# Patient Record
Sex: Female | Born: 1940 | Race: White | Hispanic: No | Smoking: Current some day smoker
Health system: Southern US, Community
[De-identification: ages and names within clinical notes are randomized; demographics above are authoritative.]

## PROBLEM LIST (undated history)

## (undated) DIAGNOSIS — I10 Essential (primary) hypertension: Secondary | ICD-10-CM

## (undated) DIAGNOSIS — G8929 Other chronic pain: Secondary | ICD-10-CM

## (undated) DIAGNOSIS — A0472 Enterocolitis due to Clostridium difficile, not specified as recurrent: Secondary | ICD-10-CM

## (undated) DIAGNOSIS — Z87891 Personal history of nicotine dependence: Secondary | ICD-10-CM

## (undated) DIAGNOSIS — R4182 Altered mental status, unspecified: Secondary | ICD-10-CM

## (undated) DIAGNOSIS — D649 Anemia, unspecified: Secondary | ICD-10-CM

## (undated) DIAGNOSIS — E785 Hyperlipidemia, unspecified: Secondary | ICD-10-CM

## (undated) DIAGNOSIS — K5792 Diverticulitis of intestine, part unspecified, without perforation or abscess without bleeding: Secondary | ICD-10-CM

## (undated) DIAGNOSIS — Z9889 Other specified postprocedural states: Secondary | ICD-10-CM

## (undated) DIAGNOSIS — Z8659 Personal history of other mental and behavioral disorders: Secondary | ICD-10-CM

## (undated) DIAGNOSIS — K219 Gastro-esophageal reflux disease without esophagitis: Secondary | ICD-10-CM

## (undated) DIAGNOSIS — Z8669 Personal history of other diseases of the nervous system and sense organs: Secondary | ICD-10-CM

## (undated) DIAGNOSIS — I2510 Atherosclerotic heart disease of native coronary artery without angina pectoris: Secondary | ICD-10-CM

## (undated) DIAGNOSIS — M545 Low back pain, unspecified: Secondary | ICD-10-CM

## (undated) DIAGNOSIS — M549 Dorsalgia, unspecified: Secondary | ICD-10-CM

## (undated) DIAGNOSIS — Z8781 Personal history of (healed) traumatic fracture: Secondary | ICD-10-CM

## (undated) DIAGNOSIS — M76899 Other specified enthesopathies of unspecified lower limb, excluding foot: Secondary | ICD-10-CM

## (undated) DIAGNOSIS — G629 Polyneuropathy, unspecified: Secondary | ICD-10-CM

## (undated) DIAGNOSIS — N182 Chronic kidney disease, stage 2 (mild): Secondary | ICD-10-CM

## (undated) DIAGNOSIS — F419 Anxiety disorder, unspecified: Secondary | ICD-10-CM

## (undated) DIAGNOSIS — E559 Vitamin D deficiency, unspecified: Secondary | ICD-10-CM

## (undated) DIAGNOSIS — Z9289 Personal history of other medical treatment: Secondary | ICD-10-CM

## (undated) DIAGNOSIS — Q782 Osteopetrosis: Secondary | ICD-10-CM

## (undated) HISTORY — PX: CHOLECYSTECTOMY: SHX55

## (undated) HISTORY — DX: Personal history of other medical treatment: Z92.89

## (undated) HISTORY — DX: Personal history of nicotine dependence: Z87.891

---

## 2004-07-19 ENCOUNTER — Inpatient Hospital Stay: Payer: Self-pay | Admitting: Anesthesiology

## 2004-07-19 ENCOUNTER — Other Ambulatory Visit: Payer: Self-pay

## 2004-08-20 ENCOUNTER — Emergency Department: Payer: Self-pay | Admitting: General Practice

## 2005-12-04 ENCOUNTER — Emergency Department: Payer: Self-pay | Admitting: Emergency Medicine

## 2007-03-04 ENCOUNTER — Emergency Department: Payer: Self-pay | Admitting: Emergency Medicine

## 2007-09-04 ENCOUNTER — Emergency Department: Payer: Self-pay | Admitting: Emergency Medicine

## 2008-01-28 ENCOUNTER — Ambulatory Visit: Payer: Self-pay | Admitting: Family Medicine

## 2008-02-24 ENCOUNTER — Ambulatory Visit: Payer: Self-pay | Admitting: Family Medicine

## 2008-02-25 ENCOUNTER — Ambulatory Visit: Payer: Self-pay | Admitting: Family Medicine

## 2008-05-04 ENCOUNTER — Ambulatory Visit: Payer: Self-pay | Admitting: Gastroenterology

## 2008-08-22 ENCOUNTER — Emergency Department: Payer: Self-pay | Admitting: Emergency Medicine

## 2008-11-01 ENCOUNTER — Emergency Department: Payer: Self-pay | Admitting: Emergency Medicine

## 2008-12-05 ENCOUNTER — Emergency Department: Payer: Self-pay | Admitting: Internal Medicine

## 2009-11-15 ENCOUNTER — Ambulatory Visit: Payer: Self-pay | Admitting: Unknown Physician Specialty

## 2010-08-01 ENCOUNTER — Ambulatory Visit: Payer: Self-pay | Admitting: Unknown Physician Specialty

## 2010-08-04 ENCOUNTER — Ambulatory Visit: Payer: Self-pay | Admitting: Physical Medicine and Rehabilitation

## 2010-11-07 ENCOUNTER — Other Ambulatory Visit: Payer: Self-pay | Admitting: Unknown Physician Specialty

## 2010-12-04 ENCOUNTER — Ambulatory Visit: Payer: Self-pay | Admitting: Gastroenterology

## 2010-12-18 ENCOUNTER — Ambulatory Visit: Payer: Self-pay | Admitting: Gastroenterology

## 2011-03-31 ENCOUNTER — Emergency Department: Payer: Self-pay | Admitting: *Deleted

## 2012-01-30 ENCOUNTER — Observation Stay: Payer: Self-pay | Admitting: Internal Medicine

## 2012-01-30 LAB — URINALYSIS, COMPLETE
Bilirubin,UR: NEGATIVE
Glucose,UR: NEGATIVE mg/dL (ref 0–75)
Hyaline Cast: 8
Ketone: NEGATIVE
Ph: 5 (ref 4.5–8.0)
Protein: NEGATIVE
Specific Gravity: 1.005 (ref 1.003–1.030)
Squamous Epithelial: 4

## 2012-01-30 LAB — COMPREHENSIVE METABOLIC PANEL
Albumin: 3.5 g/dL (ref 3.4–5.0)
Alkaline Phosphatase: 199 U/L — ABNORMAL HIGH (ref 50–136)
Anion Gap: 6 — ABNORMAL LOW (ref 7–16)
BUN: 16 mg/dL (ref 7–18)
Bilirubin,Total: 0.2 mg/dL (ref 0.2–1.0)
Creatinine: 1.38 mg/dL — ABNORMAL HIGH (ref 0.60–1.30)
Glucose: 77 mg/dL (ref 65–99)
Osmolality: 279 (ref 275–301)
SGPT (ALT): 8 U/L — ABNORMAL LOW (ref 12–78)
Sodium: 140 mmol/L (ref 136–145)
Total Protein: 6.7 g/dL (ref 6.4–8.2)

## 2012-01-30 LAB — CBC WITH DIFFERENTIAL/PLATELET
Basophil #: 0 10*3/uL (ref 0.0–0.1)
Eosinophil #: 0.1 10*3/uL (ref 0.0–0.7)
HCT: 31.2 % — ABNORMAL LOW (ref 35.0–47.0)
HGB: 10.3 g/dL — ABNORMAL LOW (ref 12.0–16.0)
Lymphocyte #: 1 10*3/uL (ref 1.0–3.6)
Lymphocyte %: 20.4 %
MCV: 93 fL (ref 80–100)
Monocyte #: 0.4 x10 3/mm (ref 0.2–0.9)
Neutrophil #: 3.2 10*3/uL (ref 1.4–6.5)
Neutrophil %: 68.5 %
RBC: 3.36 10*6/uL — ABNORMAL LOW (ref 3.80–5.20)
RDW: 14.8 % — ABNORMAL HIGH (ref 11.5–14.5)
WBC: 4.7 10*3/uL (ref 3.6–11.0)

## 2012-01-30 LAB — TROPONIN I: Troponin-I: <0.02 ng/mL

## 2012-01-30 LAB — CK TOTAL AND CKMB (NOT AT ARMC): CK-MB: <0.5 ng/mL — ABNORMAL LOW (ref 0.5–3.6)

## 2012-01-31 LAB — CBC WITH DIFFERENTIAL/PLATELET
Eosinophil #: 0.1 10*3/uL (ref 0.0–0.7)
Eosinophil %: 1.7 %
Lymphocyte %: 26.2 %
MCH: 31.7 pg (ref 26.0–34.0)
Monocyte #: 0.4 x10 3/mm (ref 0.2–0.9)
Neutrophil %: 62.2 %
Platelet: 136 10*3/uL — ABNORMAL LOW (ref 150–440)
RBC: 3.14 10*6/uL — ABNORMAL LOW (ref 3.80–5.20)
WBC: 4.5 10*3/uL (ref 3.6–11.0)

## 2012-01-31 LAB — BASIC METABOLIC PANEL
Anion Gap: 5 — ABNORMAL LOW (ref 7–16)
BUN: 13 mg/dL (ref 7–18)
Calcium, Total: 8.7 mg/dL (ref 8.5–10.1)
Chloride: 110 mmol/L — ABNORMAL HIGH (ref 98–107)
Co2: 27 mmol/L (ref 21–32)
Glucose: 72 mg/dL (ref 65–99)
Osmolality: 282 (ref 275–301)
Potassium: 4.7 mmol/L (ref 3.5–5.1)
Sodium: 142 mmol/L (ref 136–145)

## 2012-01-31 LAB — LIPID PANEL
Cholesterol: 149 mg/dL (ref 0–200)
HDL Cholesterol: 58 mg/dL (ref 40–60)

## 2012-02-05 ENCOUNTER — Inpatient Hospital Stay: Payer: Self-pay | Admitting: Internal Medicine

## 2012-02-05 LAB — DIFFERENTIAL
Basophil #: 0.1 10*3/uL (ref 0.0–0.1)
Basophil %: 1.4 %
Eosinophil #: 0 10*3/uL (ref 0.0–0.7)
Lymphocyte #: 1.1 10*3/uL (ref 1.0–3.6)
Lymphocyte %: 19.2 %
Monocyte #: 0.4 x10 3/mm (ref 0.2–0.9)
Monocyte %: 6.5 %
Neutrophil #: 4.3 10*3/uL (ref 1.4–6.5)

## 2012-02-05 LAB — GLUCOSE, CSF: Glucose, CSF: 49 mg/dL (ref 40–75)

## 2012-02-05 LAB — CSF CELL COUNT WITH DIFFERENTIAL
CSF Tube #: 1
CSF Tube #: 3
Eosinophil: 0 %
Lymphocytes: 10 %
Lymphocytes: 19 %
Monocytes/Macrophages: 4 %
Monocytes/Macrophages: 6 %
Other Cells: 0 %
RBC (CSF): 10643 /mm3
WBC (CSF): 141 /mm3

## 2012-02-05 LAB — URINALYSIS, COMPLETE
Bilirubin,UR: NEGATIVE
Glucose,UR: NEGATIVE mg/dL (ref 0–75)
Hyaline Cast: 4
Nitrite: NEGATIVE
Ph: 6 (ref 4.5–8.0)
Protein: NEGATIVE
RBC,UR: 6 /HPF (ref 0–5)
Squamous Epithelial: NONE SEEN
WBC UR: 1 /HPF (ref 0–5)

## 2012-02-05 LAB — COMPREHENSIVE METABOLIC PANEL
Albumin: 3.6 g/dL (ref 3.4–5.0)
Alkaline Phosphatase: 198 U/L — ABNORMAL HIGH (ref 50–136)
Bilirubin,Total: 0.3 mg/dL (ref 0.2–1.0)
Calcium, Total: 9.5 mg/dL (ref 8.5–10.1)
Chloride: 108 mmol/L — ABNORMAL HIGH (ref 98–107)
Co2: 23 mmol/L (ref 21–32)
Creatinine: 1.24 mg/dL (ref 0.60–1.30)
EGFR (Non-African Amer.): 44 — ABNORMAL LOW
Osmolality: 287 (ref 275–301)
Potassium: 3.4 mmol/L — ABNORMAL LOW (ref 3.5–5.1)
SGPT (ALT): 8 U/L — ABNORMAL LOW (ref 12–78)
Sodium: 143 mmol/L (ref 136–145)

## 2012-02-05 LAB — CBC
HGB: 11.8 g/dL — ABNORMAL LOW (ref 12.0–16.0)
MCH: 30.5 pg (ref 26.0–34.0)
MCHC: 33.2 g/dL (ref 32.0–36.0)
Platelet: 207 10*3/uL (ref 150–440)
RBC: 3.87 10*6/uL (ref 3.80–5.20)
RDW: 14.7 % — ABNORMAL HIGH (ref 11.5–14.5)
WBC: 5.9 10*3/uL (ref 3.6–11.0)

## 2012-02-05 LAB — TROPONIN I: Troponin-I: <0.02 ng/mL

## 2012-02-05 LAB — PROTEIN, CSF: Protein, CSF: 103 mg/dL — ABNORMAL HIGH (ref 15–45)

## 2012-02-05 LAB — MAGNESIUM: Magnesium: 2.5 mg/dL — ABNORMAL HIGH

## 2012-02-06 LAB — CBC WITH DIFFERENTIAL/PLATELET
Basophil #: 0 10*3/uL (ref 0.0–0.1)
Eosinophil #: 0 10*3/uL (ref 0.0–0.7)
Lymphocyte %: 8.1 %
MCHC: 33 g/dL (ref 32.0–36.0)
Monocyte %: 2.4 %
Neutrophil %: 88.9 %
Platelet: 172 10*3/uL (ref 150–440)
RDW: 14.6 % — ABNORMAL HIGH (ref 11.5–14.5)
WBC: 3.4 10*3/uL — ABNORMAL LOW (ref 3.6–11.0)

## 2012-02-06 LAB — COMPREHENSIVE METABOLIC PANEL
Alkaline Phosphatase: 150 U/L — ABNORMAL HIGH (ref 50–136)
Anion Gap: 9 (ref 7–16)
Calcium, Total: 8.2 mg/dL — ABNORMAL LOW (ref 8.5–10.1)
Co2: 23 mmol/L (ref 21–32)
EGFR (Non-African Amer.): >60
Osmolality: 291 (ref 275–301)
Potassium: 4.2 mmol/L (ref 3.5–5.1)
Sodium: 144 mmol/L (ref 136–145)

## 2012-02-06 LAB — DRUG SCREEN, URINE
Barbiturates, Ur Screen: NEGATIVE (ref ?–200)
Benzodiazepine, Ur Scrn: NEGATIVE (ref ?–200)
Cannabinoid 50 Ng, Ur ~~LOC~~: NEGATIVE (ref ?–50)
Cocaine Metabolite,Ur ~~LOC~~: NEGATIVE (ref ?–300)
MDMA (Ecstasy)Ur Screen: NEGATIVE (ref ?–500)
Opiate, Ur Screen: NEGATIVE (ref ?–300)
Phencyclidine (PCP) Ur S: NEGATIVE (ref ?–25)

## 2012-02-08 ENCOUNTER — Inpatient Hospital Stay: Payer: Self-pay | Admitting: Student

## 2012-02-08 LAB — URINALYSIS, COMPLETE
Bacteria: NONE SEEN
Bilirubin,UR: NEGATIVE
Ph: 5 (ref 4.5–8.0)
Protein: NEGATIVE
RBC,UR: 2 /HPF (ref 0–5)
Specific Gravity: 1.01 (ref 1.003–1.030)
Squamous Epithelial: NONE SEEN

## 2012-02-08 LAB — COMPREHENSIVE METABOLIC PANEL
BUN: 11 mg/dL (ref 7–18)
Bilirubin,Total: 0.3 mg/dL (ref 0.2–1.0)
Chloride: 111 mmol/L — ABNORMAL HIGH (ref 98–107)
Co2: 24 mmol/L (ref 21–32)
EGFR (African American): >60
EGFR (Non-African Amer.): >60
Glucose: 106 mg/dL — ABNORMAL HIGH (ref 65–99)
SGOT(AST): 15 U/L (ref 15–37)
SGPT (ALT): 8 U/L — ABNORMAL LOW (ref 12–78)
Total Protein: 5.6 g/dL — ABNORMAL LOW (ref 6.4–8.2)

## 2012-02-08 LAB — CBC
HCT: 30.1 % — ABNORMAL LOW (ref 35.0–47.0)
MCH: 30.6 pg (ref 26.0–34.0)
MCHC: 33.5 g/dL (ref 32.0–36.0)
MCV: 92 fL (ref 80–100)
Platelet: 173 10*3/uL (ref 150–440)
RDW: 14.4 % (ref 11.5–14.5)
WBC: 6.7 10*3/uL (ref 3.6–11.0)

## 2012-02-08 LAB — TROPONIN I: Troponin-I: 0.04 ng/mL

## 2012-02-08 LAB — CSF CULTURE

## 2012-02-08 LAB — FOLATE: Folic Acid: 16.8 ng/mL (ref 3.1–100.0)

## 2012-02-09 ENCOUNTER — Ambulatory Visit: Payer: Self-pay | Admitting: Orthopaedic Surgery

## 2012-02-09 LAB — CBC WITH DIFFERENTIAL/PLATELET
Basophil %: 0.8 %
Eosinophil #: 0.1 10*3/uL (ref 0.0–0.7)
Eosinophil %: 1.3 %
HCT: 29.4 % — ABNORMAL LOW (ref 35.0–47.0)
HGB: 9.5 g/dL — ABNORMAL LOW (ref 12.0–16.0)
Lymphocyte %: 20 %
MCHC: 32.3 g/dL (ref 32.0–36.0)
Monocyte %: 9.7 %
Neutrophil #: 3.1 10*3/uL (ref 1.4–6.5)
RBC: 3.19 10*6/uL — ABNORMAL LOW (ref 3.80–5.20)
WBC: 4.5 10*3/uL (ref 3.6–11.0)

## 2012-02-09 LAB — COMPREHENSIVE METABOLIC PANEL
Albumin: 2.5 g/dL — ABNORMAL LOW (ref 3.4–5.0)
Alkaline Phosphatase: 135 U/L (ref 50–136)
Anion Gap: 7 (ref 7–16)
BUN: 8 mg/dL (ref 7–18)
Calcium, Total: 8.2 mg/dL — ABNORMAL LOW (ref 8.5–10.1)
Chloride: 109 mmol/L — ABNORMAL HIGH (ref 98–107)
Creatinine: 0.81 mg/dL (ref 0.60–1.30)
EGFR (African American): >60
Glucose: 104 mg/dL — ABNORMAL HIGH (ref 65–99)
Osmolality: 285 (ref 275–301)
Potassium: 3.5 mmol/L (ref 3.5–5.1)
SGOT(AST): 14 U/L — ABNORMAL LOW (ref 15–37)
SGPT (ALT): 7 U/L — ABNORMAL LOW (ref 12–78)
Sodium: 144 mmol/L (ref 136–145)
Total Protein: 5 g/dL — ABNORMAL LOW (ref 6.4–8.2)

## 2012-02-09 LAB — AMMONIA: Ammonia, Plasma: <25 mcmol/L (ref 11–32)

## 2012-02-10 LAB — RAPID HIV-1/2 QL/CONFIRM: HIV-1/2,Rapid Ql: NEGATIVE

## 2012-02-10 LAB — BASIC METABOLIC PANEL WITH GFR
Anion Gap: 7
BUN: 6 mg/dL — ABNORMAL LOW
Calcium, Total: 8.6 mg/dL
Chloride: 108 mmol/L — ABNORMAL HIGH
Co2: 28 mmol/L
Creatinine: 0.73 mg/dL
EGFR (African American): >60
EGFR (Non-African Amer.): >60
Glucose: 112 mg/dL — ABNORMAL HIGH
Osmolality: 283
Potassium: 3.5 mmol/L
Sodium: 143 mmol/L

## 2012-02-11 LAB — CBC WITH DIFFERENTIAL/PLATELET
Basophil #: 0.1 10*3/uL (ref 0.0–0.1)
Basophil %: 1.1 %
Eosinophil #: 0.1 10*3/uL (ref 0.0–0.7)
Eosinophil %: 0.9 %
HCT: 32.2 % — ABNORMAL LOW (ref 35.0–47.0)
HGB: 10.8 g/dL — ABNORMAL LOW (ref 12.0–16.0)
Lymphocyte #: 0.7 10*3/uL — ABNORMAL LOW (ref 1.0–3.6)
Lymphocyte %: 10.6 %
MCH: 31.2 pg (ref 26.0–34.0)
MCHC: 33.7 g/dL (ref 32.0–36.0)
MCV: 93 fL (ref 80–100)
Monocyte #: 0.6 x10 3/mm (ref 0.2–0.9)
Monocyte %: 10.2 %
Neutrophil #: 4.9 10*3/uL (ref 1.4–6.5)
Neutrophil %: 77.2 %
Platelet: 177 10*3/uL (ref 150–440)
RBC: 3.47 10*6/uL — ABNORMAL LOW (ref 3.80–5.20)
RDW: 14.2 % (ref 11.5–14.5)
WBC: 6.3 10*3/uL (ref 3.6–11.0)

## 2012-02-11 LAB — SEDIMENTATION RATE: Erythrocyte Sed Rate: 12 mm/hr (ref 0–30)

## 2012-02-12 LAB — CULTURE, BLOOD (SINGLE)

## 2012-02-14 LAB — CULTURE, BLOOD (SINGLE)

## 2012-07-20 ENCOUNTER — Emergency Department: Payer: Self-pay | Admitting: Emergency Medicine

## 2012-07-20 LAB — COMPREHENSIVE METABOLIC PANEL
Albumin: 3.7 g/dL (ref 3.4–5.0)
Anion Gap: 6 — ABNORMAL LOW (ref 7–16)
BUN: 21 mg/dL — ABNORMAL HIGH (ref 7–18)
Bilirubin,Total: 0.2 mg/dL (ref 0.2–1.0)
Co2: 21 mmol/L (ref 21–32)
Creatinine: 1.4 mg/dL — ABNORMAL HIGH (ref 0.60–1.30)
EGFR (African American): 44 — ABNORMAL LOW
Potassium: 3.8 mmol/L (ref 3.5–5.1)
SGPT (ALT): 14 U/L (ref 12–78)
Sodium: 144 mmol/L (ref 136–145)

## 2012-07-20 LAB — URINALYSIS, COMPLETE
Glucose,UR: NEGATIVE mg/dL (ref 0–75)
Leukocyte Esterase: NEGATIVE
Nitrite: NEGATIVE
RBC,UR: 2 /HPF (ref 0–5)
Specific Gravity: 1.021 (ref 1.003–1.030)
Squamous Epithelial: 1

## 2012-07-20 LAB — CBC
HCT: 32.4 % — ABNORMAL LOW (ref 35.0–47.0)
HGB: 10.5 g/dL — ABNORMAL LOW (ref 12.0–16.0)
MCH: 30.7 pg (ref 26.0–34.0)
Platelet: 162 10*3/uL (ref 150–440)
RBC: 3.43 10*6/uL — ABNORMAL LOW (ref 3.80–5.20)
RDW: 15.7 % — ABNORMAL HIGH (ref 11.5–14.5)
WBC: 7.9 10*3/uL (ref 3.6–11.0)

## 2012-07-20 LAB — TROPONIN I: Troponin-I: <0.02 ng/mL

## 2012-07-24 ENCOUNTER — Ambulatory Visit: Payer: Self-pay | Admitting: Gastroenterology

## 2012-08-12 ENCOUNTER — Inpatient Hospital Stay: Payer: Self-pay | Admitting: Internal Medicine

## 2012-08-12 LAB — COMPREHENSIVE METABOLIC PANEL
Albumin: 3.9 g/dL (ref 3.4–5.0)
Alkaline Phosphatase: 93 U/L (ref 50–136)
Anion Gap: 11 (ref 7–16)
Calcium, Total: 8.9 mg/dL (ref 8.5–10.1)
Chloride: 112 mmol/L — ABNORMAL HIGH (ref 98–107)
Creatinine: 1.86 mg/dL — ABNORMAL HIGH (ref 0.60–1.30)

## 2012-08-12 LAB — URINALYSIS, COMPLETE
Bacteria: NONE SEEN
Bilirubin,UR: NEGATIVE
Glucose,UR: NEGATIVE mg/dL (ref 0–75)
Hyaline Cast: 8
Nitrite: NEGATIVE
Protein: NEGATIVE
Specific Gravity: 1.02 (ref 1.003–1.030)

## 2012-08-12 LAB — DRUG SCREEN, URINE

## 2012-08-12 LAB — CBC
MCH: 28.3 pg (ref 26.0–34.0)
MCHC: 30.6 g/dL — ABNORMAL LOW (ref 32.0–36.0)
Platelet: 193 10*3/uL (ref 150–440)

## 2012-08-12 LAB — PROTIME-INR
INR: 1.1
Prothrombin Time: 14.1 secs (ref 11.5–14.7)

## 2012-08-12 LAB — ETHANOL: Ethanol %: <0.003 % (ref 0.000–0.080)

## 2012-08-12 LAB — APTT: Activated PTT: 26.9 secs (ref 23.6–35.9)

## 2012-08-13 LAB — CBC WITH DIFFERENTIAL/PLATELET
Eosinophil #: 0 10*3/uL (ref 0.0–0.7)
HCT: 29 % — ABNORMAL LOW (ref 35.0–47.0)
HGB: 9.4 g/dL — ABNORMAL LOW (ref 12.0–16.0)
Lymphocyte %: 21 %
MCH: 29.9 pg (ref 26.0–34.0)
MCHC: 32.2 g/dL (ref 32.0–36.0)
Monocyte #: 0.5 x10 3/mm (ref 0.2–0.9)
Monocyte %: 10.7 %
Neutrophil #: 3 10*3/uL (ref 1.4–6.5)
Platelet: 150 10*3/uL (ref 150–440)
RBC: 3.13 10*6/uL — ABNORMAL LOW (ref 3.80–5.20)
RDW: 15.5 % — ABNORMAL HIGH (ref 11.5–14.5)
WBC: 4.5 10*3/uL (ref 3.6–11.0)

## 2012-08-13 LAB — BASIC METABOLIC PANEL
Anion Gap: 11 (ref 7–16)
BUN: 44 mg/dL — ABNORMAL HIGH (ref 7–18)
Co2: 18 mmol/L — ABNORMAL LOW (ref 21–32)
Creatinine: 1.01 mg/dL (ref 0.60–1.30)
EGFR (Non-African Amer.): 57 — ABNORMAL LOW
Sodium: 146 mmol/L — ABNORMAL HIGH (ref 136–145)

## 2012-08-14 LAB — CBC WITH DIFFERENTIAL/PLATELET
Eosinophil #: 0.1 10*3/uL (ref 0.0–0.7)
Eosinophil %: 1.1 %
HCT: 28.4 % — ABNORMAL LOW (ref 35.0–47.0)
HGB: 9.3 g/dL — ABNORMAL LOW (ref 12.0–16.0)
Lymphocyte %: 22.5 %
MCH: 30.2 pg (ref 26.0–34.0)
MCHC: 32.8 g/dL (ref 32.0–36.0)
Monocyte #: 0.5 x10 3/mm (ref 0.2–0.9)
Neutrophil #: 3.3 10*3/uL (ref 1.4–6.5)
Platelet: 142 10*3/uL — ABNORMAL LOW (ref 150–440)
RBC: 3.08 10*6/uL — ABNORMAL LOW (ref 3.80–5.20)
RDW: 15.2 % — ABNORMAL HIGH (ref 11.5–14.5)
WBC: 5.1 10*3/uL (ref 3.6–11.0)

## 2012-08-14 LAB — BASIC METABOLIC PANEL
Anion Gap: 7 (ref 7–16)
Calcium, Total: 8 mg/dL — ABNORMAL LOW (ref 8.5–10.1)
EGFR (African American): >60
Osmolality: 282 (ref 275–301)
Sodium: 140 mmol/L (ref 136–145)

## 2012-08-15 LAB — FOLATE: Folic Acid: 17.3 ng/mL (ref 3.1–100.0)

## 2012-08-17 LAB — FERRITIN: Ferritin (ARMC): 3 ng/mL — ABNORMAL LOW (ref 8–388)

## 2012-08-17 LAB — IRON AND TIBC
Iron Bind.Cap.(Total): 263 ug/dL (ref 250–450)
Iron Saturation: 10 %

## 2012-08-17 LAB — URINE CULTURE

## 2012-08-18 LAB — CULTURE, BLOOD (SINGLE)

## 2012-09-30 ENCOUNTER — Ambulatory Visit: Payer: Self-pay | Admitting: Gastroenterology

## 2012-10-01 LAB — PATHOLOGY REPORT

## 2013-04-14 ENCOUNTER — Ambulatory Visit: Payer: Self-pay | Admitting: Physician Assistant

## 2013-10-06 ENCOUNTER — Emergency Department: Payer: Self-pay | Admitting: Emergency Medicine

## 2013-10-06 LAB — URINALYSIS, COMPLETE
BACTERIA: NONE SEEN
Bilirubin,UR: NEGATIVE
Glucose,UR: NEGATIVE mg/dL (ref 0–75)
Nitrite: NEGATIVE
PROTEIN: NEGATIVE
Ph: 5 (ref 4.5–8.0)
RBC,UR: 9 /HPF (ref 0–5)
Specific Gravity: 1.027 (ref 1.003–1.030)
Squamous Epithelial: 1
WBC UR: 2 /HPF (ref 0–5)

## 2013-10-06 LAB — CBC
HCT: 38.4 % (ref 35.0–47.0)
HGB: 12.5 g/dL (ref 12.0–16.0)
MCH: 31.1 pg (ref 26.0–34.0)
MCHC: 32.6 g/dL (ref 32.0–36.0)
MCV: 95 fL (ref 80–100)
Platelet: 202 10*3/uL (ref 150–440)
RBC: 4.02 10*6/uL (ref 3.80–5.20)
RDW: 14.9 % — AB (ref 11.5–14.5)
WBC: 6.4 10*3/uL (ref 3.6–11.0)

## 2013-10-06 LAB — COMPREHENSIVE METABOLIC PANEL
ALK PHOS: 122 U/L — AB
ANION GAP: 8 (ref 7–16)
Albumin: 3.9 g/dL (ref 3.4–5.0)
BUN: 27 mg/dL — ABNORMAL HIGH (ref 7–18)
Bilirubin,Total: 0.4 mg/dL (ref 0.2–1.0)
Calcium, Total: 8.9 mg/dL (ref 8.5–10.1)
Chloride: 117 mmol/L — ABNORMAL HIGH (ref 98–107)
Co2: 20 mmol/L — ABNORMAL LOW (ref 21–32)
Creatinine: 1.11 mg/dL (ref 0.60–1.30)
EGFR (African American): 58 — ABNORMAL LOW
EGFR (Non-African Amer.): 50 — ABNORMAL LOW
Glucose: 86 mg/dL (ref 65–99)
Osmolality: 293 (ref 275–301)
Potassium: 3.6 mmol/L (ref 3.5–5.1)
SGOT(AST): 17 U/L (ref 15–37)
SGPT (ALT): 8 U/L — ABNORMAL LOW (ref 12–78)
Sodium: 145 mmol/L (ref 136–145)
TOTAL PROTEIN: 7.3 g/dL (ref 6.4–8.2)

## 2013-10-06 LAB — TROPONIN I

## 2013-10-16 ENCOUNTER — Inpatient Hospital Stay: Payer: Self-pay | Admitting: Internal Medicine

## 2013-10-16 DIAGNOSIS — R4182 Altered mental status, unspecified: Secondary | ICD-10-CM | POA: Insufficient documentation

## 2013-10-16 LAB — COMPREHENSIVE METABOLIC PANEL
ALBUMIN: 3.7 g/dL (ref 3.4–5.0)
ANION GAP: 3 — AB (ref 7–16)
AST: 19 U/L (ref 15–37)
Alkaline Phosphatase: 96 U/L
BILIRUBIN TOTAL: 0.3 mg/dL (ref 0.2–1.0)
BUN: 24 mg/dL — ABNORMAL HIGH (ref 7–18)
CALCIUM: 8.6 mg/dL (ref 8.5–10.1)
Chloride: 125 mmol/L — ABNORMAL HIGH (ref 98–107)
Co2: 20 mmol/L — ABNORMAL LOW (ref 21–32)
Creatinine: 1.41 mg/dL — ABNORMAL HIGH (ref 0.60–1.30)
EGFR (African American): 44 — ABNORMAL LOW
EGFR (Non-African Amer.): 38 — ABNORMAL LOW
Glucose: 90 mg/dL (ref 65–99)
Osmolality: 298 (ref 275–301)
POTASSIUM: 4.2 mmol/L (ref 3.5–5.1)
SGPT (ALT): 7 U/L — ABNORMAL LOW (ref 12–78)
Sodium: 148 mmol/L — ABNORMAL HIGH (ref 136–145)
TOTAL PROTEIN: 6.6 g/dL (ref 6.4–8.2)

## 2013-10-16 LAB — URINALYSIS, COMPLETE
BILIRUBIN, UR: NEGATIVE
Bacteria: NONE SEEN
Glucose,UR: NEGATIVE mg/dL (ref 0–75)
NITRITE: NEGATIVE
PROTEIN: NEGATIVE
Ph: 5 (ref 4.5–8.0)
RBC,UR: 3 /HPF (ref 0–5)
SPECIFIC GRAVITY: 1.021 (ref 1.003–1.030)
Squamous Epithelial: <1
WBC UR: 1 /HPF (ref 0–5)

## 2013-10-16 LAB — DRUG SCREEN, URINE

## 2013-10-16 LAB — TSH: THYROID STIMULATING HORM: 0.21 u[IU]/mL — AB

## 2013-10-16 LAB — SALICYLATE LEVEL
Salicylates, Serum: 44 mg/dL
Salicylates, Serum: 33 mg/dL

## 2013-10-16 LAB — CBC
HCT: 37 % (ref 35.0–47.0)
HGB: 11.9 g/dL — AB (ref 12.0–16.0)
MCH: 31.2 pg (ref 26.0–34.0)
MCHC: 32.2 g/dL (ref 32.0–36.0)
MCV: 97 fL (ref 80–100)
PLATELETS: 138 10*3/uL — AB (ref 150–440)
RBC: 3.81 10*6/uL (ref 3.80–5.20)
RDW: 15.1 % — ABNORMAL HIGH (ref 11.5–14.5)
WBC: 4.9 10*3/uL (ref 3.6–11.0)

## 2013-10-16 LAB — ETHANOL
Ethanol %: <0.003 % (ref 0.000–0.080)
Ethanol: <3 mg/dL

## 2013-10-16 LAB — ACETAMINOPHEN LEVEL: Acetaminophen: <2 ug/mL

## 2013-10-17 LAB — BASIC METABOLIC PANEL
Anion Gap: 2 — ABNORMAL LOW (ref 7–16)
BUN: 19 mg/dL — AB (ref 7–18)
CO2: 28 mmol/L (ref 21–32)
Calcium, Total: 8.1 mg/dL — ABNORMAL LOW (ref 8.5–10.1)
Chloride: 115 mmol/L — ABNORMAL HIGH (ref 98–107)
Creatinine: 1.07 mg/dL (ref 0.60–1.30)
EGFR (African American): >60
GFR CALC NON AF AMER: 53 — AB
GLUCOSE: 65 mg/dL (ref 65–99)
OSMOLALITY: 289 (ref 275–301)
POTASSIUM: 3.3 mmol/L — AB (ref 3.5–5.1)
Sodium: 145 mmol/L (ref 136–145)

## 2013-10-17 LAB — CBC WITH DIFFERENTIAL/PLATELET
BASOS ABS: 0 10*3/uL (ref 0.0–0.1)
Basophil %: 0.5 %
EOS ABS: 0 10*3/uL (ref 0.0–0.7)
Eosinophil %: 1 %
HCT: 33 % — ABNORMAL LOW (ref 35.0–47.0)
HGB: 10.7 g/dL — ABNORMAL LOW (ref 12.0–16.0)
Lymphocyte #: 1.8 10*3/uL (ref 1.0–3.6)
Lymphocyte %: 41.1 %
MCH: 31.2 pg (ref 26.0–34.0)
MCHC: 32.4 g/dL (ref 32.0–36.0)
MCV: 96 fL (ref 80–100)
MONO ABS: 0.4 x10 3/mm (ref 0.2–0.9)
Monocyte %: 9.9 %
Neutrophil #: 2 10*3/uL (ref 1.4–6.5)
Neutrophil %: 47.5 %
PLATELETS: 117 10*3/uL — AB (ref 150–440)
RBC: 3.43 10*6/uL — ABNORMAL LOW (ref 3.80–5.20)
RDW: 14.9 % — AB (ref 11.5–14.5)
WBC: 4.3 10*3/uL (ref 3.6–11.0)

## 2013-10-17 LAB — SALICYLATE LEVEL
Salicylates, Serum: 23.5 mg/dL — ABNORMAL HIGH
Salicylates, Serum: 26.7 mg/dL — ABNORMAL HIGH

## 2013-10-18 LAB — CBC WITH DIFFERENTIAL/PLATELET
BASOS PCT: 0.6 %
Basophil #: 0 10*3/uL (ref 0.0–0.1)
Eosinophil #: 0 10*3/uL (ref 0.0–0.7)
Eosinophil %: 0.9 %
HCT: 32.5 % — ABNORMAL LOW (ref 35.0–47.0)
HGB: 10.6 g/dL — ABNORMAL LOW (ref 12.0–16.0)
LYMPHS ABS: 1.3 10*3/uL (ref 1.0–3.6)
Lymphocyte %: 27.4 %
MCH: 31.5 pg (ref 26.0–34.0)
MCHC: 32.6 g/dL (ref 32.0–36.0)
MCV: 97 fL (ref 80–100)
MONO ABS: 0.5 x10 3/mm (ref 0.2–0.9)
MONOS PCT: 10.2 %
NEUTROS ABS: 2.8 10*3/uL (ref 1.4–6.5)
NEUTROS PCT: 60.9 %
PLATELETS: 102 10*3/uL — AB (ref 150–440)
RBC: 3.36 10*6/uL — ABNORMAL LOW (ref 3.80–5.20)
RDW: 14.7 % — AB (ref 11.5–14.5)
WBC: 4.6 10*3/uL (ref 3.6–11.0)

## 2013-10-18 LAB — BASIC METABOLIC PANEL
ANION GAP: 4 — AB (ref 7–16)
BUN: 14 mg/dL (ref 7–18)
CALCIUM: 8.2 mg/dL — AB (ref 8.5–10.1)
CHLORIDE: 113 mmol/L — AB (ref 98–107)
Co2: 28 mmol/L (ref 21–32)
Creatinine: 1.02 mg/dL (ref 0.60–1.30)
EGFR (African American): >60
EGFR (Non-African Amer.): 56 — ABNORMAL LOW
GLUCOSE: 70 mg/dL (ref 65–99)
OSMOLALITY: 288 (ref 275–301)
POTASSIUM: 3.8 mmol/L (ref 3.5–5.1)
Sodium: 145 mmol/L (ref 136–145)

## 2013-10-18 LAB — SALICYLATE LEVEL: SALICYLATES, SERUM: 5.2 mg/dL — AB

## 2013-10-23 DIAGNOSIS — T39091A Poisoning by salicylates, accidental (unintentional), initial encounter: Secondary | ICD-10-CM | POA: Insufficient documentation

## 2014-08-06 ENCOUNTER — Emergency Department: Payer: Self-pay | Admitting: Student

## 2014-08-06 DIAGNOSIS — R072 Precordial pain: Secondary | ICD-10-CM | POA: Diagnosis not present

## 2014-08-18 DIAGNOSIS — E782 Mixed hyperlipidemia: Secondary | ICD-10-CM | POA: Diagnosis not present

## 2014-08-18 DIAGNOSIS — I10 Essential (primary) hypertension: Secondary | ICD-10-CM | POA: Diagnosis not present

## 2014-08-18 DIAGNOSIS — K219 Gastro-esophageal reflux disease without esophagitis: Secondary | ICD-10-CM | POA: Diagnosis not present

## 2014-08-18 DIAGNOSIS — M810 Age-related osteoporosis without current pathological fracture: Secondary | ICD-10-CM | POA: Diagnosis not present

## 2014-09-14 NOTE — Discharge Summary (Signed)
PATIENT NAME:  Cheryl Hall, Cheryl Hall MR#:  [id number] DATE OF BIRTH:  1940-09-24  DATE OF ADMISSION:  01/30/2012 DATE OF DISCHARGE:  01/31/2012  Please note this is a discharge [place].    PRIMARY COMPLAINT: Chest pain.     DISCHARGE DIAGNOSIS: Chest pain, etiology unknown.   Please refer to dictation by Dr. Vivien Presto on 01/30/2012.   HOSPITAL COURSE: Patient presented with nonradiating chest pain that improved with nitroglycerin. She was admitted for chest pain, rule out. Cardiac enzymes were less than 0.5 x2, however, patient was pending a stress test to rule out acute coronary syndrome or ongoing underlying ischemia but this morning she stated that she was to stressed and did not feel like she would be able to tolerate the stress test today, that she has had them in the past and did not feel like she would be able to walk due to low back pain. When explained that it not to be an exercise stress test she still refused stating that she was hungry and felt nauseated and wanted to go home because she felt very stressed in the hospital environment. When queried about stressing factors or things that could alleviate her stress while she was here she refused to engage in conversation stating "I do not want to argue. I just want to leave. Please take out this IV and let me go home because if you do not take it out I will walk out of here with it." Attempted to engage in conversation and reconcile issues but patient was not amenable. Her nurse, Lenna Sciara, was present during the encounter. Patient signed form AGAINST MEDICAL ADVICE. She was counseled about the risk of possible myocardial infarction as these symptoms could be signs of at least myocardial ischemia in females and she was able to verbalize understanding as well as teach back information provided "I understand what you are telling me. You say I could go out and have a heart attack."  Patient stated that she did not need any medication  refills.   She was advised to call her primary care provider today and schedule follow up for maybe possible outpatient stress test and she verbalized understanding and agreement to do so.   PHYSICAL EXAMINATION: Patient was sitting up in no apparent distress. She was awake, alert, and oriented x3. Also oriented to the situation. Judgment and insight were intact. She did not have any apparent chest pain at this time, just complained of nausea. Abdominal exam was benign. Cardiac exam was within normal limits with normal heart rate. Abdomen, again, was without tenderness. Extremities had no edema. Skin was warm and dry without any areas of edema or erythema.   TIME SPENT: Time spent in counseling 30 minutes.   ____________________________ Samson Frederic, DO aeo:cms D: 01/31/2012 09:01:19 ET T: 02/01/2012 11:39:48 ET JOB#: [id number]  cc: Samson Frederic, DO, <Dictator>    Dreyson Mishkin E Kaileigh Viswanathan DO ELECTRONICALLY SIGNED 02/13/2012 0:14

## 2014-09-14 NOTE — Consult Note (Signed)
Impression: [phone] WF w/ h/o CRI admitted with confusion and recent LP showing increased WBC in CSF.  Her MS is much worse than at discharge last week.  Her confusion appears to be waxing and waning.  She is not lethargic, but certainly is delusional.  Her LP during her last hospitalization showed a bloody tap, but even accounting for that she had increased WBCs.  She did not have meningitis symptoms last hosptalization and does not appear to have them now.  The relatively rapid improvement in her MS last hospitalization made encephalitis unlikely.  I still believe that to be the case. Her CSF cultures were negative.  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  This is very unlikely.  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  Without early initiation of treatment, there is unlikely to be any benefit from treatment. Would look for noninfectious etiologies of her confusion/delerium. MRI showed compression fracture, but no evidence for parameningeal focus of infection.   Electronic Signatures: Tylin Stradley, Heinz Knuckles (MD)  (Signed on 16-Sep-13 15:00)  Authored  Last Updated: 16-Sep-13 15:00 by Tiyonna Sardinha, Heinz Knuckles (MD)

## 2014-09-14 NOTE — Consult Note (Signed)
Brief Consult Note: Diagnosis: L5 Compression fx; bilateral sacral insufficiency fxs.   Patient was seen by consultant.   Consult note dictated.   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 also with chronic LBP; MRI done during this visit indicates 25% compression fx of L5 vertebral body and bilateral sacral insufficiency fxs; pt not complaining of pain; no responding to my questions; follows only some commands;  PE  BUE 4-5/5 strengthen unknown senstation RLE 4-5/5 strength in PF/DF  LLE Pos PF no DF seen; cap refill < 2 secs bilateral unknown sensation BLE reflexes BLE 2+/4 x4 neg clonus bil ankles  A/P 74 yo female with L5 compression fx; bilateral sacral insufficiency fxs Fx appear stable suggest soft corset brace suggest mobilization with PT when able to participate unknown amount of pain so not sure if she is a canidate for kyphoplasty/vertebroplasty.  Electronic Signatures: Laurey Arrow (MD)  (Signed 14-Sep-13 17:07)  Authored: Brief Consult Note   Last Updated: 14-Sep-13 17:07 by Laurey Arrow (MD)

## 2014-09-14 NOTE — Consult Note (Signed)
           Electronic Signatures: Laurey Arrow (MD)  (Signed 15-Sep-13 21:08)  Authored: Brief Consult Note   Last Updated: 15-Sep-13 21:08 by Laurey Arrow (MD)

## 2014-09-14 NOTE — Consult Note (Signed)
PATIENT NAME:  Cheryl Hall, Cheryl Hall MR#:  [id number] DATE OF BIRTH:  [birth year]  DATE OF CONSULTATION:  02/09/2012  REFERRING PHYSICIAN:   CONSULTING PHYSICIAN:  Maryan Char. Kennedy Bucker, MD  CHIEF COMPLAINT: Chronic low back pain and evidence of compression fracture at L5.   HISTORY OF PRESENT ILLNESS: Cheryl Hall is a 74 year old female with a history of chronic back pain, chronic kidney disease, hypertension, hyperlipidemia, sciatica who has been admitted for altered mental status. She was initially admitted several days prior to this admission for confusion and chest pain, however, she left AGAINST MEDICAL ADVICE. She was again admitted several days after leaving [facility] and was evaluated for possible meningitis. She was treated with acyclovir and antibiotics and then was discharged. Again EMS was called. This time on the 13th, yesterday, for being found nonverbal and she was brought in confused. During this admission they have done an MRI scan of her lumbar spine that demonstrated compression fracture at L5 with 25% loss of height as well as sacral insufficiency fractures bilaterally and I was consulted.   PAST MEDICAL HISTORY:  1. Hypertension. 2. Chronic kidney disease. 3. Anemia. 4. Chronic back pain and sciatica. 5. Hyperlipidemia. 6. Multiple vertebral fractures in the past.  7. 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   REVIEW OF SYSTEMS: Complete review of systems was not able to be completed due to the patient's altered mental status.   HOME MEDICATIONS:  1. Valium.  2. Zofran. 3. Nexium. 4. Hydrochlorothiazide/lisinopril.   ALLERGIES: No known drug allergies.   FAMILY HISTORY: Noncontributory.   SOCIAL HISTORY: She lives alone. She is independent with her daily activities prior to this illness. She quit smoking two years ago.   PAST SURGICAL HISTORY: Gallbladder surgery.   PHYSICAL EXAMINATION:  VITAL SIGNS: Temperature 98.2 Fahrenheit, pulse 76,  respirations 18, blood pressure 155/78, pulse oximetry 95% on room air.   GENERAL: She is awake and alert, but does not answer questions. She is sitting up in bed.   CARDIOVASCULAR: Regular rate and rhythm.   RESPIRATORY: Clear anteriorly.  ABDOMEN: Soft.  SKIN: No rash or lesions.  MOTOR: Examination bilateral upper extremities demonstrates 4 to 5/5 strength bilaterally with full range of motion. No evidence of pain. Examination of bilateral lower extremities shows 4 to 5/5 muscle strength on the right with plantar flexion and dorsiflexion. She is a unable to follow my commands in regards to hip and knee evaluation. She is unable to tell me her sensation either bilateral lower extremities. Examination on the left lower extremity shows plantar flexion but no active dorsiflexion. Again, unable to assess sensation in bilateral lower extremities. Capillary refill is less than 2 seconds. She has deep tendon reflexes of 2+/4 bilateral knees and ankles. She has negative clonus bilaterally. Gait not assessed.   LABORATORY, DIAGNOSTIC AND RADIOLOGICAL DATA: She had an MRI scan of her lumbar spine done today, on 09/14, that demonstrates moderate anterior wedge compression of L1 and T10 vertebral bodies. There is also evidence of acute injury to the L5 vertebral body with approximately 25% loss of height. There is also evidence of bilateral sacral insufficiency fractures and right posterior iliac wing fracture likely insufficiency in nature. There is also evidence of multiple degenerative disk disease and facet disease with moderate canal stenosis at L3-L4 and mild canal stenosis at L4-L5.   No x-rays have been done.   ASSESSMENT AND RECOMMENDATIONS: Cheryl Hall is a 74 year old female who has a 25% compression fracture of L5, as well  as bilateral sacral insufficiency fractures and possible right posterior iliac insufficiency fracture. I am unable to assess pain secondary to her altered mental status so I am  not sure if she would be a candidate for kyphoplasty/vertebroplasty at this point. Her fracture appears to be stable in nature. My recommendation would be for physical therapy and a soft corset brace when she is able to follow commands with physical therapy.   These findings in this consultation have been discussed with Dr. Tamala Julian and he agrees with the above noted plan. Thank you for the consultation and please let us know if we can be of any more assistance.   ____________________________ Maryan Char. Kennedy Bucker, MD rdc:cms D: 02/09/2012 17:18:37 ET T: 02/10/2012 08:03:23 ET JOB#: [id number]  cc: Maryan Char. Kennedy Bucker, MD, <Dictator> Laurey Arrow MD ELECTRONICALLY SIGNED 02/19/2012 6:17

## 2014-09-14 NOTE — H&P (Signed)
PATIENT NAME:  Cheryl Hall, Cheryl Hall MR#:  [id number] DATE OF BIRTH:  24-Dec-1940  DATE OF ADMISSION:  02/08/2012  PRIMARY CARE PHYSICIAN: Kem Kays, M.D.   NOTE: History was obtained from patient and her son at bedside. Old records have been reviewed. Her imaging studies and EKG have been reviewed personally. The case was discussed with the ER physician.   CHIEF COMPLAINT: Altered mental status.   HISTORY OF PRESENT ILLNESS: The patient is a 74 year old Caucasian female patient with past medical history of chronic low back pain, chronic kidney disease, hypertension, hyperlipidemia, sciatica, recently discharged yesterday after a work-up for altered mental status and low back pain, presents to the Emergency Room again for altered mental status. The patient on initial contact by EMS was nonverbal, who were called by her son who initially found her unresponsive. Her blood sugar was 56. She got an amp of D50, after which the patient slowly woke up. The patient mentioned that she has not eaten anything, presently is still confused but is more verbal. The son mentions that she looks significantly better than at the time he found her and called EMS.   The patient was recently in the hospital for low back pain, multiple neurological complaints, had an LP done which showed elevated protein of 106, increased WBC with neutrophils, cultures being negative, and was seen by Dr. Clayborn Bigness. Because of low back pain, an MRI of the lumbar spine was recommended, but the patient refused it and was discharged home afebrile, doing significantly better, with normal white count in a stable condition. Presently the patient mentions low back pain but no other concerns.   The patient was discharged home on Valium 5 mg q.i.d. as needed, but the medications the son has brought in from her home does not have the Valium bottle. The patient mentions that she is not aware of Valium but has been using tramadol and used 2 pills in the last 24  hours for her pain.   PAST MEDICAL HISTORY:  1. Hypertension.  2. Chronic kidney disease.   3. Anemia.  4. Chronic back pain.  5. Sciatica.  6. Hyperlipidemia.  7. Multiple vertebral fractures in the past.  8. 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   HOME MEDICATIONS:  1. Valium 5 mg oral times 5 times a day as needed.  2. Zofran 4 mg as needed every 6 hours. 3. Nexium 40 mg oral daily.  4. Hydrochlorothiazide/lisinopril 12.5/10 once daily.   ALLERGIES: No known drug allergies.   FAMILY HISTORY: The patient's sister has hypertension. Mother had some heart disease and a pacemaker placed.   SOCIAL HISTORY: The patient lives alone. Prior to her admission, the patient was independent with her activities of daily living and presently is homebound. She has smoked in the past but quit two years back. No alcohol. No illicit drugs.   CODE STATUS:  FULL CODE.     PAST SURGICAL HISTORY: Gallbladder surgery.   REVIEW OF SYSTEMS: Review of systems is unobtainable as the patient is confused.   PHYSICAL EXAMINATION:  VITAL SIGNS: Temperature 98.3, pulse 66, blood pressure 162/79, saturating 98% on room air.   GENERAL: Thin frail Caucasian female patient lying in bed, comfortable, confused, mild dysarthria.   PSYCHIATRIC: Drowsy and a flat affect, poor judgment.   HEENT: Atraumatic, normocephalic. Oral mucosa dry and pink. No oral ulcers or thrush. External ears and nose normal. Pallor positive. No icterus. Pupils bilaterally equal and reactive to light.  NECK: Supple. No thyromegaly.  No palpable lymph nodes. No neck rigidity. Trachea midline. No carotid bruit or JVD. No lymphadenopathy.   CARDIOVASCULAR: S1, S2, regular rate and rhythm without any murmurs.   RESPIRATORY: Normal work of breathing. Clear to auscultation on both sides.   GASTROINTESTINAL: Soft abdomen, tenderness on deep palpation. Bowel sounds present. No hepatosplenomegaly palpable. No scars.    GENITOURINARY: No CVA tenderness or bladder distention.   SKIN: Warm and extremely dry. No petechiae, rash, ulcers.   MUSCULOSKELETAL: Has tenderness all along the spine. A dressing over the LP site. No erythema, tenderness or swelling noticed.   NEUROLOGICAL: Generalized 4/5 motor strength all over. Cranial nerves II through XII intact. Sensation to fine touch intact all over.   LABORATORY, DIAGNOSTIC AND RADIOLOGIC DATA: Sodium 146, chloride 111, potassium 3.6, glucose 106, BUN 11, creatinine 0.87, albumin 2.9. AST, alkaline phosphatase normal. WBC 6.7, hemoglobin 10.1, platelets 173. Urinalysis shows no WBC or bacteria. EKG shows normal sinus rhythm. No acute ST-T wave changes.   ASSESSMENT AND PLAN:  1. Acute encephalopathy: Etiology is unclear at this time. The patient's glucose was only 56, not extremely hypoglycemic. We will put her on Accu-Cheks frequently to see if she has any baseline hypoglycemic episodes. We will also check ammonia, B12 and folate. We will also get blood cultures and urine cultures to rule out any infection. We will get MRI of the lumbar spine which was recommended previously by Dr. Clayborn Bigness for abdominal pain and chronic back pain to look for any infection or lesions. The patient will be on fall precautions, presently is feeling significantly better. She will be monitored. A major component could also be using excess medications as the patient's Valium is missing at this time. Although the patient denies using any Valium, we will hold off on tramadol and Valium. The patient has a few pills of Neurontin which were not there on previous H and P which will also be held.  2. Hypertension, uncontrolled: The patient has missed her medications at home and she was acute encephalopathic. We will restart medications.  3. Dehydration secondary to decreased intake: Start her on IV fluids and closely monitor ins and outs.  4. Chronic kidney disease 3, stable.  5. Anemia, stable.   6. Chronic pain in the low back, await MRI results.  7. Deep vein thrombosis prophylaxis with Lovenox.   CODE STATUS:  FULL CODE.     TIME SPENT: Time spent today on this case was more than 75 minutes with more than 50% time spent in coordination of care.  ____________________________ Leia Alf. Marsh Heckler, MD srs:cbb D: 02/08/2012 17:33:43 ET T: 02/08/2012 18:32:01 ET JOB#: [id number]  cc: Alveta Heimlich R. Darvin Neighbours, MD, <Dictator> Lorenza Evangelist, MD Neita Carp MD ELECTRONICALLY SIGNED 02/08/2012 23:02

## 2014-09-14 NOTE — Discharge Summary (Signed)
PATIENT NAME:  LONETTA, BLASSINGAME MR#:  [id number] DATE OF BIRTH:  February 23, 1941  DATE OF ADMISSION:  02/08/2012 DATE OF DISCHARGE:  02/13/2012  CHIEF COMPLAINT: Altered mental status.  PRIMARY CARE PHYSICIAN: Kem Kays, MD   CONSULTANTS:  1. Eulogio Ditch, MD - Neurology. 2. Lanae Boast, MD - Infectious Disease.  3. Eino Farber, MD - Orthopedics.   CHIEF COMPLAINT: Altered mental status.   DISCHARGE DIAGNOSES:  1. Delirium from medications versus less likely viral encephalitis.  2. Hypoglycemia.  3. Dehydration.  4. L5 vertebral fractures.  5. Hypertension.  6. Chronic kidney disease. 7. Anemia.  8. History of sciatica.  9. Chronic back pain.  10. Multiple vertebral fractures in the past.  11. History of right lower extremity weakness secondary to neuropathy.   DISCHARGE MEDICATIONS:  1. Hydrochlorothiazide/lisinopril 12.5/10 mg one tab daily.  2. Nexium 40 mg daily.  3. BC 845 mg/65 mg oral powder for reconstitution one packet 3 to 4 times daily.  4. Tylenol 500 mg 1 tab once a day as needed for pain or headache.  5. Vitamin B12 1 tab daily.  6. Vitamin D3 1 tab daily.  7. Simvastatin 40 mg daily.   NOTE: Please stop taking Valium, Tramadol, and Zofran.   DIET: Low sodium.   ACTIVITY: As tolerated.   DISCHARGE FOLLOWUP: Please followup with Dr. Sabra Heck your  primary care physician in 1 to 2 weeks. Please follow with the orthopedic doctor within 1 to 2 weeks.   DISPOSITION: Home with home health.   CODE STATUS: FULL CODE.   HISTORY OF PRESENT ILLNESS: For full details of the history and physical, please see the dictation on 02/08/2012 by Dr. Hillary Bow, but briefly this is a 74 year old female with several hospitalizations recently who was discharged from the hospital several days before admission and came back with altered mental status and was found to have hypoglycemia with blood sugars of 56 and was given an amp of D50. The patient had confusion and  therefore was admitted to the hospital. During the last hospitalization, the patient underwent studies including a LP for multiple neurological complaints and noted to have elevated protein of 106 and increased WBC with neutrophils and the cultures have been negative. The patient had normal WBC, was stable, and was discharged last admission. She was of note discharged on Valium and tramadol.   SIGNIFICANT LABS AND IMAGING: Initial sodium 146, initial glucose 134, and creatinine was 0.87. LFTs: Total bilirubin 0.4, alkaline phosphatase 144, ALT 8, AST 18, and albumin 2.9. Troponin was negative. Urine toxicology was negative on 02/06/2012, of note.  WBC on arrival was 6.7 and hemoglobin on arrival was 10.1. ESR 12.   Blood cultures on arrival: No growth to date.   Blood cultures from 02/06/2012, last hospitalization: No growth to date x2.   Urinalysis not suggestive of infection.   Vitamin B12 elevated at [place].  During last hospitalization [facility] Spotted Fever was negative. Ehrlichia antibody panel was negative. ANA comprehensive panel was within normal limits. RPR, HIV, HSV, and varicella-zoster were negative. Lyme CSF is pending.   CT of the head without contrast on 02/08/2012: No acute intracranial process, As well as CT of the head without contrast on 02/09/2012.   MRI of the brain without contrast on 02/09/2012 is showing no acute intracranial findings, mild chronic microangiopathy.   MRI of the lumbar spine on 02/09/2012 is showing mild acute compression fracture of L5, bilateral sacral insufficiency fractures as well as fracture of the  right posterior iliac wing which also is likely an insufficiency fracture and multilevel degenerative disk and facet disease with moderate canal stenosis at L3-L4 and mild canal stenosis at L4-L5.   HOSPITAL COURSE: The patient was admitted to the hospitalist service and neurology was consulted. The patient underwent EEG which besides generalized slowing  was essentially negative. She was seen by Dr. Steele Berg from neurology and there was a concern for possible viral encephalitis given the fact that the patient had elevated WBC even if you account for the elevated RBC in the lumbar tap during the last admission and therefore acyclovir was recommended to be started to cover this possible viral etiology which could cause encephalitis and is potentially treatable and therefore she was started on acyclovir. Furthermore, Dr. Clayborn Bigness was consulted. The patient did have waxing and waning of mentation, which was more consistent with delirium. The patient also had been hypoglycemic on arrival and furthermore had been on Valium and tramadol. They could also cause delirium as well as possible pain. Nonetheless, ECV came back negative and acyclovir was stopped. Thus far most labs have come back negative and the Lyme CSF is still pending, but the patient is back to her baseline mentally. The patient did need to be on twice a day and p.r.n. Risperdal for her delirium and was also on fluids at that time and now she is back to normal and her Risperdal has been discontinued. The patient did undergo MRI of the brain and lumbar spine which was not done the last hospitalization. MRI of the brain was negative for acute events, but spine MRI to evaluate for paraspinal abscess or other processes showed fractures and the patient was seen by orthopedics, but at that time was delirious and could not quantify pain. However, now as she is back to her baseline state pain is well controlled and can follow up as an outpatient by her orthopedics. The patient was seen by physical therapy and the recommendation was to go home with home health and at this point that would be started for her. She should refrain from taking further Valium and should follow with her primary care physician as an outpatient for follow-up. She has not been significantly hypoglycemic any longer and at this point we still have  the Lyme CSF pending for which she should follow up with her primary care physician.      CODE STATUS: FULL CODE.   TOTAL TIME SPENT: 35 minutes. ____________________________ Vivien Presto, MD sa:slb D: 02/13/2012 14:33:28 ET T: 02/13/2012 17:24:27 ET JOB#: [id number]  cc: Vivien Presto, MD, <Dictator> Lorenza Evangelist, MD Vivien Presto MD ELECTRONICALLY SIGNED 02/15/2012 14:22

## 2014-09-14 NOTE — Consult Note (Signed)
PATIENT NAME:  Cheryl Hall, Cheryl Hall MR#:  [id number] DATE OF BIRTH:  22-Apr-1941  DATE OF CONSULTATION:  02/06/2012  REFERRING PHYSICIAN:  Dr. Posey Pronto  CONSULTING PHYSICIAN:  Heinz Knuckles. Levie Owensby, MD  REASON FOR CONSULTATION: Mental status changes.   HISTORY OF PRESENT ILLNESS: Patient is a 74 year old white female with a past history significant for chronic renal insufficiency who was admitted on 09/10 with acute onset of confusion and balance issues. Patient was recently admitted to the hospital on 09/05 with chest pain. She left AGAINST MEDICAL ADVICE prior to cardiac workup. She states that she had had some pain in her chest that was intermittent. She was given medicine in the hospital and it resolved and she has not had any further chest pain. She apparently is feeling well the night prior to admission other than her chronic back pain. She woke up in the morning and was dizzy and had nausea and a burning feeling on the face. She also describes having chills. She does not recall most of the events leading up to her hospitalization other than having the chills for the last day or so. She did not have any syncopal event but had been confused on the day of admission. Her son took her to the Emergency Room after she was found to be confused. She was having some headache at the time but denies any headache or neck stiffness now. She does not recall any cough or shortness of breath. She does have some chronic allergies which bother her seasonally. Her main complaint currently is her back pain. She was admitted to the hospital. A lumbar puncture was obtained which had 40,000 red cells on the first tube and 10,000 white cells in the second tube. She had elevated white cells as well, however, most of which were neutrophils. She was started on ceftriaxone and vancomycin. Acyclovir was also added. CSF cultures are negative to date. It does not appear that blood cultures were obtained.   ALLERGIES: None.   PAST MEDICAL  HISTORY:  1. Chronic renal insufficiency.  2. Recent admission for chest pains that has not been worked up.  3. Hypertension.  4. Chronic back pain.  5. Hypercholesterolemia.  6. Peripheral neuropathy.  7. Status post vertebral fractures.   FAMILY HISTORY: Positive for hypertension and coronary artery disease.   SOCIAL HISTORY: Patient lives by herself. She is a prior smoker but quit about a year ago. No alcohol or injecting drug use.   REVIEW OF SYSTEMS: GENERAL: Positive chills and sweats. No fevers. Positive malaise and generalized weakness. HEENT: Some headaches. Some sinus congestion that she attributes to allergies. No sore throat. NECK: No stiffness. No swollen glands. RESPIRATORY: Some cough and sputum production that she relates to her allergies. No shortness of breath. CARDIAC: No chest pains recently, although she was recently admitted for chest pain. No peripheral edema. GASTROINTESTINAL: Positive nausea. No vomiting, no abdominal pain, no change in her bowels. GENITOURINARY: No change in her urine. MUSCULOSKELETAL: She has no swollen joints. She has chronic back pain. NEUROLOGIC: She was having dizziness, weakness and near syncope at home as well as confusion. These symptoms have improved. SKIN: No rashes. PSYCHIATRIC: No complaints. All other systems are negative.   PHYSICAL EXAMINATION:  VITAL SIGNS: T-max 99.6, pulse 84, blood pressure 134/76, 96% on room air.   GENERAL: 74 year old white female in no acute distress.   HEENT: Normocephalic, atraumatic. Pupils equal, reactive to light. Extraocular motion intact. Sclerae, conjunctiva and lids are without evidence for emboli or  petechiae. Oropharynx shows no erythema or exudate. Teeth and gums are in fair condition.   NECK: Supple. Full range of motion. Midline trachea. No lymphadenopathy. No thyromegaly.   CHEST: Clear to auscultation bilaterally with good air movement. No focal consolidation.   CARDIAC: Regular rate and  rhythm without murmur, rub, or gallop.   ABDOMEN: Soft, nontender, nondistended. No hepatosplenomegaly. No hernia is noted.   EXTREMITIES: No evidence for tenosynovitis.   SKIN: No rashes. No stigmata of endocarditis, specifically no Janeway lesions nor Osler nodes.   NEUROLOGIC: Patient is awake and interactive. She is somewhat a poor historian and does not recall a lot of the events of yesterday. She was stating that she wanted to go home because of her back pain lying in bed. She did not appear to have insight into the possible seriousness of her condition. She had no neck stiffness and no other meningeal symptoms.   PSYCHIATRIC: Mood and affect appeared pleasant.   LABORATORY, DIAGNOSTIC, AND RADIOLOGICAL DATA: BUN 13, creatinine 0.92, bicarbonate 23, anion gap 9, AST 16, ALT less than 6, alkaline phosphatase 150, total bilirubin 0.2. Troponins were negative x3. Tox screen was negative. White count 3.4 with hemoglobin 9.6, platelet count 172, ANC 3.0. White count on admission was 5.9 with an ANC of 4.3. CSF studies showed in tube one 40,634 red cells, 234 white cells with 86% neutrophils. Glucose was 49 and protein was 103. Tube three had 10,643 red cells, 141 white cells, 75% of which were neutrophils. CSF culture shows no growth with a Gram stain showing a few red cells and rare white cells. A urinalysis was unremarkable. No blood cultures were obtained. CSF PCR is pending. A chest x-ray showed no focal pneumonia or congestive heart failure. CT scan of the head without contrast showed no acute intracranial process. The lumbar puncture which was done under fluoroscopy noted blood-tinged CSF.   IMPRESSION: 74 year old white female with a history of chronic renal insufficiency admitted with chills, confusion and increased white count in the CSF.   RECOMMENDATIONS:  1. She apparently had some confusion on admission but appears to be mentating well today. She wants to go home due to discomfort in  her back with lying in bed. She is a poor historian and does not recall the immediate events which led up to her recent hospitalization. Her lumbar puncture was a bloody tap but despite this, there are increased numbers of white blood cells present, mostly neutrophils. Clinically she does not have meningitis, however.  2. Given the increased neutrophils in the CSF I agree with empiric antibiotic coverage pending cultures. As she is older than 55 she should receive vancomycin, ceftriaxone and ampicillin. Standard treatment for meningitis includes steroids prior to antibiotics but as she has already been on antibiotics and does not clinically have meningitis I would not add steroids at this time.  3. She does not clinically have encephalitis. While HSV can cause meningitis there is no need to treat it as it resolved spontaneously as do other viral etiologies of meningitis. Will discontinue the acyclovir.   4. Will continue droplet isolation until she has been on therapy for 24 hours.  5. Will get blood cultures, although they are less likely to be helpful as she has been on antibiotics.  6. With her back pain we may need to consider MRI to look for diskitis or an epidural abscess which would explain her back pain and her CSF findings.   This is a moderately complex infectious disease  case. Thank you very much for involving me in Cheryl Hall' care.   ____________________________ Heinz Knuckles. Jovany Disano, MD meb:cms D: 02/06/2012 14:17:41 ET T: 02/06/2012 14:37:47 ET JOB#: [id number]  cc: Heinz Knuckles. Quinnlyn Hearns, MD, <Dictator>  Reily Ilic E Kinberly Perris MD ELECTRONICALLY SIGNED 02/07/2012 8:34

## 2014-09-14 NOTE — Consult Note (Signed)
Impression: [phone] WF w/ h/o CRI admitted with chills, confusion and increased WBC in CSF.  She apparently had some confusion on admission, but appears to be mentating well today. She wants to go home due to discomfort in her back with lying in bed.  She is a poor historian and does not recall the immediate events which led up to her recent hospitalization.  Her lumbar puncture was a bloody tap, but despite this, there are an increased number of WBCs present, mostly neutrophils.  Clinically, she does not have meningitis. Given the increased neutrophils in the CSF, I agree with empiric antibiotic coverage.  As she is older than 2, she should receive vanco, ceftriaxone and ampicillin.  Standard treatment for meningitis includes steroids, but as she has already been on antibiotics and does not clinically have meningitis, I would not add steroid at this time. She does not clinically have encephalitis.  While HSV can cause meningitis, there is no need to treat it as it resolves spontaneously as do other viral etiologies of meningitis.  Will d/c the acyclovir. Will continue droplet isolation until she has been on therapy for 24 hours. Will get BCx although they are less likely to be helpful as she has been on antibiotics. 6) With her back pain, we may need to consider MRI to look for discitis or a epidural abscess which would explain her back pain and her CSF findings.    Electronic Signatures: Arnisha Laffoon, Heinz Knuckles (MD) (Signed on 11-Sep-13 14:06)  Authored   Last Updated: 11-Sep-13 14:17 by Charon Smedberg, Heinz Knuckles (MD)

## 2014-09-14 NOTE — Consult Note (Signed)
PATIENT NAME:  Cheryl Hall, Cheryl Hall MR#:  [id number] DATE OF BIRTH:  January 20, 1941  DATE OF CONSULTATION:  02/09/2012  REFERRING PHYSICIAN:  Dr. Hillary Bow  CONSULTING PHYSICIAN:  Drayk Humbarger B. Liisa Picone, MD  REASON FOR CONSULTATION: Altered mental status.  HISTORY OF PRESENT ILLNESS: Cheryl Hall is a 74 year old Caucasian female with past medical history of chronic back pain, chronic kidney disease, hypertension, hyperlipidemia, sciatica who was admitted for altered mental status. She was initially admitted on 09/10 for confusion and chest pain and left AGAINST MEDICAL ADVICE. She then represented to the Emergency Department on 09/11 and was admitted from 09/11 to 09/12 for headache, fever, altered mental status, dizziness and nausea. She underwent a lumbar puncture which showed 141 white blood cells and 10,000 red blood cells in tube #3 and 234 white blood cells and 40,000 red blood cells in tube #1. CSF glucose was 49 and protein was 103. She was initially treated with antibiotics and acyclovir for meningitis and these were discontinued when the HSV PCR was negative. [facility] spotted fever, Lyme and Ehrlichia tests are pending. Urine tox screen was negative. Then after discharge on 09/13 EMS was called when she was found to be nonverbal. Her blood sugar was 56 and she was given an amp of D50. The patient then began talking but she remained confused. It does not appear that she has returned back to her baseline mental status. She is not able to provide any additional history right now due to her altered mental status.   PAST MEDICAL HISTORY:  1. Hypertension. 2. Chronic kidney disease. 3. Anemia. 4. Chronic back pain and sciatica. 5. Hyperlipidemia. 6. Multiple vertebral fractures in the past.  7. 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   REVIEW OF SYSTEMS: A complete fourteen-point review of systems was not able to be completed due to patient's altered mental status.   HOME  MEDICATIONS:  1. Valium p.r.n.  2. Zofran p.r.n.  3. Nexium. 4. Hydrochlorothiazide/lisinopril.   ALLERGIES: No known drug allergies.   FAMILY HISTORY: Noncontributory.   SOCIAL HISTORY: She lives alone. She was independent with her activities of daily living prior to this illness. She quit smoking two years ago. There is no history of alcohol or illicits.   PAST SURGICAL HISTORY: Gallbladder surgery.   PHYSICAL EXAMINATION:  VITAL SIGNS: Temperature 98.8, pulse 64, respirations 12, blood pressure 155/75, pulse oximetry 97% on room air.   GENERAL: Supine, no apparent distress, awake.   CARDIOVASCULAR: Regular rate and rhythm.   RESPIRATORY: Clear anteriorly.   ABDOMEN: Soft.   SKIN: No rashes or lesions.   MENTAL STATUS: She is alert and oriented to person and to hospital but it takes her quite some time to respond to these questions. She is not able to tell me the month, the year or her age. She has significant difficulty following commands. She is not able to name any objects or repeat.   CRANIAL NERVES: Pupils equal, round, and reactive to light. Extraocular movements intact. She blinks to threat bilaterally. Her facial expression is symmetric. Tongue is midline.   MOTOR: She is not able to participate in strength testing. She elevates both arms against gravity without drift. She is able to elevate her legs against gravity.   DEEP TENDON REFLEXES: 2+/4 throughout. Toes are equivocal.   SENSATION: Unable to assess, but she withdraws to pinch x4.   COORDINATION: She is unable to perform finger-to-nose or heel-to-shin testing.   GAIT: Unable to assess.   LABORATORY, DIAGNOSTIC  AND RADIOLOGICAL DATA:  Ammonia level is within normal limits.   LFTs are within normal limits.   BMP is unremarkable.   CBC is significant for mild anemia of 29.4.   Urinalysis is negative.   Folate 16.8.  B12 is 1921.   Noncontrast head CT shows no acute intracranial process.    ASSESSMENT: Cheryl Hall is a 74 year old female who has had symptoms over the past several days of headache, fever, altered mental status, dizziness and nausea. CSF profile from 09/11 reveals elevated white blood cell count despite the fact that it was a bloody tap, elevated protein and relatively normal glucose. Her CSF profile and clinical symptoms are consistent with a viral encephalitis. The CSF profile shows a neutrophilic predominance which can be seen in the early stages of a viral encephalitis or meningitis. It does not appear to be a bacterial meningitis given that her CSF bacterial cultures are negative. HSV encephalitis appears unlikely with a PCR being negative. [facility] spotted fever, Lyme and Ehrlichia testing is still pending.   RECOMMENDATIONS: 1. MRI of the brain with and without contrast will be performed today.  2. MRI of her lumbar spine has also been ordered given that she was having significant back pain.  3. Recommend infectious disease consult.  4. Patient may need repeat lumbar puncture to send for VZV testing but will defer to ID on this.  5. Obtain EEG to evaluate for nonconvulsive seizures.  6. Check RPR.  7. Check HIV.   Thank you for this consultation. We will continue to follow.  ____________________________ Micheline Maze Gennie Dib, MD cbs:cms D: 02/09/2012 12:32:23 ET T: 02/09/2012 13:01:52 ET JOB#: [id number]  cc: Nyemah Watton B. Steele Berg, MD, <Dictator> Arelia Sneddon MD ELECTRONICALLY SIGNED 02/10/2012 13:31

## 2014-09-14 NOTE — H&P (Signed)
PATIENT NAME:  Cheryl Hall, Cheryl Hall MR#:  [id number] DATE OF BIRTH:  [birth year]  DATE OF ADMISSION:  01/30/2012  REFERRING PHYSICIAN: Dr. Mariea Clonts   PRIMARY CARE PHYSICIAN: Used to see Dr. Sherald Barge but now probably assigned to Dr. Kem Kays, [place]: Chest pain.   HISTORY OF PRESENT ILLNESS: The patient is a pleasant 74 year old Caucasian female with history of hypertension, hyperlipidemia, and sciatica who comes in for chest pain. The patient stated yesterday she developed chest pain in the morning which was off and on. The pain was mostly nonradiating but at times also felt it in her left scapula. The pain went away about 9:30. The patient went to sleep and woke up about 3-1/2 hours later with recurrent chest pain. The pain persisted. Here she was given some nitroglycerin and the patient's pain went away. There was some dizziness but no shortness of breath, nausea, vomiting, or radiation of this pain. Hospitalist service was contacted for further evaluation and management.   PAST MEDICAL HISTORY:  1. Hypertension. 2. Hyperlipidemia. 3. Chronic back pain. 4. History of vertebral fractures in the past. 5. Sciatica with right lower extremity weakness and neuropathy.   ALLERGIES: None.   FAMILY HISTORY: Sister with hypertension. Mom has some heart disease and had a pacemaker placed.   PAST SURGICAL HISTORY: Gallbladder surgery.   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 No alcohol or drug use.    MEDICATIONS:  1. Hydrochlorothiazide/lisinopril 12.5/10 mg 1 tab daily.  2. Nexium 40 mg daily.  3. Zofran 4 mg every eight hours as needed.  4. Simvastatin 40 mg daily.  5. Tramadol 50 mg every six hours as needed for pain.   REVIEW OF SYSTEMS: CONSTITUTIONAL: No fever or fatigue. Stays cold. No global weakness. No weight changes. EYES: No blurry vision or double vision. ENT: No tinnitus or hearing loss. No snoring. RESPIRATORY: No cough, shortness of  breath, or wheezing. CARDIOVASCULAR: Chest pain as above. No orthopnea. No edema. No arrhythmia. History of high blood pressure. No history of heart attacks in the past. GI: Occasional nausea. No vomiting or diarrhea. No melena or bloody stools. GU: Denies dysuria. Occasional incontinence. ENDOCRINE: No polyuria or nocturia. HEME/LYMPH: Denied history of anemia or easy bruising. SKIN: No new rashes. MUSCULOSKELETAL: Chronic back pain. In general has sciatica and weakness and numbness in the right lower extremity. PSYCH: No anxiety or insomnia.   PHYSICAL EXAMINATION:   VITAL SIGNS: Temperature on arrival 97.5, pulse rate 68, respiratory rate 18, blood pressure 141/68, oxygen sat 98% on room air.   GENERAL: The patient is a thin built Caucasian female laying in bed in no obvious distress.   HEENT: Normocephalic, atraumatic. Pupils are equal and reactive. Anicteric sclerae. Moist membranes. Extraocular muscles intact.   NECK: Supple. No thyroid tenderness.   CARDIOVASCULAR: S1, S2. No murmurs, rubs, or gallops.   LUNGS: Clear to auscultation. Good effort.   ABDOMEN: Soft, nontender, nondistended. Positive bowel sounds in all quadrants. No organomegaly could be appreciated.   EXTREMITIES: No significant lower extremity edema.   SKIN: No obvious rashes.   NEUROLOGIC: Cranial nerves II through XII grossly intact. Strength is 5/5 in all extremities except right lower extremity secondary to pain at 4/5.   PSYCH: Awake, alert, oriented x3. Pleasant, conversant.   LABORATORY, DIAGNOSTIC, AND RADIOLOGICAL DATA: Creatinine 1.38, was 1.68 in November of 2012, sodium 140, potassium 4.2, GFR 39. LFTs alkaline phosphatase 199, ALT 8, otherwise within normal limits. Troponin negative.  CK-MB negative x1. Hemoglobin 10.3, was 10 in November 2012, hematocrit 31.2, platelets 163, WBC 4.7. Urinalysis not suggestive of infection.   EKG showing sinus bradycardia, rate 58 with sinus arrhythmia, possible left  atrial enlargement, Q waves in V1, V2. No acute ST elevations or depressions.   ASSESSMENT AND PLAN: We have a 74 year old Caucasian female with hypertension, hyperlipidemia, ex-smoker, sciatica, and chronic back pain who presents with chest pain. The patient has negative troponin. Chest pain improved with nitroglycerin. The patient has multiple risk factors and, therefore, would admit the patient for observation and rule out MI. Would start the patient on her statin. Aspirin has already been given. Would start the patient on nitroglycerin. The patient is on lisinopril and HCTZ as an outpatient; however, has a mildly decreased GFR. I think she has likely chronic CKD and, therefore, would hold them for now and I was contemplating starting a beta-blocker, however, the patient has some heart rate in the 50's and 60's and, therefore, I would hold on that for now. I would start her on amlodipine instead for blood pressure. I will cycle the troponins and order an echocardiogram as well as a Myoview in the morning if the patient rules out for acute coronary syndrome. The patient appears to have chronic CKD, likely stage III as the creatinine was also elevated late last year. The patient also has anemia which appears to be chronic which is close to the hemoglobin from late last year. These could be further evaluated as an outpatient. I will start the patient on heparin for DVT prophylaxis.   CODE STATUS: FULL CODE.        TOTAL TIME SPENT: 50 minutes.   ____________________________ Vivien Presto, MD sa:drc D: 01/30/2012 09:40:47 ET T: 01/30/2012 10:38:24 ET JOB#: [id number]  cc: Vivien Presto, MD, <Dictator> Lorenza Evangelist, MD Vivien Presto MD ELECTRONICALLY SIGNED 02/06/2012 0:24

## 2014-09-14 NOTE — H&P (Signed)
PATIENT NAME:  Cheryl Hall, Cheryl Hall MR#:  [id number] DATE OF BIRTH:  07-04-1940  DATE OF ADMISSION:  02/05/2012  PRIMARY CARE PHYSICIAN: Kem Kays, MD   HISTORY OF PRESENT ILLNESS: The patient is a 74 year old Caucasian female with past medical history significant for history of chest pains and admission for chest pain issues just recently on 01/31/2012 who presented back to the hospital with complaints of not feeling well. According to the patient, she was doing well up until the morning on the day of admission. She went to bed last night. She was having some problems with pains in the lower back as well as right hip area kind of pains which seem to be chronic. However, today in the morning when she woke up and tried to get out from the bed she became somewhat staggering, she started to the kitchen, she became nauseous, and felt multiple neurologic troubles such as face feeling on fire and also having some sensation of freezing as well as burning sensation in the arms. She felt forgetful and very confused. She was about to fall and pass out, however, did not fall down or pass out. She felt feverish as well as very weak. On her son's arrival, she was groggy and somewhat confused. She was complaining of some headaches and was brought to the Emergency Room for further evaluation. In the Emergency Room, she had LP done which was bloody, however, showed white blood cell count elevation with 75% of them neutrophils and hospitalist services were contacted for admission for meningitis.   PAST MEDICAL HISTORY:  1. History of admission for chest pains earlier in September 2013.  2. History of hypertension. 3. CKD, stage III. 4. Anemia. 5. Chronic back pain. 6. Sciatica. 7. Hyperlipidemia.  8. History of vertebral fractures in the past.  9. Right lower extremity weakness as well as neuropathy.   MEDICATIONS: The patient was discharged on: 1. HCTZ/lisinopril 12.5/10 once daily. 2. Nexium 40 mg p.o. daily.   3. Zofran 4 mg as needed. 4. Simvastatin 40 mg p.o. daily.  It is unclear if she is still using those medications.  ALLERGIES: None.   FAMILY HISTORY: The patient's sister has hypertension. The patient's mother had some heart disease and had a pacemaker placed.   PAST SURGICAL HISTORY: Gallbladder surgery.   SOCIAL HISTORY: Has smoked in the past, however, quit approximately a year ago, however, smoked earlier today. No alcohol or drug abuse. Lives alone.   REVIEW OF SYSTEMS: Multiple medical issues such as fatigue and weakness for a while, also intermittent weight gain and loss, some blurring of vision, seasonal allergies, feeling arrhythmia as well as feeling presyncopal, having some nausea and constipation, intermittent increasing sweating, restlessness especially whenever she stands up or walks around or sits down, however, admits of having significant dizziness as well as weakness and poor balance. She has been having those symptoms intermittently on and off for a while for years, however, according to the patient's son she was very weak, groggy, was not able to open her eyes and drowsy, could not even talk earlier today, has lower extremity swelling intermittently in her ankles, admits of having lower back pains as well as weakness in the left upper extremity as well as right lower extremity.   PHYSICAL EXAMINATION:  VITAL SIGNS: On arrival to the hospital, temperature 98.1, pulse 108, respiration rate 24, blood pressure 136/81, saturation 98% on room air.   GENERAL: Well developed, well nourished thin Caucasian female in no significant distress, comfortable on  the stretcher.   HEENT: Her pupils are equal and reactive to light. Extraocular movements intact. No icterus or conjunctivitis. Has normal hearing. No pharyngeal erythema. Mucosa is moist.   NECK: No masses. Supple, nontender. Thyroid is not enlarged. No adenopathy. No JVD or carotid bruits bilaterally. Full range of motion.    LUNGS: Clear to auscultation in all fields. Mildly diminished breath sounds but otherwise no significant wheezing, rhonchi, or rales. No labored inspirations, increased effort, dullness to percussion. Not in overt respiratory distress.   CARDIOVASCULAR: S1, S2 appreciated. No murmurs, gallops, or rubs noted. PMI not lateralized. Chest is nontender to palpation. 1+ pedal pulses. 2+ lower extremity edema. No calf tenderness or cyanosis was noted.   ABDOMEN: Soft, nontender. Bowel sounds present. No hepatosplenomegaly or masses were noted.   RECTAL: Deferred.   MUSCULOSKELETAL: Muscle strength able to move all extremities, somewhat weaker in bilateral upper extremities. She also has right lower extremity weakness which seems to be chronic and pain related. No cyanosis, degenerative joint disease, or kyphosis. Gait is not tested.   SKIN: Skin did not reveal any rashes, lesions, erythema, nodularity, or induration. It was warm and dry to palpation. She has scaling and very dry skin.   LYMPH: No adenopathy in the cervical region.   NEUROLOGICAL: Cranial nerves grossly intact. Sensory difficult to obtain as the patient is very poorly cooperative. The patient does not have any dysarthria or aphasia. The patient is alert, oriented to person and place, cooperative, poor judgment. Memory is somewhat impaired. She is intermittently confused. She does not understand or does not hear what she is asked to say.   LABORATORY, DIAGNOSTIC, AND RADIOLOGICAL DATA: Her EKG showed sinus rhythm with frequent PACs, Q waves in V2, nonspecific ST-T changes were noted. Comparatively to prior EKG done on 01/30/2012, no significant change was found.  BMP showed potassium 3.0, otherwise unremarkable. The patient's magnesium level is elevated at 2.5, alkaline phosphatase 198, otherwise unremarkable liver enzymes. Troponin normal at less than 0.02. White blood cell count is normal at 5.9, hemoglobin 11.8, platelet count 207,  absolute neutrophil count is normal at 4.3.   The patient did have CSF checked. First tube showed red color, cloudy, centrifuged was colorless, 40,634 red blood cells, 234 white blood cells, and 86% of them neutrophils were noted in the first tube. Third tube was pink and hazy, 10,643 red blood cells, 141 white blood cells, 75% of them were neutrophils noted. Glucose level was normal at 49. Protein level was elevated at 103. CSF smear showed no organisms, rare white blood cells as well as few red blood cells were seen.   Urinalysis was unremarkable except for 6 red blood cells but otherwise unremarkable.   Chest, portable, single view, 02/05/2012, revealed no evidence of focal pneumonia. No evidence of CHF. Hyperinflation likely reflecting COPD was noted.   CT of head without contrast, 02/05/2012, revealed no acute intracranial process. CT can underestimate first 24 hours after the event according to the radiologist.   Fluoroscopy for lumbar puncture 02/05/2012 successful lumbar puncture under fluoroscopic guidance according to the radiologist.   ASSESSMENT AND PLAN:  1. Serous meningitis. Admit patient to the medical floor. Blood cultures already taken. We will start her on Rocephin, vancomycin, ampicillin, as well as acyclovir. Will continue isolation. Will get ID to see patient for recommendation.  2. Altered mental status questionably related to meningitis. Will get neuro checks as well as ABGs done for this patient who is really complicated. The patient does  report longstanding similar problems. 3. Orthostatic hypotension. Will continue IV fluids. Will hold HCTZ. 4. Hypokalemia. Supplement IV.   TIME SPENT: 1 hour and 15 minutes.   ____________________________ Theodoro Grist, MD rv:drc D: 02/05/2012 18:06:34 ET T: 02/06/2012 06:19:50 ET JOB#: [id number]  cc: Theodoro Grist, MD, <Dictator> Lorenza Evangelist, MD [place] MD ELECTRONICALLY SIGNED 02/06/2012 22:12

## 2014-09-14 NOTE — Consult Note (Signed)
PATIENT NAME:  Cheryl Hall, Cheryl Hall MR#:  [id number] DATE OF BIRTH:  1941-02-09  DATE OF CONSULTATION:  02/11/2012  REFERRING PHYSICIAN:  Vivien Presto, MD   CONSULTING PHYSICIAN:  Heinz Knuckles. Jaise Moser, MD  REASON FOR CONSULTATION: Mental status changes.   HISTORY OF PRESENT ILLNESS: The patient is a 74 year old white female with a past history significant for chronic renal insufficiency who was admitted last week with mental status changes and increased leukocytes in the CSF, who was admitted again one day after discharge on 02/08/2012 with confusion. The patient's mental status had improved fairly dramatically during her few days hospitalization last week. She was initially confused, however, was subsequently within a day able to recognize where she was and speak in full sentences. She was readmitted and has been markedly worse with obvious delirium. She was initially found unresponsive by her son. Her blood sugar was 56, and she was given D50 by EMS and woke up. During her last hospitalization, she had a lumbar puncture which demonstrated a bloody tap but despite the extra blood had extra lymphocytes, mostly neutrophils present. Her CSF cultures were negative. HSV PCR which was negative as well. She did not clinically have encephalitis during her last hospitalization when I saw her. She has a normal white count. Her HIV test was negative as well. She had been complaining of back pain but refused an MRI during her last hospitalization. She was discharged off all antibiotics. An MRI was obtained during this hospitalization which demonstrated compression fracture but no evidence for parameningeal focus of infection. She is currently confused and unable to provide any further history that led up to her more recent hospitalization.   ALLERGIES: None.   PAST MEDICAL HISTORY:  1. Chronic renal insufficiency.  2. Recent admission for chest pains that has not been worked up.  3. Hypertension.  4. Chronic back  pain.  5. Hypercholesterolemia.  6. Peripheral neuropathy.  7. Status post vertebral fractures.  8. Recent hospitalization with leukocytes in the CSF but no obvious infection.   FAMILY HISTORY: Positive for hypertension and coronary artery disease.   SOCIAL HISTORY: The patient lives by herself. She is a prior smoker but quit about a year ago. No alcohol or injecting drug use history.   REVIEW OF SYSTEMS: Review of systems is unable to be obtained from the patient due to her current confusion.   PHYSICAL EXAMINATION:  VITAL SIGNS: T-max of 98.8, T-current of 98.4, pulse 80, blood pressure 147/75, 99% on room air.   GENERAL: A 74 year old white female confused but in no acute distress.   HEENT: Normocephalic, atraumatic. Pupils are equal, reactive to light. Extraocular motion intact. Sclerae, conjunctivae, and lids without evidence for emboli or petechiae. Oropharynx: The patient is not compliant with opening her mouth for any significant amount of time to evaluate her oropharynx.   NECK: Midline trachea. No lymphadenopathy. No thyromegaly. She appears to have fairly good movement of her neck but would not follow commands to bring her chin all the way to her chest.   CHEST: Clear to auscultation bilaterally with good air movement. No focal consolidation.   HEART: Regular rate and rhythm without murmur, rub, or gallop.   ABDOMEN: Soft, nontender, and nondistended. No hepatosplenomegaly. No hernia is noted.   EXTREMITIES: No evidence for tenosynovitis.   SKIN: No rashes. No stigmata of endocarditis, specifically no Janeway lesions nor Osler nodes.   NEUROLOGIC: The patient is awake but markedly confused. She appears to be delusional, and it is difficult  to determine if she is actually having visual hallucinations, but she is talking about people who are not there, and her content of her speech is nonsensical. She would follow simple commands such as opening her eyes but refused to follow  other commands such as opening her mouth. She was moving all four extremities.   PSYCHIATRIC: Difficult to assess due to her confusion.  LABORATORY, DIAGNOSTIC AND RADIOLOGICAL DATA: BUN of 6, creatinine 0.73, bicarbonate 28, anion gap of 7.0. LFTs were unremarkable. White count was 6.3 with a hemoglobin 10.8, platelet count of 177, ANC of 4.9. Her white count was normal flow during her last hospitalization. Her CSF studies showed in tube #3 on September 10th 10,643 red cells with 141 white cells, 75% of which were neutrophils. CSF culture had no growth. Urinalysis on admission this time was unremarkable. An RPR on admission was nonreactive. Rapid HIV testing is negative. B12 was high at over 1900. Last hospitalization, she had RMSF serology of the CSF which were negative, Ehrlichia serologies of blood which were negative, HSV PCR of the CSF which was negative A CT of the head without contrast showed no acute intracranial process. A CT scan of the head without contrast on the next day showed some chronic small vessel ischemic diseases but no acute intracranial process. An MRI of the brain with and without contrast showed no acute intracranial findings, mild chronic microangiopathy. An MRI of the lumbar spine with and without contrast demonstrated mild acute compression fracture of L5 vertebral body. There were bilateral sacral insufficiency fractures as well as fracture of the right posterior iliac wing which is also likely an insufficiency fracture. There are  multiple degenerative disks and facet disease with moderate canal stenosis at L3-L4 and mild canal stenosis at L4-L5. There was no note of any parameningeal focus of infection.   IMPRESSION: A 74 year old white female with a history of chronic renal insufficiency admitted with confusion and recent LP showing increased white cells in the CSF.   RECOMMENDATIONS:  1. Her mental status is much worse than at discharge last week. Her confusion appears to be  waxing and waning. She is not lethargic but is certainly delusional. Her LP during her last hospitalization showed a bloody tap, but even accounting for that she had increased white cells. She did not have meningitis symptoms last hospitalization and does not appear to have them now. The relatively rapid improvement in her mental status last hospitalization made encephalitis unlikely. I still believe that to be the case.  2. Her CSF cultures were negative. 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 This is very unlikely. 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 Without early initiation of treatment, there is unlikely to be any benefit from treatment.  3. I would look for noninfectious etiologies of her confusion/delirium.  4. MRI showed a compression fracture but no evidence for parameningeal focus of infection.   This is a moderately complex Infectious Disease consult.     Thank you very much for involving me in Ms. Rolena Infante' care.   ____________________________ Heinz Knuckles. Zelta Enfield, MD meb:cbb D: 02/11/2012 15:11:23 ET T: 02/11/2012 16:50:14 ET JOB#: [id number]  cc: Heinz Knuckles. Laquanna Veazey, MD, <Dictator> Liliyana Thobe E Davida Falconi MD ELECTRONICALLY SIGNED 02/12/2012 8:35

## 2014-09-14 NOTE — Discharge Summary (Signed)
PATIENT NAME:  Cheryl Hall, Cheryl Hall MR#:  [id number] DATE OF BIRTH:  01/22/1941  DATE OF ADMISSION:  01/30/2012 DATE OF DISCHARGE:  01/31/2012  The patient walked out [place] on the morning of 01/31/2012 before work-up could be completed.  PRIMARY CARE PHYSICIAN: Used to see Sherald Barge at [facility] but now possibly assigned to Dr. Kem Kays.    CHIEF COMPLAINT: Chest pain.   DIAGNOSES AT THE TIME OF WALKING OUT AGAINST MEDICAL ADVICE:  1. Chest pain, possibly cardiac, however, unsure as the patient walked out [place].  2. Hypertension.  3. Renal failure, likely chronic stage III.  4. History of anemia.  5. History of chronic back pain.  6. History of sciatica.   HISTORY OF PRESENT ILLNESS: For full details of the history and physical, see dictation on 01/30/2012 by Dr. Bridgette Habermann. Briefly, this is a 74 year old female with history of hypertension, hyperlipidemia, and sciatica who came in for chest pain which she initially developed the day prior to admission off and on with some improvement, however, it came back the night prior to admission waking her up from sleep. She was admitted to the hospitalist service and was given some nitroglycerin and the pain went away. She was given aspirin, started on morphine and nitroglycerin p.r.n. and nitro patch was applied. The patient had a negative troponin. The next morning prior to completion of her work-up including a stress test which was ordered, the patient walked out [place].  CODE STATUS: The patient is FULL CODE.     TIME SPENT: Total time spent on dictation and following up medical records and labs was 20 minutes.   ____________________________ Vivien Presto, MD sa:drc D: 01/31/2012 16:03:19 ET T: 02/01/2012 13:38:12 ET JOB#: [id number]  cc: Vivien Presto, MD, <Dictator> Lorenza Evangelist, MD Vivien Presto MD ELECTRONICALLY SIGNED 02/06/2012 0:24

## 2014-09-14 NOTE — Discharge Summary (Signed)
PATIENT NAME:  Cheryl Hall, Cheryl Hall MR#:  [id number] DATE OF BIRTH:  03-14-1941  DATE OF ADMISSION:  02/06/2012 DATE OF DISCHARGE:  02/07/2012  PRESENTING COMPLAINT: Headache and feeling groggy.   DISCHARGE DIAGNOSES:  1. Headache and fever, resolved. No signs of meningitis clinically.  2. Elevated total WBC count on CSF. The patient refused MRI of the lumbar spine to rule out parameningeal focus. 3. Chronic low back pain.  4. Tobacco abuse.  5. Hypertension.   MEDICATIONS: 1. Valium 5 mg 1 tablet 4 times a day as needed.  2. Hydrochlorothiazide/lisinopril 12.5/10 one tablet daily.  3. Nexium 40 mg daily.  4. Zofran 4 mg every eight hours as needed.   RECOMMENDATIONS: The patient was recommended outpatient open MRI of the lumbar spine.   FOLLOW-UP: Follow-up with Dr. Kem Kays in 1 to 2 weeks.   LABORATORY, DIAGNOSTIC, AND RADIOLOGICAL DATA: Blood culture negative in 8 to 12 hours. Cardiac enzymes negative. Urine drug screen negative. White count 3.4, hemoglobin and hematocrit 9.6 and 29.2, platelet count 172. Comprehensive metabolic panel within normal limits except glucose of 171, chloride 112, calcium 8.2, alkaline phosphatase 150. Ehrlichia antibody panel is negative. CSF traumatic tap white count 234, neutrophils 86%. Protein CSF 103. glucose CSF 49. CSF culture with Gram stain negative in 48 hours. HSV I and II DNA negative on the CSF.   Lyme by [facility] Blot pending. [place] Spotted Fever in the CSF results pending.   CT of the head without contrast shows no acute intracranial process.   White count on admission was normal.   CONSULTATION: ID consultation with Dr. Clayborn Bigness    BRIEF SUMMARY OF HOSPITAL COURSE: [name] is a 74 year old Caucasian female with history of chronic low back pain and hypertension who came in with:   1. Low-grade fever and headache with feeling groggy. She was admitted with suspected meningitis. Clinically the patient did not exhibit any signs or  symptoms of meningitis or encephalitis. CSF showed traumatic with elevated neutrophils. The patient was empirically started on Rocephin, vancomycin, and ampicillin. CSF cultures with Gram stain remained negative. She was seen by Dr. Clayborn Bigness who recommended MRI of the lumbar spine to look for any parameningeal focus given elevated CSF. The patient refused to get MRI done despite being off her p.o. anxiety medicine. The patient is recommended to get open MRI as outpatient. She remained afebrile. White count was stable. Antibiotics were discontinued at discharge.  2. 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 P.r.n. Ultram.  3. Relative low blood pressure at admission. Received IV fluids. Blood pressure meds were resumed at discharge.  4. Gastroesophageal reflux disease. Remained on PPI.   Hospital stay otherwise remained stable.   CODE STATUS: The patient remained a FULL CODE.   TIME SPENT: 40 minutes.   ____________________________ Hart [place] Posey Pronto, MD sap:drc D: 02/08/2012 07:15:58 ET T: 02/08/2012 14:44:26 ET JOB#: [id number]  cc: Jaliza Seifried A. Posey Pronto, MD, <Dictator> Lorenza Evangelist, MD Ilda Basset MD ELECTRONICALLY SIGNED 02/11/2012 22:16

## 2014-09-17 NOTE — Consult Note (Signed)
Psychiatry: Consultation for this 74 year old woman who presented to the hospital with altered mental status. Information obtained from the patient, the chart and from the patient's son. presented to the hospital on March 18th in a confused mental state with paranoia,delusions and confused thinking. According to the patient's son she had been in her normal mental state until several days previously. He says that over a few days stay saw her decline with increasing confusion. He tells me that the things she was talking about including believing that people were in her house were all delusional. The only recent change in medicine he is aware of his a recent treatment with antibiotics. Otherwise he does not know of any new recent stressor. The patient is able to tell me that she is in the hospital a cause of diverticulitis. She is vague about exactly how she came to be here although she does remember that it involved being at a gas station. Patient denies any symptoms of depression. She does describe what sounds like paranoid and odd thoughts that are still ongoing. She tells me that since being in the hospital she has not had any hallucinations and does not feel that anyone has been acting strange were treating her badly in the hospital. She tends to ramble in her conversation and tell stories about believing that her sister or sister-in-law were breaking into her house for some kind of evil purpose. None of it makes much sense or holds together. This is my first interaction with the patient but it appears to me that she is already significantly better than she was 2 days ago. psychiatric history: Patient had had a hospitalization here recently for a similar episode of delirium which cleared up. The son admits that that episode had cleared up completely and that she was back to normal for about 2 months. Prior to these last couple of episodes the patient has no prior psychiatric history whatsoever. No history of  psychiatric hospitalization, no history of psychiatric diagnoses no history of psychosis or suicidality. history: Patient lives by herself. She has one adult son who checks in on her regularly. The patient and son agreed that at her baseline she is able to take care of herself at home, get herself to the store, shop, handle money and generally seems to function well. 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 history: History of hypertension, dyslipidemia, chronic back pain. abuse history: No history of alcohol or drug abuse history. status exam: Pleasant and cooperative woman who looks her stated age or older. She was awake and alert and stayed awake throughout the interview. She was cooperative with the entire interview. Eye contact was good. Psychomotor activity was normal. Speech was normal in rate and tone. Affect was generally euthymic and appropriate. Mood was stated as being okay. Thoughts tended to ramble. She had a difficult time answering even simple questions without going off on a tangent. Some of her tangents developed into what sound like extended paranoid delusions but at no time was she hostile or threatening. She denied that she was having any auditory hallucinations in the hospital. She does report what sound like probably some hallucinations at home. She totally denies any suicidal or homicidal ideation. On Mini-Mental status exam she scores a 23/30 which is only borderline for dementia possibly not demented at all. 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 No acute lesions. On admission to the hospital she had an  elevated creatinine. Labs have largely improved. This is a 74 year old woman who has had 2 episodes of fairly rapid onset psychotic symptoms which have also cleared up fairly quickly. In a person with this history the diagnosis is almost certainly delirium related to some kind  of medical issue. People do not develop schizophrenia at this age and the history is not consistent with schizophrenia. It is unlikely that this is any other kind of primary "psychiatric" illness. At this point she is still showing some signs of confusion and paranoia. The son has not spoken with her today so he can't tell me how much she has really cleared up yet. At baseline apparently she was functioning well at home. I note that Risperdal has been prescribed but has not yet been administered. plan: I agree with prescribing low doses of Risperdal but I have changed the dose to 0.5 mg twice a day. Continue with checking labs in one of her appropriate workup is needed. I would be interested in hearing the son's opinion of how she is doing over the next day or so. Hopefully she will continue to clear up and returned to her baseline. If she tolerates the Risperdal I would recommend leaving her on a low dose of it after discharge. 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 spent on consult 60 minutes. Majority of time in direct patient care and care coordination. apologize for this consult being done late. I was not informed of the consult until late this afternoon. I don't know where in the usual procedure things were dropped but I was not informed of the consult yesterday.  Electronic Signatures: Gonzella Lex (MD)  (Signed on 20-Mar-14 18:56)  Authored  Last Updated: 20-Mar-14 18:56 by Gonzella Lex (MD)

## 2014-09-17 NOTE — H&P (Signed)
PATIENT NAME:  Cheryl Hall MR#:  [id number] DATE OF BIRTH:  11/06/1943  DATE OF ADMISSION:  08/12/2012  ADMITTING PHYSICIAN: Gladstone Lighter, MD  PRIMARY CARE PHYSICIAN: Probably McLaughlin  CHIEF COMPLAINT: Confusion.   HISTORY OF PRESENT ILLNESS: The patient is a 74 year old Caucasian female, very disheveled-appearing, brought in as she was found wandering at a gas station. The patient is unable to state her name or date of birth.  At the present time, she said her name is Cheryl Hall, but initially she told it was Cheryl Hall, so not sure what the patients right name is or if this is her correct date of birth.  Son was contacted, and he is not sure of the patient's date of birth either, so we are awaiting for sons arrival and no phone number available at this time to talk to son. On interviewing the patient, she is not a great historian.  She has inconsistent history.  During the conversation, she did Hall about bolting herself in the home, not able to trust her neighbors, and she is hearing things behind the doors, and the son does not believe it.  She says she does not drink water and only drinks sweet tea in the home. She also mentioned that she has seen her 2 sisters coming to her house from a locked door, and when requesting coffee they put some blood in a cup.  So, she has been having some visual and auditory hallucinations.  According to the ER physician notes, when the son called him he mentioned mother with confused today. I do not have any details if she has any psych history, if she has any baseline dementia, or if this is all acute.   PAST MEDICAL HISTORY: Unable to get any history.   PAST SURGICAL HISTORY: She does have a cholecystectomy scar in place.   ALLERGIES: Not known.   HOME MEDICATIONS: Not known at this time.   SOCIAL HISTORY: She apparently lives at home by herself. No smoking or alcohol use.   FAMILY HISTORY: Not known.   REVIEW OF SYSTEMS:   CONSTITUTIONAL: She denies any fever, fatigue or weakness.  EYES: She complains of blurred vision in spite of her reading glasses. No glaucoma or cataracts.  EARS, NOSE AND THROAT: Denies ear pain, tinnitus, hearing loss, epistaxis or discharge.  RESPIRATORY: No cough, wheeze, hemoptysis or COPD.  CARDIOVASCULAR: Denies chest pain, orthopnea, edema, arrhythmia, palpitations.  GASTROINTESTINAL: Positive for nausea. No vomiting or diarrhea. No abdominal pain, hematemesis or melena.  GENITOURINARY: Trouble with incontinence at times, denies dysuria or frequency.  ENDOCRINE: No polyuria, nocturia, thyroid problems, heat or cold intolerance.  HEMATOLOGY: No anemia, easy bruising or bleeding.  SKIN: Acne, rash or lesions.  MUSCULOSKELETAL: No neck, back, shoulder pain or arthritis,  NEUROLOGICAL: No CVA, TIA or seizures.  PSYCHOLOGICAL: No anxiety, positive for hallucinations.   PHYSICAL EXAMINATION:  VITAL SIGNS: Temperature 97.6 degrees Fahrenheit,  pulse 77, respirations 18, blood pressure 120/59, pulse ox 97% on room air.  GENERAL: Well-built, thin, ill-nourished female lying in bed, not in any acute distress.  HEENT: Normocephalic, atraumatic. Pupils are equal, round, reacting to light. Anicteric sclerae. Extraocular movements intact. Oropharynx clear without erythema, mass or exudates.  NECK: Supple. No thyromegaly, JVD, or carotid bruits.  No lymphadenopathy.  LUNGS: Moving air bilaterally. No wheeze or crackles. No use of accessory muscles for breathing. Decreased bibasilar breath sounds.  CARDIOVASCULAR: S1, S2 regular rate and rhythm, 3/6 systolic murmur. No rubs or gallops.  ABDOMEN: Mild discomfort in the periumbilical region. No guarding or rigidity. Normal bowel sounds.  EXTREMITIES: No pedal edema. Dorsalis pedis pulses 2+ palpable bilaterally.  SKIN: Extremely dry skin with flaking off.  No acne, rash or lesions.  LYMPHATICS: No cervical lymphadenopathy.  NEUROLOGIC: Cranial  nerves seem intact. Able to move all 4 extremities.  Not able to cooperate for a complete sensory exam.  PSYCHOLOGIC: Seems to be alert but not oriented.   LABORATORY AND RADIOLOGICAL DATA:  WBC 7.3, hemoglobin 10.1, hematocrit 32.9, platelet count 193. Sodium 141, potassium 4.6, chloride 112, bicarbonate 18, BUN 58, creatinine 1.86, glucose 76, calcium of 8.9.  ALT 9, AST 30, alk phos 93, total bili 0.3, albumin of 3.9. Troponin 0.02, INR 1.1, PTT 26.9. Urinalysis negative for any infection. Urine tox screen was negative.   Chest x-ray is showing no acute cardiopulmonary disease, mild right apical pleural parenchymal thickening compared to prior radiographs. CT of the head showing chronic microangiopathy changes. No acute intracranial process.  EKG revealing normal sinus rhythm, heart rate of 77 and inverted T waves in V1 and flattened T in V2.   ASSESSMENT AND PLAN: A 74 year old female with unknown past medical history, was found wandering at a gas station, extremely confused, was brought in.  No family available and no history available, and is being admitted, not even sure if the name and date of birth are right.   1. Altered mental status, either delirium versus dementia versus metabolic encephalopathy versus schizophrenia with psychosis:  No source of infection.  Acute renal failure on labs.  She appears dehydrated, so we will admit to Medicine, IV fluids. CT of the head is negative for acute changes. Cultures have been sent. No source of infection identified yet. Once the son comes here or we get contact information of the son, we can get more history. We will also need to rule out any psych disorder, especially with her paranoid hallucinations.  We will get psych consult.  2. MRI of the brain: Neuro checks and continue to monitor.  3. Acute renal failure, dehydration, probably prerenal:  IV fluids and recheck in a.m. 4. Metabolic acidosis secondary to renal failure: Recheck.   5. Gastrointestinal  and deep vein thrombosis prophylaxis: On Protonix and TEDs and SCDs.  6. She will need a physical therapy care and care management consult.   CODE STATUS: FULL CODE at this time.   TIME SPENT ON ADMISSION: 50 minutes.   ____________________________ Gladstone Lighter, MD rk:cb D: 08/12/2012 20:46:20 ET T: 08/12/2012 21:12:04 ET JOB#: [id number]  cc: Gladstone Lighter, MD, <Dictator>

## 2014-09-17 NOTE — Consult Note (Signed)
PATIENT NAME:  Cheryl Hall, TIMPONE MR#:  [id number] DATE OF BIRTH:  05/26/1941  DATE OF CONSULTATION:  08/14/2012  CONSULTING PHYSICIAN:  Boykin Baetz S. Gretel Acre, MD  REASON FOR CONSULTATION: Depression.   HISTORY OF PRESENT ILLNESS: The patient is a 74 year old female who was admitted on March 18 as she was found wandering at the gas station. At that time, the patient was unable to state her name or date of birth. She apparently lives by herself in her own apartment.   Psychiatric consult was placed, as the physicians wanted to find out if the patient does have the capacity to make decisions or not. During my interview, the patient mentioned that she is currently at the [facility] for the past 3 days. She reported that she came to the hospital as she was hurting in the chest due to her history of diverticulitis. She stated that she lives alone and she loves it. She has been living alone for years and years.  The patient reported that she is cared for by her son and her neighbor. Stated that her neighbor usually comes and checks on her. She mentioned that she flicks on a light on the back door and then the neighbor will come and help her out. She spends time watching TV. The patient also mentions that she has quit smoking 2 years ago. The patient reported that she drives for herself. The patient is currently supported by her Social Security disability of 136 dollars per month. The patient reported that her son gave her 200 dollars for Christmas and she is still deciding what to buy for herself. Collateral information was obtained by her son, [name], by calling [phone]. He reported that the patient is usually doing well and she lives by herself. However, he noticed that she has started declining for the past 1 week. She was not making any sense and was becoming worse and paranoid. He stated that for the past couple of days, she started becoming "looney." He went back to check on her yesterday morning and she was  worse. As he was deciding what to do about her, that one of the neighbors asked her to take her to the primary care physician. He was thinking about the same, that somebody told her that she had started wandering around in the neighbor road and then the patient ended up in the hospital. He stated that this morning, the patient is still the same in the hospital and he thinks that she probably might need some medications. The patient's son is comfortable that the patient can stay by herself in her house if she will take her medication which will help clear her mind. He stated that she can drive by herself and she can take care of her own business. He does not appear to have any acute problems with the patient living on her own, as he stated that he usually checks on her on a regular basis and has been very helpful and the neighbors also do the same.   PAST PSYCHIATRIC HISTORY: He reported that the patient has never taken any medications for depression, dementia or anxiety. She has never been diagnosed with dementia in the past.   PAST SURGICAL HISTORY: The patient has a history of cholecystectomy in the past.   ALLERGIES: No known drug allergies.   CURRENT HOME MEDICATIONS: Not significant.   SOCIAL HISTORY: The patient currently lives by herself. She drives car and she does not take any illicit drugs.   FAMILY HISTORY:  The patient has only 1 son, who is very supportive.   MENTAL STATUS EXAMINATION: The patient is a thinly built female who was sitting in the bed. She was calm and cooperative. Her mood was fine. Affect was congruent. Thought process was logical, goal-directed. She denied having any auditory or visual hallucinations at this time. She appears to have some age-related decline in the memory. She denied having any paranoia.   DIAGNOSTIC IMPRESSION:  AXIS I: Dementia, not otherwise specified.  AXIS II: None.  AXIS III: History of diverticulitis.   TREATMENT PLAN: Discussed with the  patient at length about the medications, treatment, risks, benefits and alternatives. She is in agreement with the plan for the patient be started on some medication to help her with some delusions. I will start her on Risperdal 12.5 mg in the morning and 25 at bedtime. If the patient becomes too sedated, we will decrease the dose for her. The patient can be discharged back once she becomes clinically stable.   Thank you for allowing me to participate in the care of this patient.    ____________________________ Cordelia Pen. Gretel Acre, MD [id number] D: 08/14/2012 17:48:12 ET T: 08/14/2012 18:39:31 ET JOB#: [id number]  cc: Cordelia Pen. Gretel Acre, MD, <Dictator> Jeronimo Norma MD ELECTRONICALLY SIGNED 08/18/2012 15:31

## 2014-09-17 NOTE — Consult Note (Signed)
Details:   - Psychiatry: Followup note on this patient seen for new onset psychosis. I interviewed her around lunchtime today. The patient at that time was awake and alert and friendly and interactive. She appeared to be even more coherent today than she was yesterday. She was not talking about anything paranoid that I could discover. She told me she was looking forward to getting back home and felt no fear or concern about going back home. She seemed to have improved memory of the events leading to her hospitalization and better insight. Her affect was calm and pleasant. Based on my interview with her today I don't have any change in recommendations. I understand that her mental state might wax and wane during the day. I've still recommend having home health or in a check up on her at least temporarily when she is ready for discharge. Pleas call for psychiatry consult over the weekend if necessary.   Electronic Signatures: Gonzella Lex (MD)  (Signed 22-Mar-14 00:26)  Authored: Details   Last Updated: 22-Mar-14 00:26 by Gonzella Lex (MD)

## 2014-09-17 NOTE — Discharge Summary (Signed)
PATIENT NAME:  Cheryl Hall, Cheryl Hall MR#:  [id number] DATE OF BIRTH:  09-23-1940  DATE OF ADMISSION:  08/12/2012 DATE OF DISCHARGE: 08/17/2012   PRIMARY CARE PHYSICIAN: Cheryl Kays, MD.   FINAL DIAGNOSES:  1.  Encephalopathy and acute delirium on underlying dementia.  2.  Acute renal failure and acidosis, which resolved.  3.  Iron deficiency anemia.  4.  Gastroesophageal reflux disease.  5.  Hyperlipidemia.   MEDICATIONS ON DISCHARGE: Vitamin B12 1 tablet daily, simvastatin 40 mg daily, Risperdal 0.5 mg 1 tablet twice a day, ferrous sulfate 325 mg twice a day, omeprazole 20 mg daily, calcium and vitamin D 1 tablet twice a day.   REFERRAL: Physical therapy, nurse and nurse aide to help out with meds and strength.   DIET: Regular, regular consistency.   ACTIVITY: As tolerated.   REFERRAL: Would like the patient to see Cheryl Hall, gastroenterology in 3 weeks. Follow up in 1 to 2 weeks with Dr. Kem Hall or Cheryl Hall.   REASON FOR ADMISSION: The patient was admitted 08/12/2012 and discharged 08/17/2012. She was admitted with confusion.   HISTORY OF PRESENT ILLNESS: This is a 74 year old female, disheveled appearing was found wandering at a gas station. She was unable to give her name at that time. She was admitted with altered mental status, encephalopathy with psychosis features and delirium. For the acute renal failure, dehydration and acidosis, the patient was given IV fluid hydration.   LABORATORY AND RADIOLOGICAL DATA DURING THE HOSPITAL COURSE: Ethanol level less than 3. PT/INR and PTT normal range. Troponin negative. Glucose 76, BUN 58, creatinine 1.86, sodium 141, potassium 4.6, chloride 112, CO2 18, calcium 8.9. Liver function tests normal range. White blood cell count 7.2, hemoglobin and hematocrit 10.1 and 32.9, platelet count 193. CT scan of the head showed no acute intracranial process. Chest x-ray showed no acute cardiopulmonary disease. Urine toxicology is negative.  Urinalysis 1+ leukocyte esterase. Blood cultures were negative. EKG showed normal sinus rhythm and septal Q waves. Hemoglobin A1c 4.7. Abdominal x-ray negative. Creatinine on 03/19 improved to 1.01. CT scan of the abdomen and pelvis showed that the previously demonstrated findings with acute diverticulitis are not evident. Folic acid 97.0. B12 1451. RPR titer nonreactive. MRI of the brain showed no acute intracranial abnormality. Hemoglobin upon discharge 10. Ferritin 3, TIBC 263, iron serum 26.   HOSPITAL COURSE PER PROBLEM LIST:  1.  For the patient's encephalopathy, initially thought to be in acute delirium, underlying dementia and did have psychotic features. The patient was started on low-dose Risperdal. The patient was seen in consultation by Dr. Weber Hall of psychiatry. At the time of discharge, the patient was talking coherently answering questions appropriately. The disposition is an issue here because she does live alone. I did speak with the son at length on the 03/22 and 03/23. The patient is stable for discharge home. I did speak with him about aides, which would be a self pay that may be needed. I will set up home health for the patient's PT R.N. and aide, but that is not 24/7 coverage. The patient is able to ambulate with physical therapy and also with the nursing staff and did well. Since she answering questions appropriately and probably back to baseline mental status, there was no reason to keep her in the hospital further. I  will continue Risperdal as outpatient.  2.  For acute renal failure and acidosis, this resolved with IV fluid hydration.  3.  For iron deficiency anemia,  I did  start ferrous sulfate. I do recommend following up with Cheryl Hall as outpatient. Of note, she did have a colonoscopy back in 2000 that showed a polyp and did have an endoscopy a couple of years ago that showed gastritis. Would recommend GI followup as outpatient.  4.  Gastroesophageal reflux disease. Continue  omeprazole.  5.  Hyperlipidemia, on simvastatin. I advised the patient not to take any aspirin products or BC powder at this time.   TIME SPENT ON DISCHARGE: 35 minutes.    ____________________________ Cheryl Conch. Leslye Peer, MD rjw:aw D: 08/17/2012 13:58:38 ET T: 08/17/2012 14:50:45 ET JOB#: [id number]  cc: Cheryl Conch. Leslye Peer, MD, <Dictator> Cheryl Evangelist, MD Cheryl Cradle, PA-C Marisue Brooklyn MD ELECTRONICALLY SIGNED 08/27/2012 10:30

## 2014-09-17 NOTE — H&P (Signed)
PATIENT NAME:  Cheryl Hall, Cheryl Hall MR#:  [id number] DATE OF BIRTH:  [birth year]  DATE OF ADMISSION:  08/12/2012  ADMITTING PHYSICIAN: Gladstone Lighter, MD  PRIMARY CARE PHYSICIAN: Probably McLaughlin  CHIEF COMPLAINT: Confusion.   HISTORY OF PRESENT ILLNESS: The patient is a 74 year old Caucasian female, very disheveled-appearing, brought in as she was found wandering at a gas station. The patient is unable to state her name or date of birth.  At the present time, she said her name is Cheryl Hall, but initially she told it was Cheryl Hall, so not sure what the patient's right name is or if this is her correct date of birth.  Son was contacted, and he is not sure of the patient's date of birth either, so we are awaiting for son's arrival and no phone number available at this time to talk to son. On interviewing the patient, she is not a great historian.  She has inconsistent history.  During the conversation, she did Hall about bolting herself in the home, not able to trust her neighbors, and she is hearing things behind the doors, and the son does not believe it.  She says she does not drink water and only drinks sweet tea in the home. She also mentioned that she has seen her 2 sisters coming to her house from a locked door, and when requesting coffee they put some blood in a cup.  So, she has been having some visual and auditory hallucinations.  According to the ER physician notes, when the son called him he mentioned mother with confused today. I do not have any details if she has any psych history, if she has any baseline dementia, or if this is all acute.   PAST MEDICAL HISTORY: Unable to get any history.   PAST SURGICAL HISTORY: She does have a cholecystectomy scar in place.   ALLERGIES: Not known.   HOME MEDICATIONS: Not known at this time.   SOCIAL HISTORY: She apparently lives at home by herself. No smoking or alcohol use.   FAMILY HISTORY: Not known.   REVIEW OF SYSTEMS:   CONSTITUTIONAL: She denies any fever, fatigue or weakness.  EYES: She complains of blurred vision in spite of her reading glasses. No glaucoma or cataracts.  EARS, NOSE AND THROAT: Denies ear pain, tinnitus, hearing loss, epistaxis or discharge.  RESPIRATORY: No cough, wheeze, hemoptysis or COPD.  CARDIOVASCULAR: Denies chest pain, orthopnea, edema, arrhythmia, palpitations.  GASTROINTESTINAL: Positive for nausea. No vomiting or diarrhea. No abdominal pain, hematemesis or melena.  GENITOURINARY: Trouble with incontinence at times, denies dysuria or frequency.  ENDOCRINE: No polyuria, nocturia, thyroid problems, heat or cold intolerance.  HEMATOLOGY: No anemia, easy bruising or bleeding.  SKIN: Acne, rash or lesions.  MUSCULOSKELETAL: No neck, back, shoulder pain or arthritis,  NEUROLOGICAL: No CVA, TIA or seizures.  PSYCHOLOGICAL: No anxiety, positive for hallucinations.   PHYSICAL EXAMINATION:  VITAL SIGNS: Temperature 97.6 degrees Fahrenheit,  pulse 77, respirations 18, blood pressure 120/59, pulse ox 97% on room air.  GENERAL: Well-built, thin, ill-nourished female lying in bed, not in any acute distress.  HEENT: Normocephalic, atraumatic. Pupils are equal, round, reacting to light. Anicteric sclerae. Extraocular movements intact. Oropharynx clear without erythema, mass or exudates.  NECK: Supple. No thyromegaly, JVD, or carotid bruits.  No lymphadenopathy.  LUNGS: Moving air bilaterally. No wheeze or crackles. No use of accessory muscles for breathing. Decreased bibasilar breath sounds.  CARDIOVASCULAR: S1, S2 regular rate and rhythm, 3/6 systolic murmur. No rubs or gallops.  ABDOMEN: Mild discomfort in the periumbilical region. No guarding or rigidity. Normal bowel sounds.  EXTREMITIES: No pedal edema. Dorsalis pedis pulses 2+ palpable bilaterally.  SKIN: Extremely dry skin with flaking off.  No acne, rash or lesions.  LYMPHATICS: No cervical lymphadenopathy.  NEUROLOGIC: Cranial  nerves seem intact. Able to move all 4 extremities.  Not able to cooperate for a complete sensory exam.  PSYCHOLOGIC: Seems to be alert but not oriented.   LABORATORY AND RADIOLOGICAL DATA:  WBC 7.3, hemoglobin 10.1, hematocrit 32.9, platelet count 193. Sodium 141, potassium 4.6, chloride 112, bicarbonate 18, BUN 58, creatinine 1.86, glucose 76, calcium of 8.9.  ALT 9, AST 30, alk phos 93, total bili 0.3, albumin of 3.9. Troponin 0.02, INR 1.1, PTT 26.9. Urinalysis negative for any infection. Urine tox screen was negative.   Chest x-ray is showing no acute cardiopulmonary disease, mild right apical pleural parenchymal thickening compared to prior radiographs. CT of the head showing chronic microangiopathy changes. No acute intracranial process.  EKG revealing normal sinus rhythm, heart rate of 77 and inverted T waves in V1 and flattened T in V2.   ASSESSMENT AND PLAN: A 74 year old female with unknown past medical history, was found wandering at a gas station, extremely confused, was brought in.  No family available and no history available, and is being admitted, not even sure if the name and date of birth are right.   1. Altered mental status, either delirium versus dementia versus metabolic encephalopathy versus schizophrenia with psychosis:  No source of infection.  Acute renal failure on labs.  She appears dehydrated, so we will admit to Medicine, IV fluids. CT of the head is negative for acute changes. Cultures have been sent. No source of infection identified yet. Once the son comes here or we get contact information of the son, we can get more history. We will also need to rule out any psych disorder, especially with her paranoid hallucinations.  We will get psych consult.  2. MRI of the brain: Neuro checks and continue to monitor.  3. Acute renal failure, dehydration, probably prerenal:  IV fluids and recheck in a.m. 4. Metabolic acidosis secondary to renal failure: Recheck.   5. Gastrointestinal  and deep vein thrombosis prophylaxis: On Protonix and TEDs and SCDs.  6. She will need a physical therapy care and care management consult.   CODE STATUS: FULL CODE at this time.   TIME SPENT ON ADMISSION: 50 minutes.   ____________________________ Gladstone Lighter, MD rk:cb D: 08/12/2012 20:46:00 ET T: 08/12/2012 21:12:04 ET JOB#: [id number]  cc: Gladstone Lighter, MD, <Dictator>  Gladstone Lighter MD ELECTRONICALLY SIGNED 09/12/2012 15:32

## 2014-09-18 NOTE — H&P (Signed)
PATIENT NAME:  Cheryl Hall, MYRICK MR#:  [id number] DATE OF BIRTH:  15-Apr-1941  DATE OF ADMISSION:  10/16/2013  PRIMARY CARE PHYSICIAN: Ramonita Lab, MD  CHIEF COMPLAINT: Sent in from primary care office for manic symptoms.   HISTORY OF PRESENT ILLNESS: This is a 74 year old female who at this point is unable to give me much history secondary to sedation. She was given 5 mg of Haldol and 1 mg of Ativan secondary to agitation. As per the ER physician, the patient has been manic, not sleeping, found to have an elevated salicylate level. Hospitalist services were contacted for further admission for elevated salicylate level. The patient states that she does take aspirin, but not listed on the medication prescription Probation officer. History is difficult to obtain secondary to sedation. History obtained from old chart and records sent over from Dr. Olin Pia office.   PAST MEDICAL HISTORY: Vitamin D deficiency, hyperlipidemia, hypertension, gastroesophageal reflux disease, osteoporosis, chronic kidney disease.   PAST SURGICAL HISTORY: Cholecystectomy.   ALLERGIES: No known drug allergies.   MEDICATIONS: As per prescription writer include: Vitamin B12 1 tablet daily, simvastatin 40 mg daily, Risperdal 0.5 mg twice a day, omeprazole 20 mg daily, lisinopril 30 mg daily, ferrous sulfate 325 mg twice a day, calcium and vitamin D 600/400 international units 1 tablet twice a day.   SOCIAL HISTORY: She is a smoker. No alcohol. No drug use. She states that she lives alone.   FAMILY HISTORY: Unable to obtain secondary to altered mental status.  REVIEW OF SYSTEMS: Unable to obtain secondary to altered mental status.   PHYSICAL EXAMINATION: VITAL SIGNS: Temperature 98.4, pulse 78, respirations 18, blood pressure 117/75, pulse ox 98% on room air.  GENERAL: No respiratory distress.  EYES: Eyes: Conjunctivae and lids normal. Pupils equal, round, and reactive to light. Unable to test extraocular muscles.  ENT: Throat: No  erythema. Nasal mucosa: No erythema.  NECK: No JVD. No bruits. No lymphadenopathy. No thyromegaly. No thyroid nodules palpated.  LUNGS: Clear to auscultation. No use of accessory muscles to breathe. No rhonchi, rales, or wheeze heard.  CARDIOVASCULAR: S1, S2 normal. No gallops, rubs, or murmurs heard. Carotid upstroke 2+ bilaterally. No bruits.  EXTREMITIES: Dorsalis pedis pulses 2+ bilaterally. No edema of the lower extremity.  ABDOMEN: Soft, nontender. No organosplenomegaly. Normoactive bowel sounds. No masses felt.  LYMPHATIC: No lymph nodes in the neck.  MUSCULOSKELETAL: No clubbing, edema or cyanosis.  SKIN: No ulcers or lesions seen.  NEUROLOGIC: Difficult to test secondary to altered mental status and sedation, but she is moving all extremities.  PSYCHIATRIC: Difficult to test secondary to altered mental status, but she is moving all extremities.   DIAGNOSTIC DATA: TSH 0.21. Salicylate 44. White blood cell count 4.9, hemoglobin and hematocrit 11.9 and 37, platelet count 138,000.  Ethanol level negative. Glucose 90, BUN 24, creatinine 1.41, sodium 148, potassium 4.2, chloride 125, CO2 20, calcium 8.6. Liver function tests normal range. Acetaminophen level less than 2.   ASSESSMENT AND PLAN: 1.  Acute encephalopathy. Not sure if this is secondary to the medications given in the ER or salicylate poisoning. We will watch closely in the ICU overnight and watch mental status closely.  2.  Salicylate poisoning with an elevated level of 44. Toxic is above 30. Unknown time of ingestion. We will alkalinize the urine with sodium bicarb drip with dextrose in it. Continue to monitor ABCs closely. The patient unable to give me much other history at this point.  3.  Hypertension. I will hold  lisinopril at this point.  4.  Gastroesophageal reflux disease. Omeprazole when able.  5.  Manic episode. Will need psychiatric consultation.  6.  Chronic kidney disease. We will give IV fluids at this point.  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  7.  Hypernatremia. This should come down with IV fluid hydration with the bicarb.  8.  Thrombocytopenia. This can happen with salicylate poisoning so will watch closely. Looking back at old records, she has had some low platelet counts and some normal platelet counts. Will continue to monitor.  The patient will be admitted to the CCU.  I did try to call family. Son's phone number was busy at this point. Unable to determine code status at this point.  TIME SPENT ON ADMISSION: 50 minutes.   ____________________________ Tana Conch. Leslye Peer, MD rjw:sb D: 10/16/2013 16:01:07 ET T: 10/16/2013 16:50:36 ET JOB#: [id number]  cc: Tana Conch. Leslye Peer, MD, <Dictator> Tama High III, MD Marisue Brooklyn MD ELECTRONICALLY SIGNED 10/17/2013 10:50

## 2014-09-18 NOTE — Discharge Summary (Signed)
PATIENT NAME:  Cheryl Hall, Cheryl Hall MR#:  [id number] DATE OF BIRTH:  [birth year]  DATE OF ADMISSION:  10/16/2013 DATE OF DISCHARGE:  10/19/2013  DISCHARGE DIAGNOSES:  1. Altered mental status encephalopathy secondary to salicylate intoxication.  2. Hypertension.  3. Gastroesophageal reflux disease.  4. History of mania.  5. Chronic kidney disease.  6. Hyponatremia.  7. Thrombocytopenia.   CHIEF COMPLAINT: Increased restlessness with agitation, inability to sleep and the patient noted to have an elevated salicylate level.  [name] is a 74 year old female with history of vitamin D deficiency, hyperlipidemia, hypertension, GERD,  osteoporosis, chronic kidney disease and anxiety and agitation who was sent from the [facility] to the Emergency Room after she was noted to be manic and was also increasingly agitated with difficulty sleeping. The patient had received Ativan and Haldol in the ED and was subsequently admitted to the Critical Care Unit because of elevated salicylate level of 44.   PAST MEDICAL HISTORY: Significant for vitamin D deficiency, hyperlipidemia, hypertension, GERD, osteoporosis, chronic kidney disease. The patient was placed in Critical Care Unit and received bicarbonate drip. Her lisinopril was initially held. The patient's mental status gradually improved and she was subsequently transferred to the floor. A psych consult was also done. She was involuntary committed and subsequently noted to be stable. Mental status is also stable. Discussions were held with her son and the patient was discharged in stable condition on the following medications.   DISCHARGE MEDICATIONS: Lisinopril 20 mg once a day, omeprazole 20 mg once a day, Risperdal 0.5 mg twice a day. Calcium plus vitamin D 1 tablet b.i.d., ferrous sulfate 325 mg once a day, vitamin B12. The patient's salicylate level gradually improved from 44 down to 5.2 and she was ambulated and mental status significantly improved and she  was stable at the time of discharge. Her kidney function also improved to normal levels. She has been advised to follow up with PA Boykin Reaper in 1 to 2 weeks' time and call back with any questions or concerns.   Total time spent in discharge of this patient 30 minutes  ____________________________ Tracie Harrier, MD vh:sg D: 10/19/2013 12:03:16 ET T: 10/19/2013 12:11:19 ET JOB#: [id number]  cc: Tracie Harrier, MD, <Dictator> Tracie Harrier MD ELECTRONICALLY SIGNED 11/03/2013 13:33

## 2014-10-20 ENCOUNTER — Encounter: Payer: Self-pay | Admitting: General Practice

## 2014-10-20 ENCOUNTER — Inpatient Hospital Stay
Admission: EM | Admit: 2014-10-20 | Discharge: 2014-10-21 | DRG: 282 | Disposition: A | Discharge status: Home / Self Care | Payer: Commercial Managed Care - HMO | Attending: Specialist | Admitting: Specialist

## 2014-10-20 ENCOUNTER — Emergency Department: Payer: Commercial Managed Care - HMO

## 2014-10-20 DIAGNOSIS — I2510 Atherosclerotic heart disease of native coronary artery without angina pectoris: Secondary | ICD-10-CM | POA: Diagnosis present

## 2014-10-20 DIAGNOSIS — N182 Chronic kidney disease, stage 2 (mild): Secondary | ICD-10-CM | POA: Diagnosis present

## 2014-10-20 DIAGNOSIS — R0789 Other chest pain: Secondary | ICD-10-CM | POA: Diagnosis not present

## 2014-10-20 DIAGNOSIS — I10 Essential (primary) hypertension: Secondary | ICD-10-CM | POA: Diagnosis present

## 2014-10-20 DIAGNOSIS — Z720 Tobacco use: Secondary | ICD-10-CM | POA: Diagnosis present

## 2014-10-20 DIAGNOSIS — K219 Gastro-esophageal reflux disease without esophagitis: Secondary | ICD-10-CM | POA: Diagnosis not present

## 2014-10-20 DIAGNOSIS — R0602 Shortness of breath: Secondary | ICD-10-CM | POA: Diagnosis not present

## 2014-10-20 DIAGNOSIS — I129 Hypertensive chronic kidney disease with stage 1 through stage 4 chronic kidney disease, or unspecified chronic kidney disease: Secondary | ICD-10-CM | POA: Diagnosis not present

## 2014-10-20 DIAGNOSIS — E785 Hyperlipidemia, unspecified: Secondary | ICD-10-CM | POA: Diagnosis present

## 2014-10-20 DIAGNOSIS — R079 Chest pain, unspecified: Secondary | ICD-10-CM | POA: Diagnosis not present

## 2014-10-20 DIAGNOSIS — I214 Non-ST elevation (NSTEMI) myocardial infarction: Principal | ICD-10-CM | POA: Diagnosis present

## 2014-10-20 HISTORY — DX: Gastro-esophageal reflux disease without esophagitis: K21.9

## 2014-10-20 HISTORY — DX: Personal history of other mental and behavioral disorders: Z86.59

## 2014-10-20 HISTORY — DX: Anemia, unspecified: D64.9

## 2014-10-20 HISTORY — DX: Personal history of other diseases of the nervous system and sense organs: Z86.69

## 2014-10-20 HISTORY — DX: Dorsalgia, unspecified: M54.9

## 2014-10-20 HISTORY — DX: Polyneuropathy, unspecified: G62.9

## 2014-10-20 HISTORY — DX: Hyperlipidemia, unspecified: E78.5

## 2014-10-20 HISTORY — DX: Chronic kidney disease, stage 2 (mild): N18.2

## 2014-10-20 HISTORY — DX: Other chronic pain: G89.29

## 2014-10-20 HISTORY — DX: Essential (primary) hypertension: I10

## 2014-10-20 HISTORY — DX: Personal history of (healed) traumatic fracture: Z87.81

## 2014-10-20 LAB — BASIC METABOLIC PANEL
Anion gap: 6 (ref 5–15)
BUN: 14 mg/dL (ref 6–20)
CO2: 26 mmol/L (ref 22–32)
Calcium: 9.1 mg/dL (ref 8.9–10.3)
Chloride: 108 mmol/L (ref 101–111)
Creatinine, Ser: 0.91 mg/dL (ref 0.44–1.00)
GFR calc non Af Amer: >60 mL/min (ref >60)
Glucose, Bld: 154 mg/dL — ABNORMAL HIGH (ref 65–99)
POTASSIUM: 3.8 mmol/L (ref 3.5–5.1)
SODIUM: 140 mmol/L (ref 135–145)

## 2014-10-20 LAB — CBC
HEMATOCRIT: 43.6 % (ref 35.0–47.0)
HEMOGLOBIN: 14.5 g/dL (ref 12.0–16.0)
MCH: 32.2 pg (ref 26.0–34.0)
MCHC: 33.2 g/dL (ref 32.0–36.0)
MCV: 97.1 fL (ref 80.0–100.0)
Platelets: 151 10*3/uL (ref 150–440)
RBC: 4.5 MIL/uL (ref 3.80–5.20)
RDW: 13.7 % (ref 11.5–14.5)
WBC: 8.5 10*3/uL (ref 3.6–11.0)

## 2014-10-20 LAB — TROPONIN I
TROPONIN I: 0.06 ng/mL — AB (ref <0.031)
TROPONIN I: 0.26 ng/mL — AB (ref <0.031)

## 2014-10-20 LAB — BRAIN NATRIURETIC PEPTIDE: B Natriuretic Peptide: 72 pg/mL (ref 0.0–100.0)

## 2014-10-20 MED ORDER — SODIUM CHLORIDE 0.9 % IJ SOLN
3.0000 mL | Freq: Two times a day (BID) | INTRAMUSCULAR | Status: DC
  Administered 2014-10-20: 3 mL via INTRAVENOUS

## 2014-10-20 MED ORDER — SODIUM CHLORIDE 0.9 % IV SOLN: 250.0000 mL | INTRAVENOUS | Status: DC | PRN

## 2014-10-20 MED ORDER — ATORVASTATIN CALCIUM 20 MG PO TABS: 40.0000 mg | ORAL_TABLET | Freq: Every day | ORAL | Status: DC

## 2014-10-20 MED ORDER — ASPIRIN 81 MG PO CHEW
CHEWABLE_TABLET | ORAL | Status: AC
  Administered 2014-10-20: 324 mg via ORAL
  Filled 2014-10-20: qty 4

## 2014-10-20 MED ORDER — HYDRALAZINE HCL 20 MG/ML IJ SOLN: 10.0000 mg | Freq: Four times a day (QID) | INTRAMUSCULAR | Status: DC | PRN

## 2014-10-20 MED ORDER — LISINOPRIL 20 MG PO TABS
20.0000 mg | ORAL_TABLET | Freq: Every day | ORAL | Status: DC
  Administered 2014-10-21: 20 mg via ORAL
  Filled 2014-10-20: qty 1

## 2014-10-20 MED ORDER — SODIUM CHLORIDE 0.9 % IJ SOLN: 3.0000 mL | INTRAMUSCULAR | Status: DC | PRN

## 2014-10-20 MED ORDER — NITROGLYCERIN 0.4 MG SL SUBL: 0.4000 mg | SUBLINGUAL_TABLET | SUBLINGUAL | Status: DC | PRN

## 2014-10-20 MED ORDER — ONDANSETRON HCL 4 MG/2ML IJ SOLN: 4.0000 mg | Freq: Four times a day (QID) | INTRAMUSCULAR | Status: DC | PRN

## 2014-10-20 MED ORDER — ACETAMINOPHEN 325 MG PO TABS: 650.0000 mg | ORAL_TABLET | ORAL | Status: DC | PRN

## 2014-10-20 MED ORDER — HEPARIN (PORCINE) IN NACL 100-0.45 UNIT/ML-% IJ SOLN
12.0000 [IU]/kg/h | INTRAMUSCULAR | Status: DC
  Administered 2014-10-20: 12 [IU]/kg/h via INTRAVENOUS
  Filled 2014-10-20 (×2): qty 250

## 2014-10-20 MED ORDER — ASPIRIN EC 81 MG PO TBEC
81.0000 mg | DELAYED_RELEASE_TABLET | Freq: Every day | ORAL | Status: DC
  Administered 2014-10-21: 81 mg via ORAL
  Filled 2014-10-20: qty 1

## 2014-10-20 MED ORDER — RISPERIDONE 0.5 MG PO TABS
0.5000 mg | ORAL_TABLET | Freq: Two times a day (BID) | ORAL | Status: DC
  Filled 2014-10-20: qty 1

## 2014-10-20 MED ORDER — HEPARIN BOLUS VIA INFUSION
3400.0000 [IU] | Freq: Once | INTRAVENOUS | Status: AC
  Administered 2014-10-20: 3400 [IU] via INTRAVENOUS
  Filled 2014-10-20: qty 3400

## 2014-10-20 MED ORDER — ASPIRIN 81 MG PO CHEW
324.0000 mg | CHEWABLE_TABLET | Freq: Once | ORAL | Status: AC
  Administered 2014-10-20: 324 mg via ORAL

## 2014-10-20 MED ORDER — NICOTINE 14 MG/24HR TD PT24
14.0000 mg | MEDICATED_PATCH | Freq: Every day | TRANSDERMAL | Status: DC
  Administered 2014-10-20 – 2014-10-21 (×2): 14 mg via TRANSDERMAL
  Filled 2014-10-20 (×2): qty 1

## 2014-10-20 MED ORDER — ALPRAZOLAM 0.25 MG PO TABS: 0.2500 mg | ORAL_TABLET | Freq: Two times a day (BID) | ORAL | Status: DC | PRN

## 2014-10-20 MED ORDER — METOPROLOL TARTRATE 25 MG PO TABS
12.5000 mg | ORAL_TABLET | Freq: Two times a day (BID) | ORAL | Status: DC
  Administered 2014-10-21: 12.5 mg via ORAL
  Filled 2014-10-20 (×2): qty 1

## 2014-10-20 MED ORDER — PANTOPRAZOLE SODIUM 40 MG PO TBEC
40.0000 mg | DELAYED_RELEASE_TABLET | Freq: Every day | ORAL | Status: DC
  Administered 2014-10-21: 40 mg via ORAL
  Filled 2014-10-20: qty 1

## 2014-10-20 NOTE — Progress Notes (Signed)
ANTICOAGULATION CONSULT NOTE - Initial Consult  Pharmacy Consult for Heparin Indication: chest pain/ACS  No Known Allergies  Patient Measurements: Height: 5\' 3"  (160 cm) Weight: 114 lb 9.6 oz (51.982 kg) IBW/kg (Calculated) : 52.4   Vital Signs: Temp: 97.9 F (36.6 C) (05/25 2040) Temp Source: Oral (05/25 2040) BP: 188/87 mmHg (05/25 2040) Pulse Rate: 66 (05/25 2040)  Labs:  Recent Labs  10/20/14 1349 10/20/14 1746  HGB 14.5  --   HCT 43.6  --   PLT 151  --   CREATININE 0.91  --   TROPONINI 0.06* 0.26*    Estimated Creatinine Clearance: 46.5 mL/min (by C-G formula based on Cr of 0.91).   Medical History: Past Medical History  Diagnosis Date  . Hypertension   . High cholesterol     Medications:  Prescriptions prior to admission  Medication Sig Dispense Refill Last Dose  . acetaminophen (TYLENOL) 500 MG tablet Take 500 mg by mouth every 6 (six) hours as needed for mild pain or headache.    10/20/2014 at 0700  . Cholecalciferol (VITAMIN D-3 PO) Take 1 tablet by mouth daily.   10/20/2014 at Unknown time  . lisinopril (PRINIVIL,ZESTRIL) 20 MG tablet Take 20 mg by mouth daily.   10/20/2014 at 0700  . omeprazole (PRILOSEC) 20 MG capsule Take 20 mg by mouth daily.   10/20/2014 at Unknown time  . simvastatin (ZOCOR) 40 MG tablet Take 40 mg by mouth daily.   10/19/2014 at Unknown time  . vitamin B-12 (CYANOCOBALAMIN) 1000 MCG tablet Take 1,000 mcg by mouth daily.   10/20/2014 at Unknown time  . risperiDONE (RISPERDAL) 0.5 MG tablet Take 0.5 mg by mouth 2 (two) times daily.   Not Taking at Unknown time   Scheduled:  . [START ON 10/21/2014] aspirin EC  81 mg Oral Daily  . [START ON 10/21/2014] atorvastatin  40 mg Oral q1800  . heparin  3,400 Units Intravenous Once  . [START ON 10/21/2014] lisinopril  20 mg Oral Daily  . metoprolol tartrate  12.5 mg Oral BID  . nicotine  14 mg Transdermal Daily  . pantoprazole  40 mg Oral Daily  . risperiDONE  0.5 mg Oral BID  . sodium  chloride  3 mL Intravenous Q12H   Infusions:  . heparin      Assessment: Heparin for NSTEMI  Goal of Therapy:  Heparin level 0.3-0.7 units/ml Monitor platelets by anticoagulation protocol: Yes   Plan:  Give 3400 units bolus x 1 Start heparin infusion at 700 units/hr Check anti-Xa level in 8 hours and daily while on heparin Continue to monitor H&H and platelets  Ulice Dash D 10/20/2014,8:47 PM

## 2014-10-20 NOTE — ED Provider Notes (Signed)
[facility] Emergency Department Provider Note  ____________________________________________  Time seen: 3:40 PM  I have reviewed the triage vital signs and the nursing notes.   HISTORY  Chief Complaint Chest Pain      HPI Cheryl Hall is a 74 y.o. female who presents with chest pain which began this morning approximately 9 AM with pressure like heaviness across the center of her chest which was moderate to severe. She felt short of breath at the time and nauseous. She does note that the pain has improved somewhat but still feels the pain in the center of her chest and left shoulder. She does admit to smoking. She does have a brother who has coronary artery disease. She also has a history of high blood pressure.     Past Medical History  Diagnosis Date  . Hypertension   . High cholesterol     There are no active problems to display for this patient.   Past Surgical History  Procedure Laterality Date  . Cholecystectomy      No current outpatient prescriptions on file.  Allergies Review of patient's allergies indicates no known allergies.  No family history on file.  Social History History  Substance Use Topics  . Smoking status: Current Every Day Smoker -- 1.00 packs/day    Types: Cigarettes  . Smokeless tobacco: Not on file  . Alcohol Use: No    Review of Systems  Constitutional: Negative for fever. Eyes: Negative for visual changes. ENT: Negative for sore throat Cardiovascular: Positive for chest pain Respiratory: Positive for shortness of breath Gastrointestinal: Negative for abdominal pain, vomiting and diarrhea. Genitourinary: Negative for dysuria. Musculoskeletal: Negative for back pain. Skin: Negative for rash. Neurological: Negative for headaches or focal weakness   10-point ROS otherwise negative.  ____________________________________________   PHYSICAL EXAM:  VITAL SIGNS: ED Triage Vitals  Enc Vitals Group    BP 10/20/14 1341 170/84 mmHg     Pulse Rate 10/20/14 1341 80     Resp 10/20/14 1341 17     Temp 10/20/14 1341 98.3 F (36.8 C)     Temp src --      SpO2 10/20/14 1341 95 %     Weight 10/20/14 1341 125 lb (56.7 kg)     Height 10/20/14 1341 5\' 3"  (1.6 m)     Head Cir --      Peak Flow --      Pain Score 10/20/14 1342 0     Pain Loc --      Pain Edu? --      Excl. in [place]? --      Constitutional: Alert and oriented. Well appearing and in no distress. Eyes: Conjunctivae are normal. PERRL. ENT   Head: Normocephalic and atraumatic.   Nose: No rhinnorhea.   Mouth/Throat: Mucous membranes are moist. Cardiovascular: Normal rate, regular rhythm. Normal and symmetric distal pulses are present in all extremities. No murmurs, rubs, or gallops. Respiratory: Normal respiratory effort without tachypnea nor retractions. Breath sounds are clear and equal bilaterally.  Gastrointestinal: Soft and non-tender in all quadrants. No distention. There is no CVA tenderness. Genitourinary: deferred Musculoskeletal: Nontender with normal range of motion in all extremities. No lower extremity tenderness nor edema. Neurologic:  Normal speech and language. No gross focal neurologic deficits are appreciated. Skin:  Skin is warm, dry and intact. No rash noted. Psychiatric: Mood and affect are normal. Patient exhibits appropriate insight and judgment.  ____________________________________________    LABS (pertinent positives/negatives)  Labs Reviewed  BASIC METABOLIC PANEL - Abnormal; Notable for the following:    Glucose, Bld 154 (*)    All other components within normal limits  TROPONIN I - Abnormal; Notable for the following:    Troponin I 0.06 (*)    All other components within normal limits  CBC  BRAIN NATRIURETIC PEPTIDE    ____________________________________________   EKG  ED ECG REPORT I, Lavonia Drafts, the attending physician, personally viewed and interpreted this ECG.    Date: 10/20/2014  EKG Time: 1:45 PM  Rate: 97  Rhythm: normal sinus rhythm  Axis: Normal axis  Intervals:none  ST&T Change: T wave inversion in lead 3   ____________________________________________    RADIOLOGY  Chest x-ray no acute distress  ____________________________________________   PROCEDURES  Procedure(s) performed: none  Critical Care performed:  CRITICAL CARE Performed by: Lavonia Drafts   Total critical care time: 30 min Critical care time was exclusive of separately billable procedures and treating other patients.  Critical care was necessary to treat or prevent imminent or life-threatening deterioration.  Critical care was time spent personally by me on the following activities: development of treatment plan with patient and/or surrogate as well as nursing, discussions with consultants, evaluation of patient's response to treatment, examination of patient, obtaining history from patient or surrogate, ordering and performing treatments and interventions, ordering and review of laboratory studies, ordering and review of radiographic studies, pulse oximetry and re-evaluation of patient's condition.   ____________________________________________   INITIAL IMPRESSION / ASSESSMENT AND PLAN / ED COURSE  Pertinent labs & imaging results that were available during my care of the patient were reviewed by me and considered in my medical decision making (see chart for details).  Patient is adamant about not wanting admission but she does agree to stay for a second troponin given her risk factors.   ----------------------------------------- 7:11 PM on 10/20/2014 -----------------------------------------  Discussed with patient 2nd elevated troponin in my concern. She is willing to stay. Aspirin given in the ED. She has no chest pain this time so will not start heparin but will discuss with hospitalist ____________________________________________   FINAL CLINICAL  IMPRESSION(S) / ED DIAGNOSES  Final diagnoses:  NSTEMI (non-ST elevated myocardial infarction)     Lavonia Drafts, MD 10/20/14 2143

## 2014-10-20 NOTE — ED Notes (Signed)
Pt. Arrived to ed from home with reports of " not feeling right". Pt reports that she woke up this morning, around 9am with centralized chest pain. Pt reports slight SOB and nausea. Pt alert and oriented.

## 2014-10-20 NOTE — H&P (Signed)
[place] [facility] [place] NAME: Cheryl Hall    MR#:  [id number]  DATE OF BIRTH:  1941-01-22  DATE OF ADMISSION:  10/20/2014  PRIMARY CARE PHYSICIAN: No primary care provider on file.   REQUESTING/REFERRING PHYSICIAN: Dr. Corky Downs  CHIEF COMPLAINT:   Chief Complaint  Patient presents with  . Chest Pain    HISTORY OF PRESENT ILLNESS:  Cheryl Hall  is a 74 y.o. female with a known history of hypertension and hyperlipidemia. The patient is alert awake and oriented. The patient had the chest pain this morning, which is aching and across the chest with radiation to the left shoulder.the patient also has a nausea but no vomiting or diaphoresis. The patient denies any other symptoms. Her troponin level was 0.06 and then increased to 0.24. She was treated with aspirin in ED.  PAST MEDICAL HISTORY:   Past Medical History  Diagnosis Date  . Hypertension   . High cholesterol     PAST SURGICAL HISTORY:   Past Surgical History  Procedure Laterality Date  . Cholecystectomy      SOCIAL HISTORY:   History  Substance Use Topics  . Smoking status: Current Every Day Smoker -- 1.00 packs/day    Types: Cigarettes  . Smokeless tobacco: Not on file  . Alcohol Use: No    FAMILY HISTORY:  No family history on file.The patient's brother had a heart disease, but she doesn't know what kind of heart disease.  DRUG ALLERGIES:  No Known Allergies  REVIEW OF SYSTEMS:  CONSTITUTIONAL: No fever, fatigue or weakness.  EYES: No blurred or double vision.  EARS, NOSE, AND THROAT: No tinnitus or ear pain.  RESPIRATORY: No cough, shortness of breath, wheezing or hemoptysis.  CARDIOVASCULAR:Positive for  chest pain, orthopnea, edema.  GASTROINTESTINAL:Positive for nausea, but no  vomiting, diarrhea or abdominal pain.  GENITOURINARY: No dysuria, hematuria.  ENDOCRINE: No polyuria, nocturia,  HEMATOLOGY: No anemia, easy bruising or bleeding SKIN: No rash or  lesion. MUSCULOSKELETAL: No joint pain or arthritis.   NEUROLOGIC: No tingling, numbness, weakness.  PSYCHIATRY: No anxiety or depression.   MEDICATIONS AT HOME:   Prior to Admission medications   Medication Sig Start Date End Date Taking? Authorizing Provider  acetaminophen (TYLENOL) 500 MG tablet Take 500 mg by mouth every 6 (six) hours as needed for mild pain or headache.    Yes Historical Provider, MD  Cholecalciferol (VITAMIN D-3 PO) Take 1 tablet by mouth daily.   Yes Historical Provider, MD  lisinopril (PRINIVIL,ZESTRIL) 20 MG tablet Take 20 mg by mouth daily.   Yes Historical Provider, MD  omeprazole (PRILOSEC) 20 MG capsule Take 20 mg by mouth daily.   Yes Historical Provider, MD  risperiDONE (RISPERDAL) 0.5 MG tablet Take 0.5 mg by mouth 2 (two) times daily.   Yes Historical Provider, MD  simvastatin (ZOCOR) 40 MG tablet Take 40 mg by mouth daily.   Yes Historical Provider, MD  vitamin B-12 (CYANOCOBALAMIN) 1000 MCG tablet Take 1,000 mcg by mouth daily.   Yes Historical Provider, MD      VITAL SIGNS:  Blood pressure 168/79, pulse 62, temperature 98.3 F (36.8 C), resp. rate 14, height 5\' 3"  (1.6 m), weight 56.7 kg (125 lb), SpO2 94 %.  PHYSICAL EXAMINATION:  GENERAL:  74 y.o.-year-old patient lying in the bed with no acute distress.  EYES: Pupils equal, round, reactive to light and accommodation. No scleral icterus. Extraocular muscles intact.  HEENT: Head atraumatic, normocephalic. Oropharynx and  nasopharynx clear. Moist oral mucosa. NECK:  Supple, no jugular venous distention. No thyroid enlargement, no tenderness.  LUNGS: Normal breath sounds bilaterally, no wheezing, rales,rhonchi or crepitation. No use of accessory muscles of respiration.  CARDIOVASCULAR: S1, S2 normal. No murmurs, rubs, or gallops.  ABDOMEN: Soft, nontender, nondistended. Bowel sounds present. No organomegaly or mass.  EXTREMITIES: No pedal edema, cyanosis, or clubbing.  NEUROLOGIC: Cranial nerves II  through XII are intact. Muscle strength 5/5 in all extremities. Sensation intact. Gait not checked.  PSYCHIATRIC: The patient is alert and oriented x 3.  SKIN: No obvious rash, lesion, or ulcer.   LABORATORY PANEL:   CBC  Recent Labs Lab 10/20/14 1349  WBC 8.5  HGB 14.5  HCT 43.6  PLT 151   ------------------------------------------------------------------------------------------------------------------  Chemistries   Recent Labs Lab 10/20/14 1349  NA 140  K 3.8  CL 108  CO2 26  GLUCOSE 154*  BUN 14  CREATININE 0.91  CALCIUM 9.1   ------------------------------------------------------------------------------------------------------------------  Cardiac Enzymes  Recent Labs Lab 10/20/14 1746  TROPONINI 0.26*   ------------------------------------------------------------------------------------------------------------------  RADIOLOGY:  Dg Chest 1 View  10/20/2014   CLINICAL DATA:  Chest pain and shortness of breath beginning today.  EXAM: CHEST  1 VIEW  COMPARISON:  Single view of the chest 08/12/2012.  FINDINGS: Heart size is upper normal. Minimal subsegmental atelectasis is seen in the left lung base. The lungs are otherwise clear. No pneumothorax or pleural effusion. Thoracolumbar scoliosis noted.  IMPRESSION: No acute disease.   Electronically Signed   By: Inge Rise M.D.   On: 10/20/2014 16:04    EKG:   Orders placed or performed during the hospital encounter of 10/20/14  . ED EKG (<40mins upon arrival to the ED)  . ED EKG (<78mins upon arrival to the ED)    IMPRESSION AND PLAN:   NSTEMI Hypertension Hyperlipidemia Tobacco abuse  Plan:  The patient will be admitted to telemetry floor. I will start heparin drip dose per pharmacy, follow-up troponin level, continue aspirin, statin and nitroglycerin when necessary. I will ask the cardiology consult. In addition, I will continue patient home medication lisinopril and a Lopressor. Smoking  cessation was counseled for 4 minutes, we have given nicotine patch.   All the records are reviewed and case discussed with ED provider. Management plans discussed with the patient, family [name] is  in agreement and wants full code.  CODE STATUS: Full code   TOTAL TIME TAKING CARE OF THIS PATIENT: 57 minutes.    Demetrios Loll M.D on 10/20/2014 at 7:40 PM  Between 7am to 6pm - Pager - [phone]  After 6pm go to [email or web address] - password [email or web address] [facility]  [facility]  Office  [phone]  CC: Primary care physician; No primary care provider on file.

## 2014-10-20 NOTE — ED Notes (Signed)
Pt given meal & coffee; didn't eat meal - stated she doesn't like hospital food but thanked me for trying.

## 2014-10-20 NOTE — ED Notes (Signed)
First Nurse, Claiborne Billings RN notified about pt's troponin

## 2014-10-20 NOTE — ED Notes (Signed)
CRITICAL VALUE ALERT  Critical value received:  Troponin 0.06  Date of notification:  10/20/14  Time of notification:  6681  Critical value read back:Yes.    Nurse who received alert:  Romie Minus   MD notified (1st page):  Dr. Jimmye Norman   Time of first page: 1421  MD notified (2nd page):  Time of second page:  Responding MD:  Dr. Jimmye Norman   Time MD responded:  [id number]

## 2014-10-21 ENCOUNTER — Encounter: Payer: Self-pay | Admitting: Nurse Practitioner

## 2014-10-21 ENCOUNTER — Encounter
Admission: EM | Disposition: A | Discharge status: Home / Self Care | Payer: Self-pay | Source: Home / Self Care | Attending: Internal Medicine

## 2014-10-21 DIAGNOSIS — I214 Non-ST elevation (NSTEMI) myocardial infarction: Principal | ICD-10-CM

## 2014-10-21 DIAGNOSIS — N182 Chronic kidney disease, stage 2 (mild): Secondary | ICD-10-CM | POA: Diagnosis present

## 2014-10-21 DIAGNOSIS — Z720 Tobacco use: Secondary | ICD-10-CM | POA: Diagnosis present

## 2014-10-21 DIAGNOSIS — E785 Hyperlipidemia, unspecified: Secondary | ICD-10-CM | POA: Diagnosis present

## 2014-10-21 DIAGNOSIS — K219 Gastro-esophageal reflux disease without esophagitis: Secondary | ICD-10-CM | POA: Diagnosis present

## 2014-10-21 DIAGNOSIS — I10 Essential (primary) hypertension: Secondary | ICD-10-CM | POA: Diagnosis present

## 2014-10-21 DIAGNOSIS — I2510 Atherosclerotic heart disease of native coronary artery without angina pectoris: Secondary | ICD-10-CM

## 2014-10-21 HISTORY — PX: CARDIAC CATHETERIZATION: SHX172

## 2014-10-21 LAB — BASIC METABOLIC PANEL
ANION GAP: 8 (ref 5–15)
BUN: 16 mg/dL (ref 6–20)
CO2: 26 mmol/L (ref 22–32)
CREATININE: 0.87 mg/dL (ref 0.44–1.00)
Calcium: 8.7 mg/dL — ABNORMAL LOW (ref 8.9–10.3)
Chloride: 108 mmol/L (ref 101–111)
GFR calc non Af Amer: >60 mL/min (ref >60)
Glucose, Bld: 89 mg/dL (ref 65–99)
Potassium: 3.9 mmol/L (ref 3.5–5.1)
Sodium: 142 mmol/L (ref 135–145)

## 2014-10-21 LAB — LIPID PANEL
CHOL/HDL RATIO: 2.6 ratio
Cholesterol: 143 mg/dL (ref 0–200)
HDL: 56 mg/dL (ref >40)
LDL Cholesterol: 69 mg/dL (ref 0–99)
Triglycerides: 88 mg/dL (ref <150)
VLDL: 18 mg/dL (ref 0–40)

## 2014-10-21 LAB — CBC
HEMATOCRIT: 42.3 % (ref 35.0–47.0)
Hemoglobin: 14.2 g/dL (ref 12.0–16.0)
MCH: 33 pg (ref 26.0–34.0)
MCHC: 33.6 g/dL (ref 32.0–36.0)
MCV: 98.3 fL (ref 80.0–100.0)
Platelets: 128 10*3/uL — ABNORMAL LOW (ref 150–440)
RBC: 4.3 MIL/uL (ref 3.80–5.20)
RDW: 13.9 % (ref 11.5–14.5)
WBC: 7.3 10*3/uL (ref 3.6–11.0)

## 2014-10-21 LAB — PROTIME-INR
INR: 1.08
INR: 1.05
PROTHROMBIN TIME: 13.9 s (ref 11.4–15.0)
PROTHROMBIN TIME: 14.2 s (ref 11.4–15.0)

## 2014-10-21 LAB — TROPONIN I: Troponin I: 0.49 ng/mL — ABNORMAL HIGH (ref <0.031)

## 2014-10-21 LAB — HEPARIN LEVEL (UNFRACTIONATED): Heparin Unfractionated: 0.24 IU/mL — ABNORMAL LOW (ref 0.30–0.70)

## 2014-10-21 LAB — APTT: aPTT: 102 seconds — ABNORMAL HIGH (ref 24–36)

## 2014-10-21 SURGERY — LEFT HEART CATH AND CORONARY ANGIOGRAPHY
Anesthesia: Moderate Sedation

## 2014-10-21 MED ORDER — SODIUM CHLORIDE 0.9 % IV SOLN: 250.0000 mL | INTRAVENOUS | Status: DC | PRN

## 2014-10-21 MED ORDER — ATORVASTATIN CALCIUM 20 MG PO TABS: 80.0000 mg | ORAL_TABLET | Freq: Every day | ORAL | Status: DC

## 2014-10-21 MED ORDER — SODIUM CHLORIDE 0.9 % IJ SOLN: 3.0000 mL | Freq: Two times a day (BID) | INTRAMUSCULAR | Status: DC

## 2014-10-21 MED ORDER — MIDAZOLAM HCL 2 MG/2ML IJ SOLN
INTRAMUSCULAR | Status: DC | PRN
  Administered 2014-10-21 (×2): 0.5 mg via INTRAVENOUS

## 2014-10-21 MED ORDER — SODIUM CHLORIDE 0.9 % IV SOLN
INTRAVENOUS | Status: DC
  Administered 2014-10-21: 11:00:00 via INTRAVENOUS

## 2014-10-21 MED ORDER — HEPARIN SODIUM (PORCINE) 1000 UNIT/ML IJ SOLN
INTRAMUSCULAR | Status: AC
  Filled 2014-10-21: qty 1

## 2014-10-21 MED ORDER — VERAPAMIL HCL 2.5 MG/ML IV SOLN
INTRAVENOUS | Status: DC | PRN
  Administered 2014-10-21: 13:00:00 via INTRA_ARTERIAL

## 2014-10-21 MED ORDER — HEPARIN (PORCINE) IN NACL 100-0.45 UNIT/ML-% IJ SOLN
700.0000 [IU]/h | INTRAMUSCULAR | Status: DC
  Administered 2014-10-21: 700 [IU]/h via INTRAVENOUS
  Filled 2014-10-21: qty 250

## 2014-10-21 MED ORDER — CARVEDILOL 6.25 MG PO TABS: 6.2500 mg | ORAL_TABLET | Freq: Two times a day (BID) | ORAL | Status: DC

## 2014-10-21 MED ORDER — ATORVASTATIN CALCIUM 40 MG PO TABS
40.0000 mg | ORAL_TABLET | Freq: Every day | ORAL | Status: DC
  Filled 2014-10-21: qty 1

## 2014-10-21 MED ORDER — FENTANYL CITRATE (PF) 100 MCG/2ML IJ SOLN
INTRAMUSCULAR | Status: DC | PRN
  Administered 2014-10-21: 25 ug via INTRAVENOUS

## 2014-10-21 MED ORDER — ASPIRIN 81 MG PO TBEC: 81.0000 mg | DELAYED_RELEASE_TABLET | Freq: Every day | ORAL | Status: DC

## 2014-10-21 MED ORDER — SODIUM CHLORIDE 0.9 % WEIGHT BASED INFUSION: 1.0000 mL/kg/h | INTRAVENOUS | Status: AC

## 2014-10-21 MED ORDER — IOHEXOL 300 MG/ML  SOLN
INTRAMUSCULAR | Status: DC | PRN
  Administered 2014-10-21: 65 mL via INTRAVENOUS

## 2014-10-21 MED ORDER — VERAPAMIL HCL 2.5 MG/ML IV SOLN
INTRAVENOUS | Status: AC
  Filled 2014-10-21: qty 2

## 2014-10-21 MED ORDER — HEPARIN SODIUM (PORCINE) 1000 UNIT/ML IJ SOLN
INTRAMUSCULAR | Status: DC | PRN
  Administered 2014-10-21: 3000 [IU] via INTRAVENOUS

## 2014-10-21 MED ORDER — SODIUM CHLORIDE 0.9 % IJ SOLN: 3.0000 mL | INTRAMUSCULAR | Status: DC | PRN

## 2014-10-21 MED ORDER — MIDAZOLAM HCL 2 MG/2ML IJ SOLN
INTRAMUSCULAR | Status: AC
  Filled 2014-10-21: qty 2

## 2014-10-21 MED ORDER — CARVEDILOL 3.125 MG PO TABS: 3.1250 mg | ORAL_TABLET | Freq: Two times a day (BID) | ORAL | Status: DC

## 2014-10-21 MED ORDER — HEPARIN BOLUS VIA INFUSION
800.0000 [IU] | Freq: Once | INTRAVENOUS | Status: AC
Start: 2014-10-21 — End: 2014-10-21
  Administered 2014-10-21: 800 [IU] via INTRAVENOUS
  Filled 2014-10-21: qty 800

## 2014-10-21 MED ORDER — FENTANYL CITRATE (PF) 100 MCG/2ML IJ SOLN
INTRAMUSCULAR | Status: AC
  Filled 2014-10-21: qty 2

## 2014-10-21 SURGICAL SUPPLY — 8 items
CATH INFINITI 5FR ANG PIGTAIL (CATHETERS) ×3 IMPLANT
CATH OPTITORQUE JACKY 4.0 5F (CATHETERS) ×3 IMPLANT
DEVICE RAD TR BAND REGULAR (VASCULAR PRODUCTS) ×3 IMPLANT
GLIDESHEATH SLEND SS 6F .021 (SHEATH) ×3 IMPLANT
GUIDEWIRE VERSACORE 175CM (WIRE) ×3 IMPLANT
KIT MANI 3VAL PERCEP (MISCELLANEOUS) ×3 IMPLANT
PACK CARDIAC CATH (CUSTOM PROCEDURE TRAY) ×3 IMPLANT
WIRE SAFE-T 1.5MM-J .035X260CM (WIRE) ×3 IMPLANT

## 2014-10-21 NOTE — Discharge Instructions (Signed)

## 2014-10-21 NOTE — Progress Notes (Signed)
ANTICOAGULATION CONSULT NOTE - Follow Up Consult  Pharmacy Consult for Heparin Indication: chest pain/ACS  No Known Allergies  Patient Measurements: Height: 5\' 3"  (160 cm) Weight: 114 lb 9.6 oz (51.982 kg) IBW/kg (Calculated) : 52.4  Vital Signs: Temp: 98.4 F (36.9 C) (05/26 0503) Temp Source: Oral (05/26 0503) BP: 168/85 mmHg (05/26 0503) Pulse Rate: 65 (05/26 0503)  Labs:  Recent Labs  10/20/14 1349 10/20/14 1746 10/20/14 2330 10/21/14 0433  HGB 14.5  --   --  14.2  HCT 43.6  --   --  42.3  PLT 151  --   --  128*  APTT  --   --  102*  --   LABPROT  --   --  14.2 13.9  INR  --   --  1.08 1.05  HEPARINUNFRC  --   --   --  0.24*  CREATININE 0.91  --   --  0.87  TROPONINI 0.06* 0.26* 0.49*  --     Estimated Creatinine Clearance: 48.7 mL/min (by C-G formula based on Cr of 0.87).   Medications:  Scheduled:  . aspirin EC  81 mg Oral Daily  . atorvastatin  40 mg Oral q1800  . lisinopril  20 mg Oral Daily  . metoprolol tartrate  12.5 mg Oral BID  . nicotine  14 mg Transdermal Daily  . pantoprazole  40 mg Oral Daily  . risperiDONE  0.5 mg Oral BID  . sodium chloride  3 mL Intravenous Q12H    Assessment: Heparin for NSTEMI Goal of Therapy:  Heparin level 0.3-0.7 units/ml Monitor platelets by anticoagulation protocol: Yes   Plan:  Heparin level resulted @ 0.24, which is subtherapeutic. Will increase heparin gtt to 700 units/hr and will give bolus of 800 units x 1 dose. Will order HL to be drawn @ 16:30.   Larene Beach, PharmD  10/21/2014,8:37 AM

## 2014-10-21 NOTE — Care Management Note (Signed)
Case Management Note  Patient Details  Name: Cheryl Hall MRN: [id number] Date of Birth: 04/10/1941  Subjective/Objective:                   Patient was resting comfortably in bed. She stated that she lives alone but has a son named Cheryl Hall that lives in [place] and will provide assistance if needed. She states she has been independent with daily needs. She goes to [facility] and see Carrie Mew NP at [facility]. She states she has cane and crutches at home but has not needed them. She states she can afford her mediactions.  Action/Plan: RNCM will continue to follow for discharge needs.   Expected Discharge Date:                  Expected Discharge Plan:     In-House Referral:     Discharge planning Services  CM Consult  Post Acute Care Choice:    Choice offered to:  Patient  DME Arranged:    DME Agency:     HH Arranged:    [place]:     Status of Service:     Medicare Important Message Given:  Yes Date Medicare IM Given:  10/21/14 Medicare IM give by:  Marshell Garfinkel Date Additional Medicare IM Given:    Additional Medicare Important Message give by:     If discussed at [place] of Stay Meetings, dates discussed:    Additional Comments:  Marshell Garfinkel, RN 10/21/2014, 12:49 PM

## 2014-10-21 NOTE — Progress Notes (Signed)
Pt discharged home after clean cath, instructed RE site care, site clean and dry, min swelling, IV sites DC, scrips given to pt, understanding verbalized. Pt leaving hospital in car with her son

## 2014-10-21 NOTE — Progress Notes (Signed)
[place] at [place] NAME: Cheryl Hall    MR#:  [id number]  DATE OF BIRTH:  1940/12/03  SUBJECTIVE:  CHIEF COMPLAINT:   Chief Complaint  Patient presents with  . Chest Pain   Pt. Here w/ chest pain which has resolved now.  Troponin's mildy elevated and seen by cards and going for cardiac cath later today.   REVIEW OF SYSTEMS:    Review of Systems  Constitutional: Negative for fever and chills.  HENT: Negative for congestion and tinnitus.   Eyes: Negative for blurred vision and double vision.  Respiratory: Negative for cough, shortness of breath and wheezing.   Cardiovascular: Negative for chest pain, orthopnea and PND.  Gastrointestinal: Negative for nausea, vomiting, abdominal pain and diarrhea.  Genitourinary: Negative for dysuria and hematuria.  Neurological: Negative for dizziness, sensory change and focal weakness.  All other systems reviewed and are negative.  Nutrition: Heart Healthy Tolerating Diet: Yes  DRUG ALLERGIES:  No Known Allergies  VITALS:  Blood pressure 168/85, pulse 67, temperature 98.4 F (36.9 C), temperature source Oral, resp. rate 18, height 5\' 3"  (1.6 m), weight 51.982 kg (114 lb 9.6 oz), SpO2 100 %.  PHYSICAL EXAMINATION:   Physical Exam  GENERAL:  74 y.o.-year-old patient lying in the bed with no acute distress.  EYES: Pupils equal, round, reactive to light and accommodation. No scleral icterus. Extraocular muscles intact.  HEENT: Head atraumatic, normocephalic. Oropharynx and nasopharynx clear.  NECK:  Supple, no jugular venous distention. No thyroid enlargement, no tenderness.  LUNGS: Normal breath sounds bilaterally, no wheezing, rales, rhonchi. No use of accessory muscles of respiration.  CARDIOVASCULAR: S1, S2 normal. No murmurs, rubs, or gallops.  ABDOMEN: Soft, nontender, nondistended. Bowel sounds present. No organomegaly or mass.  EXTREMITIES: No cyanosis, clubbing or edema b/l.     NEUROLOGIC: Cranial nerves II through XII are intact. No focal Motor or sensory deficits b/l.   PSYCHIATRIC: The patient is alert and oriented x 3.  SKIN: No obvious rash, lesion, or ulcer.    LABORATORY PANEL:   CBC  Recent Labs Lab 10/21/14 0433  WBC 7.3  HGB 14.2  HCT 42.3  PLT 128*   ------------------------------------------------------------------------------------------------------------------  Chemistries   Recent Labs Lab 10/21/14 0433  NA 142  K 3.9  CL 108  CO2 26  GLUCOSE 89  BUN 16  CREATININE 0.87  CALCIUM 8.7*   ------------------------------------------------------------------------------------------------------------------  Cardiac Enzymes  Recent Labs Lab 10/20/14 2330  TROPONINI 0.49*   ------------------------------------------------------------------------------------------------------------------  RADIOLOGY:  Dg Chest 1 View  10/20/2014   CLINICAL DATA:  Chest pain and shortness of breath beginning today.  EXAM: CHEST  1 VIEW  COMPARISON:  Single view of the chest 08/12/2012.  FINDINGS: Heart size is upper normal. Minimal subsegmental atelectasis is seen in the left lung base. The lungs are otherwise clear. No pneumothorax or pleural effusion. Thoracolumbar scoliosis noted.  IMPRESSION: No acute disease.   Electronically Signed   By: Inge Rise M.D.   On: 10/20/2014 16:04     ASSESSMENT AND PLAN:   74 year old female with past medical history of hypertension, GERD hyperlipidemia, ongoing tobacco abuse, chronic kidney disease stage II with presented to the hospital with chest pain.  1. NSTEMI - presented with chest pain and has ruled in with cardiac markers trending upwards.  - Patient is currently chest pain-free and hemodynamically stable, although her EKG did show T-wave inversions in the lateral leads. Patient has been seen by cardiology and  the plan to do a cardiac catheter later this afternoon. - Continue aspirin, heparin,  statin, beta blocker, and ACE inhibitor.  2. Hypertension - continue Coreg, lisinopril, and when necessary hydralazine  3. Hyperlipidemia - continue atorvastatin  4.  GERD - Protonix  5. Tobacco abuse - continue nicotine patch.     All the records are reviewed and case discussed with Care Management/Social Workerr. Management plans discussed with the patient, family and they are in agreement.  CODE STATUS: Full  DVT Prophylaxis: Heparin drip  TOTAL TIME TAKING CARE OF THIS PATIENT: 30 minutes.   POSSIBLE D/C IN 1-2 days DAYS, pending cardiac cath.     Cheryl Hall M.D on 10/21/2014 at 11:17 AM  Between 7am to 6pm - Pager - [phone]  After 6pm go to [email or web address] - password [email or web address] [facility]  [place] [facility]  Office  [phone]  CC: Primary care physician; No primary care provider on file.

## 2014-10-21 NOTE — Consult Note (Signed)
CARDIOLOGY CONSULT NOTE   Patient ID: Cheryl Hall MRN: [id number], DOB/AGE: 72-Feb-1942   Admit date: 10/20/2014 Date of Consult: 10/21/2014  Primary Physician: No primary care provider on file. Primary Cardiologist: new - M. Fletcher Anon, MD   Pt. Profile  74 year old female without prior cardiac history who was admitted yesterday with substernal chest pressure and has ruled in for non-ST elevation MI.  Problem List  Past Medical History  Diagnosis Date  . Essential hypertension   . Hyperlipidemia   . GERD (gastroesophageal reflux disease)   . History of mania   . Anemia   . CKD (chronic kidney disease), stage II   . Chronic back pain   . History of sciatica   . H/O Vertebral Fractures   . Peripheral neuropathy   . Tobacco abuse     Past Surgical History  Procedure Laterality Date  . Cholecystectomy       Allergies  No Known Allergies  HPI   74 year old female with the above complex problem list. She does not have any prior history of coronary artery disease but does have risk factors including hypertension, hyperlipidemia, and tobacco abuse. She lives locally by herself and is generally active. She reports that over the past several years, she has been experiencing intermittent left-sided chest pressure that radiates to her left shoulder. This typically occurs with activity and resolves with rest. Yesterday morning, she developed left-sided substernal chest pressure and tightness associated mild dyspnea and radiation to the left shoulder while waiting for the bank to open. This persisted over a period of approximately 4 hours up to her to present to the [facility]. Here, ECG showed T-wave inversion in leads 3 and aVF with delayed R-wave progression. Troponin was elevated at 0.06. She was placed on heparin and admitted for further evaluation. She's had no further chest pain. She has subsequently bumped her troponin to 0.49.  Inpatient Medications  . aspirin EC  81 mg Oral Daily   . atorvastatin  40 mg Oral q1800  . lisinopril  20 mg Oral Daily  . metoprolol tartrate  12.5 mg Oral BID  . nicotine  14 mg Transdermal Daily  . pantoprazole  40 mg Oral Daily  . risperiDONE  0.5 mg Oral BID  . sodium chloride  3 mL Intravenous Q12H    Family History Family History  Problem Relation Age of Onset  . Other Mother     died @ 53.  . Cancer Father     died of pancreatic cancer  . CAD Brother     alive in late 95's w/ h/o stenting.     Social History History   Social History  . Marital Status: Divorced    Spouse Name: N/A  . Number of Children: N/A  . Years of Education: N/A   Occupational History  . Not on file.   Social History Main Topics  . Smoking status: Current Every Day Smoker -- 1.00 packs/day for 50 years    Types: Cigarettes  . Smokeless tobacco: Not on file  . Alcohol Use: No  . Drug Use: Not on file  . Sexual Activity: Not on file   Other Topics Concern  . Not on file   Social History Narrative   Lives in [place] by herself.  Does not routinely exercise.  Son nearby.     Review of Systems  General:  No chills, fever, night sweats or weight changes.  Cardiovascular:  +++ chest pain, no dyspnea on exertion, edema,  orthopnea, palpitations, paroxysmal nocturnal dyspnea. Dermatological: No rash, lesions/masses Respiratory: No cough, dyspnea Urologic: No hematuria, dysuria Abdominal:   No nausea, vomiting, diarrhea, bright red blood per rectum, melena, or hematemesis Neurologic:  No visual changes, wkns, changes in mental status. All other systems reviewed and are otherwise negative except as noted above.  Physical Exam  Blood pressure 168/85, pulse 67, temperature 98.4 F (36.9 C), temperature source Oral, resp. rate 18, height 5\' 3"  (1.6 m), weight 114 lb 9.6 oz (51.982 kg), SpO2 100 %.  General: Pleasant, NAD Psych: Normal affect. Neuro: Alert and oriented X 3. Moves all extremities spontaneously. HEENT: Normal  Neck: Supple  without bruits or JVD. Lungs:  Resp regular and unlabored, diminished breath sounds bilaterally, otherwise clear to auscultation. Heart: RRR no s3, s4, or murmurs. Abdomen: Soft, non-tender, non-distended, BS + x 4.  Extremities: No clubbing, cyanosis or edema. DP/PT/Radials 2+ and equal bilaterally.  Labs   Recent Labs  10/20/14 1349 10/20/14 1746 10/20/14 2330  TROPONINI 0.06* 0.26* 0.49*   Lab Results  Component Value Date   WBC 7.3 10/21/2014   HGB 14.2 10/21/2014   HCT 42.3 10/21/2014   MCV 98.3 10/21/2014   PLT 128* 10/21/2014    Recent Labs Lab 10/21/14 0433  NA 142  K 3.9  CL 108  CO2 26  BUN 16  CREATININE 0.87  CALCIUM 8.7*  GLUCOSE 89   Lab Results  Component Value Date   CHOL 143 10/21/2014   HDL 56 10/21/2014   LDLCALC 69 10/21/2014   TRIG 88 10/21/2014    Radiology/Studies  Dg Chest 1 View  10/20/2014   CLINICAL DATA:  Chest pain and shortness of breath beginning today.  EXAM: CHEST  1 VIEW  COMPARISON:  Single view of the chest 08/12/2012.  FINDINGS: Heart size is upper normal. Minimal subsegmental atelectasis is seen in the left lung base. The lungs are otherwise clear. No pneumothorax or pleural effusion. Thoracolumbar scoliosis noted.  IMPRESSION: No acute disease.   Electronically Signed   By: Inge Rise M.D.   On: 10/20/2014 16:04   ECG  Regular sinus rhythm, 97, T-wave inversion in leads 3 and aVF with delayed R-wave progression.  ASSESSMENT AND PLAN  1. Non-ST segment elevation myocardial infarction: 74 year old female without prior history of CAD who presented to the emergency department on May 25 with a 4 hour history of substernal chest pressure that resolved spontaneously. She has been noted to have inferior T-wave inversion and troponin elevations with a rise to 0.49 late last night. She has been pain-free since admission and remains on heparin therapy along with aspirin, statin, beta blocker, and ACE inhibitor. We have held her  breakfast this morning and will plan for diagnostic cardiac catheterization this afternoon. The patient understands that risks include but are not limited to stroke (1 in 1000), death (1 in 14), kidney failure [usually temporary] (1 in 500), bleeding (1 in 200), allergic reaction [possibly serious] (1 in 200), and agrees to proceed.  We have also communicated this with her son.  2. Essential hypertension: Blood pressure has been elevated since admission. I will change her metoprolol to carvedilol for better blood pressure management. Continue ACE inhibitor at current dose.  3. Hyperlipidemia: She had previously been on some statin 49 g at home and this has been switched to atorvastatin. In the setting of acute coronary syndrome, I will increase this to 80 mg daily. LDL is currently 69. Follow-up liver function tests in the morning.  4. Stage II chronic kidney disease: Hydrate precath and follow-up creatinine in the morning. She is chronically treated with ACE inhibitor therapy.  5. Tobacco abuse: Complete cessation advised.  Signed, Murray Hodgkins, NP 10/21/2014, 9:20 AM

## 2014-10-21 NOTE — Discharge Summary (Signed)
[facility] NAME: Sacoya Mcgourty    MR#:  [id number]  DATE OF BIRTH:  [birth year]  DATE OF ADMISSION:  10/20/2014 ADMITTING PHYSICIAN: Demetrios Loll, MD  DATE OF DISCHARGE: 10/21/2014  PRIMARY CARE PHYSICIAN: pt. Needs to get herself a PCP. Dr. Fletcher Anon.     ADMISSION DIAGNOSIS:  NSTEMI (non-ST elevated myocardial infarction) [I21.4]  DISCHARGE DIAGNOSIS:  Active Problems:   NSTEMI (non-ST elevated myocardial infarction)   Hyperlipidemia   Essential hypertension   Tobacco abuse   CKD (chronic kidney disease), stage II   GERD (gastroesophageal reflux disease)   SECONDARY DIAGNOSIS:   Past Medical History  Diagnosis Date  . Essential hypertension   . Hyperlipidemia   . GERD (gastroesophageal reflux disease)   . History of mania   . Anemia   . CKD (chronic kidney disease), stage II   . Chronic back pain   . History of sciatica   . H/O Vertebral Fractures   . Peripheral neuropathy   . Tobacco abuse     HOSPITAL COURSE:  74 year old female with past medical history of hypertension, GERD hyperlipidemia, ongoing tobacco abuse, chronic kidney disease stage II with presented to the hospital with chest pain.  1. NSTEMI - presented with chest pain and had ruled in with cardiac markers trending upwards.  - Patient did have some T-wave inversions in lateral leads and was seen by cardiology and underwent a cardiac catheterization which showed minimal coronary disease and the patient did not require any acute intervention. Patient is clinically chest pain free and hemodynamically stable and therefore being discharged home. The likely cause of patient's elevated troponin was probably accelerated hypertension - Patient will continue aspirin and beta blocker ACE inhibitor and statin  2. Hypertension - patient's blood pressure was a bit accelerated - She is being discharged on oral beta blockers with Coreg and continuation of her  lisinopril.  3. Hyperlipidemia - continue atorvastatin  4. GERD - Protonix  5. Tobacco abuse -  While in Hospital patient was maintained on nicotine patch and is strongly being advised to quit smoking.    Cardiac cath results   Mid RCA lesion, 20% stenosed.  LM lesion, 20% stenosed.  Mid LAD lesion, 40% stenosed.  Dist LAD lesion, 30% stenosed.  1. Mild to moderate calcified nonobstructive coronary artery disease. 2. Normal LV systolic function. Mildly elevated left ventricular end-diastolic pressure.  Recommendations: Recommend medical therapy. The patient can be discharged home from a cardiac standpoint.    DISCHARGE CONDITIONS:   Stable  CONSULTS OBTAINED:  Treatment Team:  Wellington Hampshire, MD  DRUG ALLERGIES:  No Known Allergies  DISCHARGE MEDICATIONS:   Current Discharge Medication List    START taking these medications   Details  aspirin 81 MG EC tablet Take 1 tablet (81 mg total) by mouth daily. Qty: 30 tablet, Refills: 1    carvedilol (COREG) 6.25 MG tablet Take 1 tablet (6.25 mg total) by mouth 2 (two) times daily with a meal. Qty: 60 tablet, Refills: 1      CONTINUE these medications which have NOT CHANGED   Details  acetaminophen (TYLENOL) 500 MG tablet Take 500 mg by mouth every 6 (six) hours as needed for mild pain or headache.     Cholecalciferol (VITAMIN D-3 PO) Take 1 tablet by mouth daily.    lisinopril (PRINIVIL,ZESTRIL) 20 MG tablet Take 20 mg by mouth daily.    omeprazole (PRILOSEC) 20 MG capsule Take 20 mg  by mouth daily.    simvastatin (ZOCOR) 40 MG tablet Take 40 mg by mouth daily.    vitamin B-12 (CYANOCOBALAMIN) 1000 MCG tablet Take 1,000 mcg by mouth daily.    risperiDONE (RISPERDAL) 0.5 MG tablet Take 0.5 mg by mouth 2 (two) times daily.         DISCHARGE INSTRUCTIONS:   DIET:  Cardiac diet  DISCHARGE CONDITION:  Stable  ACTIVITY:  Activity as tolerated  OXYGEN:  Home Oxygen: No.   Oxygen Delivery: room  air  DISCHARGE LOCATION:  home   If you experience worsening of your admission symptoms, develop shortness of breath, life threatening emergency, suicidal or homicidal thoughts you must seek medical attention immediately by calling 911 or calling your MD immediately  if symptoms less severe.  You Must read complete instructions/literature along with all the possible adverse reactions/side effects for all the Medicines you take and that have been prescribed to you. Take any new Medicines after you have completely understood and accpet all the possible adverse reactions/side effects.   Please note  You were cared for by a hospitalist during your hospital stay. If you have any questions about your discharge medications or the care you received while you were in the hospital after you are discharged, you can call the unit and asked to speak with the hospitalist on call if the hospitalist that took care of you is not available. Once you are discharged, your primary care physician will handle any further medical issues. Please note that NO REFILLS for any discharge medications will be authorized once you are discharged, as it is imperative that you return to your primary care physician (or establish a relationship with a primary care physician if you do not have one) for your aftercare needs so that they can reassess your need for medications and monitor your lab values.     Today    VITAL SIGNS:  Blood pressure 176/89, pulse 58, temperature 99 F (37.2 C), temperature source Oral, resp. rate 14, height 5\' 3"  (1.6 m), weight 51.982 kg (114 lb 9.6 oz), SpO2 94 %.  I/O:   Intake/Output Summary (Last 24 hours) at 10/21/14 1503 Last data filed at 10/21/14 1100  Gross per 24 hour  Intake    243 ml  Output    200 ml  Net     43 ml    PHYSICAL EXAMINATION:  GENERAL:  74 y.o.-year-old patient lying in the bed with no acute distress.  EYES: Pupils equal, round, reactive to light and  accommodation. No scleral icterus. Extraocular muscles intact.  HEENT: Head atraumatic, normocephalic. Oropharynx and nasopharynx clear.  NECK:  Supple, no jugular venous distention. No thyroid enlargement, no tenderness.  LUNGS: Normal breath sounds bilaterally, no wheezing, rales,rhonchi or crepitation. No use of accessory muscles of respiration.  CARDIOVASCULAR: S1, S2 normal. No murmurs, rubs, or gallops.  ABDOMEN: Soft, non-tender, non-distended. Bowel sounds present. No organomegaly or mass.  EXTREMITIES: No pedal edema, cyanosis, or clubbing.  NEUROLOGIC: Cranial nerves II through XII are intact. No focal motor or sensory defecits b/l.  PSYCHIATRIC: The patient is alert and oriented x 3.  SKIN: No obvious rash, lesion, or ulcer.   DATA REVIEW:   CBC  Recent Labs Lab 10/21/14 0433  WBC 7.3  HGB 14.2  HCT 42.3  PLT 128*    Chemistries   Recent Labs Lab 10/21/14 0433  NA 142  K 3.9  CL 108  CO2 26  GLUCOSE 89  BUN 16  CREATININE 0.87  CALCIUM 8.7*    Cardiac Enzymes  Recent Labs Lab 10/20/14 2330  TROPONINI 0.49*    Microbiology Results  Results for orders placed or performed in visit on 08/12/12  Culture, blood (single)     Status: None   Collection Time: 08/12/12  7:42 PM  Result Value Ref Range Status   Micro Text Report   Final       COMMENT                   NO GROWTH AEROBICALLY/ANAEROBICALLY IN 5 DAYS   ANTIBIOTIC                                                      Culture, blood (single)     Status: None   Collection Time: 08/12/12  7:55 PM  Result Value Ref Range Status   Micro Text Report   Final       COMMENT                   NO GROWTH AEROBICALLY/ANAEROBICALLY IN 5 DAYS   ANTIBIOTIC                                                      Urine culture     Status: None   Collection Time: 08/16/12  8:17 AM  Result Value Ref Range Status   Micro Text Report   Final       SOURCE: CC    COMMENT                   NO GROWTH IN 18-24  HOURS   ANTIBIOTIC                                                        RADIOLOGY:  Dg Chest 1 View  10/20/2014   CLINICAL DATA:  Chest pain and shortness of breath beginning today.  EXAM: CHEST  1 VIEW  COMPARISON:  Single view of the chest 08/12/2012.  FINDINGS: Heart size is upper normal. Minimal subsegmental atelectasis is seen in the left lung base. The lungs are otherwise clear. No pneumothorax or pleural effusion. Thoracolumbar scoliosis noted.  IMPRESSION: No acute disease.   Electronically Signed   By: Inge Rise M.D.   On: 10/20/2014 16:04      Management plans discussed with the patient, family and they are in agreement.  CODE STATUS:     Code Status Orders        Start     Ordered   10/21/14 1320  Full code   Continuous     10/21/14 1320      TOTAL TIME TAKING CARE OF THIS PATIENT: 35 minutes.    Henreitta Leber M.D on 10/21/2014 at 3:03 PM  Between 7am to 6pm - Pager - [phone]  After 6pm go to [email or web address] - password [email or web address] [facility]  [facility]  Office  [phone]  CC: Primary care physician; No primary care provider  on file.

## 2014-10-22 ENCOUNTER — Encounter: Payer: Self-pay | Admitting: Cardiovascular Disease

## 2014-10-22 ENCOUNTER — Telehealth: Payer: Self-pay

## 2014-10-22 NOTE — Telephone Encounter (Signed)
Patient contacted regarding discharge from [facility] on 10/21/14.  Patient understands to follow up with Dr. Fletcher Anon on 11/15/14 at 2:45 at [facility]. Patient understands discharge instructions? yes Patient understands medications and regiment? yes Patient understands to bring all medications to this visit? yes

## 2014-10-22 NOTE — Telephone Encounter (Signed)
-----   Message from Arie Sabina sent at 10/21/2014  4:26 PM EDT ----- Regarding: tcm/ph Dr. Fletcher Anon 11/15/14 @ 2:45

## 2014-11-04 DIAGNOSIS — I214 Non-ST elevation (NSTEMI) myocardial infarction: Secondary | ICD-10-CM | POA: Diagnosis not present

## 2014-11-04 DIAGNOSIS — E782 Mixed hyperlipidemia: Secondary | ICD-10-CM | POA: Diagnosis not present

## 2014-11-04 DIAGNOSIS — I10 Essential (primary) hypertension: Secondary | ICD-10-CM | POA: Diagnosis not present

## 2014-11-04 DIAGNOSIS — I2510 Atherosclerotic heart disease of native coronary artery without angina pectoris: Secondary | ICD-10-CM | POA: Diagnosis not present

## 2014-11-09 ENCOUNTER — Telehealth: Payer: Self-pay | Admitting: Family Medicine

## 2014-11-09 NOTE — Telephone Encounter (Signed)
Spoke to Cheryl Hall who states the pt needs a [employer] Referral. Pt will be seeing Dr.Arida on Monday June 20 at 2:44pm

## 2014-11-09 NOTE — Telephone Encounter (Signed)
Patient was not sure what I was talking about when I attempted to explain that she is not our patient and I can not enter [place]. She said she does not know anyone who would have called me and has a PCP already. I will reach out to Dr. Fletcher Anon and let them know.[facility]

## 2014-11-15 ENCOUNTER — Encounter: Payer: Self-pay | Admitting: Cardiovascular Disease

## 2014-11-15 ENCOUNTER — Ambulatory Visit (INDEPENDENT_AMBULATORY_CARE_PROVIDER_SITE_OTHER): Payer: Commercial Managed Care - HMO | Admitting: Cardiovascular Disease

## 2014-11-15 VITALS — BP 140/88 | HR 58 | Ht 63.0 in | Wt 113.8 lb

## 2014-11-15 DIAGNOSIS — I10 Essential (primary) hypertension: Secondary | ICD-10-CM

## 2014-11-15 DIAGNOSIS — E785 Hyperlipidemia, unspecified: Secondary | ICD-10-CM | POA: Diagnosis not present

## 2014-11-15 DIAGNOSIS — Z720 Tobacco use: Secondary | ICD-10-CM

## 2014-11-15 DIAGNOSIS — I2510 Atherosclerotic heart disease of native coronary artery without angina pectoris: Secondary | ICD-10-CM | POA: Diagnosis not present

## 2014-11-15 DIAGNOSIS — I214 Non-ST elevation (NSTEMI) myocardial infarction: Secondary | ICD-10-CM | POA: Diagnosis not present

## 2014-11-15 MED ORDER — CARVEDILOL 6.25 MG PO TABS: 6.2500 mg | ORAL_TABLET | Freq: Two times a day (BID) | ORAL | Status: DC

## 2014-11-15 MED ORDER — LISINOPRIL 40 MG PO TABS: 40.0000 mg | ORAL_TABLET | Freq: Every day | ORAL | Status: DC

## 2014-11-15 NOTE — Assessment & Plan Note (Signed)
Recent cardiac catheterization showed mild to moderate nonobstructive two-vessel coronary artery disease for which I recommend continuing medical therapy. She has no convincing symptoms of angina since hospital discharge. Continue with blood pressure control.

## 2014-11-15 NOTE — Progress Notes (Signed)
HPI  74 year old female who is here today for a follow-up visit regarding coronary artery disease. She has known history of hypertension, GERD hyperlipidemia, ongoing tobacco use and chronic kidney disease stage II .  She presented last month to [facility] with chest pain and  elevated blood pressure.  She was found to have mildly elevated troponin with lateral T wave changes on her EKG. I proceeded with cardiac catheterization which showed mild to moderate two-vessel coronary artery disease involving the LAD and RCA. Ejection fraction was normal. Elevated troponin was felt to be due to high blood pressure. Carvedilol was added.  She has been doing reasonably well since hospital discharge with no recurrent episodes of chest pain. She saw her primary care provider recently and was noted to be hypertensive with a systolic blood pressure of 170 and a heart rate of 77 bpm. She was instructed to follow a low-sodium diet and she has been doing so since that visit. She was instructed to increase the dose of carvedilol to 12.5 mg twice daily but she has not started on the dose yet. She is noted to be mildly bradycardic today. She is trying to quit smoking with a nicotine patch.  No Known Allergies   Current Outpatient Prescriptions on File Prior to Visit  Medication Sig Dispense Refill  . acetaminophen (TYLENOL) 500 MG tablet Take 500 mg by mouth every 6 (six) hours as needed for mild pain or headache.     Marland Kitchen aspirin 81 MG EC tablet Take 1 tablet (81 mg total) by mouth daily. 30 tablet 1  . carvedilol (COREG) 6.25 MG tablet Take 1 tablet (6.25 mg total) by mouth 2 (two) times daily with a meal. 60 tablet 1  . Cholecalciferol (VITAMIN D-3 PO) Take 1 tablet by mouth daily.    Marland Kitchen lisinopril (PRINIVIL,ZESTRIL) 20 MG tablet Take 20 mg by mouth daily.    Marland Kitchen omeprazole (PRILOSEC) 20 MG capsule Take 20 mg by mouth daily.    . risperiDONE (RISPERDAL) 0.5 MG tablet Take 0.5 mg by mouth 2 (two) times daily.    .  simvastatin (ZOCOR) 40 MG tablet Take 40 mg by mouth daily.    . vitamin B-12 (CYANOCOBALAMIN) 1000 MCG tablet Take 1,000 mcg by mouth daily.     No current facility-administered medications on file prior to visit.     Past Medical History  Diagnosis Date  . Essential hypertension   . Hyperlipidemia   . GERD (gastroesophageal reflux disease)   . History of mania   . Anemia   . CKD (chronic kidney disease), stage II   . Chronic back pain   . History of sciatica   . H/O Vertebral Fractures   . Peripheral neuropathy   . Tobacco abuse      Past Surgical History  Procedure Laterality Date  . Cholecystectomy    . Cardiac catheterization N/A 10/21/2014    Procedure: Left Heart Cath and Coronary Angiography;  Surgeon: Wellington Hampshire, MD;  Location: [facility];  Service: Cardiovascular;  Laterality: N/A;     Family History  Problem Relation Age of Onset  . Other Mother     died @ 25.  . Cancer Father     died of pancreatic cancer  . CAD Brother     alive in late 71's w/ h/o stenting.     History   Social History  . Marital Status: Divorced    Spouse Name: N/A  . Number of Children: N/A  .  Years of Education: N/A   Occupational History  . Not on file.   Social History Main Topics  . Smoking status: Current Every Day Smoker -- 1.00 packs/day for 50 years    Types: Cigarettes  . Smokeless tobacco: Not on file  . Alcohol Use: No  . Drug Use: Not on file  . Sexual Activity: Not on file   Other Topics Concern  . Not on file   Social History Narrative   Lives in [place] by herself.  Does not routinely exercise.  Son nearby.      PHYSICAL EXAM   BP 140/88 mmHg  Ht 5\' 3"  (1.6 m)  Wt 113 lb 12 oz (51.597 kg)  BMI 20.16 kg/m2 Constitutional: She is oriented to person, place, and time. She appears well-developed and well-nourished. No distress.  HENT: No nasal discharge.  Head: Normocephalic and atraumatic.  Eyes: Pupils are equal and round. No  discharge.  Neck: Normal range of motion. Neck supple. No JVD present. No thyromegaly present.  Cardiovascular: Normal rate, regular rhythm, normal heart sounds. Exam reveals no gallop and no friction rub. No murmur heard.  Pulmonary/Chest: Effort normal and breath sounds normal. No stridor. No respiratory distress. She has no wheezes. She has no rales. She exhibits no tenderness.  Abdominal: Soft. Bowel sounds are normal. She exhibits no distension. There is no tenderness. There is no rebound and no guarding.  Musculoskeletal: Normal range of motion. She exhibits no edema and no tenderness.  Neurological: She is alert and oriented to person, place, and time. Coordination normal.  Skin: Skin is warm and dry. No rash noted. She is not diaphoretic. No erythema. No pallor.  Psychiatric: She has a normal mood and affect. Her behavior is normal. Judgment and thought content normal.  Right radial pulse is normal with no hematoma.   GSU:PJSRP  Bradycardia  WITHIN NORMAL LIMITS   ASSESSMENT AND PLAN

## 2014-11-15 NOTE — Patient Instructions (Signed)
Medication Instructions:  Your physician has recommended you make the following change in your medication:  CONTINUE coreg 6.25mg  twice per day STOP taking lisinopril 20mg  once per day START taking lisinopril 40mg  once per day   Labwork: none  Testing/Procedures: none  Follow-Up: Your physician recommends that you schedule a follow-up appointment in: three months with Dr. Fletcher Anon.    Any Other Special Instructions Will Be Listed Below (If Applicable).

## 2014-11-15 NOTE — Assessment & Plan Note (Signed)
Continue treatment with simvastatin with a target LDL of less than 70. 

## 2014-11-15 NOTE — Assessment & Plan Note (Signed)
Blood pressure is still elevated but seems to be improving with low-sodium diet and taking her medications. Given that she is noted to be mildly bradycardic today, I elected not to increase the dose of carvedilol and instead increase lisinopril to 40 mg once daily.

## 2014-11-15 NOTE — Assessment & Plan Note (Signed)
She is determined to quit smoking and currently is using a nicotine patch.

## 2014-11-25 ENCOUNTER — Encounter: Payer: Self-pay | Admitting: Emergency Medicine

## 2014-11-25 ENCOUNTER — Emergency Department
Admission: EM | Admit: 2014-11-25 | Discharge: 2014-11-25 | Disposition: A | Discharge status: Home / Self Care | Payer: Commercial Managed Care - HMO | Attending: Emergency Medicine | Admitting: Emergency Medicine

## 2014-11-25 ENCOUNTER — Other Ambulatory Visit: Payer: Self-pay

## 2014-11-25 ENCOUNTER — Emergency Department: Payer: Commercial Managed Care - HMO

## 2014-11-25 DIAGNOSIS — Z720 Tobacco use: Secondary | ICD-10-CM | POA: Insufficient documentation

## 2014-11-25 DIAGNOSIS — Z7982 Long term (current) use of aspirin: Secondary | ICD-10-CM | POA: Insufficient documentation

## 2014-11-25 DIAGNOSIS — R079 Chest pain, unspecified: Secondary | ICD-10-CM | POA: Insufficient documentation

## 2014-11-25 DIAGNOSIS — R7309 Other abnormal glucose: Secondary | ICD-10-CM | POA: Diagnosis not present

## 2014-11-25 DIAGNOSIS — I129 Hypertensive chronic kidney disease with stage 1 through stage 4 chronic kidney disease, or unspecified chronic kidney disease: Secondary | ICD-10-CM | POA: Diagnosis not present

## 2014-11-25 DIAGNOSIS — I10 Essential (primary) hypertension: Secondary | ICD-10-CM | POA: Diagnosis not present

## 2014-11-25 DIAGNOSIS — E782 Mixed hyperlipidemia: Secondary | ICD-10-CM | POA: Diagnosis not present

## 2014-11-25 DIAGNOSIS — Z79899 Other long term (current) drug therapy: Secondary | ICD-10-CM | POA: Diagnosis not present

## 2014-11-25 DIAGNOSIS — I2510 Atherosclerotic heart disease of native coronary artery without angina pectoris: Secondary | ICD-10-CM | POA: Diagnosis not present

## 2014-11-25 DIAGNOSIS — N182 Chronic kidney disease, stage 2 (mild): Secondary | ICD-10-CM | POA: Insufficient documentation

## 2014-11-25 DIAGNOSIS — Z0000 Encounter for general adult medical examination without abnormal findings: Secondary | ICD-10-CM | POA: Diagnosis not present

## 2014-11-25 DIAGNOSIS — N183 Chronic kidney disease, stage 3 (moderate): Secondary | ICD-10-CM | POA: Diagnosis not present

## 2014-11-25 HISTORY — DX: Atherosclerotic heart disease of native coronary artery without angina pectoris: I25.10

## 2014-11-25 LAB — CBC
HCT: 44.7 % (ref 35.0–47.0)
HEMOGLOBIN: 14.8 g/dL (ref 12.0–16.0)
MCH: 32.3 pg (ref 26.0–34.0)
MCHC: 33.1 g/dL (ref 32.0–36.0)
MCV: 97.6 fL (ref 80.0–100.0)
Platelets: 148 10*3/uL — ABNORMAL LOW (ref 150–440)
RBC: 4.58 MIL/uL (ref 3.80–5.20)
RDW: 13.5 % (ref 11.5–14.5)
WBC: 8 10*3/uL (ref 3.6–11.0)

## 2014-11-25 LAB — BASIC METABOLIC PANEL
Anion gap: 9 (ref 5–15)
BUN: 17 mg/dL (ref 6–20)
CALCIUM: 9.4 mg/dL (ref 8.9–10.3)
CO2: 26 mmol/L (ref 22–32)
CREATININE: 1.03 mg/dL — AB (ref 0.44–1.00)
Chloride: 105 mmol/L (ref 101–111)
GFR calc non Af Amer: 53 mL/min — ABNORMAL LOW (ref >60)
GLUCOSE: 133 mg/dL — AB (ref 65–99)
POTASSIUM: 4.5 mmol/L (ref 3.5–5.1)
SODIUM: 140 mmol/L (ref 135–145)

## 2014-11-25 LAB — TROPONIN I
Troponin I: <0.03 ng/mL (ref <0.031)
Troponin I: <0.03 ng/mL (ref <0.031)

## 2014-11-25 MED ORDER — ASPIRIN 81 MG PO CHEW
243.0000 mg | CHEWABLE_TABLET | Freq: Once | ORAL | Status: AC
  Administered 2014-11-25: 243 mg via ORAL

## 2014-11-25 MED ORDER — ASPIRIN 81 MG PO CHEW
CHEWABLE_TABLET | ORAL | Status: AC
  Filled 2014-11-25: qty 3

## 2014-11-25 NOTE — ED Provider Notes (Signed)
[facility] Emergency Department Provider Note  ____________________________________________  Time seen: Approximately 5:17 PM  I have reviewed the triage vital signs and the nursing notes.   HISTORY  Chief Complaint Chest Pain    HPI Cheryl Hall is a 74 y.o. female with a history of moderate two-vessel coronary artery disease diagnosed on catheterization about one month ago and a history of hypertension who presents with an episode of left-sided, moderate in severity, aching chest pain that started acutely.  It lasted a couple of hours, improved with one nitroglycerin, and has completely resolved since coming to the emergency department.  She had no shortness of breath, nausea, vomiting, abdominal pain, or back pain.  The pain did not radiate to her arm or her neck.  She complains of some left shoulder pain but states that this is chronic from arthritis.  She has not had any lightheadedness or dizziness.  Dr. Fletcher Anon is her cardiologist.   Past Medical History  Diagnosis Date  . Essential hypertension   . Hyperlipidemia   . GERD (gastroesophageal reflux disease)   . History of mania   . Anemia   . CKD (chronic kidney disease), stage II   . Chronic back pain   . History of sciatica   . H/O Vertebral Fractures   . Peripheral neuropathy   . Tobacco abuse   . Coronary artery disease     moderate 2-vessel disease    Patient Active Problem List   Diagnosis Date Noted  . Coronary artery disease involving native coronary artery without angina pectoris 11/15/2014  . Hyperlipidemia   . Essential hypertension   . Tobacco abuse   . CKD (chronic kidney disease), stage II   . GERD (gastroesophageal reflux disease)   . NSTEMI (non-ST elevated myocardial infarction) 10/20/2014    Past Surgical History  Procedure Laterality Date  . Cholecystectomy    . Cardiac catheterization N/A 10/21/2014    Procedure: Left Heart Cath and Coronary Angiography;  Surgeon:  Wellington Hampshire, MD;  Location: [facility];  Service: Cardiovascular;  Laterality: N/A;  . Cardiac catheterization  10/21/2014    Dr. Fletcher Anon    Current Outpatient Rx  Name  Route  Sig  Dispense  Refill  . acetaminophen (TYLENOL) 500 MG tablet   Oral   Take 500 mg by mouth every 6 (six) hours as needed for mild pain or headache.          Marland Kitchen aspirin 81 MG EC tablet   Oral   Take 1 tablet (81 mg total) by mouth daily.   30 tablet   1   . carvedilol (COREG) 6.25 MG tablet   Oral   Take 1 tablet (6.25 mg total) by mouth 2 (two) times daily with a meal.   60 tablet   6   . Cholecalciferol (VITAMIN D-3 PO)   Oral   Take 1 tablet by mouth daily.         Marland Kitchen lisinopril (PRINIVIL,ZESTRIL) 40 MG tablet   Oral   Take 1 tablet (40 mg total) by mouth daily.   30 tablet   6   . nicotine (NICODERM CQ - DOSED IN MG/24 HOURS) 14 mg/24hr patch   Transdermal   Place 14 mg onto the skin daily.         . nitroGLYCERIN (NITROSTAT) 0.4 MG SL tablet   Sublingual   Place 0.4 mg under the tongue every 5 (five) minutes as needed for chest pain.         Marland Kitchen  omeprazole (PRILOSEC) 20 MG capsule   Oral   Take 20 mg by mouth daily.         . risperiDONE (RISPERDAL) 0.5 MG tablet   Oral   Take 0.5 mg by mouth 2 (two) times daily.         . simvastatin (ZOCOR) 40 MG tablet   Oral   Take 40 mg by mouth daily.         . vitamin B-12 (CYANOCOBALAMIN) 1000 MCG tablet   Oral   Take 1,000 mcg by mouth daily.           Allergies Review of patient's allergies indicates no known allergies.  Family History  Problem Relation Age of Onset  . Other Mother     died @ 42.  . Cancer Father     died of pancreatic cancer  . CAD Brother     alive in late 35's w/ h/o stenting.    Social History History  Substance Use Topics  . Smoking status: Current Every Day Smoker -- 1.00 packs/day for 50 years    Types: Cigarettes  . Smokeless tobacco: Not on file     Comment: Smoking 2  cigarettes every other day.   . Alcohol Use: No    Review of Systems Constitutional: No fever/chills Eyes: No visual changes. ENT: No sore throat. Cardiovascular: Chest pain as described above. Respiratory: Denies shortness of breath. Gastrointestinal: No abdominal pain.  No nausea, no vomiting.  No diarrhea.  No constipation. Genitourinary: Negative for dysuria. Musculoskeletal: Negative for back pain. Skin: Negative for rash. Neurological: Negative for headaches, focal weakness or numbness.  10-point ROS otherwise negative.  ____________________________________________   PHYSICAL EXAM:  VITAL SIGNS: ED Triage Vitals  Enc Vitals Group     BP 11/25/14 1451 117/91 mmHg     Pulse Rate 11/25/14 1451 72     Resp 11/25/14 1451 17     Temp 11/25/14 1451 98.4 F (36.9 C)     Temp Source 11/25/14 1451 Oral     SpO2 11/25/14 1451 95 %     Weight 11/25/14 1451 113 lb (51.256 kg)     Height 11/25/14 1451 5\' 2"  (1.575 m)     Head Cir --      Peak Flow --      Pain Score 11/25/14 1449 7     Pain Loc --      Pain Edu? --      Excl. in [place]? --     Constitutional: Alert and oriented. Well appearing and in no acute distress. Eyes: Conjunctivae are normal. PERRL. EOMI. Head: Atraumatic. Nose: No congestion/rhinnorhea. Mouth/Throat: Mucous membranes are moist.  Oropharynx non-erythematous. Neck: No stridor.   Cardiovascular: Normal rate, regular rhythm. Grossly normal heart sounds.  Good peripheral circulation.  Minimally reproducible tenderness to palpation of the left anterior chest Respiratory: Normal respiratory effort.  No retractions. Lungs CTAB. Gastrointestinal: Soft and nontender. No distention. No abdominal bruits. No CVA tenderness. Musculoskeletal: No lower extremity tenderness nor edema.  No joint effusions. Neurologic:  Normal speech and language. No gross focal neurologic deficits are appreciated. Speech is normal. Skin:  Skin is warm, dry and intact. No rash  noted. Psychiatric: Mood and affect are normal. Speech and behavior are normal.  ____________________________________________   LABS (all labs ordered are listed, but only abnormal results are displayed)  Labs Reviewed  BASIC METABOLIC PANEL - Abnormal; Notable for the following:    Glucose, Bld 133 (*)    Creatinine,  Ser 1.03 (*)    GFR calc non Af Amer 53 (*)    All other components within normal limits  CBC - Abnormal; Notable for the following:    Platelets 148 (*)    All other components within normal limits  TROPONIN I  TROPONIN I   ____________________________________________  EKG  ED ECG REPORT I, Leland Staszewski, the attending physician, personally viewed and interpreted this ECG.  Date: 11/25/2014 EKG Time: 14:45 Rate: 65 Rhythm: normal sinus rhythm QRS Axis: normal Intervals: normal ST/T Wave abnormalities: normal Conduction Disutrbances: none Narrative Interpretation: unremarkable  ____________________________________________  RADIOLOGY  I, Jisella Ashenfelter, personally viewed and evaluated these images as part of my medical decision making.   Dg Chest 2 View  11/25/2014   CLINICAL DATA:  74 year old female with a history of chest pain for 3 hr.  EXAM: CHEST - 2 VIEW  COMPARISON:  10/20/2014, 08/12/2012, CT chest 08/13/2012  FINDINGS: Cardiomediastinal silhouette unchanged, within normal limits. Calcifications of the aortic arch. Tortuosity of the descending thoracic aorta.  No pulmonary vascular congestion.  Stigmata of emphysema, with increased retrosternal airspace, flattened hemidiaphragms, increased AP diameter, and hyperinflation on the AP view.  No confluent airspace disease, pneumothorax, or pleural effusion. Similar appearance of chronic interstitial opacities.  Nodule of the right upper lobe measures approximately 10 mm, located in a region of a previous EKG lead.  Atelectasis at the bases.  Surgical changes in the upper abdomen.  No displaced fracture.  Compression fracture of mid thoracic level with approximately 50% anterior height loss. This is age indeterminate.  IMPRESSION: No radiographic evidence of acute cardiopulmonary disease.  Changes of emphysema.  Nodule of the right upper lobe measures 10 mm, and was located in a region of a prior EKG lead. Further evaluation with chest CT is recommended.  Age indeterminate compression fracture of the thoracic spine, which was not fully characterized on prior plain film.  Signed,  Dulcy Fanny. Earleen Newport, DO  [facility]  [facility]   Electronically Signed   By: Corrie Mckusick D.O.   On: 11/25/2014 15:22    ____________________________________________   PROCEDURES  Procedure(s) performed: None  Critical Care performed: No ____________________________________________   INITIAL IMPRESSION / ASSESSMENT AND PLAN / ED COURSE  Pertinent labs & imaging results that were available during my care of the patient were reviewed by me and considered in my medical decision making (see chart for details).  The patient had one persistent episode of left-sided chest pain without any accompanying symptoms.  She has known coronary artery disease and had a catheterization last month.  She is currently completely asymptomatic and wants to go home.  I advised her that she needs to stay for a second troponin and if she is still asymptomatic at that time that we will discharge her for close follow-up with Dr. Fletcher Anon.  I doubt that this chest pain is a result of pulmonary embolism or other emergent medical condition.  Stable angina is the most likely cause as she has had similar pain in the past.  She received 3 baby aspirin in the emergency department since she takes 1 baby aspirin at home.  I also informed her of the incidental pulmonary nodule found on the chest x-ray and advised that she should follow-up as an outpatient possibly for additional  imaging.  ----------------------------------------- 6:57 PM on 11/25/2014 -----------------------------------------  The patient's second troponin is negative and she has remained pain-free in the emergency department.  I discussed with her  and her son my recommendations and she wants to go home and will follow-up with Dr. Fletcher Anon.  ____________________________________________  FINAL CLINICAL IMPRESSION(S) / ED DIAGNOSES  Final diagnoses:  Chest pain, unspecified chest pain type      NEW MEDICATIONS STARTED DURING THIS VISIT:  New Prescriptions   No medications on file     Hinda Kehr, MD 11/25/14 1858

## 2014-11-25 NOTE — Discharge Instructions (Signed)
You have been seen in the Emergency Department (ED) today for chest pain.  As we have discussed todays test results are normal, but you may require further testing.  Please follow up with Dr. Fletcher Anon as instructed above in these documents regarding todays emergent visit and your recent symptoms to discuss further management.  Continue to take your regular medications. If you are not doing so already, please also take a daily baby aspirin (81 mg), at least until you follow up with your doctor.  As we discussed, your chest x-ray showed a pulmonary nodule that has not been seen in the past.  Although this is not the cause of your pain today, we recommend that you discuss it with your regular doctor or with Dr. Fletcher Anon to possibly schedule additional outpatient imaging such as a chest CT.  Return to the Emergency Department (ED) if you experience any further chest pain/pressure/tightness, difficulty breathing, or sudden sweating, or other symptoms that concern you.   Chest Pain (Nonspecific) It is often hard to give a specific diagnosis for the cause of chest pain. There is always a chance that your pain could be related to something serious, such as a heart attack or a blood clot in the lungs. You need to follow up with your health care provider for further evaluation. CAUSES   Heartburn.  Pneumonia or bronchitis.  Anxiety or stress.  Inflammation around your heart (pericarditis) or lung (pleuritis or pleurisy).  A blood clot in the lung.  A collapsed lung (pneumothorax). It can develop suddenly on its own (spontaneous pneumothorax) or from trauma to the chest.  Shingles infection (herpes zoster virus). The chest wall is composed of bones, muscles, and cartilage. Any of these can be the source of the pain.  The bones can be bruised by injury.  The muscles or cartilage can be strained by coughing or overwork.  The cartilage can be affected by inflammation and become sore  (costochondritis). DIAGNOSIS  Lab tests or other studies may be needed to find the cause of your pain. Your health care provider may have you take a test called an ambulatory electrocardiogram (ECG). An ECG records your heartbeat patterns over a 24-hour period. You may also have other tests, such as:  Transthoracic echocardiogram (TTE). During echocardiography, sound waves are used to evaluate how blood flows through your heart.  Transesophageal echocardiogram (TEE).  Cardiac monitoring. This allows your health care provider to monitor your heart rate and rhythm in real time.  Holter monitor. This is a portable device that records your heartbeat and can help diagnose heart arrhythmias. It allows your health care provider to track your heart activity for several days, if needed.  Stress tests by exercise or by giving medicine that makes the heart beat faster. TREATMENT   Treatment depends on what may be causing your chest pain. Treatment may include:  Acid blockers for heartburn.  Anti-inflammatory medicine.  Pain medicine for inflammatory conditions.  Antibiotics if an infection is present.  You may be advised to change lifestyle habits. This includes stopping smoking and avoiding alcohol, caffeine, and chocolate.  You may be advised to keep your head raised (elevated) when sleeping. This reduces the chance of acid going backward from your stomach into your esophagus. Most of the time, nonspecific chest pain will improve within 2-3 days with rest and mild pain medicine.  HOME CARE INSTRUCTIONS   If antibiotics were prescribed, take them as directed. Finish them even if you start to feel better.  For  the next few days, avoid physical activities that bring on chest pain. Continue physical activities as directed.  Do not use any tobacco products, including cigarettes, chewing tobacco, or electronic cigarettes.  Avoid drinking alcohol.  Only take medicine as directed by your health  care provider.  Follow your health care provider's suggestions for further testing if your chest pain does not go away.  Keep any follow-up appointments you made. If you do not go to an appointment, you could develop lasting (chronic) problems with pain. If there is any problem keeping an appointment, call to reschedule. SEEK MEDICAL CARE IF:   Your chest pain does not go away, even after treatment.  You have a rash with blisters on your chest.  You have a fever. SEEK IMMEDIATE MEDICAL CARE IF:   You have increased chest pain or pain that spreads to your arm, neck, jaw, back, or abdomen.  You have shortness of breath.  You have an increasing cough, or you cough up blood.  You have severe back or abdominal pain.  You feel nauseous or vomit.  You have severe weakness.  You faint.  You have chills. This is an emergency. Do not wait to see if the pain will go away. Get medical help at once. Call your local emergency services (911 in [place] Do not drive yourself to the hospital. MAKE SURE YOU:   Understand these instructions.  Will watch your condition.  Will get help right away if you are not doing well or get worse. Document Released: 02/21/2005 Document Revised: 05/19/2013 Document Reviewed: 12/18/2007 [facility] Patient Information 2015 [place], [place]. This information is not intended to replace advice given to you by your health care provider. Make sure you discuss any questions you have with your health care provider.

## 2014-11-25 NOTE — ED Notes (Signed)
Patient with no complaints at this time. Respirations even and unlabored. Skin warm/dry. Discharge instructions reviewed with patient at this time. Patient given opportunity to voice concerns/ask questions. Patient discharged at this time and left Emergency Department with steady gait.   

## 2014-11-25 NOTE — ED Notes (Addendum)
Patient c/o left sided chest aching that started today. Also experienced left shoulder pain last night. States that she was diagnosed with a heart attack 5 weeks ago and this feels similar. Patient took a nitro approx an hour ago; reports the pain eased off for a few minutes and then returned.

## 2014-12-02 ENCOUNTER — Ambulatory Visit (INDEPENDENT_AMBULATORY_CARE_PROVIDER_SITE_OTHER): Payer: Commercial Managed Care - HMO | Admitting: Cardiovascular Disease

## 2014-12-02 ENCOUNTER — Encounter: Payer: Self-pay | Admitting: Cardiovascular Disease

## 2014-12-02 VITALS — BP 122/86 | HR 71 | Ht 62.5 in | Wt 112.0 lb

## 2014-12-02 DIAGNOSIS — E785 Hyperlipidemia, unspecified: Secondary | ICD-10-CM

## 2014-12-02 DIAGNOSIS — I2510 Atherosclerotic heart disease of native coronary artery without angina pectoris: Secondary | ICD-10-CM

## 2014-12-02 DIAGNOSIS — R0789 Other chest pain: Secondary | ICD-10-CM

## 2014-12-02 DIAGNOSIS — I10 Essential (primary) hypertension: Secondary | ICD-10-CM | POA: Diagnosis not present

## 2014-12-02 NOTE — Patient Instructions (Signed)
Medication Instructions: Continue same medications.   Labwork: None.   Procedures/Testing: none  Follow-Up: Follow up in September as planned.   Any Additional Special Instructions Will Be Listed Below (If Applicable).

## 2014-12-02 NOTE — Assessment & Plan Note (Signed)
Recent back and chest pain is overall atypical and likely nonanginal. ECG does not show any acute changes. Recent cardiac catheterization showed no evidence of obstructive coronary artery. There seems to be an element of anxiety as well. Continue medical therapy for now.

## 2014-12-02 NOTE — Assessment & Plan Note (Signed)
Recent lipid profile showed a total cholesterol of 147, HDL of 47 and an LDL of 76. Continue treatment with simvastatin.

## 2014-12-02 NOTE — Assessment & Plan Note (Signed)
Blood pressure is now well controlled on current medications. 

## 2014-12-02 NOTE — Progress Notes (Signed)
HPI  74 year old female who is here today for a follow-up visit regarding coronary artery disease. She has known history of hypertension, GERD hyperlipidemia, ongoing tobacco use and chronic kidney disease stage II .  She presented to [facility] in May with chest pain and  elevated blood pressure.  She was found to have mildly elevated troponin with lateral T wave changes on her EKG. I proceeded with cardiac catheterization which showed mild to moderate two-vessel coronary artery disease involving the LAD and RCA. Ejection fraction was normal. Elevated troponin was felt to be due to high blood pressure. Carvedilol was added.  During last visit, I increased the dose of lisinopril to 40 mg once daily for elevated blood pressure. She has been trying to quit smoking with a nicotine patch. She suffers from scoliosis. She recently had an episode of back pain which radiated to the chest area. This lasted for about 15 minutes and was partially relieved with sublingual nitroglycerin. She went to the emergency room at [facility] where basic workup was negative. She reports no recurrent episodes since then.    No Known Allergies   Current Outpatient Prescriptions on File Prior to Visit  Medication Sig Dispense Refill  . acetaminophen (TYLENOL) 500 MG tablet Take 500 mg by mouth every 6 (six) hours as needed for mild pain or headache.     Marland Kitchen aspirin 81 MG EC tablet Take 1 tablet (81 mg total) by mouth daily. 30 tablet 1  . carvedilol (COREG) 6.25 MG tablet Take 1 tablet (6.25 mg total) by mouth 2 (two) times daily with a meal. 60 tablet 6  . Cholecalciferol (VITAMIN D-3 PO) Take 1 tablet by mouth daily.    . ferrous sulfate 325 (65 FE) MG tablet Take 325 mg by mouth daily.    Marland Kitchen lisinopril (PRINIVIL,ZESTRIL) 40 MG tablet Take 1 tablet (40 mg total) by mouth daily. 30 tablet 6  . nicotine (NICODERM CQ - DOSED IN MG/24 HOURS) 14 mg/24hr patch Place 14 mg onto the skin daily.    . nitroGLYCERIN (NITROSTAT) 0.4 MG SL  tablet Place 0.4 mg under the tongue every 5 (five) minutes as needed for chest pain.    Marland Kitchen omeprazole (PRILOSEC) 20 MG capsule Take 20 mg by mouth daily.    . risperiDONE (RISPERDAL) 0.5 MG tablet Take 0.5 mg by mouth 2 (two) times daily.    . simvastatin (ZOCOR) 40 MG tablet Take 40 mg by mouth daily.    . vitamin B-12 (CYANOCOBALAMIN) 1000 MCG tablet Take 1,000 mcg by mouth daily.     No current facility-administered medications on file prior to visit.     Past Medical History  Diagnosis Date  . Essential hypertension   . Hyperlipidemia   . GERD (gastroesophageal reflux disease)   . History of mania   . Anemia   . CKD (chronic kidney disease), stage II   . Chronic back pain   . History of sciatica   . H/O Vertebral Fractures   . Peripheral neuropathy   . Tobacco abuse   . Coronary artery disease     moderate 2-vessel disease     Past Surgical History  Procedure Laterality Date  . Cholecystectomy    . Cardiac catheterization N/A 10/21/2014    Procedure: Left Heart Cath and Coronary Angiography;  Surgeon: Wellington Hampshire, MD;  Location: [facility];  Service: Cardiovascular;  Laterality: N/A;  . Cardiac catheterization  10/21/2014    Dr. Fletcher Anon     Family  History  Problem Relation Age of Onset  . Other Mother     died @ 48.  . Cancer Father     died of pancreatic cancer  . CAD Brother     alive in late 7's w/ h/o stenting.     History   Social History  . Marital Status: Divorced    Spouse Name: N/A  . Number of Children: N/A  . Years of Education: N/A   Occupational History  . Not on file.   Social History Main Topics  . Smoking status: Current Some Day Smoker -- 0.25 packs/day for 50 years    Types: Cigarettes  . Smokeless tobacco: Not on file     Comment: Smoking 2 cigarettes every other day.   . Alcohol Use: No  . Drug Use: Not on file  . Sexual Activity: Not on file   Other Topics Concern  . Not on file   Social History Narrative    Lives in [place] by herself.  Does not routinely exercise.  Son nearby.      PHYSICAL EXAM   BP 122/86 mmHg  Pulse 71  Ht 5' 2.5" (1.588 m)  Wt 112 lb (50.803 kg)  BMI 20.15 kg/m2 Constitutional: She is oriented to person, place, and time. She appears well-developed and well-nourished. No distress.  HENT: No nasal discharge.  Head: Normocephalic and atraumatic.  Eyes: Pupils are equal and round. No discharge.  Neck: Normal range of motion. Neck supple. No JVD present. No thyromegaly present.  Cardiovascular: Normal rate, regular rhythm, normal heart sounds. Exam reveals no gallop and no friction rub. No murmur heard.  Pulmonary/Chest: Effort normal and breath sounds normal. No stridor. No respiratory distress. She has no wheezes. She has no rales. She exhibits no tenderness.  Abdominal: Soft. Bowel sounds are normal. She exhibits no distension. There is no tenderness. There is no rebound and no guarding.  Musculoskeletal: Normal range of motion. She exhibits no edema and no tenderness.  Neurological: She is alert and oriented to person, place, and time. Coordination normal.  Skin: Skin is warm and dry. No rash noted. She is not diaphoretic. No erythema. No pallor.  Psychiatric: She has a normal mood and affect. Her behavior is normal. Judgment and thought content normal.     [id number]  Rhythm  -With sinus arrhythmia WITHIN NORMAL LIMITS    ASSESSMENT AND PLAN

## 2015-01-17 DIAGNOSIS — R911 Solitary pulmonary nodule: Secondary | ICD-10-CM | POA: Diagnosis not present

## 2015-01-17 DIAGNOSIS — K5792 Diverticulitis of intestine, part unspecified, without perforation or abscess without bleeding: Secondary | ICD-10-CM | POA: Diagnosis not present

## 2015-01-17 DIAGNOSIS — R1032 Left lower quadrant pain: Secondary | ICD-10-CM | POA: Diagnosis not present

## 2015-01-21 ENCOUNTER — Other Ambulatory Visit: Payer: Self-pay | Admitting: Physician Assistant

## 2015-01-21 DIAGNOSIS — W57XXXA Bitten or stung by nonvenomous insect and other nonvenomous arthropods, initial encounter: Secondary | ICD-10-CM | POA: Diagnosis not present

## 2015-01-21 DIAGNOSIS — R911 Solitary pulmonary nodule: Secondary | ICD-10-CM

## 2015-01-21 DIAGNOSIS — R300 Dysuria: Secondary | ICD-10-CM | POA: Diagnosis not present

## 2015-01-27 ENCOUNTER — Ambulatory Visit
Admission: RE | Admit: 2015-01-27 | Discharge: 2015-01-27 | Disposition: A | Discharge status: Home / Self Care | Payer: Commercial Managed Care - HMO | Source: Ambulatory Visit | Attending: Physician Assistant | Admitting: Physician Assistant

## 2015-01-27 DIAGNOSIS — J984 Other disorders of lung: Secondary | ICD-10-CM | POA: Diagnosis not present

## 2015-01-27 DIAGNOSIS — R911 Solitary pulmonary nodule: Secondary | ICD-10-CM | POA: Diagnosis not present

## 2015-01-27 DIAGNOSIS — I2510 Atherosclerotic heart disease of native coronary artery without angina pectoris: Secondary | ICD-10-CM | POA: Insufficient documentation

## 2015-01-27 DIAGNOSIS — K760 Fatty (change of) liver, not elsewhere classified: Secondary | ICD-10-CM | POA: Insufficient documentation

## 2015-01-27 DIAGNOSIS — R079 Chest pain, unspecified: Secondary | ICD-10-CM | POA: Diagnosis not present

## 2015-01-27 MED ORDER — IOHEXOL 300 MG/ML  SOLN
75.0000 mL | Freq: Once | INTRAMUSCULAR | Status: AC | PRN
  Administered 2015-01-27: 75 mL via INTRAVENOUS

## 2015-02-18 ENCOUNTER — Ambulatory Visit: Payer: Commercial Managed Care - HMO | Admitting: Cardiovascular Disease

## 2015-02-22 DIAGNOSIS — R300 Dysuria: Secondary | ICD-10-CM | POA: Diagnosis not present

## 2015-02-24 ENCOUNTER — Ambulatory Visit (INDEPENDENT_AMBULATORY_CARE_PROVIDER_SITE_OTHER): Payer: Commercial Managed Care - HMO | Admitting: Cardiovascular Disease

## 2015-02-24 ENCOUNTER — Encounter: Payer: Self-pay | Admitting: Cardiovascular Disease

## 2015-02-24 VITALS — BP 160/90 | HR 61 | Ht 62.5 in | Wt 113.2 lb

## 2015-02-24 DIAGNOSIS — R0602 Shortness of breath: Secondary | ICD-10-CM

## 2015-02-24 DIAGNOSIS — E785 Hyperlipidemia, unspecified: Secondary | ICD-10-CM

## 2015-02-24 DIAGNOSIS — I10 Essential (primary) hypertension: Secondary | ICD-10-CM

## 2015-02-24 DIAGNOSIS — R079 Chest pain, unspecified: Secondary | ICD-10-CM | POA: Diagnosis not present

## 2015-02-24 DIAGNOSIS — I2510 Atherosclerotic heart disease of native coronary artery without angina pectoris: Secondary | ICD-10-CM

## 2015-02-24 DIAGNOSIS — Z720 Tobacco use: Secondary | ICD-10-CM

## 2015-02-24 NOTE — Assessment & Plan Note (Signed)
Lab Results  Component Value Date   CHOL 143 10/21/2014   HDL 56 10/21/2014   LDLCALC 69 10/21/2014   TRIG 88 10/21/2014   CHOLHDL 2.6 10/21/2014   Continue treatment with simvastatin. LDL was at target.

## 2015-02-24 NOTE — Assessment & Plan Note (Signed)
She is doing well overall with no symptoms suggestive of angina. I recommend continuing medical therapy.

## 2015-02-24 NOTE — Patient Instructions (Signed)
Medication Instructions: Continue same medications.   Labwork: None.   Procedures/Testing: None.   Follow-Up: 6 months with Dr. Barnard Sharps.   Any Additional Special Instructions Will Be Listed Below (If Applicable).   

## 2015-02-24 NOTE — Progress Notes (Signed)
HPI  74 year old female who is here today for a follow-up visit regarding nonobstructive coronary artery disease. She has known history of hypertension, GERD hyperlipidemia, ongoing tobacco use and chronic kidney disease stage II .  She presented to [facility] in May of this year with chest pain and  elevated blood pressure.  She was found to have mildly elevated troponin with lateral T wave changes on her EKG. I proceeded with cardiac catheterization which showed mild to moderate two-vessel coronary artery disease involving the LAD and RCA. Ejection fraction was normal. Elevated troponin was felt to be due to high blood pressure. Carvedilol was added.  Her blood pressure was controlled with carvedilol and lisinopril. She almost quit smoking completely with the help of a nicotine patch. She has been taking her medications regularly. Although blood pressure is elevated today, her recent blood pressure actually has been well-controlled. She was rushing to get to the appointment which might have elevated her blood pressure.  She suffers from scoliosis.   She reports rare episodes of sharp brief chest pain. Otherwise she has been doing very well.  No Known Allergies   Current Outpatient Prescriptions on File Prior to Visit  Medication Sig Dispense Refill  . acetaminophen (TYLENOL) 500 MG tablet Take 500 mg by mouth every 6 (six) hours as needed for mild pain or headache.     Marland Kitchen aspirin 81 MG EC tablet Take 1 tablet (81 mg total) by mouth daily. 30 tablet 1  . carvedilol (COREG) 6.25 MG tablet Take 1 tablet (6.25 mg total) by mouth 2 (two) times daily with a meal. 60 tablet 6  . Cholecalciferol (VITAMIN D-3 PO) Take 1 tablet by mouth daily.    . ferrous sulfate 325 (65 FE) MG tablet Take 325 mg by mouth daily.    Marland Kitchen lisinopril (PRINIVIL,ZESTRIL) 40 MG tablet Take 1 tablet (40 mg total) by mouth daily. 30 tablet 6  . nicotine (NICODERM CQ - DOSED IN MG/24 HOURS) 14 mg/24hr patch Place 14 mg onto the skin  daily.    . nitroGLYCERIN (NITROSTAT) 0.4 MG SL tablet Place 0.4 mg under the tongue every 5 (five) minutes as needed for chest pain.    Marland Kitchen omeprazole (PRILOSEC) 20 MG capsule Take 20 mg by mouth daily.    . risperiDONE (RISPERDAL) 0.5 MG tablet Take 0.5 mg by mouth 2 (two) times daily.    . simvastatin (ZOCOR) 40 MG tablet Take 40 mg by mouth daily.    . vitamin B-12 (CYANOCOBALAMIN) 1000 MCG tablet Take 1,000 mcg by mouth daily.     No current facility-administered medications on file prior to visit.     Past Medical History  Diagnosis Date  . Essential hypertension   . Hyperlipidemia   . GERD (gastroesophageal reflux disease)   . History of mania   . Anemia   . CKD (chronic kidney disease), stage II   . Chronic back pain   . History of sciatica   . H/O Vertebral Fractures   . Peripheral neuropathy   . Tobacco abuse   . Coronary artery disease     moderate 2-vessel disease     Past Surgical History  Procedure Laterality Date  . Cholecystectomy    . Cardiac catheterization N/A 10/21/2014    Procedure: Left Heart Cath and Coronary Angiography;  Surgeon: Wellington Hampshire, MD;  Location: [place] [facility];  Service: Cardiovascular;  Laterality: N/A;  . Cardiac catheterization  10/21/2014    Dr. Fletcher Anon  Family History  Problem Relation Age of Onset  . Other Mother     died @ 89.  . Cancer Father     died of pancreatic cancer  . CAD Brother     alive in late 77's w/ h/o stenting.     Social History   Social History  . Marital Status: Divorced    Spouse Name: N/A  . Number of Children: N/A  . Years of Education: N/A   Occupational History  . Not on file.   Social History Main Topics  . Smoking status: Current Some Day Smoker -- 0.25 packs/day for 50 years    Types: Cigarettes  . Smokeless tobacco: Not on file     Comment: Smoking 2 cigarettes every other day.   . Alcohol Use: No  . Drug Use: Not on file  . Sexual Activity: Not on file   Other Topics  Concern  . Not on file   Social History Narrative   Lives in [place] by herself.  Does not routinely exercise.  Son nearby.      PHYSICAL EXAM   BP 160/90 mmHg  Pulse 61  Ht 5' 2.5" (1.588 m)  Wt 113 lb 4 oz (51.37 kg)  BMI 20.37 kg/m2 Constitutional: She is oriented to person, place, and time. She appears well-developed and well-nourished. No distress.  HENT: No nasal discharge.  Head: Normocephalic and atraumatic.  Eyes: Pupils are equal and round. No discharge.  Neck: Normal range of motion. Neck supple. No JVD present. No thyromegaly present.  Cardiovascular: Normal rate, regular rhythm, normal heart sounds. Exam reveals no gallop and no friction rub. No murmur heard.  Pulmonary/Chest: Effort normal and breath sounds normal. No stridor. No respiratory distress. She has no wheezes. She has no rales. She exhibits no tenderness.  Abdominal: Soft. Bowel sounds are normal. She exhibits no distension. There is no tenderness. There is no rebound and no guarding.  Musculoskeletal: Normal range of motion. She exhibits no edema and no tenderness.  Neurological: She is alert and oriented to person, place, and time. Coordination normal.  Skin: Skin is warm and dry. No rash noted. She is not diaphoretic. No erythema. No pallor.  Psychiatric: She has a normal mood and affect. Her behavior is normal. Judgment and thought content normal.     CXK:GYJEHU sinus rhythm   ASSESSMENT AND PLAN

## 2015-02-24 NOTE — Assessment & Plan Note (Signed)
Blood pressure has been controlled lately but today's elevated. Continue to monitor. I made no changes.

## 2015-02-24 NOTE — Assessment & Plan Note (Signed)
I congratulated her on smoking cessation. 

## 2015-03-28 DIAGNOSIS — N183 Chronic kidney disease, stage 3 (moderate): Secondary | ICD-10-CM | POA: Diagnosis not present

## 2015-03-28 DIAGNOSIS — N390 Urinary tract infection, site not specified: Secondary | ICD-10-CM | POA: Diagnosis not present

## 2015-06-12 ENCOUNTER — Other Ambulatory Visit: Payer: Self-pay | Admitting: Cardiovascular Disease

## 2015-06-29 DIAGNOSIS — R5383 Other fatigue: Secondary | ICD-10-CM | POA: Diagnosis not present

## 2015-06-29 DIAGNOSIS — K219 Gastro-esophageal reflux disease without esophagitis: Secondary | ICD-10-CM | POA: Diagnosis not present

## 2015-06-29 DIAGNOSIS — E782 Mixed hyperlipidemia: Secondary | ICD-10-CM | POA: Diagnosis not present

## 2015-06-29 DIAGNOSIS — R1012 Left upper quadrant pain: Secondary | ICD-10-CM | POA: Diagnosis not present

## 2015-06-29 DIAGNOSIS — I10 Essential (primary) hypertension: Secondary | ICD-10-CM | POA: Diagnosis not present

## 2015-06-29 DIAGNOSIS — K5730 Diverticulosis of large intestine without perforation or abscess without bleeding: Secondary | ICD-10-CM | POA: Diagnosis not present

## 2015-07-03 ENCOUNTER — Emergency Department
Admission: EM | Admit: 2015-07-03 | Discharge: 2015-07-03 | Disposition: A | Discharge status: Home / Self Care | Payer: Commercial Managed Care - HMO | Attending: Emergency Medicine | Admitting: Emergency Medicine

## 2015-07-03 ENCOUNTER — Encounter: Payer: Self-pay | Admitting: Emergency Medicine

## 2015-07-03 ENCOUNTER — Emergency Department: Payer: Commercial Managed Care - HMO

## 2015-07-03 DIAGNOSIS — N182 Chronic kidney disease, stage 2 (mild): Secondary | ICD-10-CM | POA: Diagnosis not present

## 2015-07-03 DIAGNOSIS — Y9389 Activity, other specified: Secondary | ICD-10-CM | POA: Insufficient documentation

## 2015-07-03 DIAGNOSIS — R079 Chest pain, unspecified: Secondary | ICD-10-CM | POA: Diagnosis not present

## 2015-07-03 DIAGNOSIS — R0789 Other chest pain: Secondary | ICD-10-CM | POA: Insufficient documentation

## 2015-07-03 DIAGNOSIS — Y998 Other external cause status: Secondary | ICD-10-CM | POA: Diagnosis not present

## 2015-07-03 DIAGNOSIS — X58XXXA Exposure to other specified factors, initial encounter: Secondary | ICD-10-CM | POA: Diagnosis not present

## 2015-07-03 DIAGNOSIS — Z87891 Personal history of nicotine dependence: Secondary | ICD-10-CM | POA: Insufficient documentation

## 2015-07-03 DIAGNOSIS — Y9289 Other specified places as the place of occurrence of the external cause: Secondary | ICD-10-CM | POA: Insufficient documentation

## 2015-07-03 DIAGNOSIS — I129 Hypertensive chronic kidney disease with stage 1 through stage 4 chronic kidney disease, or unspecified chronic kidney disease: Secondary | ICD-10-CM | POA: Insufficient documentation

## 2015-07-03 DIAGNOSIS — Z7982 Long term (current) use of aspirin: Secondary | ICD-10-CM | POA: Diagnosis not present

## 2015-07-03 DIAGNOSIS — S299XXA Unspecified injury of thorax, initial encounter: Secondary | ICD-10-CM | POA: Diagnosis present

## 2015-07-03 DIAGNOSIS — Z79899 Other long term (current) drug therapy: Secondary | ICD-10-CM | POA: Insufficient documentation

## 2015-07-03 DIAGNOSIS — S29012A Strain of muscle and tendon of back wall of thorax, initial encounter: Secondary | ICD-10-CM | POA: Diagnosis not present

## 2015-07-03 LAB — BASIC METABOLIC PANEL
Anion gap: 6 (ref 5–15)
BUN: 20 mg/dL (ref 6–20)
CALCIUM: 8.7 mg/dL — AB (ref 8.9–10.3)
CO2: 23 mmol/L (ref 22–32)
Chloride: 110 mmol/L (ref 101–111)
Creatinine, Ser: 0.89 mg/dL (ref 0.44–1.00)
Glucose, Bld: 125 mg/dL — ABNORMAL HIGH (ref 65–99)
POTASSIUM: 4.5 mmol/L (ref 3.5–5.1)
Sodium: 139 mmol/L (ref 135–145)

## 2015-07-03 LAB — TROPONIN I

## 2015-07-03 LAB — CBC
HEMATOCRIT: 38.6 % (ref 35.0–47.0)
HEMOGLOBIN: 13 g/dL (ref 12.0–16.0)
MCH: 31.9 pg (ref 26.0–34.0)
MCHC: 33.7 g/dL (ref 32.0–36.0)
MCV: 94.6 fL (ref 80.0–100.0)
Platelets: 157 10*3/uL (ref 150–440)
RBC: 4.09 MIL/uL (ref 3.80–5.20)
RDW: 14 % (ref 11.5–14.5)
WBC: 6.7 10*3/uL (ref 3.6–11.0)

## 2015-07-03 MED ORDER — METHOCARBAMOL 500 MG PO TABS: 500.0000 mg | ORAL_TABLET | Freq: Four times a day (QID) | ORAL | Status: DC | PRN

## 2015-07-03 MED ORDER — CYCLOBENZAPRINE HCL 10 MG PO TABS: 5.0000 mg | ORAL_TABLET | Freq: Once | ORAL | Status: DC

## 2015-07-03 MED ORDER — METHOCARBAMOL 500 MG PO TABS
500.0000 mg | ORAL_TABLET | Freq: Once | ORAL | Status: AC
  Administered 2015-07-03: 500 mg via ORAL
  Filled 2015-07-03: qty 1

## 2015-07-03 NOTE — ED Notes (Signed)
Pt states that she has intermittent back pain that started mid back and goes down. Pt also states that when she has chest pain, she also has "an aching in her chest". Pt states cardiac hx. Pt also states that she has diverticulosis and scoliosis. Pt presents alert and oriented at this time. NAD noted. Pt able to speak in complete sentences at this time.

## 2015-07-03 NOTE — ED Provider Notes (Signed)
CSN: [id number]     Arrival date & time 07/03/15  1349 History   First MD Initiated Contact with Patient 07/03/15 1503     Chief Complaint  Patient presents with  . Back Pain  . Chest Pain     (Consider location/radiation/quality/duration/timing/severity/associated sxs/prior Treatment) The history is provided by the patient.  Cheryl Hall is a 75 y.o. female hx of HTN, HL, GERD, anemia, CKD, nonobstructive CAD here with back pain, chest pain. Patient has intermittent back pain and chest pain for the last 3 weeks. She states that she has scoliosis and sometimes it hurts from time to time but it got worse over the last 3 weeks. Pain usually radiate from back to her chest and is worse with movement. Denies shortness of breath. Patient saw primary care doctor 4 days ago and was thought to have pain from arthritis or muscular pain. She was not prescribe anything but pain has gotten worse. Does have history of nonobstructive CAD and does not have a cardiac stent. She had a positive troponin several years ago and had a cath that showed non obstructive disease. Patient is taking her meds as prescribed.     Past Medical History  Diagnosis Date  . Essential hypertension   . Hyperlipidemia   . GERD (gastroesophageal reflux disease)   . History of mania   . Anemia   . CKD (chronic kidney disease), stage II   . Chronic back pain   . History of sciatica   . H/O Vertebral Fractures   . Peripheral neuropathy ([place])   . Tobacco abuse   . Coronary artery disease     moderate 2-vessel disease   Past Surgical History  Procedure Laterality Date  . Cholecystectomy    . Cardiac catheterization N/A 10/21/2014    Procedure: Left Heart Cath and Coronary Angiography;  Surgeon: Wellington Hampshire, MD;  Location: [place] [facility];  Service: Cardiovascular;  Laterality: N/A;  . Cardiac catheterization  10/21/2014    Dr. Fletcher Anon   Family History  Problem Relation Age of Onset  . Other Mother     died @ 53.   . Cancer Father     died of pancreatic cancer  . CAD Brother     alive in late 72's w/ h/o stenting.   Social History  Substance Use Topics  . Smoking status: Former Smoker -- 0.25 packs/day for 50 years    Types: Cigarettes  . Smokeless tobacco: None     Comment: Smoking 2 cigarettes every other day.   . Alcohol Use: No   OB History    No data available     Review of Systems  Cardiovascular: Positive for chest pain.  Musculoskeletal: Positive for back pain.  All other systems reviewed and are negative.     Allergies  Review of patient's allergies indicates no known allergies.  Home Medications   Prior to Admission medications   Medication Sig Start Date End Date Taking? Authorizing Provider  acetaminophen (TYLENOL) 500 MG tablet Take 500 mg by mouth every 6 (six) hours as needed for mild pain or headache.     Historical Provider, MD  aspirin 81 MG EC tablet Take 1 tablet (81 mg total) by mouth daily. 10/21/14   Henreitta Leber, MD  carvedilol (COREG) 6.25 MG tablet Take 1 tablet (6.25 mg total) by mouth 2 (two) times daily with a meal. 11/15/14   Wellington Hampshire, MD  Cholecalciferol (VITAMIN D-3 PO) Take 1 tablet by  mouth daily.    Historical Provider, MD  ferrous sulfate 325 (65 FE) MG tablet Take 325 mg by mouth daily.    Historical Provider, MD  lisinopril (PRINIVIL,ZESTRIL) 40 MG tablet TAKE ONE TABLET BY MOUTH ONCE DAILY 06/13/15   Wellington Hampshire, MD  nicotine (NICODERM CQ - DOSED IN MG/24 HOURS) 14 mg/24hr patch Place 14 mg onto the skin daily.    Historical Provider, MD  nitroGLYCERIN (NITROSTAT) 0.4 MG SL tablet Place 0.4 mg under the tongue every 5 (five) minutes as needed for chest pain.    Historical Provider, MD  omeprazole (PRILOSEC) 20 MG capsule Take 20 mg by mouth daily.    Historical Provider, MD  risperiDONE (RISPERDAL) 0.5 MG tablet Take 0.5 mg by mouth 2 (two) times daily.    Historical Provider, MD  simvastatin (ZOCOR) 40 MG tablet Take 40 mg by mouth  daily.    Historical Provider, MD  vitamin B-12 (CYANOCOBALAMIN) 1000 MCG tablet Take 1,000 mcg by mouth daily.    Historical Provider, MD   BP 146/89 mmHg  Pulse 73  Temp(Src) 98.5 F (36.9 C) (Oral)  Resp 20  Ht 5\' 2"  (1.575 m)  Wt 123 lb (55.792 kg)  BMI 22.49 kg/m2  SpO2 94% Physical Exam  Constitutional: She is oriented to person, place, and time. She appears well-developed and well-nourished.  HENT:  Head: Normocephalic.  Mouth/Throat: Oropharynx is clear and moist.  Eyes: Conjunctivae are normal. Pupils are equal, round, and reactive to light.  Neck: Normal range of motion. Neck supple.  Cardiovascular: Normal rate, regular rhythm and normal heart sounds.   Pulmonary/Chest: Effort normal and breath sounds normal. No respiratory distress. She has no wheezes. She has no rales.  + scoliosis, + R parathoracic tenderness and muscle spasms. No midline tenderness. No chest tenderness   Abdominal: Soft. Bowel sounds are normal. She exhibits no distension. There is no tenderness. There is no rebound.  Musculoskeletal: Normal range of motion. She exhibits no edema.  Neurological: She is alert and oriented to person, place, and time.  Skin: Skin is warm and dry.  Psychiatric: She has a normal mood and affect. Her behavior is normal. Thought content normal.  Nursing note and vitals reviewed.   ED Course  Procedures (including critical care time) Labs Review Labs Reviewed  BASIC METABOLIC PANEL - Abnormal; Notable for the following:    Glucose, Bld 125 (*)    Calcium 8.7 (*)    All other components within normal limits  CBC  TROPONIN I    Imaging Review Dg Chest 2 View  07/03/2015  CLINICAL DATA:  Intermittent back pain.  Chest pain. EXAM: CHEST  2 VIEW COMPARISON:  CT chest 01/27/2015 FINDINGS: There is no focal parenchymal opacity. There is no pleural effusion or pneumothorax. The heart and mediastinal contours are unremarkable. There is thoracic aortic atherosclerosis. There  is a levoscoliosis of the thoracolumbar spine. There is a chronic mid thoracic spine vertebral body compression fracture. IMPRESSION: No active cardiopulmonary disease. Electronically Signed   By: Kathreen Devoid   On: 07/03/2015 15:02   I have personally reviewed and evaluated these images and lab results as part of my medical decision-making.   EKG Interpretation None       ED ECG REPORT I, Letetia Romanello, the attending physician, personally viewed and interpreted this ECG.   Date: 07/03/2015  EKG Time 13:58  Rate: 74  Rhythm: normal EKG, normal sinus rhythm  Axis: normal  Intervals:none  ST&T Change: nonspecific  MDM   Final diagnoses:  None    Cheryl Hall is a 75 y.o. female here with back pain, chest pain for 3 weeks. Has reproducible L parathoracic tenderness. She has no cardiac stents even though she had nonobstructive disease. Trop neg x 1 and pain for 3 weeks so will not need delta. I doubt dissection or PE. Likely muscle strain. Recommend NSAIDs, robaxin.    Wandra Arthurs, MD 07/03/15 1540

## 2015-07-03 NOTE — Discharge Instructions (Signed)
Take motrin 600 mg every 6 hrs for pain.  Take robaxin for muscle spasms.   See your doctor and your heart doctor for follow up. You may need another stress test if you still have pain.   Avoid heavy lifting.   Return to ER if you have worse chest pain, back pain, shortness of breath.

## 2015-07-13 DIAGNOSIS — J329 Chronic sinusitis, unspecified: Secondary | ICD-10-CM | POA: Diagnosis not present

## 2015-07-13 DIAGNOSIS — B9689 Other specified bacterial agents as the cause of diseases classified elsewhere: Secondary | ICD-10-CM | POA: Diagnosis not present

## 2015-08-19 ENCOUNTER — Ambulatory Visit (INDEPENDENT_AMBULATORY_CARE_PROVIDER_SITE_OTHER): Payer: Commercial Managed Care - HMO | Admitting: Cardiovascular Disease

## 2015-08-19 ENCOUNTER — Encounter: Payer: Self-pay | Admitting: Cardiovascular Disease

## 2015-08-19 VITALS — BP 138/78 | HR 64 | Ht 62.5 in | Wt 123.5 lb

## 2015-08-19 DIAGNOSIS — I214 Non-ST elevation (NSTEMI) myocardial infarction: Secondary | ICD-10-CM | POA: Diagnosis not present

## 2015-08-19 DIAGNOSIS — I10 Essential (primary) hypertension: Secondary | ICD-10-CM | POA: Diagnosis not present

## 2015-08-19 DIAGNOSIS — I2510 Atherosclerotic heart disease of native coronary artery without angina pectoris: Secondary | ICD-10-CM | POA: Diagnosis not present

## 2015-08-19 NOTE — Patient Instructions (Signed)
Medication Instructions: Continue same medications.   Labwork: None.   Procedures/Testing: None.   Follow-Up: 6 months with Dr. Kearia Yin.   Any Additional Special Instructions Will Be Listed Below (If Applicable).     If you need a refill on your cardiac medications before your next appointment, please call your pharmacy.   

## 2015-08-19 NOTE — Progress Notes (Signed)
Cardiology Office Note   Date:  08/19/2015   ID:  Cheryl Hall, DOB 1941-05-24, MRN [id number]  PCP:  Rusty Aus, MD  Cardiologist:   [name], MD   Chief Complaint  Patient presents with  . other    6 month follow up. Meds reviewed by the patient verbally. Pt. c/o right shoulder pain.       History of Present Illness: Cheryl Hall is a 75 y.o. 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 She has known history of hypertension, GERD hyperlipidemia, ongoing tobacco use and chronic kidney disease stage II .  She presented to [facility] in May of 2016 with a small non-ST elevation myocardial infarction in the setting of uncontrolled hypertension.  I proceeded with cardiac catheterization which showed mild to moderate two-vessel coronary artery disease involving the LAD and RCA. Ejection fraction was normal. Elevated troponin was felt to be due to high blood pressure. Carvedilol was added.  She continues to struggle with smoking cessation but overall she is doing well with no chest pain or significant dyspnea.   Past Medical History  Diagnosis Date  . Essential hypertension   . Hyperlipidemia   . GERD (gastroesophageal reflux disease)   . History of mania   . Anemia   . CKD (chronic kidney disease), stage II   . Chronic back pain   . History of sciatica   . H/O Vertebral Fractures   . Peripheral neuropathy ([place])   . Tobacco abuse   . Coronary artery disease     moderate 2-vessel disease    Past Surgical History  Procedure Laterality Date  . Cholecystectomy    . Cardiac catheterization N/A 10/21/2014    Procedure: Left Heart Cath and Coronary Angiography;  Surgeon: Wellington Hampshire, MD;  Location: [facility];  Service: Cardiovascular;  Laterality: N/A;  . Cardiac catheterization  10/21/2014    Dr. Fletcher Anon     Current Outpatient Prescriptions  Medication Sig Dispense Refill  . acetaminophen  (TYLENOL) 500 MG tablet Take 500 mg by mouth every 6 (six) hours as needed for mild pain or headache.     Marland Kitchen aspirin 81 MG EC tablet Take 1 tablet (81 mg total) by mouth daily. 30 tablet 1  . carvedilol (COREG) 6.25 MG tablet Take 1 tablet (6.25 mg total) by mouth 2 (two) times daily with a meal. 60 tablet 6  . Cholecalciferol (VITAMIN D-3 PO) Take 1 tablet by mouth daily.    . ferrous sulfate 325 (65 FE) MG tablet Take 325 mg by mouth daily.    Marland Kitchen lisinopril (PRINIVIL,ZESTRIL) 40 MG tablet TAKE ONE TABLET BY MOUTH ONCE DAILY 30 tablet 3  . methocarbamol (ROBAXIN) 500 MG tablet Take 1 tablet (500 mg total) by mouth every 6 (six) hours as needed for muscle spasms. 15 tablet 0  . nitroGLYCERIN (NITROSTAT) 0.4 MG SL tablet Place 0.4 mg under the tongue every 5 (five) minutes as needed for chest pain.    Marland Kitchen omeprazole (PRILOSEC) 20 MG capsule Take 20 mg by mouth daily.    . risperiDONE (RISPERDAL) 0.5 MG tablet Take 0.5 mg by mouth 2 (two) times daily.    . simvastatin (ZOCOR) 40 MG tablet Take 40 mg by mouth daily.    . vitamin B-12 (CYANOCOBALAMIN) 1000 MCG tablet Take 1,000 mcg by mouth daily.    . nicotine (NICODERM CQ - DOSED IN MG/24 HOURS) 14 mg/24hr patch Place  14 mg onto the skin daily. Reported on 08/19/2015     No current facility-administered medications for this visit.    Allergies:   Review of patient's allergies indicates no known allergies.    Social History:  The patient  reports that she has quit smoking. Her smoking use included Cigarettes. She has a 12.5 pack-year smoking history. She does not have any smokeless tobacco history on file. She reports that she does not drink alcohol or use illicit drugs.   Family History:  The patient's family history includes CAD in her brother; Cancer in her father; Other in her mother.    ROS:  Please see the history of present illness.   Otherwise, review of systems are positive for none.   All other systems are reviewed and negative.     PHYSICAL EXAM: VS:  BP 138/78 mmHg  Pulse 64  Ht 5' 2.5" (1.588 m)  Wt 123 lb 8 oz (56.019 kg)  BMI 22.21 kg/m2 , BMI Body mass index is 22.21 kg/(m^2). GEN: Well nourished, well developed, in no acute distress HEENT: normal Neck: no JVD, carotid bruits, or masses Cardiac: RRR; no murmurs, rubs, or gallops,no edema  Respiratory:  clear to auscultation bilaterally, normal work of breathing GI: soft, nontender, nondistended, + BS MS: no deformity or atrophy Skin: warm and dry, no rash Neuro:  Strength and sensation are intact Psych: euthymic mood, full affect   EKG:  EKG is ordered today. The ekg ordered today demonstrates normal sinus rhythm with no significant ST or T wave changes.   Recent Labs: 10/20/2014: B Natriuretic Peptide 72.0 07/03/2015: BUN 20; Creatinine, Ser 0.89; Hemoglobin 13.0; Platelets 157; Potassium 4.5; Sodium 139    Lipid Panel    Component Value Date/Time   CHOL 143 10/21/2014 0433   CHOL 149 01/31/2012 0454   TRIG 88 10/21/2014 0433   TRIG 111 01/31/2012 0454   HDL 56 10/21/2014 0433   HDL 58 01/31/2012 0454   CHOLHDL 2.6 10/21/2014 0433   VLDL 18 10/21/2014 0433   VLDL 22 01/31/2012 0454   LDLCALC 69 10/21/2014 0433   LDLCALC 69 01/31/2012 0454      Wt Readings from Last 3 Encounters:  08/19/15 123 lb 8 oz (56.019 kg)  07/03/15 123 lb (55.792 kg)  02/24/15 113 lb 4 oz (51.37 kg)        ASSESSMENT AND PLAN:  1.  Coronary artery disease involving native coronary arteries without angina: She is doing reasonably well with no anginal symptoms. Continue aggressive medical therapy.  2. Essential hypertension: Blood pressure is now reasonably controlled on current dose of carvedilol and lisinopril.  3. Hyperlipidemia: Most recent LDL was 69 on current dose of simvastatin. I recommend a target LDL of less than 70.  4. Tobacco use: She has not been able to quit completely but she is doing better overall. I discussed with her the importance  of complete smoking cessation.    Disposition:   FU with me in 6 months  Signed,  [name], MD  08/19/2015 10:51 AM    [place]

## 2015-08-24 ENCOUNTER — Other Ambulatory Visit: Payer: Self-pay | Admitting: Physician Assistant

## 2015-08-24 DIAGNOSIS — M810 Age-related osteoporosis without current pathological fracture: Secondary | ICD-10-CM | POA: Diagnosis not present

## 2015-08-24 DIAGNOSIS — Z1239 Encounter for other screening for malignant neoplasm of breast: Secondary | ICD-10-CM | POA: Diagnosis not present

## 2015-08-24 DIAGNOSIS — I2510 Atherosclerotic heart disease of native coronary artery without angina pectoris: Secondary | ICD-10-CM | POA: Diagnosis not present

## 2015-08-24 DIAGNOSIS — E782 Mixed hyperlipidemia: Secondary | ICD-10-CM | POA: Diagnosis not present

## 2015-08-24 DIAGNOSIS — F419 Anxiety disorder, unspecified: Secondary | ICD-10-CM | POA: Diagnosis not present

## 2015-08-24 DIAGNOSIS — Z1231 Encounter for screening mammogram for malignant neoplasm of breast: Secondary | ICD-10-CM

## 2015-08-24 DIAGNOSIS — I10 Essential (primary) hypertension: Secondary | ICD-10-CM | POA: Diagnosis not present

## 2015-08-24 DIAGNOSIS — Z720 Tobacco use: Secondary | ICD-10-CM | POA: Diagnosis not present

## 2015-08-24 DIAGNOSIS — N183 Chronic kidney disease, stage 3 (moderate): Secondary | ICD-10-CM | POA: Diagnosis not present

## 2015-08-30 ENCOUNTER — Other Ambulatory Visit: Payer: Self-pay | Admitting: *Deleted

## 2015-08-30 ENCOUNTER — Telehealth: Payer: Self-pay | Admitting: Cardiovascular Disease

## 2015-08-30 MED ORDER — CARVEDILOL 6.25 MG PO TABS: 6.2500 mg | ORAL_TABLET | Freq: Two times a day (BID) | ORAL | Status: DC

## 2015-08-30 NOTE — Telephone Encounter (Signed)
Requested Prescriptions   Signed Prescriptions Disp Refills  . carvedilol (COREG) 6.25 MG tablet 180 tablet 3    Sig: Take 1 tablet (6.25 mg total) by mouth 2 (two) times daily with a meal.    Authorizing Provider: Minna Merritts    Ordering User: Britt Bottom

## 2015-08-30 NOTE — Telephone Encounter (Signed)
°*  STAT* If patient is at the pharmacy, call can be transferred to refill team.   1. Which medications need to be refilled? (please list name of each medication and dose if known) Carvedilol   2. Which pharmacy/location (including street and city if local pharmacy) is medication to be sent to? [address] [facility]   3. Do they need a 30 day or 90 day supply? 90 day

## 2015-09-06 ENCOUNTER — Other Ambulatory Visit: Payer: Self-pay | Admitting: Physician Assistant

## 2015-09-06 ENCOUNTER — Ambulatory Visit
Admission: RE | Admit: 2015-09-06 | Discharge: 2015-09-06 | Disposition: A | Discharge status: Home / Self Care | Payer: Commercial Managed Care - HMO | Source: Ambulatory Visit | Attending: Physician Assistant | Admitting: Physician Assistant

## 2015-09-06 DIAGNOSIS — Z1231 Encounter for screening mammogram for malignant neoplasm of breast: Secondary | ICD-10-CM | POA: Diagnosis not present

## 2015-09-23 DIAGNOSIS — R399 Unspecified symptoms and signs involving the genitourinary system: Secondary | ICD-10-CM | POA: Diagnosis not present

## 2015-09-23 DIAGNOSIS — N390 Urinary tract infection, site not specified: Secondary | ICD-10-CM | POA: Diagnosis not present

## 2015-10-12 DIAGNOSIS — Z0000 Encounter for general adult medical examination without abnormal findings: Secondary | ICD-10-CM | POA: Diagnosis not present

## 2015-10-12 DIAGNOSIS — M810 Age-related osteoporosis without current pathological fracture: Secondary | ICD-10-CM | POA: Diagnosis not present

## 2015-11-17 DIAGNOSIS — I10 Essential (primary) hypertension: Secondary | ICD-10-CM | POA: Diagnosis not present

## 2015-11-17 DIAGNOSIS — E782 Mixed hyperlipidemia: Secondary | ICD-10-CM | POA: Diagnosis not present

## 2015-11-17 DIAGNOSIS — I2510 Atherosclerotic heart disease of native coronary artery without angina pectoris: Secondary | ICD-10-CM | POA: Diagnosis not present

## 2015-11-17 DIAGNOSIS — M810 Age-related osteoporosis without current pathological fracture: Secondary | ICD-10-CM | POA: Diagnosis not present

## 2015-11-17 DIAGNOSIS — F419 Anxiety disorder, unspecified: Secondary | ICD-10-CM | POA: Diagnosis not present

## 2015-11-17 DIAGNOSIS — N183 Chronic kidney disease, stage 3 (moderate): Secondary | ICD-10-CM | POA: Diagnosis not present

## 2015-11-24 DIAGNOSIS — N183 Chronic kidney disease, stage 3 (moderate): Secondary | ICD-10-CM | POA: Diagnosis not present

## 2015-11-24 DIAGNOSIS — I2510 Atherosclerotic heart disease of native coronary artery without angina pectoris: Secondary | ICD-10-CM | POA: Diagnosis not present

## 2015-11-24 DIAGNOSIS — E782 Mixed hyperlipidemia: Secondary | ICD-10-CM | POA: Diagnosis not present

## 2015-11-24 DIAGNOSIS — M810 Age-related osteoporosis without current pathological fracture: Secondary | ICD-10-CM | POA: Diagnosis not present

## 2015-11-24 DIAGNOSIS — Z720 Tobacco use: Secondary | ICD-10-CM | POA: Diagnosis not present

## 2015-11-24 DIAGNOSIS — I10 Essential (primary) hypertension: Secondary | ICD-10-CM | POA: Diagnosis not present

## 2015-11-24 DIAGNOSIS — K5730 Diverticulosis of large intestine without perforation or abscess without bleeding: Secondary | ICD-10-CM | POA: Diagnosis not present

## 2015-12-31 ENCOUNTER — Emergency Department
Admission: EM | Admit: 2015-12-31 | Discharge: 2015-12-31 | Disposition: A | Discharge status: Home / Self Care | Payer: Commercial Managed Care - HMO | Attending: Emergency Medicine | Admitting: Emergency Medicine

## 2015-12-31 ENCOUNTER — Emergency Department: Payer: Commercial Managed Care - HMO

## 2015-12-31 DIAGNOSIS — N182 Chronic kidney disease, stage 2 (mild): Secondary | ICD-10-CM | POA: Insufficient documentation

## 2015-12-31 DIAGNOSIS — I2510 Atherosclerotic heart disease of native coronary artery without angina pectoris: Secondary | ICD-10-CM | POA: Diagnosis not present

## 2015-12-31 DIAGNOSIS — K529 Noninfective gastroenteritis and colitis, unspecified: Secondary | ICD-10-CM | POA: Diagnosis not present

## 2015-12-31 DIAGNOSIS — Z7982 Long term (current) use of aspirin: Secondary | ICD-10-CM | POA: Diagnosis not present

## 2015-12-31 DIAGNOSIS — R109 Unspecified abdominal pain: Secondary | ICD-10-CM | POA: Diagnosis not present

## 2015-12-31 DIAGNOSIS — Z87891 Personal history of nicotine dependence: Secondary | ICD-10-CM | POA: Diagnosis not present

## 2015-12-31 DIAGNOSIS — I129 Hypertensive chronic kidney disease with stage 1 through stage 4 chronic kidney disease, or unspecified chronic kidney disease: Secondary | ICD-10-CM | POA: Insufficient documentation

## 2015-12-31 DIAGNOSIS — R1030 Lower abdominal pain, unspecified: Secondary | ICD-10-CM | POA: Diagnosis present

## 2015-12-31 LAB — CBC
HCT: 41.5 % (ref 35.0–47.0)
HEMATOCRIT: 39.7 % (ref 35.0–47.0)
HEMOGLOBIN: 14.3 g/dL (ref 12.0–16.0)
Hemoglobin: 13.8 g/dL (ref 12.0–16.0)
MCH: 32.4 pg (ref 26.0–34.0)
MCH: 32.9 pg (ref 26.0–34.0)
MCHC: 34.6 g/dL (ref 32.0–36.0)
MCHC: 34.4 g/dL (ref 32.0–36.0)
MCV: 93.5 fL (ref 80.0–100.0)
MCV: 95.6 fL (ref 80.0–100.0)
PLATELETS: 169 10*3/uL (ref 150–440)
Platelets: 154 10*3/uL (ref 150–440)
RBC: 4.25 MIL/uL (ref 3.80–5.20)
RBC: 4.34 MIL/uL (ref 3.80–5.20)
RDW: 14.2 % (ref 11.5–14.5)
RDW: 13.9 % (ref 11.5–14.5)
WBC: 13.3 10*3/uL — AB (ref 3.6–11.0)
WBC: 10.4 10*3/uL (ref 3.6–11.0)

## 2015-12-31 LAB — COMPREHENSIVE METABOLIC PANEL
ALT: 9 U/L — ABNORMAL LOW (ref 14–54)
ANION GAP: 7 (ref 5–15)
AST: 19 U/L (ref 15–41)
Albumin: 3.9 g/dL (ref 3.5–5.0)
Alkaline Phosphatase: 71 U/L (ref 38–126)
BUN: 20 mg/dL (ref 6–20)
CHLORIDE: 107 mmol/L (ref 101–111)
CO2: 24 mmol/L (ref 22–32)
CREATININE: 0.94 mg/dL (ref 0.44–1.00)
Calcium: 8.8 mg/dL — ABNORMAL LOW (ref 8.9–10.3)
GFR, EST NON AFRICAN AMERICAN: 59 mL/min — AB (ref >60)
Glucose, Bld: 101 mg/dL — ABNORMAL HIGH (ref 65–99)
POTASSIUM: 4 mmol/L (ref 3.5–5.1)
Sodium: 138 mmol/L (ref 135–145)
Total Bilirubin: 0.5 mg/dL (ref 0.3–1.2)
Total Protein: 6.4 g/dL — ABNORMAL LOW (ref 6.5–8.1)

## 2015-12-31 MED ORDER — METRONIDAZOLE 500 MG PO TABS: 500.0000 mg | ORAL_TABLET | Freq: Three times a day (TID) | ORAL | 0 refills | Status: AC

## 2015-12-31 MED ORDER — DIATRIZOATE MEGLUMINE & SODIUM 66-10 % PO SOLN
15.0000 mL | Freq: Once | ORAL | Status: AC
  Administered 2015-12-31: 15 mL via ORAL

## 2015-12-31 MED ORDER — ONDANSETRON HCL 4 MG/2ML IJ SOLN
INTRAMUSCULAR | Status: AC
  Filled 2015-12-31: qty 2

## 2015-12-31 MED ORDER — MORPHINE SULFATE (PF) 4 MG/ML IV SOLN
INTRAVENOUS | Status: AC
  Filled 2015-12-31: qty 1

## 2015-12-31 MED ORDER — IOPAMIDOL (ISOVUE-300) INJECTION 61%
100.0000 mL | Freq: Once | INTRAVENOUS | Status: AC | PRN
  Administered 2015-12-31: 100 mL via INTRAVENOUS

## 2015-12-31 MED ORDER — ONDANSETRON HCL 4 MG/2ML IJ SOLN
INTRAMUSCULAR | Status: AC
  Administered 2015-12-31: 4 mg via INTRAVENOUS
  Filled 2015-12-31: qty 2

## 2015-12-31 MED ORDER — CIPROFLOXACIN HCL 500 MG PO TABS: 500.0000 mg | ORAL_TABLET | Freq: Two times a day (BID) | ORAL | 0 refills | Status: AC

## 2015-12-31 MED ORDER — ONDANSETRON HCL 4 MG/2ML IJ SOLN
4.0000 mg | Freq: Once | INTRAMUSCULAR | Status: AC
  Administered 2015-12-31: 4 mg via INTRAVENOUS

## 2015-12-31 NOTE — ED Provider Notes (Signed)
-----------------------------------------   8:34 AM on 12/31/2015 -----------------------------------------  Repeat CBC without any concerning drop of hemoglobin. Discussed this with patient and discussed return precautions. Will plan on treating for colitis.    Nance Pear, MD 12/31/15 [phone]

## 2015-12-31 NOTE — Discharge Instructions (Signed)
Please seek medical attention for any high fevers, chest pain, shortness of breath, change in behavior, persistent vomiting, bloody stool or any other new or concerning symptoms.  

## 2015-12-31 NOTE — ED Triage Notes (Signed)
Patient reports noted blood in her stool and lower abdominal pain approximately 2 hours prior to arrival.  Patient reports history of diverticulitis.

## 2015-12-31 NOTE — ED Notes (Signed)
Pt alert and oriented X4, active, cooperative, pt in NAD. RR even and unlabored, color WNL.  Pt informed to return if any life threatening symptoms occur.   

## 2015-12-31 NOTE — ED Notes (Signed)
Report from Butch, RN  

## 2015-12-31 NOTE — ED Provider Notes (Signed)
[facility] Emergency Department Provider Note   ____________________________________________   First MD Initiated Contact with Patient 12/31/15 [phone]     (approximate)  I have reviewed the triage vital signs and the nursing notes.   HISTORY  Chief Complaint Blood In Stools and Abdominal Pain    HPI Cheryl Hall is a 75 y.o. female ho comes into the hospital todaywith some abdominal pain. She reports that she fell asleep and then woke up not feeling well. She reports that she had some cramping in her lower abdomen. She felt that she had to use the bathroom show she went in. Should a small amount of stool and little bit of blood in the toilet. She reports that she went back to the bathroom after some time and had some gas with again some more blood. The patient has had a history of diverticulitis in the past but reports that this doesn't feel exactly the same. She's had some stiff neck but denies any headache dizziness lightheadedness fever or chest pain shortness of breath. She reports that her abdominal pain is improved at this time but she is having some nausea with no vomiting. The patient is been urinating well. She was concerned about her symptoms that she decided to come into the hospital for evaluation.   Past Medical History:  Diagnosis Date  . Anemia   . Chronic back pain   . CKD (chronic kidney disease), stage II   . Coronary artery disease    moderate 2-vessel disease  . Essential hypertension   . GERD (gastroesophageal reflux disease)   . H/O Vertebral Fractures   . History of mania   . History of sciatica   . Hyperlipidemia   . Hypertension   . Peripheral neuropathy ([place])   . Tobacco abuse     Patient Active Problem List   Diagnosis Date Noted  . Coronary artery disease involving native coronary artery without angina pectoris 11/15/2014  . Hyperlipidemia   . Essential hypertension   . Tobacco abuse   . CKD (chronic kidney disease),  stage II   . GERD (gastroesophageal reflux disease)   . NSTEMI (non-ST elevated myocardial infarction) ([place]) 10/20/2014    Past Surgical History:  Procedure Laterality Date  . CARDIAC CATHETERIZATION N/A 10/21/2014   Procedure: Left Heart Cath and Coronary Angiography;  Surgeon: Wellington Hampshire, MD;  Location: [place] [facility];  Service: Cardiovascular;  Laterality: N/A;  . CARDIAC CATHETERIZATION  10/21/2014   Dr. Fletcher Anon  . CHOLECYSTECTOMY      Prior to Admission medications   Medication Sig Start Date End Date Taking? Authorizing Provider  acetaminophen (TYLENOL) 500 MG tablet Take 500 mg by mouth every 6 (six) hours as needed for mild pain or headache.     Historical Provider, MD  aspirin 81 MG EC tablet Take 1 tablet (81 mg total) by mouth daily. 10/21/14   Henreitta Leber, MD  carvedilol (COREG) 6.25 MG tablet Take 1 tablet (6.25 mg total) by mouth 2 (two) times daily with a meal. 08/30/15   Minna Merritts, MD  Cholecalciferol (VITAMIN D-3 PO) Take 1 tablet by mouth daily.    Historical Provider, MD  ferrous sulfate 325 (65 FE) MG tablet Take 325 mg by mouth daily.    Historical Provider, MD  lisinopril (PRINIVIL,ZESTRIL) 40 MG tablet TAKE ONE TABLET BY MOUTH ONCE DAILY 06/13/15   Wellington Hampshire, MD  methocarbamol (ROBAXIN) 500 MG tablet Take 1 tablet (500 mg total)  by mouth every 6 (six) hours as needed for muscle spasms. 07/03/15   Drenda Freeze, MD  nicotine (NICODERM CQ - DOSED IN MG/24 HOURS) 14 mg/24hr patch Place 14 mg onto the skin daily. Reported on 08/19/2015    Historical Provider, MD  nitroGLYCERIN (NITROSTAT) 0.4 MG SL tablet Place 0.4 mg under the tongue every 5 (five) minutes as needed for chest pain.    Historical Provider, MD  omeprazole (PRILOSEC) 20 MG capsule Take 20 mg by mouth daily.    Historical Provider, MD  risperiDONE (RISPERDAL) 0.5 MG tablet Take 0.5 mg by mouth 2 (two) times daily.    Historical Provider, MD  simvastatin (ZOCOR) 40 MG tablet Take 40 mg  by mouth daily.    Historical Provider, MD  vitamin B-12 (CYANOCOBALAMIN) 1000 MCG tablet Take 1,000 mcg by mouth daily.    Historical Provider, MD    Allergies Review of patient's allergies indicates no known allergies.  Family History  Problem Relation Age of Onset  . Other Mother     died @ 72.  . Cancer Father     died of pancreatic cancer  . CAD Brother     alive in late 86's w/ h/o stenting.  . Breast cancer Neg Hx     Social History Social History  Substance Use Topics  . Smoking status: Former Smoker    Packs/day: 0.25    Years: 50.00    Types: Cigarettes  . Smokeless tobacco: Not on file     Comment: Smoking 2 cigarettes every other day.   . Alcohol use No    Review of Systems Constitutional: No fever/chills Eyes: No visual changes. ENT: No sore throat. Cardiovascular: Denies chest pain. Respiratory: Denies shortness of breath. Gastrointestinal:  abdominal pain and bloody stool, No nausea, no vomiting.  No diarrhea.  No constipation. Genitourinary: Negative for dysuria. Musculoskeletal: Negative for back pain. Skin: Negative for rash. Neurological: Negative for headaches, focal weakness or numbness.  10-point ROS otherwise negative.  ____________________________________________   PHYSICAL EXAM:  VITAL SIGNS: ED Triage Vitals  Enc Vitals Group     BP 12/31/15 0121 (!) 182/90     Pulse Rate 12/31/15 0121 79     Resp 12/31/15 0526 18     Temp 12/31/15 0121 97.9 F (36.6 C)     Temp Source 12/31/15 0121 Oral     SpO2 12/31/15 0121 96 %     Weight 12/31/15 0121 126 lb (57.2 kg)     Height 12/31/15 0121 5\' 2"  (1.575 m)     Head Circumference --      Peak Flow --      Pain Score 12/31/15 0526 0     Pain Loc --      Pain Edu? --      Excl. in [place]? --     Constitutional: Alert and oriented. Well appearing and in moderate distress. Eyes: Conjunctivae are normal. PERRL. EOMI. Head: Atraumatic. Nose: No congestion/rhinnorhea. Mouth/Throat: Mucous  membranes are moist.  Oropharynx non-erythematous. Cardiovascular: Normal rate, regular rhythm. Grossly normal heart sounds.  Good peripheral circulation. Respiratory: Normal respiratory effort.  No retractions. Lungs CTAB. Rectal: grossly Heme positive stool, no masses Gastrointestinal: Soft and nontender. No distention. Positive bowel sounds Musculoskeletal: No lower extremity tenderness nor edema.  Neurologic:  Normal speech and language.  Skin:  Skin is warm, dry and intact.  Psychiatric: Mood and affect are normal.   ____________________________________________   LABS (all labs ordered are listed, but only abnormal  results are displayed)  Labs Reviewed  COMPREHENSIVE METABOLIC PANEL - Abnormal; Notable for the following:       Result Value   Glucose, Bld 101 (*)    Calcium 8.8 (*)    Total Protein 6.4 (*)    ALT 9 (*)    GFR calc non Af Amer 59 (*)    All other components within normal limits  CBC - Abnormal; Notable for the following:    WBC 13.3 (*)    All other components within normal limits  CBC   ____________________________________________  EKG  none ____________________________________________  RADIOLOGY  CT abd and pelvis: Wall thickening involving the proximal colon is identified compatible with segmental colitis. No pneumatosis, perforation or abscess identified , Aortic atherosclerosis, Mild increase caliber of the CBD status post cholecystectomy. Scoliosis and lumbar degenerative disc disease. Mild age-indeterminate compression deformities are noted within the lumbar spine. ____________________________________________   PROCEDURES  Procedure(s) performed: None  Procedures  Critical Care performed: No  ____________________________________________   INITIAL IMPRESSION / ASSESSMENT AND PLAN / ED COURSE  Pertinent labs & imaging results that were available during my care of the patient were reviewed by me and considered in my medical decision  making (see chart for details).  This is a 75 year old female who comes into the hospital today with abdominal pain. The patient reports that she was having some bloody stools as well. I Wilson the patient for CT scan for further evaluation of this pain  Clinical Course   I will repeat the patient's CBC to ensure that she has not decreasing her hemoglobin and hematocrit and the time that she's been here. If everything looks good to be discharged with some ciprofloxacin and Flagyl for her colitis. The patient's care will be signed out to Dr. Archie Balboa who will reassess the patient and disposition the patient.  ____________________________________________   FINAL CLINICAL IMPRESSION(S) / ED DIAGNOSES  Final diagnoses:  Colitis      NEW MEDICATIONS STARTED DURING THIS VISIT:  New Prescriptions   No medications on file     Note:  This document was prepared using Dragon voice recognition software and may include unintentional dictation errors.    Loney Hering, MD 12/31/15 [phone]

## 2016-01-12 DIAGNOSIS — I10 Essential (primary) hypertension: Secondary | ICD-10-CM | POA: Diagnosis not present

## 2016-01-12 DIAGNOSIS — K219 Gastro-esophageal reflux disease without esophagitis: Secondary | ICD-10-CM | POA: Diagnosis not present

## 2016-01-12 DIAGNOSIS — R110 Nausea: Secondary | ICD-10-CM | POA: Diagnosis not present

## 2016-01-12 DIAGNOSIS — K529 Noninfective gastroenteritis and colitis, unspecified: Secondary | ICD-10-CM | POA: Diagnosis not present

## 2016-02-16 ENCOUNTER — Ambulatory Visit (INDEPENDENT_AMBULATORY_CARE_PROVIDER_SITE_OTHER): Payer: Commercial Managed Care - HMO | Admitting: Cardiovascular Disease

## 2016-02-16 ENCOUNTER — Encounter: Payer: Self-pay | Admitting: Cardiovascular Disease

## 2016-02-16 VITALS — BP 130/84 | HR 77 | Ht 62.0 in | Wt 121.2 lb

## 2016-02-16 DIAGNOSIS — Z720 Tobacco use: Secondary | ICD-10-CM

## 2016-02-16 DIAGNOSIS — E785 Hyperlipidemia, unspecified: Secondary | ICD-10-CM | POA: Diagnosis not present

## 2016-02-16 DIAGNOSIS — I10 Essential (primary) hypertension: Secondary | ICD-10-CM | POA: Diagnosis not present

## 2016-02-16 DIAGNOSIS — I2510 Atherosclerotic heart disease of native coronary artery without angina pectoris: Secondary | ICD-10-CM | POA: Diagnosis not present

## 2016-02-16 NOTE — Progress Notes (Signed)
Cardiology Office Note   Date:  02/16/2016   ID:  Cheryl Hall, DOB 1940/10/15, MRN [id number]  PCP:  Marinda Elk, MD  Cardiologist:   [name], MD   Chief Complaint  Patient presents with  . other    6 month f/u c/o feeling hot. Meds reviewed verbally with pt.      History of Present Illness: Cheryl Hall is a 75 y.o. 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 She has known history of hypertension, GERD hyperlipidemia, ongoing tobacco use and chronic kidney disease stage II .  She presented to [facility] in May of 2016 with a small non-ST elevation myocardial infarction in the setting of uncontrolled hypertension.  I proceeded with cardiac catheterization which showed mild to moderate two-vessel coronary artery disease involving the LAD and RCA. Ejection fraction was normal. Elevated troponin was felt to be due to high blood pressure. Carvedilol was added.  She has been doing well with no chest pain. She has chronic exertional dyspnea which is stable. She cut down on smoking but has not been able to quit completely.   Past Medical History:  Diagnosis Date  . Anemia   . Chronic back pain   . CKD (chronic kidney disease), stage II   . Coronary artery disease    moderate 2-vessel disease  . Essential hypertension   . GERD (gastroesophageal reflux disease)   . H/O Vertebral Fractures   . History of mania   . History of sciatica   . Hyperlipidemia   . Hypertension   . Peripheral neuropathy ([place])   . Tobacco abuse     Past Surgical History:  Procedure Laterality Date  . CARDIAC CATHETERIZATION N/A 10/21/2014   Procedure: Left Heart Cath and Coronary Angiography;  Surgeon: Wellington Hampshire, MD;  Location: [place] [facility];  Service: Cardiovascular;  Laterality: N/A;  . CARDIAC CATHETERIZATION  10/21/2014   Dr. Fletcher Anon  . CHOLECYSTECTOMY       Current Outpatient Prescriptions  Medication Sig  Dispense Refill  . acetaminophen (TYLENOL) 500 MG tablet Take 500 mg by mouth every 6 (six) hours as needed for mild pain or headache.     Marland Kitchen aspirin 81 MG EC tablet Take 1 tablet (81 mg total) by mouth daily. 30 tablet 1  . carvedilol (COREG) 6.25 MG tablet Take 1 tablet (6.25 mg total) by mouth 2 (two) times daily with a meal. 180 tablet 3  . Cholecalciferol (VITAMIN D-3 PO) Take 1 tablet by mouth daily.    . ferrous sulfate 325 (65 FE) MG tablet Take 325 mg by mouth daily.    Marland Kitchen lisinopril (PRINIVIL,ZESTRIL) 40 MG tablet TAKE ONE TABLET BY MOUTH ONCE DAILY 30 tablet 3  . nitroGLYCERIN (NITROSTAT) 0.4 MG SL tablet Place 0.4 mg under the tongue every 5 (five) minutes as needed for chest pain.    Marland Kitchen omeprazole (PRILOSEC) 20 MG capsule Take 20 mg by mouth daily.    . risperiDONE (RISPERDAL) 0.5 MG tablet Take 0.5 mg by mouth 2 (two) times daily.    . simvastatin (ZOCOR) 40 MG tablet Take 40 mg by mouth daily.    . vitamin B-12 (CYANOCOBALAMIN) 1000 MCG tablet Take 1,000 mcg by mouth daily.     No current facility-administered medications for this visit.     Allergies:   Review of patient's allergies indicates no known allergies.    Social History:  The patient  reports that  she has been smoking Cigarettes.  She has a 12.50 pack-year smoking history. She has never used smokeless tobacco. She reports that she does not drink alcohol or use drugs.   Family History:  The patient's family history includes CAD in her brother; Cancer in her father; Other in her mother.    ROS:  Please see the history of present illness.   Otherwise, review of systems are positive for none.   All other systems are reviewed and negative.    PHYSICAL EXAM: VS:  BP 130/84 (BP Location: Left Arm, Patient Position: Sitting, Cuff Size: Normal)   Pulse 77   Ht 5\' 2"  (1.575 m)   Wt 121 lb 4 oz (55 kg)   BMI 22.18 kg/m  , BMI Body mass index is 22.18 kg/m. GEN: Well nourished, well developed, in no acute distress    HEENT: normal  Neck: no JVD, carotid bruits, or masses Cardiac: RRR; no murmurs, rubs, or gallops,no edema  Respiratory:  clear to auscultation bilaterally, normal work of breathing GI: soft, nontender, nondistended, + BS MS: no deformity or atrophy  Skin: warm and dry, no rash Neuro:  Strength and sensation are intact Psych: euthymic mood, full affect   EKG:  EKG is ordered today. The ekg ordered today demonstrates normal sinus rhythm with sinus arrhythmia.  Recent Labs: 12/31/2015: ALT 9; BUN 20; Creatinine, Ser 0.94; Hemoglobin 14.3; Platelets 154; Potassium 4.0; Sodium 138    Lipid Panel    Component Value Date/Time   CHOL 143 10/21/2014 0433   CHOL 149 01/31/2012 0454   TRIG 88 10/21/2014 0433   TRIG 111 01/31/2012 0454   HDL 56 10/21/2014 0433   HDL 58 01/31/2012 0454   CHOLHDL 2.6 10/21/2014 0433   VLDL 18 10/21/2014 0433   VLDL 22 01/31/2012 0454   LDLCALC 69 10/21/2014 0433   LDLCALC 69 01/31/2012 0454      Wt Readings from Last 3 Encounters:  02/16/16 121 lb 4 oz (55 kg)  12/31/15 126 lb (57.2 kg)  08/19/15 123 lb 8 oz (56 kg)        ASSESSMENT AND PLAN:  1.  Coronary artery disease involving native coronary arteries without angina: She is doing reasonably well with no anginal symptoms. Continue aggressive medical therapy.  2. Essential hypertension: Blood pressure is well-controlled on current medications .  3. Hyperlipidemia:Continue treatment with simvastatin with a target LDL of less than 70.   4. Tobacco use: S she cut down but has not been able to quit completely.   Disposition:   FU with me in 12 months  Signed,  [name], MD  02/16/2016 11:17 AM    [place]

## 2016-02-16 NOTE — Patient Instructions (Signed)
Medication Instructions: Continue same medications.   Labwork: None.   Procedures/Testing: None.   Follow-Up: 1 year with Dr. Ernst Cumpston.   Any Additional Special Instructions Will Be Listed Below (If Applicable).     If you need a refill on your cardiac medications before your next appointment, please call your pharmacy.   

## 2016-03-19 DIAGNOSIS — I10 Essential (primary) hypertension: Secondary | ICD-10-CM | POA: Diagnosis not present

## 2016-03-19 DIAGNOSIS — E782 Mixed hyperlipidemia: Secondary | ICD-10-CM | POA: Diagnosis not present

## 2016-03-19 DIAGNOSIS — R7303 Prediabetes: Secondary | ICD-10-CM | POA: Diagnosis not present

## 2016-03-19 DIAGNOSIS — I2510 Atherosclerotic heart disease of native coronary artery without angina pectoris: Secondary | ICD-10-CM | POA: Diagnosis not present

## 2016-03-26 DIAGNOSIS — I10 Essential (primary) hypertension: Secondary | ICD-10-CM | POA: Diagnosis not present

## 2016-03-26 DIAGNOSIS — I2510 Atherosclerotic heart disease of native coronary artery without angina pectoris: Secondary | ICD-10-CM | POA: Diagnosis not present

## 2016-03-26 DIAGNOSIS — M810 Age-related osteoporosis without current pathological fracture: Secondary | ICD-10-CM | POA: Diagnosis not present

## 2016-03-26 DIAGNOSIS — Z23 Encounter for immunization: Secondary | ICD-10-CM | POA: Diagnosis not present

## 2016-03-26 DIAGNOSIS — E782 Mixed hyperlipidemia: Secondary | ICD-10-CM | POA: Diagnosis not present

## 2016-03-26 DIAGNOSIS — R7303 Prediabetes: Secondary | ICD-10-CM | POA: Diagnosis not present

## 2016-04-10 ENCOUNTER — Other Ambulatory Visit: Payer: Self-pay | Admitting: Cardiovascular Disease

## 2016-05-30 DIAGNOSIS — H6123 Impacted cerumen, bilateral: Secondary | ICD-10-CM | POA: Diagnosis not present

## 2016-05-30 DIAGNOSIS — H9193 Unspecified hearing loss, bilateral: Secondary | ICD-10-CM | POA: Diagnosis not present

## 2016-06-20 DIAGNOSIS — H903 Sensorineural hearing loss, bilateral: Secondary | ICD-10-CM | POA: Diagnosis not present

## 2016-06-20 DIAGNOSIS — H6123 Impacted cerumen, bilateral: Secondary | ICD-10-CM | POA: Diagnosis not present

## 2016-06-27 DIAGNOSIS — H6122 Impacted cerumen, left ear: Secondary | ICD-10-CM | POA: Diagnosis not present

## 2016-08-18 ENCOUNTER — Encounter: Payer: Self-pay | Admitting: Emergency Medicine

## 2016-08-18 ENCOUNTER — Emergency Department: Payer: Medicare HMO

## 2016-08-18 ENCOUNTER — Inpatient Hospital Stay
Admission: EM | Admit: 2016-08-18 | Discharge: 2016-08-21 | DRG: 371 | Disposition: A | Discharge status: Home / Self Care | Payer: Medicare HMO | Attending: Internal Medicine | Admitting: Internal Medicine

## 2016-08-18 DIAGNOSIS — I700 Atherosclerosis of aorta: Secondary | ICD-10-CM | POA: Diagnosis present

## 2016-08-18 DIAGNOSIS — Z23 Encounter for immunization: Secondary | ICD-10-CM | POA: Diagnosis not present

## 2016-08-18 DIAGNOSIS — Z7982 Long term (current) use of aspirin: Secondary | ICD-10-CM

## 2016-08-18 DIAGNOSIS — I10 Essential (primary) hypertension: Secondary | ICD-10-CM | POA: Diagnosis not present

## 2016-08-18 DIAGNOSIS — K529 Noninfective gastroenteritis and colitis, unspecified: Secondary | ICD-10-CM | POA: Diagnosis present

## 2016-08-18 DIAGNOSIS — I252 Old myocardial infarction: Secondary | ICD-10-CM

## 2016-08-18 DIAGNOSIS — K922 Gastrointestinal hemorrhage, unspecified: Secondary | ICD-10-CM

## 2016-08-18 DIAGNOSIS — I708 Atherosclerosis of other arteries: Secondary | ICD-10-CM | POA: Diagnosis present

## 2016-08-18 DIAGNOSIS — R109 Unspecified abdominal pain: Secondary | ICD-10-CM

## 2016-08-18 DIAGNOSIS — R1084 Generalized abdominal pain: Secondary | ICD-10-CM | POA: Diagnosis not present

## 2016-08-18 DIAGNOSIS — Z8249 Family history of ischemic heart disease and other diseases of the circulatory system: Secondary | ICD-10-CM | POA: Diagnosis not present

## 2016-08-18 DIAGNOSIS — Z9049 Acquired absence of other specified parts of digestive tract: Secondary | ICD-10-CM | POA: Diagnosis not present

## 2016-08-18 DIAGNOSIS — F17210 Nicotine dependence, cigarettes, uncomplicated: Secondary | ICD-10-CM | POA: Diagnosis present

## 2016-08-18 DIAGNOSIS — I2510 Atherosclerotic heart disease of native coronary artery without angina pectoris: Secondary | ICD-10-CM | POA: Diagnosis present

## 2016-08-18 DIAGNOSIS — Z79899 Other long term (current) drug therapy: Secondary | ICD-10-CM

## 2016-08-18 DIAGNOSIS — Z800 Family history of malignant neoplasm of digestive organs: Secondary | ICD-10-CM | POA: Diagnosis not present

## 2016-08-18 DIAGNOSIS — K219 Gastro-esophageal reflux disease without esophagitis: Secondary | ICD-10-CM | POA: Diagnosis present

## 2016-08-18 DIAGNOSIS — G629 Polyneuropathy, unspecified: Secondary | ICD-10-CM | POA: Diagnosis present

## 2016-08-18 DIAGNOSIS — K625 Hemorrhage of anus and rectum: Secondary | ICD-10-CM | POA: Diagnosis not present

## 2016-08-18 DIAGNOSIS — Z716 Tobacco abuse counseling: Secondary | ICD-10-CM | POA: Diagnosis not present

## 2016-08-18 DIAGNOSIS — N182 Chronic kidney disease, stage 2 (mild): Secondary | ICD-10-CM | POA: Diagnosis not present

## 2016-08-18 DIAGNOSIS — K5792 Diverticulitis of intestine, part unspecified, without perforation or abscess without bleeding: Secondary | ICD-10-CM | POA: Diagnosis present

## 2016-08-18 DIAGNOSIS — N3090 Cystitis, unspecified without hematuria: Secondary | ICD-10-CM | POA: Diagnosis not present

## 2016-08-18 DIAGNOSIS — I7102 Dissection of abdominal aorta: Secondary | ICD-10-CM | POA: Diagnosis not present

## 2016-08-18 DIAGNOSIS — A0472 Enterocolitis due to Clostridium difficile, not specified as recurrent: Secondary | ICD-10-CM | POA: Diagnosis not present

## 2016-08-18 DIAGNOSIS — I129 Hypertensive chronic kidney disease with stage 1 through stage 4 chronic kidney disease, or unspecified chronic kidney disease: Secondary | ICD-10-CM | POA: Diagnosis present

## 2016-08-18 DIAGNOSIS — E785 Hyperlipidemia, unspecified: Secondary | ICD-10-CM | POA: Diagnosis not present

## 2016-08-18 HISTORY — DX: Diverticulitis of intestine, part unspecified, without perforation or abscess without bleeding: K57.92

## 2016-08-18 LAB — URINALYSIS, COMPLETE (UACMP) WITH MICROSCOPIC
BACTERIA UA: NONE SEEN
BILIRUBIN URINE: NEGATIVE
Glucose, UA: NEGATIVE mg/dL
Ketones, ur: 5 mg/dL — AB
LEUKOCYTES UA: NEGATIVE
NITRITE: NEGATIVE
PROTEIN: NEGATIVE mg/dL
SPECIFIC GRAVITY, URINE: 1.019 (ref 1.005–1.030)
pH: 6 (ref 5.0–8.0)

## 2016-08-18 LAB — COMPREHENSIVE METABOLIC PANEL
ALT: 9 U/L — AB (ref 14–54)
AST: 17 U/L (ref 15–41)
Albumin: 3.8 g/dL (ref 3.5–5.0)
Alkaline Phosphatase: 76 U/L (ref 38–126)
Anion gap: 8 (ref 5–15)
BILIRUBIN TOTAL: 0.8 mg/dL (ref 0.3–1.2)
BUN: 19 mg/dL (ref 6–20)
CALCIUM: 9 mg/dL (ref 8.9–10.3)
CO2: 23 mmol/L (ref 22–32)
CREATININE: 0.77 mg/dL (ref 0.44–1.00)
Chloride: 107 mmol/L (ref 101–111)
GFR calc Af Amer: >60 mL/min (ref >60)
Glucose, Bld: 92 mg/dL (ref 65–99)
Potassium: 3.9 mmol/L (ref 3.5–5.1)
Sodium: 138 mmol/L (ref 135–145)
TOTAL PROTEIN: 6.4 g/dL — AB (ref 6.5–8.1)

## 2016-08-18 LAB — TYPE AND SCREEN
ABO/RH(D): O POS
Antibody Screen: NEGATIVE

## 2016-08-18 LAB — LIPASE, BLOOD: LIPASE: 15 U/L (ref 11–51)

## 2016-08-18 LAB — CBC WITH DIFFERENTIAL/PLATELET
BASOS ABS: 0.1 10*3/uL (ref 0–0.1)
Basophils Relative: 1 %
Eosinophils Absolute: 0 10*3/uL (ref 0–0.7)
Eosinophils Relative: 0 %
HEMATOCRIT: 40.3 % (ref 35.0–47.0)
Hemoglobin: 13.7 g/dL (ref 12.0–16.0)
LYMPHS ABS: 1.6 10*3/uL (ref 1.0–3.6)
Lymphocytes Relative: 16 %
MCH: 31.8 pg (ref 26.0–34.0)
MCHC: 33.9 g/dL (ref 32.0–36.0)
MCV: 93.8 fL (ref 80.0–100.0)
MONO ABS: 0.4 10*3/uL (ref 0.2–0.9)
Monocytes Relative: 4 %
NEUTROS ABS: 7.8 10*3/uL — AB (ref 1.4–6.5)
Neutrophils Relative %: 79 %
Platelets: 156 10*3/uL (ref 150–440)
RBC: 4.3 MIL/uL (ref 3.80–5.20)
RDW: 13.7 % (ref 11.5–14.5)
WBC: 9.9 10*3/uL (ref 3.6–11.0)

## 2016-08-18 LAB — PROTIME-INR
INR: 0.95
Prothrombin Time: 12.7 seconds (ref 11.4–15.2)

## 2016-08-18 LAB — HEMOGLOBIN
HEMOGLOBIN: 13.2 g/dL (ref 12.0–16.0)
HEMOGLOBIN: 12.6 g/dL (ref 12.0–16.0)

## 2016-08-18 LAB — TROPONIN I: Troponin I: <0.03 ng/mL (ref <0.03)

## 2016-08-18 MED ORDER — IOPAMIDOL (ISOVUE-300) INJECTION 61%
30.0000 mL | Freq: Once | INTRAVENOUS | Status: AC | PRN
  Administered 2016-08-18: 30 mL via ORAL

## 2016-08-18 MED ORDER — ONDANSETRON HCL 4 MG PO TABS: 4.0000 mg | ORAL_TABLET | Freq: Four times a day (QID) | ORAL | Status: DC | PRN

## 2016-08-18 MED ORDER — NICOTINE 7 MG/24HR TD PT24
7.0000 mg | MEDICATED_PATCH | Freq: Every day | TRANSDERMAL | Status: DC
  Filled 2016-08-18 (×2): qty 1

## 2016-08-18 MED ORDER — IOPAMIDOL (ISOVUE-300) INJECTION 61%
100.0000 mL | Freq: Once | INTRAVENOUS | Status: AC | PRN
  Administered 2016-08-18: 100 mL via INTRAVENOUS

## 2016-08-18 MED ORDER — FERROUS SULFATE 325 (65 FE) MG PO TABS
325.0000 mg | ORAL_TABLET | Freq: Every day | ORAL | Status: DC
  Administered 2016-08-18 – 2016-08-21 (×4): 325 mg via ORAL
  Filled 2016-08-18 (×4): qty 1

## 2016-08-18 MED ORDER — METRONIDAZOLE IN NACL 5-0.79 MG/ML-% IV SOLN
500.0000 mg | Freq: Once | INTRAVENOUS | Status: AC
  Administered 2016-08-18: 500 mg via INTRAVENOUS
  Filled 2016-08-18: qty 100

## 2016-08-18 MED ORDER — POLYVINYL ALCOHOL 1.4 % OP SOLN
1.0000 [drp] | OPHTHALMIC | Status: DC | PRN
  Administered 2016-08-18 – 2016-08-19 (×2): 1 [drp] via OPHTHALMIC
  Filled 2016-08-18 (×2): qty 15

## 2016-08-18 MED ORDER — SIMVASTATIN 40 MG PO TABS
40.0000 mg | ORAL_TABLET | Freq: Every day | ORAL | Status: DC
  Administered 2016-08-18 – 2016-08-20 (×3): 40 mg via ORAL
  Filled 2016-08-18 (×3): qty 1

## 2016-08-18 MED ORDER — METRONIDAZOLE IN NACL 5-0.79 MG/ML-% IV SOLN
500.0000 mg | Freq: Four times a day (QID) | INTRAVENOUS | Status: DC
  Administered 2016-08-18 – 2016-08-19 (×4): 500 mg via INTRAVENOUS
  Filled 2016-08-18 (×7): qty 100

## 2016-08-18 MED ORDER — PANTOPRAZOLE SODIUM 40 MG PO TBEC
40.0000 mg | DELAYED_RELEASE_TABLET | Freq: Every day | ORAL | Status: DC
  Administered 2016-08-18 – 2016-08-21 (×4): 40 mg via ORAL
  Filled 2016-08-18 (×4): qty 1

## 2016-08-18 MED ORDER — ACETAMINOPHEN 650 MG RE SUPP: 650.0000 mg | Freq: Four times a day (QID) | RECTAL | Status: DC | PRN

## 2016-08-18 MED ORDER — AMOXICILLIN-POT CLAVULANATE 875-125 MG PO TABS
1.0000 | ORAL_TABLET | Freq: Once | ORAL | Status: AC
  Administered 2016-08-18: 1 via ORAL
  Filled 2016-08-18: qty 1

## 2016-08-18 MED ORDER — PNEUMOCOCCAL VAC POLYVALENT 25 MCG/0.5ML IJ INJ
0.5000 mL | INJECTION | INTRAMUSCULAR | Status: AC
  Administered 2016-08-21: 0.5 mL via INTRAMUSCULAR
  Filled 2016-08-18 (×3): qty 0.5

## 2016-08-18 MED ORDER — HYDRALAZINE HCL 20 MG/ML IJ SOLN: 10.0000 mg | Freq: Four times a day (QID) | INTRAMUSCULAR | Status: DC | PRN

## 2016-08-18 MED ORDER — ACETAMINOPHEN 325 MG PO TABS
650.0000 mg | ORAL_TABLET | Freq: Four times a day (QID) | ORAL | Status: DC | PRN
  Administered 2016-08-18 – 2016-08-19 (×2): 650 mg via ORAL
  Filled 2016-08-18 (×2): qty 2

## 2016-08-18 MED ORDER — KETOROLAC TROMETHAMINE 15 MG/ML IJ SOLN: 15.0000 mg | Freq: Four times a day (QID) | INTRAMUSCULAR | Status: DC | PRN

## 2016-08-18 MED ORDER — SODIUM CHLORIDE 0.9 % IV SOLN
INTRAVENOUS | Status: DC
  Administered 2016-08-18: 16:00:00 via INTRAVENOUS

## 2016-08-18 MED ORDER — RISPERIDONE 0.5 MG PO TABS
0.5000 mg | ORAL_TABLET | Freq: Two times a day (BID) | ORAL | Status: DC
  Administered 2016-08-18: 0.5 mg via ORAL
  Filled 2016-08-18 (×4): qty 1

## 2016-08-18 MED ORDER — CIPROFLOXACIN IN D5W 400 MG/200ML IV SOLN
400.0000 mg | Freq: Two times a day (BID) | INTRAVENOUS | Status: DC
  Administered 2016-08-18 – 2016-08-19 (×3): 400 mg via INTRAVENOUS
  Filled 2016-08-18 (×4): qty 200

## 2016-08-18 MED ORDER — CARVEDILOL 6.25 MG PO TABS
6.2500 mg | ORAL_TABLET | Freq: Two times a day (BID) | ORAL | Status: DC
  Administered 2016-08-18 – 2016-08-21 (×6): 6.25 mg via ORAL
  Filled 2016-08-18 (×6): qty 1

## 2016-08-18 MED ORDER — HYDROCODONE-ACETAMINOPHEN 5-325 MG PO TABS
1.0000 | ORAL_TABLET | ORAL | Status: DC | PRN
  Administered 2016-08-18: 2 via ORAL
  Administered 2016-08-19: 1 via ORAL
  Filled 2016-08-18: qty 2
  Filled 2016-08-18: qty 1

## 2016-08-18 MED ORDER — ONDANSETRON HCL 4 MG/2ML IJ SOLN: 4.0000 mg | Freq: Four times a day (QID) | INTRAMUSCULAR | Status: DC | PRN

## 2016-08-18 MED ORDER — LISINOPRIL 20 MG PO TABS
40.0000 mg | ORAL_TABLET | Freq: Every day | ORAL | Status: DC
  Administered 2016-08-19 – 2016-08-21 (×3): 40 mg via ORAL
  Filled 2016-08-18 (×3): qty 2

## 2016-08-18 MED ORDER — ALBUTEROL SULFATE (2.5 MG/3ML) 0.083% IN NEBU: 2.5000 mg | INHALATION_SOLUTION | RESPIRATORY_TRACT | Status: DC | PRN

## 2016-08-18 MED ORDER — VITAMIN B-12 1000 MCG PO TABS
1000.0000 ug | ORAL_TABLET | Freq: Every day | ORAL | Status: DC
  Administered 2016-08-18 – 2016-08-21 (×4): 1000 ug via ORAL
  Filled 2016-08-18 (×4): qty 1

## 2016-08-18 MED ORDER — NITROGLYCERIN 0.4 MG SL SUBL: 0.4000 mg | SUBLINGUAL_TABLET | SUBLINGUAL | Status: DC | PRN

## 2016-08-18 NOTE — ED Provider Notes (Signed)
[facility] Emergency Department Provider Note  ____________________________________________  Time seen: Approximately 11:10 AM  I have reviewed the triage vital signs and the nursing notes.   HISTORY  Chief Complaint Abdominal Pain and Rectal Bleeding   HPI Cheryl Hall is a 76 y.o. female history diverticulitis, hypertension, hyperlipidemia, CAD who presents for evaluation of abdominal pain. Patient reports that the pain started yesterday, it is sharp and located in the lower quadrants of her abdomen, intermittent, mild at this time, and similar to prior episode of diverticulitis. She has had diarrhea associated with it and reports that every time she wipes herself after a bowel movement she sees blood in the toilet paper. She has not noticed blood in the toilet or melena. She has had nausea but no vomiting. No fever or chills, no dysuria or hematuria. No URI symptoms, chest pain or shortness of breath.  Past Medical History:  Diagnosis Date  . Anemia   . Chronic back pain   . CKD (chronic kidney disease), stage II   . Coronary artery disease    moderate 2-vessel disease  . Diverticulitis   . Essential hypertension   . GERD (gastroesophageal reflux disease)   . H/O Vertebral Fractures   . History of mania   . History of sciatica   . Hyperlipidemia   . Hypertension   . Peripheral neuropathy ([place])   . Tobacco abuse     Patient Active Problem List   Diagnosis Date Noted  . Acute colitis 08/18/2016  . Coronary artery disease involving native coronary artery without angina pectoris 11/15/2014  . Hyperlipidemia   . Essential hypertension   . Tobacco abuse   . CKD (chronic kidney disease), stage II   . GERD (gastroesophageal reflux disease)   . NSTEMI (non-ST elevated myocardial infarction) ([place]) 10/20/2014    Past Surgical History:  Procedure Laterality Date  . CARDIAC CATHETERIZATION N/A 10/21/2014   Procedure: Left Heart Cath and Coronary  Angiography;  Surgeon: Wellington Hampshire, MD;  Location: [place] [facility];  Service: Cardiovascular;  Laterality: N/A;  . CARDIAC CATHETERIZATION  10/21/2014   Dr. Fletcher Anon  . CHOLECYSTECTOMY      Prior to Admission medications   Medication Sig Start Date End Date Taking? Authorizing Provider  acetaminophen (TYLENOL) 500 MG tablet Take 500 mg by mouth every 6 (six) hours as needed for mild pain or headache.    Yes Historical Provider, MD  aspirin 81 MG EC tablet Take 1 tablet (81 mg total) by mouth daily. 10/21/14  Yes Henreitta Leber, MD  carvedilol (COREG) 6.25 MG tablet Take 1 tablet (6.25 mg total) by mouth 2 (two) times daily with a meal. 08/30/15  Yes Minna Merritts, MD  Cholecalciferol (VITAMIN D-3 PO) Take 1 tablet by mouth daily.   Yes Historical Provider, MD  ferrous sulfate 325 (65 FE) MG tablet Take 325 mg by mouth daily.   Yes Historical Provider, MD  lisinopril (PRINIVIL,ZESTRIL) 40 MG tablet TAKE ONE TABLET BY MOUTH ONCE DAILY 04/10/16  Yes Wellington Hampshire, MD  nitroGLYCERIN (NITROSTAT) 0.4 MG SL tablet Place 0.4 mg under the tongue every 5 (five) minutes as needed for chest pain.   Yes Historical Provider, MD  omeprazole (PRILOSEC) 20 MG capsule Take 20 mg by mouth daily.   Yes Historical Provider, MD  risperiDONE (RISPERDAL) 0.5 MG tablet Take 0.5 mg by mouth 2 (two) times daily.   Yes Historical Provider, MD  simvastatin (ZOCOR) 40 MG tablet Take  40 mg by mouth daily.   Yes Historical Provider, MD  vitamin B-12 (CYANOCOBALAMIN) 1000 MCG tablet Take 1,000 mcg by mouth daily.   Yes Historical Provider, MD    Allergies Patient has no known allergies.  Family History  Problem Relation Age of Onset  . Other Mother     died @ 94.  . Cancer Father     died of pancreatic cancer  . CAD Brother     alive in late 65's w/ h/o stenting.  . Breast cancer Neg Hx     Social History Social History  Substance Use Topics  . Smoking status: Current Some Day Smoker    Packs/day:  0.25    Years: 50.00    Types: Cigarettes  . Smokeless tobacco: Never Used     Comment: Smoking 2 cigarettes every other day.   . Alcohol use No    Review of Systems  Constitutional: Negative for fever. Eyes: Negative for visual changes. ENT: Negative for sore throat. Neck: No neck pain  Cardiovascular: Negative for chest pain. Respiratory: Negative for shortness of breath. Gastrointestinal: + Lower abdominal pain, nausea, and hematochezia. No vomiting  Genitourinary: Negative for dysuria. Musculoskeletal: Negative for back pain. Skin: Negative for rash. Neurological: Negative for headaches, weakness or numbness. Psych: No SI or HI  ____________________________________________   PHYSICAL EXAM:  VITAL SIGNS: ED Triage Vitals  Enc Vitals Group     BP 08/18/16 0927 (!) 179/93     Pulse Rate 08/18/16 0927 63     Resp 08/18/16 0927 15     Temp 08/18/16 0927 97.9 F (36.6 C)     Temp Source 08/18/16 0927 Oral     SpO2 08/18/16 0927 99 %     Weight --      Height --      Head Circumference --      Peak Flow --      Pain Score 08/18/16 0923 8     Pain Loc --      Pain Edu? --      Excl. in [place]? --     Constitutional: Alert and oriented. Well appearing and in no apparent distress. HEENT:      Head: Normocephalic and atraumatic.         Eyes: Conjunctivae are normal. Sclera is non-icteric. EOMI. PERRL      Mouth/Throat: Mucous membranes are moist.       Neck: Supple with no signs of meningismus. Cardiovascular: Regular rate and rhythm. No murmurs, gallops, or rubs. 2+ symmetrical distal pulses are present in all extremities. No JVD. Respiratory: Normal respiratory effort. Lungs are clear to auscultation bilaterally. No wheezes, crackles, or rhonchi.  Gastrointestinal: Soft, Tenderness to palpation on the lower quadrant, and non distended with positive bowel sounds. No rebound or guarding. Genitourinary: No CVA tenderness. Rectal exam showing red blood and guaiac  positive Musculoskeletal: Nontender with normal range of motion in all extremities. No edema, cyanosis, or erythema of extremities. Neurologic: Normal speech and language. Face is symmetric. Moving all extremities. No gross focal neurologic deficits are appreciated. Skin: Skin is warm, dry and intact. No rash noted. Psychiatric: Mood and affect are normal. Speech and behavior are normal.  ____________________________________________   LABS (all labs ordered are listed, but only abnormal results are displayed)  Labs Reviewed  CBC WITH DIFFERENTIAL/PLATELET - Abnormal; Notable for the following:       Result Value   Neutro Abs 7.8 (*)    All other components within normal  limits  COMPREHENSIVE METABOLIC PANEL - Abnormal; Notable for the following:    Total Protein 6.4 (*)    ALT 9 (*)    All other components within normal limits  URINALYSIS, COMPLETE (UACMP) WITH MICROSCOPIC - Abnormal; Notable for the following:    Color, Urine YELLOW (*)    APPearance CLEAR (*)    Hgb urine dipstick MODERATE (*)    Ketones, ur 5 (*)    Squamous Epithelial / LPF 0-5 (*)    All other components within normal limits  LIPASE, BLOOD  PROTIME-INR  HEMOGLOBIN  HEMOGLOBIN  TYPE AND SCREEN   ____________________________________________  EKG  none ____________________________________________  RADIOLOGY  CT a/p: Acute colitis at the splenic flexure. Given the changes of diverticular disease, leading differential would be acute diverticulitis. Alternative etiology should also be considered, including infectious/inflammatory as well as ischemic given the location and background changes of vascular disease [place] point).  Aortic atherosclerosis.  Simple appearing right adnexal cyst measures 2.5 cm  ____________________________________________   PROCEDURES  Procedure(s) performed: None Procedures Critical Care performed:  None ____________________________________________   INITIAL  IMPRESSION / ASSESSMENT AND PLAN / ED COURSE  76 y.o. female history diverticulitis, hypertension, hyperlipidemia, CAD who presents for evaluation of 1 day of lower abdominal pain and hematochezia. Patient is well-appearing, in no distress, is normal vital signs, abdominal exam shows tenderness to palpation on the lower quadrants with no rebound or guarding. CBC with no leukocytosis or anemia. UA with no evidence of urinary tract infection. CMP, lipase, INR within normal limits. We'll send patient for CT abdomen and pelvis to evaluate for evidence of diverticulitis. We'll monitor patient on telemetry.  Clinical Course as of Aug 19 1531  Sat Aug 18, 2016  1312 CT concerning for colitis/ diverticulitis. Hgb stable but since patient is bleeding per rectum, will admit for obs and trending hgb. Patient will be given augmentin and flagyl.  [CV]    Clinical Course User Index [CV] Rudene Re, MD    Pertinent labs & imaging results that were available during my care of the patient were reviewed by me and considered in my medical decision making (see chart for details).    ____________________________________________   FINAL CLINICAL IMPRESSION(S) / ED DIAGNOSES  Final diagnoses:  Colitis  Lower GI bleed      NEW MEDICATIONS STARTED DURING THIS VISIT:  Current Discharge Medication List       Note:  This document was prepared using Dragon voice recognition software and may include unintentional dictation errors.    Rudene Re, MD 08/18/16 1534

## 2016-08-18 NOTE — Progress Notes (Signed)
Pt complains of left chest pain radiating to the back. MD notified. Order to cycle 2 troponins. I will continue to assess.

## 2016-08-18 NOTE — ED Notes (Signed)
Patient given instructions on using the call bell if needed to access the room commode. Patient expressed concerns that the oral contrast for the CT scan will cause diarrhea. Patient declined a bedside commode.

## 2016-08-18 NOTE — Consult Note (Addendum)
Consultation  Referring Provider:     No ref. provider found Primary Care Physician:  Marinda Elk, MD Primary Gastroenterologist:  None         Reason for Consultation:  Abdominal pain, abnormal CT  Date of Admission:  08/18/2016 Date of Consultation:  08/18/2016         HPI:   Cheryl Hall is a 76 y.o. female with PHM as listed below who was in normal state of health until Friday evening when she started experiencing left sided abdominal pain a/w diarrhea and bright red blood on wiping. She had several bouts of diarrhea yesterday. She is on clear liquid diet today and having watery stools. She denies having abdominal pain today. She had similar episode in the past which was secondary to diverticulitis. She generally has 1-2 formed BMs daily without any abdominal pain. She does intermittently ~ 1-2 days/month experience diarrhea for which she takes imodium, that helps. CT in ER revealed splenic flexure thickening. She is started on cipro/flagyl. She currently smokes tobacco. Her last colonoscopy was in 09/2012, no polyps were identified  Past Medical History:  Diagnosis Date  . Anemia   . Chronic back pain   . CKD (chronic kidney disease), stage II   . Coronary artery disease    moderate 2-vessel disease  . Diverticulitis   . Essential hypertension   . GERD (gastroesophageal reflux disease)   . H/O Vertebral Fractures   . History of mania   . History of sciatica   . Hyperlipidemia   . Hypertension   . Peripheral neuropathy ([place])   . Tobacco abuse     Past Surgical History:  Procedure Laterality Date  . CARDIAC CATHETERIZATION N/A 10/21/2014   Procedure: Left Heart Cath and Coronary Angiography;  Surgeon: Wellington Hampshire, MD;  Location: [facility];  Service: Cardiovascular;  Laterality: N/A;  . CARDIAC CATHETERIZATION  10/21/2014   Dr. Fletcher Anon  . CHOLECYSTECTOMY      Prior to Admission medications   Medication Sig Start Date End Date Taking? Authorizing  Provider  acetaminophen (TYLENOL) 500 MG tablet Take 500 mg by mouth every 6 (six) hours as needed for mild pain or headache.    Yes Historical Provider, MD  aspirin 81 MG EC tablet Take 1 tablet (81 mg total) by mouth daily. 10/21/14  Yes Henreitta Leber, MD  carvedilol (COREG) 6.25 MG tablet Take 1 tablet (6.25 mg total) by mouth 2 (two) times daily with a meal. 08/30/15  Yes Minna Merritts, MD  Cholecalciferol (VITAMIN D-3 PO) Take 1 tablet by mouth daily.   Yes Historical Provider, MD  ferrous sulfate 325 (65 FE) MG tablet Take 325 mg by mouth daily.   Yes Historical Provider, MD  lisinopril (PRINIVIL,ZESTRIL) 40 MG tablet TAKE ONE TABLET BY MOUTH ONCE DAILY 04/10/16  Yes Wellington Hampshire, MD  nitroGLYCERIN (NITROSTAT) 0.4 MG SL tablet Place 0.4 mg under the tongue every 5 (five) minutes as needed for chest pain.   Yes Historical Provider, MD  omeprazole (PRILOSEC) 20 MG capsule Take 20 mg by mouth daily.   Yes Historical Provider, MD  risperiDONE (RISPERDAL) 0.5 MG tablet Take 0.5 mg by mouth 2 (two) times daily.   Yes Historical Provider, MD  simvastatin (ZOCOR) 40 MG tablet Take 40 mg by mouth daily.   Yes Historical Provider, MD  vitamin B-12 (CYANOCOBALAMIN) 1000 MCG tablet Take 1,000 mcg by mouth daily.   Yes Historical Provider, MD  Family History  Problem Relation Age of Onset  . Other Mother     died @ 39.  . Cancer Father     died of pancreatic cancer  . CAD Brother     alive in late 92's w/ h/o stenting.  . Breast cancer Neg Hx      Social History  Substance Use Topics  . Smoking status: Current Some Day Smoker    Packs/day: 0.25    Years: 50.00    Types: Cigarettes  . Smokeless tobacco: Never Used     Comment: Smoking 2 cigarettes every other day.   . Alcohol use No    Allergies as of 08/18/2016  . (No Known Allergies)    Review of Systems:    All systems reviewed and negative except where noted in HPI.   Physical Exam:  Vital signs in last 24  hours: Temp:  [97.5 F (36.4 C)-97.9 F (36.6 C)] 97.5 F (36.4 C) (03/24 1507) Pulse Rate:  [61-77] 70 (03/24 1507) Resp:  [13-17] 17 (03/24 1507) BP: (112-179)/(63-95) 112/63 (03/24 1507) SpO2:  [95 %-99 %] 99 % (03/24 1507) Weight:  [56.4 kg (124 lb 4.8 oz)] 56.4 kg (124 lb 4.8 oz) (03/24 1507)   General:   Pleasant, cooperative in NAD Head:  Normocephalic and atraumatic. Eyes:   No icterus.   Conjunctiva pink. PERRLA. Ears:  Normal auditory acuity. Neck:  Supple; no masses or thyroidomegaly Lungs: Respirations even and unlabored. Lungs clear to auscultation bilaterally.   No wheezes, crackles, or rhonchi.  Heart:  Regular rate and rhythm;  Without murmur, clicks, rubs or gallops Abdomen:  Soft, nondistended, nontender. Normal bowel sounds. No appreciable masses or hepatomegaly.  No rebound or guarding.  Rectal:  Not performed. Msk:  Symmetrical without gross deformities.  Strength good  Extremities:  Without edema, cyanosis or clubbing. Neurologic:  Alert and oriented x3;  grossly normal neurologically. Skin:  Intact without significant lesions or rashes. Cervical Nodes:  No significant cervical adenopathy. Psych:  Alert and cooperative. Normal affect.  LAB RESULTS:  Recent Labs  08/18/16 0950  WBC 9.9  HGB 13.7  HCT 40.3  PLT 156   BMET  Recent Labs  08/18/16 0950  NA 138  K 3.9  CL 107  CO2 23  GLUCOSE 92  BUN 19  CREATININE 0.77  CALCIUM 9.0   LFT  Recent Labs  08/18/16 0950  PROT 6.4*  ALBUMIN 3.8  AST 17  ALT 9*  ALKPHOS 76  BILITOT 0.8   PT/INR  Recent Labs  08/18/16 0950  LABPROT 12.7  INR 0.95    STUDIES: Ct Abdomen Pelvis W Contrast  Result Date: 08/18/2016 CLINICAL DATA:  75 year old female with a history of abdominal and pelvic pain with rectal bleeding. History of diverticular disease EXAM: CT ABDOMEN AND PELVIS WITH CONTRAST TECHNIQUE: Multidetector CT imaging of the abdomen and pelvis was performed using the standard  protocol following bolus administration of intravenous contrast. CONTRAST:  131mL ISOVUE-300 IOPAMIDOL (ISOVUE-300) INJECTION 61% COMPARISON:  12/31/2015 FINDINGS: Lower chest: Heart size unchanged. Calcifications of the right coronary artery. No pericardial fluid/ thickening. Respiratory motion somewhat limits evaluation of the lung bases. No pleural effusion. Hepatobiliary: Surgical changes of cholecystectomy. Unremarkable liver parenchyma. Dilated common bile duct measuring 11 mm, measuring slightly greater than from comparison. Pancreas: Unremarkable. No pancreatic ductal dilatation or surrounding inflammatory changes. Spleen: Normal in size without focal abnormality. Adrenals/Urinary Tract: Unremarkable appearance of the bilateral adrenal glands. No evidence of hydronephrosis. No nephrolithiasis. Unremarkable  course the bilateral ureters. Re- demonstration of subcentimeter hypodense lesions of the right kidney with the largest on the lateral cortex. This has features most compatible with benign cyst. Stomach/Bowel: Unremarkable appearance of stomach. Unremarkable small bowel. No abnormal distention of the small bowel. Enteric contrast present within stomach, small bowel, extending to distal small bowel. Presence of the enteric contrast limits evaluation of the bowel wall enhancement. Circumferential wall thickening of the splenic flexure of the colon with associated inflammatory changes and small fluid of the adjacent mesenteric and peritoneal. Diverticular changes are present within colon. Normal appendix. Vascular/Lymphatic: Aortic atherosclerosis. No aneurysm. No dissection flap. Bilateral iliac system remain patent. Proximal femoral vasculature patent. Origin of the celiac artery patent. Origin of the superior mesenteric artery patent. Patency of the inferior mesenteric artery origin uncertain. Reproductive: Unremarkable appearance of the uterus. Re- demonstration of low-density cystic structure associated  with the right adnexa. This measured 2.1 cm previously, now 2.5 cm. Cystic structure appears to have a thin wall with no internal soft tissue. Other: No abdominal wall hernia. Musculoskeletal: Posttraumatic changes of the pelvis. No acute fracture identified. Degenerative changes of the visualized spine. IMPRESSION: Acute colitis at the splenic flexure. Given the changes of diverticular disease, leading differential would be acute diverticulitis. Alternative etiology should also be considered, including infectious/inflammatory as well as ischemic given the location and background changes of vascular disease Cheryl Hall). Aortic atherosclerosis. Simple appearing right adnexal cyst measures 2.5 cm. Consensus recommendations would be for annual surveillance in a patient of this age, and referral for gynecologic evaluation should be considered if not already initiated. These results were called by telephone at the time of interpretation on 08/18/2016 at 1:07 pm to Dr. Rudene Re , who verbally acknowledged these results. Electronically Signed   By: Corrie Mckusick D.O.   On: 08/18/2016 13:07      Impression / Plan:   ELEXIUS MINAR is a 76 y.o. y/o female with left sided abdominal pain and diarrhea with minimal rectal bleeding, CT with splenic flexure thickening. Since she is already improving with antibiotics which suggest acute diverticulitis. Ischemic colitis is another possibility given h/o smoking and CAD, but less likely  - Continue cipro/flagyl - If she is having ongoing diarrhea, check C Diff - No colonoscopic intervention at this time - If she continues to improve, anticipate home tomorrow on 5days of cipro/flagyl - CT angio per primary  Thank you for involving me in the care of this patient.      LOS: 0 days   Sherri Sear, MD  08/18/2016, 3:48 PM

## 2016-08-18 NOTE — ED Notes (Signed)
Patient transported to CT 

## 2016-08-18 NOTE — ED Triage Notes (Addendum)
Patient to ER from [facility] for c/o abdominal pain and rectal bleeding since yesterday. Patient has h/o diverticulitis, states pain feels the same.

## 2016-08-18 NOTE — Progress Notes (Signed)
Pts left eye is red and irritated. Protocol eye gtt ordered. I will continue to assess.

## 2016-08-18 NOTE — H&P (Signed)
[place] [facility] [place] NAME: Cheryl Hall    MR#:  [id number]  DATE OF BIRTH:  May 08, 1941  DATE OF ADMISSION:  08/18/2016  PRIMARY CARE PHYSICIAN: Marinda Elk, MD   REQUESTING/REFERRING PHYSICIAN: Rudene Re, MD  CHIEF COMPLAINT:   Chief Complaint  Patient presents with  . Abdominal Pain  . Rectal Bleeding   Left lower abdominal pain since yesterday. HISTORY OF PRESENT ILLNESS:  Cheryl Hall  is a 76 y.o. female with a known history of Diverticulitis, CKD, CAD, hypertension and hyperlipidemia. The patient started to have a left lower abdominal pain since yesterday, which is a sharp, intermittent without radiation, associated with nausea and vomiting. Patient also complains of several times of diarrhea with bloody stool. But she denies any fever or chills. CAT scan of abdomen showed acute colitis and diverticulitis.  PAST MEDICAL HISTORY:   Past Medical History:  Diagnosis Date  . Anemia   . Chronic back pain   . CKD (chronic kidney disease), stage II   . Coronary artery disease    moderate 2-vessel disease  . Diverticulitis   . Essential hypertension   . GERD (gastroesophageal reflux disease)   . H/O Vertebral Fractures   . History of mania   . History of sciatica   . Hyperlipidemia   . Hypertension   . Peripheral neuropathy ([place])   . Tobacco abuse     PAST SURGICAL HISTORY:   Past Surgical History:  Procedure Laterality Date  . CARDIAC CATHETERIZATION N/A 10/21/2014   Procedure: Left Heart Cath and Coronary Angiography;  Surgeon: Wellington Hampshire, MD;  Location: [facility];  Service: Cardiovascular;  Laterality: N/A;  . CARDIAC CATHETERIZATION  10/21/2014   Dr. Fletcher Anon  . CHOLECYSTECTOMY      SOCIAL HISTORY:   Social History  Substance Use Topics  . Smoking status: Current Some Day Smoker    Packs/day: 0.25    Years: 50.00    Types: Cigarettes  . Smokeless tobacco: Never Used     Comment:  Smoking 2 cigarettes every other day.   . Alcohol use No    FAMILY HISTORY:   Family History  Problem Relation Age of Onset  . Other Mother     died @ 90.  . Cancer Father     died of pancreatic cancer  . CAD Brother     alive in late 46's w/ h/o stenting.  . Breast cancer Neg Hx     DRUG ALLERGIES:  No Known Allergies  REVIEW OF SYSTEMS:   Review of Systems  Constitutional: Negative for chills, fever and malaise/fatigue.  HENT: Negative for congestion and sore throat.   Eyes: Negative for blurred vision and double vision.  Respiratory: Negative for cough, hemoptysis, sputum production and shortness of breath.   Cardiovascular: Negative for chest pain and leg swelling.  Gastrointestinal: Positive for abdominal pain, blood in stool, diarrhea, nausea and vomiting. Negative for melena.  Genitourinary: Negative for dysuria, frequency and hematuria.  Musculoskeletal: Negative for back pain.  Skin: Negative for rash.  Neurological: Negative for dizziness, focal weakness, loss of consciousness, weakness and headaches.  Psychiatric/Behavioral: Negative for depression. The patient is not nervous/anxious.     MEDICATIONS AT HOME:   Prior to Admission medications   Medication Sig Start Date End Date Taking? Authorizing Provider  acetaminophen (TYLENOL) 500 MG tablet Take 500 mg by mouth every 6 (six) hours as needed for mild pain or headache.  Yes Historical Provider, MD  aspirin 81 MG EC tablet Take 1 tablet (81 mg total) by mouth daily. 10/21/14  Yes Henreitta Leber, MD  carvedilol (COREG) 6.25 MG tablet Take 1 tablet (6.25 mg total) by mouth 2 (two) times daily with a meal. 08/30/15  Yes Minna Merritts, MD  Cholecalciferol (VITAMIN D-3 PO) Take 1 tablet by mouth daily.   Yes Historical Provider, MD  ferrous sulfate 325 (65 FE) MG tablet Take 325 mg by mouth daily.   Yes Historical Provider, MD  lisinopril (PRINIVIL,ZESTRIL) 40 MG tablet TAKE ONE TABLET BY MOUTH ONCE DAILY  04/10/16  Yes Wellington Hampshire, MD  nitroGLYCERIN (NITROSTAT) 0.4 MG SL tablet Place 0.4 mg under the tongue every 5 (five) minutes as needed for chest pain.   Yes Historical Provider, MD  omeprazole (PRILOSEC) 20 MG capsule Take 20 mg by mouth daily.   Yes Historical Provider, MD  risperiDONE (RISPERDAL) 0.5 MG tablet Take 0.5 mg by mouth 2 (two) times daily.   Yes Historical Provider, MD  simvastatin (ZOCOR) 40 MG tablet Take 40 mg by mouth daily.   Yes Historical Provider, MD  vitamin B-12 (CYANOCOBALAMIN) 1000 MCG tablet Take 1,000 mcg by mouth daily.   Yes Historical Provider, MD      VITAL SIGNS:  Blood pressure (!) 161/79, pulse 64, temperature 97.9 F (36.6 C), temperature source Oral, resp. rate 13, SpO2 95 %.  PHYSICAL EXAMINATION:  Physical Exam  GENERAL:  76 y.o.-year-old patient lying in the bed with no acute distress.  EYES: Pupils equal, round, reactive to light and accommodation. No scleral icterus. Extraocular muscles intact.  HEENT: Head atraumatic, normocephalic. Oropharynx and nasopharynx clear.  NECK:  Supple, no jugular venous distention. No thyroid enlargement, no tenderness.  LUNGS: Normal breath sounds bilaterally, no wheezing, rales,rhonchi or crepitation. No use of accessory muscles of respiration.  CARDIOVASCULAR: S1, S2 normal. No murmurs, rubs, or gallops.  ABDOMEN: Soft, LLQ tenderness,  nondistended. Bowel sounds present. No organomegaly or mass.  EXTREMITIES: No pedal edema, cyanosis, or clubbing.  NEUROLOGIC: Cranial nerves II through XII are intact. Muscle strength 5/5 in all extremities. Sensation intact. Gait not checked.  PSYCHIATRIC: The patient is alert and oriented x 3.  SKIN: No obvious rash, lesion, or ulcer.   LABORATORY PANEL:   CBC  Recent Labs Lab 08/18/16 0950  WBC 9.9  HGB 13.7  HCT 40.3  PLT 156   ------------------------------------------------------------------------------------------------------------------  Chemistries    Recent Labs Lab 08/18/16 0950  NA 138  K 3.9  CL 107  CO2 23  GLUCOSE 92  BUN 19  CREATININE 0.77  CALCIUM 9.0  AST 17  ALT 9*  ALKPHOS 76  BILITOT 0.8   ------------------------------------------------------------------------------------------------------------------  Cardiac Enzymes No results for input(s): TROPONINI in the last 168 hours. ------------------------------------------------------------------------------------------------------------------  RADIOLOGY:  Ct Abdomen Pelvis W Contrast  Result Date: 08/18/2016 CLINICAL DATA:  76 year old female with a history of abdominal and pelvic pain with rectal bleeding. History of diverticular disease EXAM: CT ABDOMEN AND PELVIS WITH CONTRAST TECHNIQUE: Multidetector CT imaging of the abdomen and pelvis was performed using the standard protocol following bolus administration of intravenous contrast. CONTRAST:  126mL ISOVUE-300 IOPAMIDOL (ISOVUE-300) INJECTION 61% COMPARISON:  12/31/2015 FINDINGS: Lower chest: Heart size unchanged. Calcifications of the right coronary artery. No pericardial fluid/ thickening. Respiratory motion somewhat limits evaluation of the lung bases. No pleural effusion. Hepatobiliary: Surgical changes of cholecystectomy. Unremarkable liver parenchyma. Dilated common bile duct measuring 11 mm, measuring slightly greater than from  comparison. Pancreas: Unremarkable. No pancreatic ductal dilatation or surrounding inflammatory changes. Spleen: Normal in size without focal abnormality. Adrenals/Urinary Tract: Unremarkable appearance of the bilateral adrenal glands. No evidence of hydronephrosis. No nephrolithiasis. Unremarkable course the bilateral ureters. Re- demonstration of subcentimeter hypodense lesions of the right kidney with the largest on the lateral cortex. This has features most compatible with benign cyst. Stomach/Bowel: Unremarkable appearance of stomach. Unremarkable small bowel. No abnormal distention of  the small bowel. Enteric contrast present within stomach, small bowel, extending to distal small bowel. Presence of the enteric contrast limits evaluation of the bowel wall enhancement. Circumferential wall thickening of the splenic flexure of the colon with associated inflammatory changes and small fluid of the adjacent mesenteric and peritoneal. Diverticular changes are present within colon. Normal appendix. Vascular/Lymphatic: Aortic atherosclerosis. No aneurysm. No dissection flap. Bilateral iliac system remain patent. Proximal femoral vasculature patent. Origin of the celiac artery patent. Origin of the superior mesenteric artery patent. Patency of the inferior mesenteric artery origin uncertain. Reproductive: Unremarkable appearance of the uterus. Re- demonstration of low-density cystic structure associated with the right adnexa. This measured 2.1 cm previously, now 2.5 cm. Cystic structure appears to have a thin wall with no internal soft tissue. Other: No abdominal wall hernia. Musculoskeletal: Posttraumatic changes of the pelvis. No acute fracture identified. Degenerative changes of the visualized spine. IMPRESSION: Acute colitis at the splenic flexure. Given the changes of diverticular disease, leading differential would be acute diverticulitis. Alternative etiology should also be considered, including infectious/inflammatory as well as ischemic given the location and background changes of vascular disease [place]). Aortic atherosclerosis. Simple appearing right adnexal cyst measures 2.5 cm. Consensus recommendations would be for annual surveillance in a patient of this age, and referral for gynecologic evaluation should be considered if not already initiated. These results were called by telephone at the time of interpretation on 08/18/2016 at 1:07 pm to Dr. Rudene Re , who verbally acknowledged these results. Electronically Signed   By: Corrie Mckusick D.O.   On: 08/18/2016 13:07       IMPRESSION AND PLAN:   Acute colitis and diverticulitis with rectal bleeding The patient will be admitted to medical floor. Hold aspirin, Full liquid diet, pain control follow-up hemoglobin every 6 hours, start Cipro and Flagyl, GI consult.  Hypertension. Continue hypertension medication, start hydralazine IV when necessary.  Tobacco abuse. Smoking cessation was counseled for 4 minutes, give nicotine patch.  All the records are reviewed and case discussed with ED provider. Management plans discussed with the patient, family and they are in agreement.  CODE STATUS: Full code  TOTAL TIME TAKING CARE OF THIS PATIENT: 53 minutes.    Demetrios Loll M.D on 08/18/2016 at 2:02 PM  Between 7am to 6pm - Pager - [phone]  After 6pm go to [email or web address] - Proofreader  [facility]  Office  [phone]  CC: Primary care physician; Marinda Elk, MD   Note: This dictation was prepared with Dragon dictation along with smaller phrase technology. Any transcriptional errors that result from this process are unintentional.

## 2016-08-19 ENCOUNTER — Encounter: Payer: Self-pay | Admitting: Radiology

## 2016-08-19 ENCOUNTER — Inpatient Hospital Stay: Payer: Medicare HMO

## 2016-08-19 LAB — BASIC METABOLIC PANEL
ANION GAP: 5 (ref 5–15)
BUN: 17 mg/dL (ref 6–20)
CALCIUM: 8.3 mg/dL — AB (ref 8.9–10.3)
CO2: 24 mmol/L (ref 22–32)
Chloride: 108 mmol/L (ref 101–111)
Creatinine, Ser: 0.91 mg/dL (ref 0.44–1.00)
GFR calc Af Amer: >60 mL/min (ref >60)
GLUCOSE: 77 mg/dL (ref 65–99)
Potassium: 3.7 mmol/L (ref 3.5–5.1)
Sodium: 137 mmol/L (ref 135–145)

## 2016-08-19 LAB — GASTROINTESTINAL PANEL BY PCR, STOOL (REPLACES STOOL CULTURE)

## 2016-08-19 LAB — HEMOGLOBIN: HEMOGLOBIN: 12.9 g/dL (ref 12.0–16.0)

## 2016-08-19 LAB — CBC
HEMATOCRIT: 37.5 % (ref 35.0–47.0)
HEMOGLOBIN: 12.7 g/dL (ref 12.0–16.0)
MCH: 32.1 pg (ref 26.0–34.0)
MCHC: 33.7 g/dL (ref 32.0–36.0)
MCV: 95.1 fL (ref 80.0–100.0)
Platelets: 143 10*3/uL — ABNORMAL LOW (ref 150–440)
RBC: 3.94 MIL/uL (ref 3.80–5.20)
RDW: 14.1 % (ref 11.5–14.5)
WBC: 8.1 10*3/uL (ref 3.6–11.0)

## 2016-08-19 LAB — CLOSTRIDIUM DIFFICILE BY PCR: Toxigenic C. Difficile by PCR: POSITIVE — AB

## 2016-08-19 LAB — C DIFFICILE QUICK SCREEN W PCR REFLEX
C DIFFICILE (CDIFF) TOXIN: NEGATIVE
C DIFFICLE (CDIFF) ANTIGEN: POSITIVE — AB

## 2016-08-19 LAB — TROPONIN I

## 2016-08-19 MED ORDER — IOPAMIDOL (ISOVUE-370) INJECTION 76%
80.0000 mL | Freq: Once | INTRAVENOUS | Status: AC | PRN
  Administered 2016-08-19: 80 mL via INTRAVENOUS

## 2016-08-19 MED ORDER — METRONIDAZOLE IN NACL 5-0.79 MG/ML-% IV SOLN
500.0000 mg | Freq: Three times a day (TID) | INTRAVENOUS | Status: DC
  Administered 2016-08-20 (×2): 500 mg via INTRAVENOUS
  Filled 2016-08-19 (×5): qty 100

## 2016-08-19 MED ORDER — SODIUM CHLORIDE 0.9 % IV SOLN
INTRAVENOUS | Status: DC
  Administered 2016-08-20 (×2): via INTRAVENOUS

## 2016-08-19 NOTE — Plan of Care (Signed)
Problem: Bowel/Gastric: Goal: Will not experience complications related to bowel motility Outcome: Progressing No bowel movement or active bleeding noted this shift.

## 2016-08-19 NOTE — Progress Notes (Signed)
[place] at [facility]                                                                                                                                                                                  Patient Demographics   Cheryl Hall, is a 76 y.o. female, DOB - [birth year], IHK:[id number]  Admit date - 08/18/2016   Admitting Physician Demetrios Loll, MD  Outpatient Primary MD for the patient is MCLAUGHLIN, MIRIAM K, MD   LOS - 1  Subjective: Pt feeling Better abdominal pain improved no nausea vomiting or diarrhea    Review of Systems:   CONSTITUTIONAL: No documented fever. No fatigue, weakness. No weight gain, no weight loss.  EYES: No blurry or double vision.  ENT: No tinnitus. No postnasal drip. No redness of the oropharynx.  RESPIRATORY: No cough, no wheeze, no hemoptysis. No dyspnea.  CARDIOVASCULAR: No chest pain. No orthopnea. No palpitations. No syncope.  GASTROINTESTINAL: No nausea, no vomiting or diarrhea. No abdominal pain. No melena or hematochezia.  GENITOURINARY: No dysuria or hematuria.  ENDOCRINE: No polyuria or nocturia. No heat or cold intolerance.  HEMATOLOGY: No anemia. No bruising. No bleeding.  INTEGUMENTARY: No rashes. No lesions.  MUSCULOSKELETAL: No arthritis. No swelling. No gout.  NEUROLOGIC: No numbness, tingling, or ataxia. No seizure-type activity.  PSYCHIATRIC: No anxiety. No insomnia. No ADD.    Vitals:   Vitals:   08/18/16 1507 08/18/16 1943 08/19/16 0536 08/19/16 1106  BP: 112/63 133/63 130/63 137/64  Pulse: 70 66 (!) 58 (!) 58  Resp: 17 16 16 18   Temp: 97.5 F (36.4 C) 98.2 F (36.8 C) 97.8 F (36.6 C) 98.4 F (36.9 C)  TempSrc: Oral Oral Oral Oral  SpO2: 99% 93% 93% 95%  Weight: 124 lb 4.8 oz (56.4 kg)     Height: 5\' 2"  (1.575 m)       Wt Readings from Last 3 Encounters:  08/18/16 124 lb 4.8 oz (56.4 kg)  02/16/16 121 lb 4 oz (55 kg)  12/31/15 126 lb (57.2 kg)     Intake/Output Summary (Last 24 hours) at  08/19/16 1413 Last data filed at 08/19/16 0657  Gross per 24 hour  Intake             2175 ml  Output              500 ml  Net             1675 ml    Physical Exam:   GENERAL: Pleasant-appearing in no apparent distress.  HEAD, EYES, EARS, NOSE AND THROAT: Atraumatic, normocephalic. Extraocular muscles are intact. Pupils equal and reactive to light. Sclerae anicteric.  No conjunctival injection. No oro-pharyngeal erythema.  NECK: Supple. There is no jugular venous distention. No bruits, no lymphadenopathy, no thyromegaly.  HEART: Regular rate and rhythm,. No murmurs, no rubs, no clicks.  LUNGS: Clear to auscultation bilaterally. No rales or rhonchi. No wheezes.  ABDOMEN: Soft, flat, nontender, nondistended. Has good bowel sounds. No hepatosplenomegaly appreciated.  EXTREMITIES: No evidence of any cyanosis, clubbing, or peripheral edema.  +2 pedal and radial pulses bilaterally.  NEUROLOGIC: The patient is alert, awake, and oriented x3 with no focal motor or sensory deficits appreciated bilaterally.  SKIN: Moist and warm with no rashes appreciated.  Psych: Not anxious, depressed LN: No inguinal LN enlargement    Antibiotics   Anti-infectives    Start     Dose/Rate Route Frequency Ordered Stop   08/18/16 1600  ciprofloxacin (CIPRO) IVPB 400 mg     400 mg 200 mL/hr over 60 Minutes Intravenous Every 12 hours 08/18/16 1510     08/18/16 1515  metroNIDAZOLE (FLAGYL) IVPB 500 mg     500 mg 100 mL/hr over 60 Minutes Intravenous Every 6 hours 08/18/16 1510     08/18/16 1315  metroNIDAZOLE (FLAGYL) IVPB 500 mg     500 mg 100 mL/hr over 60 Minutes Intravenous  Once 08/18/16 1307 08/18/16 1420   08/18/16 1315  amoxicillin-clavulanate (AUGMENTIN) 875-125 MG per tablet 1 tablet     1 tablet Oral  Once 08/18/16 1307 08/18/16 1325      Medications   Scheduled Meds: . carvedilol  6.25 mg Oral BID WC  . ciprofloxacin  400 mg Intravenous Q12H  . ferrous sulfate  325 mg Oral Q breakfast  .  lisinopril  40 mg Oral Daily  . metronidazole  500 mg Intravenous Q6H  . nicotine  7 mg Transdermal Daily  . pantoprazole  40 mg Oral Daily  . pneumococcal 23 valent vaccine  0.5 mL Intramuscular Tomorrow-1000  . risperiDONE  0.5 mg Oral BID  . simvastatin  40 mg Oral q1800  . vitamin B-12  1,000 mcg Oral Daily   Continuous Infusions: . sodium chloride 75 mL/hr at 08/18/16 1538  . sodium chloride     PRN Meds:.acetaminophen **OR** acetaminophen, albuterol, hydrALAZINE, HYDROcodone-acetaminophen, ketorolac, nitroGLYCERIN, ondansetron **OR** ondansetron (ZOFRAN) IV, polyvinyl alcohol   Data Review:   Micro Results No results found for this or any previous visit (from the past 240 hour(s)).  Radiology Reports Ct Abdomen Pelvis W Contrast  Result Date: 08/18/2016 CLINICAL DATA:  76 year old female with a history of abdominal and pelvic pain with rectal bleeding. History of diverticular disease EXAM: CT ABDOMEN AND PELVIS WITH CONTRAST TECHNIQUE: Multidetector CT imaging of the abdomen and pelvis was performed using the standard protocol following bolus administration of intravenous contrast. CONTRAST:  161mL ISOVUE-300 IOPAMIDOL (ISOVUE-300) INJECTION 61% COMPARISON:  12/31/2015 FINDINGS: Lower chest: Heart size unchanged. Calcifications of the right coronary artery. No pericardial fluid/ thickening. Respiratory motion somewhat limits evaluation of the lung bases. No pleural effusion. Hepatobiliary: Surgical changes of cholecystectomy. Unremarkable liver parenchyma. Dilated common bile duct measuring 11 mm, measuring slightly greater than from comparison. Pancreas: Unremarkable. No pancreatic ductal dilatation or surrounding inflammatory changes. Spleen: Normal in size without focal abnormality. Adrenals/Urinary Tract: Unremarkable appearance of the bilateral adrenal glands. No evidence of hydronephrosis. No nephrolithiasis. Unremarkable course the bilateral ureters. Re- demonstration of  subcentimeter hypodense lesions of the right kidney with the largest on the lateral cortex. This has features most compatible with benign cyst. Stomach/Bowel: Unremarkable appearance of stomach. Unremarkable small bowel.  No abnormal distention of the small bowel. Enteric contrast present within stomach, small bowel, extending to distal small bowel. Presence of the enteric contrast limits evaluation of the bowel wall enhancement. Circumferential wall thickening of the splenic flexure of the colon with associated inflammatory changes and small fluid of the adjacent mesenteric and peritoneal. Diverticular changes are present within colon. Normal appendix. Vascular/Lymphatic: Aortic atherosclerosis. No aneurysm. No dissection flap. Bilateral iliac system remain patent. Proximal femoral vasculature patent. Origin of the celiac artery patent. Origin of the superior mesenteric artery patent. Patency of the inferior mesenteric artery origin uncertain. Reproductive: Unremarkable appearance of the uterus. Re- demonstration of low-density cystic structure associated with the right adnexa. This measured 2.1 cm previously, now 2.5 cm. Cystic structure appears to have a thin wall with no internal soft tissue. Other: No abdominal wall hernia. Musculoskeletal: Posttraumatic changes of the pelvis. No acute fracture identified. Degenerative changes of the visualized spine. IMPRESSION: Acute colitis at the splenic flexure. Given the changes of diverticular disease, leading differential would be acute diverticulitis. Alternative etiology should also be considered, including infectious/inflammatory as well as ischemic given the location and background changes of vascular disease [place] Ped point). Aortic atherosclerosis. Simple appearing right adnexal cyst measures 2.5 cm. Consensus recommendations would be for annual surveillance in a patient of this age, and referral for gynecologic evaluation should be considered if not already  initiated. These results were called by telephone at the time of interpretation on 08/18/2016 at 1:07 pm to Dr. Rudene Re , who verbally acknowledged these results. Electronically Signed   By: Corrie Mckusick D.O.   On: 08/18/2016 13:07     CBC  Recent Labs Lab 08/18/16 0950 08/18/16 1533 08/18/16 2159 08/19/16 0508 08/19/16 0958  WBC 9.9  --   --  8.1  --   HGB 13.7 13.2 12.6 12.7 12.9  HCT 40.3  --   --  37.5  --   PLT 156  --   --  143*  --   MCV 93.8  --   --  95.1  --   MCH 31.8  --   --  32.1  --   MCHC 33.9  --   --  33.7  --   RDW 13.7  --   --  14.1  --   LYMPHSABS 1.6  --   --   --   --   MONOABS 0.4  --   --   --   --   EOSABS 0.0  --   --   --   --   BASOSABS 0.1  --   --   --   --     Chemistries   Recent Labs Lab 08/18/16 0950 08/19/16 0508  NA 138 137  K 3.9 3.7  CL 107 108  CO2 23 24  GLUCOSE 92 77  BUN 19 17  CREATININE 0.77 0.91  CALCIUM 9.0 8.3*  AST 17  --   ALT 9*  --   ALKPHOS 76  --   BILITOT 0.8  --    ------------------------------------------------------------------------------------------------------------------ estimated creatinine clearance is 43.5 mL/min (by C-G formula based on SCr of 0.91 mg/dL). ------------------------------------------------------------------------------------------------------------------ No results for input(s): HGBA1C in the last 72 hours. ------------------------------------------------------------------------------------------------------------------ No results for input(s): CHOL, HDL, LDLCALC, TRIG, CHOLHDL, LDLDIRECT in the last 72 hours. ------------------------------------------------------------------------------------------------------------------ No results for input(s): TSH, T4TOTAL, T3FREE, THYROIDAB in the last 72 hours.  Invalid input(s): FREET3 ------------------------------------------------------------------------------------------------------------------ No results for input(s):  VITAMINB12, FOLATE, FERRITIN, TIBC, IRON, RETICCTPCT in the  last 72 hours.  Coagulation profile  Recent Labs Lab 08/18/16 0950  INR 0.95    No results for input(s): DDIMER in the last 72 hours.  Cardiac Enzymes  Recent Labs Lab 08/18/16 1846 08/19/16 0508  TROPONINI <0.03 <0.03   ------------------------------------------------------------------------------------------------------------------ Invalid input(s): POCBNP    Assessment & Plan  Patient is a 76 year old admitted with abdominal pain 1. Acute colitis and diverticulitis with rectal bleeding Continue therapy with antibiotics. Appears to be ischemic in nature I will order a CTA of the abdomen and pelvis  2. Hypertension. Continue hypertension medication,continue hydralazine IV when necessary.  3. Tobacco abuse. Smoking cessation was done by admitting md      Code Status Orders        Start     Ordered   08/18/16 1927  Limited resuscitation (code)  Continuous    Question Answer Comment  In the event of cardiac or respiratory ARREST: Initiate Code Blue, Call Rapid Response Yes   In the event of cardiac or respiratory ARREST: Perform CPR Yes   In the event of cardiac or respiratory ARREST: Perform Intubation/Mechanical Ventilation No   In the event of cardiac or respiratory ARREST: Use NIPPV/BiPAp only if indicated Yes   In the event of cardiac or respiratory ARREST: Administer ACLS medications if indicated Yes   In the event of cardiac or respiratory ARREST: Perform Defibrillation or Cardioversion if indicated Yes      08/18/16 1927    Code Status History    Date Active Date Inactive Code Status Order ID Comments User Context   08/18/2016  3:10 PM 08/18/2016  7:27 PM Full Code [id number]  Demetrios Loll, MD Inpatient   10/21/2014  1:20 PM 10/21/2014  8:37 PM Full Code [id number]  Wellington Hampshire, MD Inpatient   10/20/2014  8:35 PM 10/21/2014  1:20 PM Full Code [id number]  Demetrios Loll, MD Inpatient            Consults  gi  DVT Prophylaxis  scd's  Lab Results  Component Value Date   PLT 143 (L) 08/19/2016     Time Spent in minutes  64min  Greater than 50% of time spent in care coordination and counseling patient regarding the condition and plan of care.   Dustin Flock M.D on 08/19/2016 at 2:13 PM  Between 7am to 6pm - Pager - [phone]  After 6pm go to [email or web address] - password [email or web address] [facility]   Office  [phone]

## 2016-08-19 NOTE — Plan of Care (Signed)
Problem: Safety: Goal: Ability to remain free from injury will improve Outcome: Progressing Pt is stand by assist to bathroom and is calling out for assistance with activity in the room.

## 2016-08-19 NOTE — Progress Notes (Addendum)
MD notified via text. Pt is positive for c-diff, isolation orders are entered, enteric and contact isolation is in effect. Patient was having diarrhea prior to admission. Pt only had 2 very small loose stools this shift.  I will continue to assess.

## 2016-08-20 DIAGNOSIS — K922 Gastrointestinal hemorrhage, unspecified: Secondary | ICD-10-CM

## 2016-08-20 DIAGNOSIS — K529 Noninfective gastroenteritis and colitis, unspecified: Secondary | ICD-10-CM

## 2016-08-20 LAB — BASIC METABOLIC PANEL
Anion gap: 7 (ref 5–15)
BUN: 12 mg/dL (ref 6–20)
CHLORIDE: 110 mmol/L (ref 101–111)
CO2: 23 mmol/L (ref 22–32)
CREATININE: 0.93 mg/dL (ref 0.44–1.00)
Calcium: 8.4 mg/dL — ABNORMAL LOW (ref 8.9–10.3)
GFR calc Af Amer: >60 mL/min (ref >60)
GFR calc non Af Amer: 60 mL/min — ABNORMAL LOW (ref >60)
GLUCOSE: 77 mg/dL (ref 65–99)
POTASSIUM: 3.6 mmol/L (ref 3.5–5.1)
SODIUM: 140 mmol/L (ref 135–145)

## 2016-08-20 LAB — CBC
HCT: 37.9 % (ref 35.0–47.0)
HEMOGLOBIN: 12.6 g/dL (ref 12.0–16.0)
MCH: 31.7 pg (ref 26.0–34.0)
MCHC: 33.2 g/dL (ref 32.0–36.0)
MCV: 95.4 fL (ref 80.0–100.0)
Platelets: 137 10*3/uL — ABNORMAL LOW (ref 150–440)
RBC: 3.98 MIL/uL (ref 3.80–5.20)
RDW: 13.9 % (ref 11.5–14.5)
WBC: 7.4 10*3/uL (ref 3.6–11.0)

## 2016-08-20 MED ORDER — VANCOMYCIN 50 MG/ML ORAL SOLUTION
125.0000 mg | Freq: Four times a day (QID) | ORAL | Status: DC
  Administered 2016-08-20 – 2016-08-21 (×4): 125 mg via ORAL
  Filled 2016-08-20 (×7): qty 2.5

## 2016-08-20 NOTE — Progress Notes (Signed)
Cheryl Lame, MD [facility]   [address] [place], [place] [address] Phone: [phone] Fax : [phone]   Subjective: This patient came with abdominal pain and diarrhea. The patient was found to have [name] toxin negative with [name] antigen positive.  She reports that she is feeling much better than when she came in. She reports that the rectal bleeding has also improved.  The patient is now on oral vancomycin.     Objective: Vital signs in last 24 hours: Vitals:   08/20/16 0435 08/20/16 0800 08/20/16 1157 08/20/16 2027  BP: (!) 158/70 (!) 162/73 (!) 172/90 (!) 157/67  Pulse: (!) 58 (!) 59 65 62  Resp: 16 16 14 16   Temp: 98.2 F (36.8 C)  98.3 F (36.8 C) 98.6 F (37 C)  TempSrc: Oral  Oral Oral  SpO2: 95% 93% 92% 93%  Weight:      Height:       Weight change:   Intake/Output Summary (Last 24 hours) at 08/20/16 2205 Last data filed at 08/20/16 6546  Gross per 24 hour  Intake          1673.34 ml  Output              500 ml  Net          1173.34 ml     Exam: Heart:: Regular rate and rhythm, S1S2 present or without murmur or extra heart sounds Lungs: normal and clear to auscultation and percussion Abdomen: soft, nontender, normal bowel sounds   Lab Results: @LABTEST2 @ Micro Results: Recent Results (from the past 240 hour(s))  Gastrointestinal Panel by PCR , Stool     Status: None   Collection Time: 08/19/16 12:57 PM  Result Value Ref Range Status   Campylobacter species NOT DETECTED NOT DETECTED Final   Plesimonas shigelloides NOT DETECTED NOT DETECTED Final   Salmonella species NOT DETECTED NOT DETECTED Final   Yersinia enterocolitica NOT DETECTED NOT DETECTED Final   Vibrio species NOT DETECTED NOT DETECTED Final   Vibrio cholerae NOT DETECTED NOT DETECTED Final   Enteroaggregative E coli (EAEC) NOT DETECTED NOT DETECTED Final   Enteropathogenic E coli (EPEC) NOT DETECTED NOT DETECTED Final   Enterotoxigenic E coli (ETEC) NOT DETECTED  NOT DETECTED Final   Shiga like toxin producing E coli (STEC) NOT DETECTED NOT DETECTED Final   Shigella/Enteroinvasive E coli (EIEC) NOT DETECTED NOT DETECTED Final   Cryptosporidium NOT DETECTED NOT DETECTED Final   Cyclospora cayetanensis NOT DETECTED NOT DETECTED Final   Entamoeba histolytica NOT DETECTED NOT DETECTED Final   Giardia lamblia NOT DETECTED NOT DETECTED Final   Adenovirus F40/41 NOT DETECTED NOT DETECTED Final   Astrovirus NOT DETECTED NOT DETECTED Final   Norovirus GI/GII NOT DETECTED NOT DETECTED Final   Rotavirus A NOT DETECTED NOT DETECTED Final   Sapovirus (I, II, IV, and V) NOT DETECTED NOT DETECTED Final  C difficile quick scan w PCR reflex     Status: Abnormal   Collection Time: 08/19/16 12:57 PM  Result Value Ref Range Status   C Diff antigen POSITIVE (A) NEGATIVE Final   C Diff toxin NEGATIVE NEGATIVE Final   C Diff interpretation Results are indeterminate. See PCR results.  Final  Clostridium Difficile by PCR     Status: Abnormal   Collection Time: 08/19/16 12:57 PM  Result Value Ref Range Status   Toxigenic C Difficile by pcr POSITIVE (A) NEGATIVE Final    Comment: Positive for toxigenic [name] with  little to no toxin production. Only treat if clinical presentation suggests symptomatic illness.   Studies/Results: Ct Angio Abdomen W &/or Wo Contrast  Result Date: 08/19/2016 CLINICAL DATA:  Generalized abdominal pain. EXAM: CT ANGIOGRAPHY ABDOMEN TECHNIQUE: Multidetector CT imaging of the abdomen was performed using the standard protocol during bolus administration of intravenous contrast. Multiplanar reconstructed images and MIPs were obtained and reviewed to evaluate the vascular anatomy. CONTRAST:  80 mL of Isovue 370 intravenously. COMPARISON:  CT scan of August 18, 2016. FINDINGS: VASCULAR Aorta: Atherosclerosis is noted without aneurysm formation. Small focal dissection is noted in the infrarenal abdominal aorta. Celiac: Moderate stenosis is noted at  the origin. SMA: Patent without evidence of aneurysm, dissection, vasculitis or significant stenosis. Renals: 2 left renal arteries are noted which are widely patent. Moderate focal stenosis is noted at the origin the right renal artery. IMA: Patent without evidence of aneurysm, dissection, vasculitis or significant stenosis. Inflow: Iliac arteries are not included in the field-of-view. Veins: No obvious venous abnormality within the limitations of this arterial phase study. Review of the MIP images confirms the above findings. NON-VASCULAR Lower chest: Left posterior basilar atelectasis is noted. Hepatobiliary: No focal liver abnormality is seen. Status post cholecystectomy. No biliary dilatation. Pancreas: Unremarkable. No pancreatic ductal dilatation or surrounding inflammatory changes. Spleen: Normal in size without focal abnormality. Adrenals/Urinary Tract: Adrenal glands and kidneys are unremarkable. Stomach/Bowel: Wall thickening of visualized proximal descending colon is noted suggesting colitis. Lymphatic: No significant adenopathy is noted. Other: No abdominal wall hernia or abnormality. No abdominopelvic ascites. Musculoskeletal: No acute or significant osseous findings. 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 NON-VASCULAR Left posterior basilar subsegmental atelectasis. Wall thickening of visualized proximal descending colon suggesting colitis. Electronically Signed   By: Marijo Conception, M.D.   On: 08/19/2016 16:19   Medications: I have reviewed the patient's current medications. Scheduled Meds: . carvedilol  6.25 mg Oral BID WC  . ferrous sulfate  325 mg Oral Q breakfast  . lisinopril  40 mg Oral Daily  . nicotine  7 mg Transdermal Daily  . pantoprazole  40 mg Oral Daily  . pneumococcal 23 valent  vaccine  0.5 mL Intramuscular Tomorrow-1000  . risperiDONE  0.5 mg Oral BID  . simvastatin  40 mg Oral q1800  . vancomycin  125 mg Oral Q6H  . vitamin B-12  1,000 mcg Oral Daily   Continuous Infusions: . sodium chloride 100 mL/hr at 08/20/16 1933   PRN Meds:.acetaminophen **OR** acetaminophen, albuterol, hydrALAZINE, HYDROcodone-acetaminophen, ketorolac, nitroGLYCERIN, ondansetron **OR** ondansetron (ZOFRAN) IV, polyvinyl alcohol   Assessment: Active Problems:   Acute colitis    Plan: The patient's symptoms are improved and she is feeling much better. Nothing further to do from a GI point of view. I will sign off.  Please call if any further GI concerns or questions.  We would like to thank you for the opportunity to participate in the care of Cheryl Hall.   LOS: 2 days   Cheryl Hall 08/20/2016, 10:05 PM

## 2016-08-20 NOTE — Care Management Important Message (Signed)
Important Message  Patient Details  Name: Cheryl Hall MRN: [id number] Date of Birth: 05/25/1941   Medicare Important Message Given:  Yes  signed IM printed from Epic and given to patient.   Katrina Stack, RN 08/20/2016, 12:13 PM

## 2016-08-20 NOTE — Care Management (Signed)
spoke with patient Cheryl Hall pharmacy customer service.  If patient discharged home on oral vancomycin- whether it be tabs or liquid approx one week's worth of medication would be 211.00.  Patient has not met her 195 dollar deductible and it is a Tier 5 drug which makes it more expensive

## 2016-08-20 NOTE — Progress Notes (Signed)
[place] at [facility]                                                                                                                                                                                  Patient Demographics   Cheryl Hall, is a 76 y.o. female, DOB - 03/31/1941, XBW:[id number]  Admit date - 08/18/2016   Admitting Physician Demetrios Loll, MD  Outpatient Primary MD for the patient is [facility], MIRIAM K, MD   LOS - 2  Subjective: Still having some abdominal pain and diarrhea.Stool positive for [name]    Review of Systems:   CONSTITUTIONAL: No documented fever. No fatigue, weakness. No weight gain, no weight loss.  EYES: No blurry or double vision.  ENT: No tinnitus. No postnasal drip. No redness of the oropharynx.  RESPIRATORY: No cough, no wheeze, no hemoptysis. No dyspnea.  CARDIOVASCULAR: No chest pain. No orthopnea. No palpitations. No syncope.  GASTROINTESTINAL: No nausea, no vomiting positive diarrhea positive abdominal pain. No melena or hematochezia.  GENITOURINARY: No dysuria or hematuria.  ENDOCRINE: No polyuria or nocturia. No heat or cold intolerance.  HEMATOLOGY: No anemia. No bruising. No bleeding.  INTEGUMENTARY: No rashes. No lesions.  MUSCULOSKELETAL: No arthritis. No swelling. No gout.  NEUROLOGIC: No numbness, tingling, or ataxia. No seizure-type activity.  PSYCHIATRIC: No anxiety. No insomnia. No ADD.    Vitals:   Vitals:   08/19/16 1943 08/20/16 0435 08/20/16 0800 08/20/16 1157  BP: (!) 145/71 (!) 158/70 (!) 162/73 (!) 172/90  Pulse: 66 (!) 58 (!) 59 65  Resp:  16 16 14   Temp: 98.5 F (36.9 C) 98.2 F (36.8 C)  98.3 F (36.8 C)  TempSrc: Oral Oral  Oral  SpO2: 94% 95% 93% 92%  Weight:      Height:        Wt Readings from Last 3 Encounters:  08/18/16 124 lb 4.8 oz (56.4 kg)  02/16/16 121 lb 4 oz (55 kg)  12/31/15 126 lb (57.2 kg)     Intake/Output Summary (Last 24 hours) at 08/20/16 1406 Last data  filed at 08/20/16 1638  Gross per 24 hour  Intake          1793.34 ml  Output              700 ml  Net          1093.34 ml    Physical Exam:   GENERAL: Pleasant-appearing in no apparent distress.  HEAD, EYES, EARS, NOSE AND THROAT: Atraumatic, normocephalic. Extraocular muscles are intact. Pupils equal and reactive to light. Sclerae anicteric. No conjunctival injection. No oro-pharyngeal erythema.  NECK: Supple. There is no jugular  venous distention. No bruits, no lymphadenopathy, no thyromegaly.  HEART: Regular rate and rhythm,. No murmurs, no rubs, no clicks.  LUNGS: Clear to auscultation bilaterally. No rales or rhonchi. No wheezes.  ABDOMEN: Soft, flat, nontender, nondistended. Has good bowel sounds. No hepatosplenomegaly appreciated.  EXTREMITIES: No evidence of any cyanosis, clubbing, or peripheral edema.  +2 pedal and radial pulses bilaterally.  NEUROLOGIC: The patient is alert, awake, and oriented x3 with no focal motor or sensory deficits appreciated bilaterally.  SKIN: Moist and warm with no rashes appreciated.  Psych: Not anxious, depressed LN: No inguinal LN enlargement    Antibiotics   Anti-infectives    Start     Dose/Rate Route Frequency Ordered Stop   08/20/16 1215  vancomycin (VANCOCIN) 50 mg/mL oral solution 125 mg     125 mg Oral Every 6 hours 08/20/16 1206     08/19/16 1700  metroNIDAZOLE (FLAGYL) IVPB 500 mg  Status:  Discontinued     500 mg 100 mL/hr over 60 Minutes Intravenous Every 8 hours 08/19/16 1624 08/20/16 1206   08/18/16 1600  ciprofloxacin (CIPRO) IVPB 400 mg  Status:  Discontinued     400 mg 200 mL/hr over 60 Minutes Intravenous Every 12 hours 08/18/16 1510 08/19/16 1623   08/18/16 1515  metroNIDAZOLE (FLAGYL) IVPB 500 mg  Status:  Discontinued     500 mg 100 mL/hr over 60 Minutes Intravenous Every 6 hours 08/18/16 1510 08/19/16 1623   08/18/16 1315  metroNIDAZOLE (FLAGYL) IVPB 500 mg     500 mg 100 mL/hr over 60 Minutes Intravenous  Once  08/18/16 1307 08/18/16 1420   08/18/16 1315  amoxicillin-clavulanate (AUGMENTIN) 875-125 MG per tablet 1 tablet     1 tablet Oral  Once 08/18/16 1307 08/18/16 1325      Medications   Scheduled Meds: . carvedilol  6.25 mg Oral BID WC  . ferrous sulfate  325 mg Oral Q breakfast  . lisinopril  40 mg Oral Daily  . nicotine  7 mg Transdermal Daily  . pantoprazole  40 mg Oral Daily  . pneumococcal 23 valent vaccine  0.5 mL Intramuscular Tomorrow-1000  . risperiDONE  0.5 mg Oral BID  . simvastatin  40 mg Oral q1800  . vancomycin  125 mg Oral Q6H  . vitamin B-12  1,000 mcg Oral Daily   Continuous Infusions: . sodium chloride 100 mL/hr at 08/20/16 1610   PRN Meds:.acetaminophen **OR** acetaminophen, albuterol, hydrALAZINE, HYDROcodone-acetaminophen, ketorolac, nitroGLYCERIN, ondansetron **OR** ondansetron (ZOFRAN) IV, polyvinyl alcohol   Data Review:   Micro Results Recent Results (from the past 240 hour(s))  Gastrointestinal Panel by PCR , Stool     Status: None   Collection Time: 08/19/16 12:57 PM  Result Value Ref Range Status   Campylobacter species NOT DETECTED NOT DETECTED Final   Plesimonas shigelloides NOT DETECTED NOT DETECTED Final   Salmonella species NOT DETECTED NOT DETECTED Final   Yersinia enterocolitica NOT DETECTED NOT DETECTED Final   Vibrio species NOT DETECTED NOT DETECTED Final   Vibrio cholerae NOT DETECTED NOT DETECTED Final   Enteroaggregative E coli (EAEC) NOT DETECTED NOT DETECTED Final   Enteropathogenic E coli (EPEC) NOT DETECTED NOT DETECTED Final   Enterotoxigenic E coli (ETEC) NOT DETECTED NOT DETECTED Final   Shiga like toxin producing E coli (STEC) NOT DETECTED NOT DETECTED Final   Shigella/Enteroinvasive E coli (EIEC) NOT DETECTED NOT DETECTED Final   Cryptosporidium NOT DETECTED NOT DETECTED Final   Cyclospora cayetanensis NOT DETECTED NOT DETECTED Final  Entamoeba histolytica NOT DETECTED NOT DETECTED Final   Giardia lamblia NOT DETECTED NOT  DETECTED Final   Adenovirus F40/41 NOT DETECTED NOT DETECTED Final   Astrovirus NOT DETECTED NOT DETECTED Final   Norovirus GI/GII NOT DETECTED NOT DETECTED Final   Rotavirus A NOT DETECTED NOT DETECTED Final   Sapovirus (I, II, IV, and V) NOT DETECTED NOT DETECTED Final  C difficile quick scan w PCR reflex     Status: Abnormal   Collection Time: 08/19/16 12:57 PM  Result Value Ref Range Status   C Diff antigen POSITIVE (A) NEGATIVE Final   C Diff toxin NEGATIVE NEGATIVE Final   C Diff interpretation Results are indeterminate. See PCR results.  Final  Clostridium Difficile by PCR     Status: Abnormal   Collection Time: 08/19/16 12:57 PM  Result Value Ref Range Status   Toxigenic C Difficile by pcr POSITIVE (A) NEGATIVE Final    Comment: Positive for toxigenic [name] with little to no toxin production. Only treat if clinical presentation suggests symptomatic illness.    Radiology Reports Ct Angio Abdomen W &/or Wo Contrast  Result Date: 08/19/2016 CLINICAL DATA:  Generalized abdominal pain. EXAM: CT ANGIOGRAPHY ABDOMEN TECHNIQUE: Multidetector CT imaging of the abdomen was performed using the standard protocol during bolus administration of intravenous contrast. Multiplanar reconstructed images and MIPs were obtained and reviewed to evaluate the vascular anatomy. CONTRAST:  80 mL of Isovue 370 intravenously. COMPARISON:  CT scan of August 18, 2016. FINDINGS: VASCULAR Aorta: Atherosclerosis is noted without aneurysm formation. Small focal dissection is noted in the infrarenal abdominal aorta. Celiac: Moderate stenosis is noted at the origin. SMA: Patent without evidence of aneurysm, dissection, vasculitis or significant stenosis. Renals: 2 left renal arteries are noted which are widely patent. Moderate focal stenosis is noted at the origin the right renal artery. IMA: Patent without evidence of aneurysm, dissection, vasculitis or significant stenosis. Inflow: Iliac arteries are not included  in the field-of-view. Veins: No obvious venous abnormality within the limitations of this arterial phase study. Review of the MIP images confirms the above findings. NON-VASCULAR Lower chest: Left posterior basilar atelectasis is noted. Hepatobiliary: No focal liver abnormality is seen. Status post cholecystectomy. No biliary dilatation. Pancreas: Unremarkable. No pancreatic ductal dilatation or surrounding inflammatory changes. Spleen: Normal in size without focal abnormality. Adrenals/Urinary Tract: Adrenal glands and kidneys are unremarkable. Stomach/Bowel: Wall thickening of visualized proximal descending colon is noted suggesting colitis. Lymphatic: No significant adenopathy is noted. Other: No abdominal wall hernia or abnormality. No abdominopelvic ascites. Musculoskeletal: No acute or significant osseous findings. 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 NON-VASCULAR Left posterior basilar subsegmental atelectasis. Wall thickening of visualized proximal descending colon suggesting colitis. Electronically Signed   By: Marijo Conception, M.D.   On: 08/19/2016 16:19   Ct Abdomen Pelvis W Contrast  Result Date: 08/18/2016 CLINICAL DATA:  76 year old female with a history of abdominal and pelvic pain with rectal bleeding. History of diverticular disease EXAM: CT ABDOMEN AND PELVIS WITH CONTRAST TECHNIQUE: Multidetector CT imaging of the abdomen and pelvis was performed using the standard protocol following bolus administration of intravenous contrast. CONTRAST:  115mL ISOVUE-300 IOPAMIDOL (ISOVUE-300) INJECTION 61% COMPARISON:  12/31/2015 FINDINGS: Lower chest: Heart size unchanged. Calcifications of the right coronary artery. No pericardial fluid/ thickening. Respiratory motion somewhat limits evaluation of the  lung bases. No  pleural effusion. Hepatobiliary: Surgical changes of cholecystectomy. Unremarkable liver parenchyma. Dilated common bile duct measuring 11 mm, measuring slightly greater than from comparison. Pancreas: Unremarkable. No pancreatic ductal dilatation or surrounding inflammatory changes. Spleen: Normal in size without focal abnormality. Adrenals/Urinary Tract: Unremarkable appearance of the bilateral adrenal glands. No evidence of hydronephrosis. No nephrolithiasis. Unremarkable course the bilateral ureters. Re- demonstration of subcentimeter hypodense lesions of the right kidney with the largest on the lateral cortex. This has features most compatible with benign cyst. Stomach/Bowel: Unremarkable appearance of stomach. Unremarkable small bowel. No abnormal distention of the small bowel. Enteric contrast present within stomach, small bowel, extending to distal small bowel. Presence of the enteric contrast limits evaluation of the bowel wall enhancement. Circumferential wall thickening of the splenic flexure of the colon with associated inflammatory changes and small fluid of the adjacent mesenteric and peritoneal. Diverticular changes are present within colon. Normal appendix. Vascular/Lymphatic: Aortic atherosclerosis. No aneurysm. No dissection flap. Bilateral iliac system remain patent. Proximal femoral vasculature patent. Origin of the celiac artery patent. Origin of the superior mesenteric artery patent. Patency of the inferior mesenteric artery origin uncertain. Reproductive: Unremarkable appearance of the uterus. Re- demonstration of low-density cystic structure associated with the right adnexa. This measured 2.1 cm previously, now 2.5 cm. Cystic structure appears to have a thin wall with no internal soft tissue. Other: No abdominal wall hernia. Musculoskeletal: Posttraumatic changes of the pelvis. No acute fracture identified. Degenerative changes of the visualized spine. IMPRESSION: Acute colitis  at the splenic flexure. Given the changes of diverticular disease, leading differential would be acute diverticulitis. Alternative etiology should also be considered, including infectious/inflammatory as well as ischemic given the location and background changes of vascular disease Arbutus Ped point). Aortic atherosclerosis. Simple appearing right adnexal cyst measures 2.5 cm. Consensus recommendations would be for annual surveillance in a patient of this age, and referral for gynecologic evaluation should be considered if not already initiated. These results were called by telephone at the time of interpretation on 08/18/2016 at 1:07 pm to Dr. Rudene Re , who verbally acknowledged these results. Electronically Signed   By: Corrie Mckusick D.O.   On: 08/18/2016 13:07     CBC  Recent Labs Lab 08/18/16 0950 08/18/16 1533 08/18/16 2159 08/19/16 0508 08/19/16 0958 08/20/16 0422  WBC 9.9  --   --  8.1  --  7.4  HGB 13.7 13.2 12.6 12.7 12.9 12.6  HCT 40.3  --   --  37.5  --  37.9  PLT 156  --   --  143*  --  137*  MCV 93.8  --   --  95.1  --  95.4  MCH 31.8  --   --  32.1  --  31.7  MCHC 33.9  --   --  33.7  --  33.2  RDW 13.7  --   --  14.1  --  13.9  LYMPHSABS 1.6  --   --   --   --   --   MONOABS 0.4  --   --   --   --   --   EOSABS 0.0  --   --   --   --   --   BASOSABS 0.1  --   --   --   --   --     Chemistries   Recent Labs Lab 08/18/16 0950 08/19/16 0508 08/20/16 0422  NA 138 137 140  K 3.9 3.7 3.6  CL 107 108 110  CO2 23 24 23   GLUCOSE 92 77 77  BUN 19 17 12   CREATININE 0.77 0.91 0.93  CALCIUM 9.0 8.3* 8.4*  AST 17  --   --   ALT 9*  --   --   ALKPHOS 76  --   --   BILITOT 0.8  --   --    ------------------------------------------------------------------------------------------------------------------ estimated creatinine clearance is 42.6 mL/min (by C-G formula based on SCr of 0.93  mg/dL). ------------------------------------------------------------------------------------------------------------------ No results for input(s): HGBA1C in the last 72 hours. ------------------------------------------------------------------------------------------------------------------ No results for input(s): CHOL, HDL, LDLCALC, TRIG, CHOLHDL, LDLDIRECT in the last 72 hours. ------------------------------------------------------------------------------------------------------------------ No results for input(s): TSH, T4TOTAL, T3FREE, THYROIDAB in the last 72 hours.  Invalid input(s): FREET3 ------------------------------------------------------------------------------------------------------------------ No results for input(s): VITAMINB12, FOLATE, FERRITIN, TIBC, IRON, RETICCTPCT in the last 72 hours.  Coagulation profile  Recent Labs Lab 08/18/16 0950  INR 0.95    No results for input(s): DDIMER in the last 72 hours.  Cardiac Enzymes  Recent Labs Lab 08/18/16 1846 08/19/16 0508  TROPONINI <0.03 <0.03   ------------------------------------------------------------------------------------------------------------------ Invalid input(s): POCBNP    Assessment & Plan  Patient is a 76 year old admitted with abdominal pain 1. Acute colitis and diverticulitis with rectal bleeding We'll discontinue Flagyl start on oral vancomycin   2. Hypertension. Continue hypertension medication,continue hydralazine IV when necessary.  3. Tobacco abuse. Smoking cessation was done by admitting md  4. 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 I will ask vascular to see      Code Status Orders        Start     Ordered   08/18/16 1927  Limited resuscitation (code)  Continuous    Question Answer Comment  In the event of cardiac or respiratory ARREST: Initiate Code Blue, Call Rapid Response Yes   In the event of cardiac or respiratory ARREST:  Perform CPR Yes   In the event of cardiac or respiratory ARREST: Perform Intubation/Mechanical Ventilation No   In the event of cardiac or respiratory ARREST: Use NIPPV/BiPAp only if indicated Yes   In the event of cardiac or respiratory ARREST: Administer ACLS medications if indicated Yes   In the event of cardiac or respiratory ARREST: Perform Defibrillation or Cardioversion if indicated Yes      08/18/16 1927    Code Status History    Date Active Date Inactive Code Status Order ID Comments User Context   08/18/2016  3:10 PM 08/18/2016  7:27 PM Full Code [id number]  Demetrios Loll, MD Inpatient   10/21/2014  1:20 PM 10/21/2014  8:37 PM Full Code [id number]  Wellington Hampshire, MD Inpatient   10/20/2014  8:35 PM 10/21/2014  1:20 PM Full Code [id number]  Demetrios Loll, MD Inpatient           Consults  gi  DVT Prophylaxis  scd's  Lab Results  Component Value Date   PLT 137 (L) 08/20/2016     Time Spent in minutes  28min  Greater than 50% of time spent in care coordination and counseling patient regarding the condition and plan of care.   Dustin Flock M.D on 08/20/2016 at 2:06 PM  Between 7am to 6pm - Pager - [phone]  After 6pm go to [email or web address] - password [email or web address] [facility]   Office  [phone]

## 2016-08-20 NOTE — Plan of Care (Signed)
Problem: Bowel/Gastric: Goal: Will not experience complications related to bowel motility Outcome: Progressing Patient experienced only one episode of diarrhea this morning. States she feels better.

## 2016-08-20 NOTE — Care Management (Signed)
Independent in all adls, denies issues accessing medical care, obtaining medications or with transportation.  Current with her PCP.  Started on oral vancomycin for C Diff. Will check with her insurance regarding copay.

## 2016-08-20 NOTE — Plan of Care (Signed)
Problem: Bowel/Gastric: Goal: Will not experience complications related to bowel motility Outcome: Progressing No further stools/bleeding noted this shift.  Enteric precautions maintained.

## 2016-08-21 DIAGNOSIS — Z23 Encounter for immunization: Secondary | ICD-10-CM | POA: Diagnosis not present

## 2016-08-21 LAB — BASIC METABOLIC PANEL
ANION GAP: 6 (ref 5–15)
BUN: 10 mg/dL (ref 6–20)
CALCIUM: 8.3 mg/dL — AB (ref 8.9–10.3)
CO2: 23 mmol/L (ref 22–32)
Chloride: 112 mmol/L — ABNORMAL HIGH (ref 101–111)
Creatinine, Ser: 0.79 mg/dL (ref 0.44–1.00)
GFR calc Af Amer: >60 mL/min (ref >60)
GLUCOSE: 84 mg/dL (ref 65–99)
POTASSIUM: 3.6 mmol/L (ref 3.5–5.1)
SODIUM: 141 mmol/L (ref 135–145)

## 2016-08-21 LAB — CBC
HCT: 34.4 % — ABNORMAL LOW (ref 35.0–47.0)
Hemoglobin: 11.8 g/dL — ABNORMAL LOW (ref 12.0–16.0)
MCH: 32.2 pg (ref 26.0–34.0)
MCHC: 34.2 g/dL (ref 32.0–36.0)
MCV: 94.4 fL (ref 80.0–100.0)
PLATELETS: 136 10*3/uL — AB (ref 150–440)
RBC: 3.64 MIL/uL — AB (ref 3.80–5.20)
RDW: 13.5 % (ref 11.5–14.5)
WBC: 6 10*3/uL (ref 3.6–11.0)

## 2016-08-21 MED ORDER — NICOTINE 10 MG IN INHA: 1.0000 | RESPIRATORY_TRACT | 0 refills | Status: DC | PRN

## 2016-08-21 MED ORDER — VANCOMYCIN HCL 125 MG PO CAPS: 125.0000 mg | ORAL_CAPSULE | Freq: Four times a day (QID) | ORAL | 0 refills | Status: AC

## 2016-08-21 MED ORDER — VANCOMYCIN 50 MG/ML ORAL SOLUTION: 125.0000 mg | Freq: Four times a day (QID) | ORAL | 0 refills | Status: DC

## 2016-08-21 NOTE — Discharge Summary (Signed)
[place], 76 y.o., DOB [birth year], MRN [id number]. Admission date: 08/18/2016 Discharge Date 08/21/2016 Primary MD Marinda Elk, MD Admitting Physician Demetrios Loll, MD  Admission Diagnosis  Colitis [K52.9] Lower GI bleed [K92.2]  Discharge Diagnosis   Active Problems:   Acute  c diff colitis   Lower GI bleed due to c.diff colitis   Small aortic dissection  Nicotine addiction  essential hypertention   [place]  is a 76 y.o. female with a known history of Diverticulitis, CKD, CAD, hypertension and hyperlipidemia. The patient started to have a left lower abdominal pain since yesterday, which is a sharp, intermittent without radiation, associated with nausea and vomiting. Patient also complains of several times of diarrhea with bloody stool. But she denies any fever or chills. CAT scan of abdomen showed acute colitis and diverticulitis. Patient was seen by GI and had a stool study ordered which showed [name] positive. Patient was started initially on Flagyl subsequently switched to vancomycin. She was also noticed to have small aortic dissection of the abdominal aorta. She'll need outpatient follow up her blood pressure is currently stable. She was counseled regarding nicotine smoking cessation.           Consults  GI  Significant Tests:  See full reports for all details     Ct Angio Abdomen W &/or Wo Contrast  Result Date: 08/19/2016 CLINICAL DATA:  Generalized abdominal pain. EXAM: CT ANGIOGRAPHY ABDOMEN TECHNIQUE: Multidetector CT imaging of the abdomen was performed using the standard protocol during bolus administration of intravenous contrast. Multiplanar reconstructed images and MIPs were obtained and reviewed to evaluate the vascular anatomy. CONTRAST:  80 mL of Isovue 370 intravenously. COMPARISON:  CT scan of August 18, 2016. FINDINGS: VASCULAR Aorta: Atherosclerosis is  noted without aneurysm formation. Small focal dissection is noted in the infrarenal abdominal aorta. Celiac: Moderate stenosis is noted at the origin. SMA: Patent without evidence of aneurysm, dissection, vasculitis or significant stenosis. Renals: 2 left renal arteries are noted which are widely patent. Moderate focal stenosis is noted at the origin the right renal artery. IMA: Patent without evidence of aneurysm, dissection, vasculitis or significant stenosis. Inflow: Iliac arteries are not included in the field-of-view. Veins: No obvious venous abnormality within the limitations of this arterial phase study. Review of the MIP images confirms the above findings. NON-VASCULAR Lower chest: Left posterior basilar atelectasis is noted. Hepatobiliary: No focal liver abnormality is seen. Status post cholecystectomy. No biliary dilatation. Pancreas: Unremarkable. No pancreatic ductal dilatation or surrounding inflammatory changes. Spleen: Normal in size without focal abnormality. Adrenals/Urinary Tract: Adrenal glands and kidneys are unremarkable. Stomach/Bowel: Wall thickening of visualized proximal descending colon is noted suggesting colitis. Lymphatic: No significant adenopathy is noted. Other: No abdominal wall hernia or abnormality. No abdominopelvic ascites. Musculoskeletal: No acute or significant osseous findings. 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 NON-VASCULAR Left posterior basilar subsegmental atelectasis. Wall thickening of visualized proximal descending colon suggesting colitis. Electronically Signed   By: Marijo Conception, M.D.   On: 08/19/2016 16:19   Ct Abdomen Pelvis W Contrast  Result Date: 08/18/2016 CLINICAL DATA:  76 year old female with a history of abdominal and pelvic pain with rectal  bleeding. History of diverticular disease EXAM: CT ABDOMEN AND PELVIS  WITH CONTRAST TECHNIQUE: Multidetector CT imaging of the abdomen and pelvis was performed using the standard protocol following bolus administration of intravenous contrast. CONTRAST:  17mL ISOVUE-300 IOPAMIDOL (ISOVUE-300) INJECTION 61% COMPARISON:  12/31/2015 FINDINGS: Lower chest: Heart size unchanged. Calcifications of the right coronary artery. No pericardial fluid/ thickening. Respiratory motion somewhat limits evaluation of the lung bases. No pleural effusion. Hepatobiliary: Surgical changes of cholecystectomy. Unremarkable liver parenchyma. Dilated common bile duct measuring 11 mm, measuring slightly greater than from comparison. Pancreas: Unremarkable. No pancreatic ductal dilatation or surrounding inflammatory changes. Spleen: Normal in size without focal abnormality. Adrenals/Urinary Tract: Unremarkable appearance of the bilateral adrenal glands. No evidence of hydronephrosis. No nephrolithiasis. Unremarkable course the bilateral ureters. Re- demonstration of subcentimeter hypodense lesions of the right kidney with the largest on the lateral cortex. This has features most compatible with benign cyst. Stomach/Bowel: Unremarkable appearance of stomach. Unremarkable small bowel. No abnormal distention of the small bowel. Enteric contrast present within stomach, small bowel, extending to distal small bowel. Presence of the enteric contrast limits evaluation of the bowel wall enhancement. Circumferential wall thickening of the splenic flexure of the colon with associated inflammatory changes and small fluid of the adjacent mesenteric and peritoneal. Diverticular changes are present within colon. Normal appendix. Vascular/Lymphatic: Aortic atherosclerosis. No aneurysm. No dissection flap. Bilateral iliac system remain patent. Proximal femoral vasculature patent. Origin of the celiac artery patent. Origin of the superior mesenteric artery  patent. Patency of the inferior mesenteric artery origin uncertain. Reproductive: Unremarkable appearance of the uterus. Re- demonstration of low-density cystic structure associated with the right adnexa. This measured 2.1 cm previously, now 2.5 cm. Cystic structure appears to have a thin wall with no internal soft tissue. Other: No abdominal wall hernia. Musculoskeletal: Posttraumatic changes of the pelvis. No acute fracture identified. Degenerative changes of the visualized spine. IMPRESSION: Acute colitis at the splenic flexure. Given the changes of diverticular disease, leading differential would be acute diverticulitis. Alternative etiology should also be considered, including infectious/inflammatory as well as ischemic given the location and background changes of vascular disease [place] point). Aortic atherosclerosis. Simple appearing right adnexal cyst measures 2.5 cm. Consensus recommendations would be for annual surveillance in a patient of this age, and referral for gynecologic evaluation should be considered if not already initiated. These results were called by telephone at the time of interpretation on 08/18/2016 at 1:07 pm to Dr. Rudene Re , who verbally acknowledged these results. Electronically Signed   By: Corrie Mckusick D.O.   On: 08/18/2016 13:07       Today   Subjective:   [place]  patient feels well denies any abdominal pain nausea or vomiting  Objective:   Blood pressure 139/63, pulse (!) 56, temperature 97.9 F (36.6 C), resp. rate 14, height 5\' 2"  (1.575 m), weight 124 lb 4.8 oz (56.4 kg), SpO2 93 %.  .  Intake/Output Summary (Last 24 hours) at 08/21/16 1532 Last data filed at 08/21/16 0900  Gross per 24 hour  Intake          2446.67 ml  Output              700 ml  Net          1746.67 ml    Exam VITAL SIGNS: Blood pressure 139/63, pulse (!) 56, temperature 97.9 F (36.6 C), resp. rate 14, height 5\' 2"  (1.575 m), weight 124 lb 4.8 oz (56.4 kg), SpO2 93  %.  GENERAL:  76 y.o.-year-old patient lying in the bed with  no acute distress.  EYES: Pupils equal, round, reactive to light and accommodation. No scleral icterus. Extraocular muscles intact.  HEENT: Head atraumatic, normocephalic. Oropharynx and nasopharynx clear.  NECK:  Supple, no jugular venous distention. No thyroid enlargement, no tenderness.  LUNGS: Normal breath sounds bilaterally, no wheezing, rales,rhonchi or crepitation. No use of accessory muscles of respiration.  CARDIOVASCULAR: S1, S2 normal. No murmurs, rubs, or gallops.  ABDOMEN: Soft, nontender, nondistended. Bowel sounds present. No organomegaly or mass.  EXTREMITIES: No pedal edema, cyanosis, or clubbing.  NEUROLOGIC: Cranial nerves II through XII are intact. Muscle strength 5/5 in all extremities. Sensation intact. Gait not checked.  PSYCHIATRIC: The patient is alert and oriented x 3.  SKIN: No obvious rash, lesion, or ulcer.   Data Review     CBC w Diff: Lab Results  Component Value Date   WBC 6.0 08/21/2016   HGB 11.8 (L) 08/21/2016   HGB 10.6 (L) 10/18/2013   HCT 34.4 (L) 08/21/2016   HCT 32.5 (L) 10/18/2013   PLT 136 (L) 08/21/2016   PLT 102 (L) 10/18/2013   LYMPHOPCT 16 08/18/2016   LYMPHOPCT 27.4 10/18/2013   MONOPCT 4 08/18/2016   MONOPCT 10.2 10/18/2013   EOSPCT 0 08/18/2016   EOSPCT 0.9 10/18/2013   BASOPCT 1 08/18/2016   BASOPCT 0.6 10/18/2013   CMP: Lab Results  Component Value Date   NA 141 08/21/2016   NA 145 10/18/2013   K 3.6 08/21/2016   K 3.8 10/18/2013   CL 112 (H) 08/21/2016   CL 113 (H) 10/18/2013   CO2 23 08/21/2016   CO2 28 10/18/2013   BUN 10 08/21/2016   BUN 14 10/18/2013   CREATININE 0.79 08/21/2016   CREATININE 1.02 10/18/2013   PROT 6.4 (L) 08/18/2016   PROT 6.6 10/16/2013   ALBUMIN 3.8 08/18/2016   ALBUMIN 3.7 10/16/2013   BILITOT 0.8 08/18/2016   BILITOT 0.3 10/16/2013   ALKPHOS 76 08/18/2016   ALKPHOS 96 10/16/2013   AST 17 08/18/2016   AST 19 10/16/2013    ALT 9 (L) 08/18/2016   ALT 7 (L) 10/16/2013  .  Micro Results Recent Results (from the past 240 hour(s))  Gastrointestinal Panel by PCR , Stool     Status: None   Collection Time: 08/19/16 12:57 PM  Result Value Ref Range Status   Campylobacter species NOT DETECTED NOT DETECTED Final   Plesimonas shigelloides NOT DETECTED NOT DETECTED Final   Salmonella species NOT DETECTED NOT DETECTED Final   Yersinia enterocolitica NOT DETECTED NOT DETECTED Final   Vibrio species NOT DETECTED NOT DETECTED Final   Vibrio cholerae NOT DETECTED NOT DETECTED Final   Enteroaggregative E coli (EAEC) NOT DETECTED NOT DETECTED Final   Enteropathogenic E coli (EPEC) NOT DETECTED NOT DETECTED Final   Enterotoxigenic E coli (ETEC) NOT DETECTED NOT DETECTED Final   Shiga like toxin producing E coli (STEC) NOT DETECTED NOT DETECTED Final   Shigella/Enteroinvasive E coli (EIEC) NOT DETECTED NOT DETECTED Final   Cryptosporidium NOT DETECTED NOT DETECTED Final   Cyclospora cayetanensis NOT DETECTED NOT DETECTED Final   Entamoeba histolytica NOT DETECTED NOT DETECTED Final   Giardia lamblia NOT DETECTED NOT DETECTED Final   Adenovirus F40/41 NOT DETECTED NOT DETECTED Final   Astrovirus NOT DETECTED NOT DETECTED Final   Norovirus GI/GII NOT DETECTED NOT DETECTED Final   Rotavirus A NOT DETECTED NOT DETECTED Final   Sapovirus (I, II, IV, and V) NOT DETECTED NOT DETECTED Final  C difficile quick scan w PCR  reflex     Status: Abnormal   Collection Time: 08/19/16 12:57 PM  Result Value Ref Range Status   C Diff antigen POSITIVE (A) NEGATIVE Final   C Diff toxin NEGATIVE NEGATIVE Final   C Diff interpretation Results are indeterminate. See PCR results.  Final  Clostridium Difficile by PCR     Status: Abnormal   Collection Time: 08/19/16 12:57 PM  Result Value Ref Range Status   Toxigenic C Difficile by pcr POSITIVE (A) NEGATIVE Final    Comment: Positive for toxigenic [name] with little to no toxin  production. Only treat if clinical presentation suggests symptomatic illness.        Code Status Orders        Start     Ordered   08/18/16 1927  Limited resuscitation (code)  Continuous    Question Answer Comment  In the event of cardiac or respiratory ARREST: Initiate Code Blue, Call Rapid Response Yes   In the event of cardiac or respiratory ARREST: Perform CPR Yes   In the event of cardiac or respiratory ARREST: Perform Intubation/Mechanical Ventilation No   In the event of cardiac or respiratory ARREST: Use NIPPV/BiPAp only if indicated Yes   In the event of cardiac or respiratory ARREST: Administer ACLS medications if indicated Yes   In the event of cardiac or respiratory ARREST: Perform Defibrillation or Cardioversion if indicated Yes      08/18/16 1927    Code Status History    Date Active Date Inactive Code Status Order ID Comments User Context   08/18/2016  3:10 PM 08/18/2016  7:27 PM Full Code [id number]  Demetrios Loll, MD Inpatient   10/21/2014  1:20 PM 10/21/2014  8:37 PM Full Code [id number]  Wellington Hampshire, MD Inpatient   10/20/2014  8:35 PM 10/21/2014  1:20 PM Full Code [id number]  Demetrios Loll, MD Inpatient          Follow-up Information    Leotis Pain, MD Follow up in 2 week(s).   Specialties:  Vascular Surgery, Radiology, Interventional Cardiology Why:  Tuesday, April 24th at 145pm, ccs Contact information: [place] [address] [phone]        [facility], Wilhemena Durie, MD Follow up in 1 week(s).   Specialty:  Physician Assistant Why:  You will need to call for an appointment, ccs Contact information: [place] [address] [phone]           Discharge Medications   Allergies as of 08/21/2016   No Known Allergies     Medication List    STOP taking these medications   aspirin 81 MG EC tablet     TAKE these medications   acetaminophen 500 MG tablet Commonly known as:  TYLENOL Take  500 mg by mouth every 6 (six) hours as needed for mild pain or headache.   carvedilol 6.25 MG tablet Commonly known as:  COREG Take 1 tablet (6.25 mg total) by mouth 2 (two) times daily with a meal.   ferrous sulfate 325 (65 FE) MG tablet Take 325 mg by mouth daily.   lisinopril 40 MG tablet Commonly known as:  PRINIVIL,ZESTRIL TAKE ONE TABLET BY MOUTH ONCE DAILY   nicotine 10 MG inhaler Commonly known as:  NICOTROL Inhale 1 Cartridge (1 continuous puffing total) into the lungs as needed for smoking cessation.   nitroGLYCERIN 0.4 MG SL tablet Commonly known as:  NITROSTAT Place 0.4 mg under the tongue every 5 (five)  minutes as needed for chest pain.   omeprazole 20 MG capsule Commonly known as:  PRILOSEC Take 20 mg by mouth daily.   risperiDONE 0.5 MG tablet Commonly known as:  RISPERDAL Take 0.5 mg by mouth 2 (two) times daily.   simvastatin 40 MG tablet Commonly known as:  ZOCOR Take 40 mg by mouth daily.   vancomycin 125 MG capsule Commonly known as:  VANCOCIN HCL Take 1 capsule (125 mg total) by mouth 4 (four) times daily.   vitamin B-12 1000 MCG tablet Commonly known as:  CYANOCOBALAMIN Take 1,000 mcg by mouth daily.   VITAMIN D-3 PO Take 1 tablet by mouth daily.          Total Time in preparing paper work, data evaluation and todays exam - 35 minutes  Dustin Flock M.D on 08/21/2016 at 3:32 PM  [facility]   Office  [phone]

## 2016-08-21 NOTE — Care Management (Signed)
[facility] has Vancomycin capsules in stock.  Patient's cost will be 204 dollars as she has not met deductible .  Patient verbalizes understanding

## 2016-08-21 NOTE — Discharge Instructions (Signed)
[place] at [place]:  Cardiac diet  DISCHARGE CONDITION:  Stable  ACTIVITY:  Activity as tolerated  OXYGEN:  Home Oxygen: No.   Oxygen Delivery: room air  DISCHARGE LOCATION:  home    ADDITIONAL DISCHARGE INSTRUCTION: ok to resume ASA in 5 days   If you experience worsening of your admission symptoms, develop shortness of breath, life threatening emergency, suicidal or homicidal thoughts you must seek medical attention immediately by calling 911 or calling your MD immediately  if symptoms less severe.  You Must read complete instructions/literature along with all the possible adverse reactions/side effects for all the Medicines you take and that have been prescribed to you. Take any new Medicines after you have completely understood and accpet all the possible adverse reactions/side effects.   Please note  You were cared for by a hospitalist during your hospital stay. If you have any questions about your discharge medications or the care you received while you were in the hospital after you are discharged, you can call the unit and asked to speak with the hospitalist on call if the hospitalist that took care of you is not available. Once you are discharged, your primary care physician will handle any further medical issues. Please note that NO REFILLS for any discharge medications will be authorized once you are discharged, as it is imperative that you return to your primary care physician (or establish a relationship with a primary care physician if you do not have one) for your aftercare needs so that they can reassess your need for medications and monitor your lab values.

## 2016-08-21 NOTE — Progress Notes (Signed)
IV and tele removed from patient. Discharge instructions given to patient and patient's son. Verbalized understanding. No distress at this time. Son is at bedside and will be transporting patient home.

## 2016-08-29 ENCOUNTER — Other Ambulatory Visit: Payer: Self-pay | Admitting: Cardiovascular Disease

## 2016-08-29 DIAGNOSIS — K922 Gastrointestinal hemorrhage, unspecified: Secondary | ICD-10-CM | POA: Diagnosis not present

## 2016-08-29 DIAGNOSIS — I7100 Dissection of unspecified site of aorta: Secondary | ICD-10-CM | POA: Diagnosis not present

## 2016-08-29 DIAGNOSIS — A0472 Enterocolitis due to Clostridium difficile, not specified as recurrent: Secondary | ICD-10-CM | POA: Diagnosis not present

## 2016-08-29 DIAGNOSIS — I10 Essential (primary) hypertension: Secondary | ICD-10-CM | POA: Diagnosis not present

## 2016-09-18 ENCOUNTER — Encounter (INDEPENDENT_AMBULATORY_CARE_PROVIDER_SITE_OTHER): Payer: Self-pay | Admitting: Vascular Surgery

## 2016-09-18 ENCOUNTER — Ambulatory Visit (INDEPENDENT_AMBULATORY_CARE_PROVIDER_SITE_OTHER): Payer: Medicare HMO | Admitting: Vascular Surgery

## 2016-09-18 VITALS — BP 183/94 | HR 75 | Resp 16 | Wt 125.0 lb

## 2016-09-18 DIAGNOSIS — I700 Atherosclerosis of aorta: Secondary | ICD-10-CM | POA: Diagnosis not present

## 2016-09-18 DIAGNOSIS — I10 Essential (primary) hypertension: Secondary | ICD-10-CM

## 2016-09-18 DIAGNOSIS — K529 Noninfective gastroenteritis and colitis, unspecified: Secondary | ICD-10-CM

## 2016-09-18 DIAGNOSIS — E785 Hyperlipidemia, unspecified: Secondary | ICD-10-CM | POA: Diagnosis not present

## 2016-09-18 NOTE — Progress Notes (Signed)
Patient ID: Cheryl Hall, female   DOB: 13-Aug-1940, 76 y.o.   MRN: [id number]  Chief Complaint  Patient presents with  . Follow-up    HPI Cheryl Hall is a 76 y.o. female.  I am asked to see the patient by DR. Serita Grit from [facility] for evaluation of aortic dissection.  The patient reports continued nausea and vague abdominal pain which is intermittent. This is certainly better than it was during her hospitalization for colitis several weeks ago, but not entirely resolved. She denies any ischemic rest pain or ulcerations of the lower extremities. She does not have signs of peripheral embolization. She says she is now stop smoking and she is congratulated on this. She had a CT scan of the abdomen and pelvis during her hospitalization about a month ago which I have independently reviewed. The official report is of a focal aortic dissection. I am not sure this looks like a dissection I think this is more an irregular atherosclerotic plaque in the infrarenal aorta at the midportion of the infrarenal aorta. If it is a dissection, it is extremely small and focal and there is no flow limitation distally or aneurysmal degeneration.   Past Medical History:  Diagnosis Date  . Anemia   . Chronic back pain   . CKD (chronic kidney disease), stage II   . Coronary artery disease    moderate 2-vessel disease  . Diverticulitis   . Essential hypertension   . GERD (gastroesophageal reflux disease)   . H/O Vertebral Fractures   . History of mania   . History of sciatica   . Hyperlipidemia   . Hypertension   . Peripheral neuropathy   . Tobacco abuse     Past Surgical History:  Procedure Laterality Date  . CARDIAC CATHETERIZATION N/A 10/21/2014   Procedure: Left Heart Cath and Coronary Angiography;  Surgeon: Wellington Hampshire, MD;  Location: [place] [facility];  Service: Cardiovascular;  Laterality: N/A;  . CARDIAC CATHETERIZATION  10/21/2014   Dr. Fletcher Anon  . CHOLECYSTECTOMY      Family History    Problem Relation Age of Onset  . Other Mother     died @ 6.  . Cancer Father     died of pancreatic cancer  . CAD Brother     alive in late 2's w/ h/o stenting.  . Breast cancer Neg Hx     Social History Patient reports recently stopping smoking in the last 3 months. Had smoked for approximately 50 years prior to this. No alcohol use. No IVDU  No Known Allergies  Current Outpatient Prescriptions  Medication Sig Dispense Refill  . acetaminophen (TYLENOL) 500 MG tablet Take 500 mg by mouth every 6 (six) hours as needed for mild pain or headache.     . carvedilol (COREG) 6.25 MG tablet TAKE ONE TABLET BY MOUTH TWICE DAILY WITH MEALS 180 tablet 3  . Cholecalciferol (VITAMIN D-3 PO) Take 1 tablet by mouth daily.    . ferrous sulfate 325 (65 FE) MG tablet Take 325 mg by mouth daily.    Marland Kitchen lisinopril (PRINIVIL,ZESTRIL) 40 MG tablet TAKE ONE TABLET BY MOUTH ONCE DAILY 90 tablet 3  . nicotine (NICOTROL) 10 MG inhaler Inhale 1 Cartridge (1 continuous puffing total) into the lungs as needed for smoking cessation. 42 each 0  . nitroGLYCERIN (NITROSTAT) 0.4 MG SL tablet Place 0.4 mg under the tongue every 5 (five) minutes as needed for chest pain.    Marland Kitchen omeprazole (PRILOSEC)  20 MG capsule Take 20 mg by mouth daily.    . risperiDONE (RISPERDAL) 0.5 MG tablet Take 0.5 mg by mouth 2 (two) times daily.    . simvastatin (ZOCOR) 40 MG tablet Take 40 mg by mouth daily.    . vitamin B-12 (CYANOCOBALAMIN) 1000 MCG tablet Take 1,000 mcg by mouth daily.     No current facility-administered medications for this visit.       REVIEW OF SYSTEMS (Negative unless checked)  Constitutional: '[]' Weight loss  '[]' Fever  '[]' Chills Cardiac: '[]' Chest pain   '[]' Chest pressure   '[]' Palpitations   '[]' Shortness of breath when laying flat   '[]' Shortness of breath at rest   '[]' Shortness of breath with exertion. Vascular:  '[]' Pain in legs with walking   '[]' Pain in legs at rest   '[]' Pain in legs when laying flat   '[]' Claudication    '[]' Pain in feet when walking  '[]' Pain in feet at rest  '[]' Pain in feet when laying flat   '[]' History of DVT   '[]' Phlebitis   '[]' Swelling in legs   '[]' Varicose veins   '[]' Non-healing ulcers Pulmonary:   '[]' Uses home oxygen   '[]' Productive cough   '[]' Hemoptysis   '[]' Wheeze  '[]' COPD   '[]' Asthma Neurologic:  '[]' Dizziness  '[]' Blackouts   '[]' Seizures   '[]' History of stroke   '[]' History of TIA  '[]' Aphasia   '[]' Temporary blindness   '[]' Dysphagia   '[]' Weakness or numbness in arms   '[]' Weakness or numbness in legs Musculoskeletal:  '[x]' Arthritis   '[]' Joint swelling   '[]' Joint pain   '[]' Low back pain Hematologic:  '[]' Easy bruising  '[]' Easy bleeding   '[]' Hypercoagulable state   '[]' Anemic  '[]' Hepatitis Gastrointestinal:  '[]' Blood in stool   '[]' Vomiting blood  '[x]' Gastroesophageal reflux/heartburn   '[x]' Abdominal pain Genitourinary:  '[]' Chronic kidney disease   '[]' Difficult urination  '[]' Frequent urination  '[]' Burning with urination   '[]' Hematuria Skin:  '[]' Rashes   '[]' Ulcers   '[]' Wounds Psychological:  '[]' History of anxiety   '[]'  History of major depression.    Physical Exam BP (!) 183/94   Pulse 75   Resp 16   Wt 125 lb (56.7 kg)   BMI 22.86 kg/m  Gen:  WD/WN, NAD Head: [place]/AT, No temporalis wasting.  Ear/Nose/Throat: Hearing grossly intact, nares w/o erythema or drainage, oropharynx w/o Erythema/Exudate Eyes: Conjunctiva clear, sclera non-icteric  Neck: trachea midline.  No bruit or JVD.  Pulmonary:  Good air movement, clear to auscultation bilaterally.  Cardiac: RRR, normal S1, S2, no Murmurs, rubs or gallops. Vascular:  Vessel Right Left  Radial Palpable Palpable  Ulnar Palpable Palpable  Brachial Palpable Palpable  Carotid Palpable, without bruit Palpable, without bruit  Aorta Not palpable N/A  Femoral Palpable Palpable  Popliteal Palpable Palpable  PT 1+ Palpable Palpable  DP Palpable Palpable   Gastrointestinal: soft, non-tender/non-distended. No guarding/reflex. No masses, surgical incisions, or scars. No increase aortic  impulse Musculoskeletal: M/S 5/5 throughout.  Extremities without ischemic changes.  No deformity or atrophy.  Neurologic: Sensation grossly intact in extremities.  Symmetrical.  Speech is fluent. Motor exam as listed above. Psychiatric: Judgment intact, Mood & affect appropriate for pt's clinical situation. Dermatologic: No rashes or ulcers noted.  No cellulitis or open wounds.  Radiology Ct Angio Abdomen W &/or Wo Contrast  Result Date: 08/19/2016 CLINICAL DATA:  Generalized abdominal pain. EXAM: CT ANGIOGRAPHY ABDOMEN TECHNIQUE: Multidetector CT imaging of the abdomen was performed using the standard protocol during bolus administration of intravenous contrast. Multiplanar reconstructed images and MIPs were obtained and reviewed to evaluate the vascular anatomy.  CONTRAST:  80 mL of Isovue 370 intravenously. COMPARISON:  CT scan of August 18, 2016. FINDINGS: VASCULAR Aorta: Atherosclerosis is noted without aneurysm formation. Small focal dissection is noted in the infrarenal abdominal aorta. Celiac: Moderate stenosis is noted at the origin. SMA: Patent without evidence of aneurysm, dissection, vasculitis or significant stenosis. Renals: 2 left renal arteries are noted which are widely patent. Moderate focal stenosis is noted at the origin the right renal artery. IMA: Patent without evidence of aneurysm, dissection, vasculitis or significant stenosis. Inflow: Iliac arteries are not included in the field-of-view. Veins: No obvious venous abnormality within the limitations of this arterial phase study. Review of the MIP images confirms the above findings. NON-VASCULAR Lower chest: Left posterior basilar atelectasis is noted. Hepatobiliary: No focal liver abnormality is seen. Status post cholecystectomy. No biliary dilatation. Pancreas: Unremarkable. No pancreatic ductal dilatation or surrounding inflammatory changes. Spleen: Normal in size without focal abnormality. Adrenals/Urinary Tract: Adrenal glands and  kidneys are unremarkable. Stomach/Bowel: Wall thickening of visualized proximal descending colon is noted suggesting colitis. Lymphatic: No significant adenopathy is noted. Other: No abdominal wall hernia or abnormality. No abdominopelvic ascites. Musculoskeletal: No acute or significant osseous findings. 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 NON-VASCULAR Left posterior basilar subsegmental atelectasis. Wall thickening of visualized proximal descending colon suggesting colitis. Electronically Signed   By: Marijo Conception, M.D.   On: 08/19/2016 16:19    Labs Recent Results (from the past 2160 hour(s))  CBC with Differential/Platelet     Status: Abnormal   Collection Time: 08/18/16  9:50 AM  Result Value Ref Range   WBC 9.9 3.6 - 11.0 K/uL   RBC 4.30 3.80 - 5.20 MIL/uL   Hemoglobin 13.7 12.0 - 16.0 g/dL   HCT 40.3 35.0 - 47.0 %   MCV 93.8 80.0 - 100.0 fL   MCH 31.8 26.0 - 34.0 pg   MCHC 33.9 32.0 - 36.0 g/dL   RDW 13.7 11.5 - 14.5 %   Platelets 156 150 - 440 K/uL   Neutrophils Relative % 79 %   Neutro Abs 7.8 (H) 1.4 - 6.5 K/uL   Lymphocytes Relative 16 %   Lymphs Abs 1.6 1.0 - 3.6 K/uL   Monocytes Relative 4 %   Monocytes Absolute 0.4 0.2 - 0.9 K/uL   Eosinophils Relative 0 %   Eosinophils Absolute 0.0 0 - 0.7 K/uL   Basophils Relative 1 %   Basophils Absolute 0.1 0 - 0.1 K/uL  Comprehensive metabolic panel     Status: Abnormal   Collection Time: 08/18/16  9:50 AM  Result Value Ref Range   Sodium 138 135 - 145 mmol/L   Potassium 3.9 3.5 - 5.1 mmol/L   Chloride 107 101 - 111 mmol/L   CO2 23 22 - 32 mmol/L   Glucose, Bld 92 65 - 99 mg/dL   BUN 19 6 - 20 mg/dL   Creatinine, Ser 0.77 0.44 - 1.00 mg/dL   Calcium 9.0 8.9 - 10.3 mg/dL   Total Protein 6.4 (L) 6.5 - 8.1 g/dL   Albumin 3.8  3.5 - 5.0 g/dL   AST 17 15 - 41 U/L   ALT 9 (L) 14 - 54 U/L   Alkaline Phosphatase 76 38 - 126 U/L   Total Bilirubin 0.8 0.3 - 1.2 mg/dL   GFR calc non Af Amer >60 >60 mL/min  GFR calc Af Amer >60 >60 mL/min    Comment: (NOTE) The eGFR has been calculated using the CKD EPI equation. This calculation has not been validated in all clinical situations. eGFR's persistently <60 mL/min signify possible Chronic Kidney Disease.    Anion gap 8 5 - 15  Lipase, blood     Status: None   Collection Time: 08/18/16  9:50 AM  Result Value Ref Range   Lipase 15 11 - 51 U/L  Protime-INR     Status: None   Collection Time: 08/18/16  9:50 AM  Result Value Ref Range   Prothrombin Time 12.7 11.4 - 15.2 seconds   INR 0.95   Type and screen     Status: None   Collection Time: 08/18/16  9:50 AM  Result Value Ref Range   ABO/RH(D) O POS    Antibody Screen NEG    Sample Expiration 08/21/2016   Urinalysis, Complete w Microscopic     Status: Abnormal   Collection Time: 08/18/16 10:09 AM  Result Value Ref Range   Color, Urine YELLOW (A) YELLOW   APPearance CLEAR (A) CLEAR   Specific Gravity, Urine 1.019 1.005 - 1.030   pH 6.0 5.0 - 8.0   Glucose, UA NEGATIVE NEGATIVE mg/dL   Hgb urine dipstick MODERATE (A) NEGATIVE   Bilirubin Urine NEGATIVE NEGATIVE   Ketones, ur 5 (A) NEGATIVE mg/dL   Protein, ur NEGATIVE NEGATIVE mg/dL   Nitrite NEGATIVE NEGATIVE   Leukocytes, UA NEGATIVE NEGATIVE   RBC / HPF 0-5 0 - 5 RBC/hpf   WBC, UA 0-5 0 - 5 WBC/hpf   Bacteria, UA NONE SEEN NONE SEEN   Squamous Epithelial / LPF 0-5 (A) NONE SEEN   Mucous PRESENT   Hemoglobin     Status: None   Collection Time: 08/18/16  3:33 PM  Result Value Ref Range   Hemoglobin 13.2 12.0 - 16.0 g/dL  Troponin I     Status: None   Collection Time: 08/18/16  6:46 PM  Result Value Ref Range   Troponin I <0.03 <0.03 ng/mL  Hemoglobin     Status: None   Collection Time: 08/18/16  9:59 PM  Result Value Ref Range   Hemoglobin 12.6  12.0 - 16.0 g/dL  Basic metabolic panel     Status: Abnormal   Collection Time: 08/19/16  5:08 AM  Result Value Ref Range   Sodium 137 135 - 145 mmol/L   Potassium 3.7 3.5 - 5.1 mmol/L   Chloride 108 101 - 111 mmol/L   CO2 24 22 - 32 mmol/L   Glucose, Bld 77 65 - 99 mg/dL   BUN 17 6 - 20 mg/dL   Creatinine, Ser 0.91 0.44 - 1.00 mg/dL   Calcium 8.3 (L) 8.9 - 10.3 mg/dL   GFR calc non Af Amer >60 >60 mL/min   GFR calc Af Amer >60 >60 mL/min    Comment: (NOTE) The eGFR has been calculated using the CKD EPI equation. This calculation has not been validated in all clinical situations. eGFR's persistently <60 mL/min signify possible Chronic Kidney Disease.    Anion gap 5 5 - 15  CBC     Status: Abnormal   Collection Time: 08/19/16  5:08 AM  Result Value Ref Range   WBC 8.1 3.6 - 11.0 K/uL   RBC 3.94 3.80 - 5.20 MIL/uL   Hemoglobin 12.7 12.0 - 16.0 g/dL   HCT 37.5 35.0 - 47.0 %   MCV 95.1 80.0 - 100.0 fL  MCH 32.1 26.0 - 34.0 pg   MCHC 33.7 32.0 - 36.0 g/dL   RDW 14.1 11.5 - 14.5 %   Platelets 143 (L) 150 - 440 K/uL  Troponin I     Status: None   Collection Time: 08/19/16  5:08 AM  Result Value Ref Range   Troponin I <0.03 <0.03 ng/mL  Hemoglobin     Status: None   Collection Time: 08/19/16  9:58 AM  Result Value Ref Range   Hemoglobin 12.9 12.0 - 16.0 g/dL  Gastrointestinal Panel by PCR , Stool     Status: None   Collection Time: 08/19/16 12:57 PM  Result Value Ref Range   Campylobacter species NOT DETECTED NOT DETECTED   Plesimonas shigelloides NOT DETECTED NOT DETECTED   Salmonella species NOT DETECTED NOT DETECTED   Yersinia enterocolitica NOT DETECTED NOT DETECTED   Vibrio species NOT DETECTED NOT DETECTED   Vibrio cholerae NOT DETECTED NOT DETECTED   Enteroaggregative E coli (EAEC) NOT DETECTED NOT DETECTED   Enteropathogenic E coli (EPEC) NOT DETECTED NOT DETECTED   Enterotoxigenic E coli (ETEC) NOT DETECTED NOT DETECTED   Shiga like toxin producing E coli (STEC)  NOT DETECTED NOT DETECTED   Shigella/Enteroinvasive E coli (EIEC) NOT DETECTED NOT DETECTED   Cryptosporidium NOT DETECTED NOT DETECTED   Cyclospora cayetanensis NOT DETECTED NOT DETECTED   Entamoeba histolytica NOT DETECTED NOT DETECTED   Giardia lamblia NOT DETECTED NOT DETECTED   Adenovirus F40/41 NOT DETECTED NOT DETECTED   Astrovirus NOT DETECTED NOT DETECTED   Norovirus GI/GII NOT DETECTED NOT DETECTED   Rotavirus A NOT DETECTED NOT DETECTED   Sapovirus (I, II, IV, and V) NOT DETECTED NOT DETECTED  C difficile quick scan w PCR reflex     Status: Abnormal   Collection Time: 08/19/16 12:57 PM  Result Value Ref Range   C Diff antigen POSITIVE (A) NEGATIVE   C Diff toxin NEGATIVE NEGATIVE   C Diff interpretation Results are indeterminate. See PCR results.   Clostridium Difficile by PCR     Status: Abnormal   Collection Time: 08/19/16 12:57 PM  Result Value Ref Range   Toxigenic C Difficile by pcr POSITIVE (A) NEGATIVE    Comment: Positive for toxigenic [name] with little to no toxin production. Only treat if clinical presentation suggests symptomatic illness.  CBC     Status: Abnormal   Collection Time: 08/20/16  4:22 AM  Result Value Ref Range   WBC 7.4 3.6 - 11.0 K/uL   RBC 3.98 3.80 - 5.20 MIL/uL   Hemoglobin 12.6 12.0 - 16.0 g/dL   HCT 37.9 35.0 - 47.0 %   MCV 95.4 80.0 - 100.0 fL   MCH 31.7 26.0 - 34.0 pg   MCHC 33.2 32.0 - 36.0 g/dL   RDW 13.9 11.5 - 14.5 %   Platelets 137 (L) 150 - 440 K/uL  Basic metabolic panel     Status: Abnormal   Collection Time: 08/20/16  4:22 AM  Result Value Ref Range   Sodium 140 135 - 145 mmol/L   Potassium 3.6 3.5 - 5.1 mmol/L   Chloride 110 101 - 111 mmol/L   CO2 23 22 - 32 mmol/L   Glucose, Bld 77 65 - 99 mg/dL   BUN 12 6 - 20 mg/dL   Creatinine, Ser 0.93 0.44 - 1.00 mg/dL   Calcium 8.4 (L) 8.9 - 10.3 mg/dL   GFR calc non Af Amer 60 (L) >60 mL/min   GFR calc Af Amer >  60 >60 mL/min    Comment: (NOTE) The eGFR has been  calculated using the CKD EPI equation. This calculation has not been validated in all clinical situations. eGFR's persistently <60 mL/min signify possible Chronic Kidney Disease.    Anion gap 7 5 - 15  CBC     Status: Abnormal   Collection Time: 08/21/16  4:14 AM  Result Value Ref Range   WBC 6.0 3.6 - 11.0 K/uL   RBC 3.64 (L) 3.80 - 5.20 MIL/uL   Hemoglobin 11.8 (L) 12.0 - 16.0 g/dL   HCT 34.4 (L) 35.0 - 47.0 %   MCV 94.4 80.0 - 100.0 fL   MCH 32.2 26.0 - 34.0 pg   MCHC 34.2 32.0 - 36.0 g/dL   RDW 13.5 11.5 - 14.5 %   Platelets 136 (L) 150 - 440 K/uL  Basic metabolic panel     Status: Abnormal   Collection Time: 08/21/16  4:14 AM  Result Value Ref Range   Sodium 141 135 - 145 mmol/L   Potassium 3.6 3.5 - 5.1 mmol/L   Chloride 112 (H) 101 - 111 mmol/L   CO2 23 22 - 32 mmol/L   Glucose, Bld 84 65 - 99 mg/dL   BUN 10 6 - 20 mg/dL   Creatinine, Ser 0.79 0.44 - 1.00 mg/dL   Calcium 8.3 (L) 8.9 - 10.3 mg/dL   GFR calc non Af Amer >60 >60 mL/min   GFR calc Af Amer >60 >60 mL/min    Comment: (NOTE) The eGFR has been calculated using the CKD EPI equation. This calculation has not been validated in all clinical situations. eGFR's persistently <60 mL/min signify possible Chronic Kidney Disease.    Anion gap 6 5 - 15    Assessment/Plan:  Hyperlipidemia lipid control important in reducing the progression of atherosclerotic disease. Continue statin therapy   Colitis This is much more likely to be the cause of her abdominal symptoms and not her mild irregularity of her abdominal aorta. She is going to contact her primary care physician about her continued symptoms.  Essential hypertension blood pressure control important in reducing the progression of atherosclerotic disease. On appropriate oral medications.   Aortic atherosclerosis ([place]) She had a CT scan of the abdomen and pelvis during her hospitalization about a month ago which I have independently reviewed. The official  report is of a focal aortic dissection. I am not sure this looks like a dissection I think this is more an irregular atherosclerotic plaque in the infrarenal aorta at the midportion of the infrarenal aorta. If it is a dissection, it is extremely small and focal and there is no flow limitation distally or aneurysmal degeneration. Either way, I would not recommend any intervention for this and would just recommend a 3 months follow-up scan with duplex. She says she is already stop smoking and is congratulated for this. Blood pressure control and continued use of aspirin and a statin agent were recommended. I have discussed that we will be monitoring her for both occlusive disease as well as aneurysmal degeneration going forward. She voices her understanding.      Leotis Pain 09/18/2016, 2:19 PM   This note was created with Dragon medical transcription system.  Any errors from dictation are unintentional.

## 2016-09-18 NOTE — Assessment & Plan Note (Signed)
This is much more likely to be the cause of her abdominal symptoms and not her mild irregularity of her abdominal aorta. She is going to contact her primary care physician about her continued symptoms.

## 2016-09-18 NOTE — Assessment & Plan Note (Signed)
blood pressure control important in reducing the progression of atherosclerotic disease. On appropriate oral medications.  

## 2016-09-18 NOTE — Assessment & Plan Note (Signed)
She had a CT scan of the abdomen and pelvis during her hospitalization about a month ago which I have independently reviewed. The official report is of a focal aortic dissection. I am not sure this looks like a dissection I think this is more an irregular atherosclerotic plaque in the infrarenal aorta at the midportion of the infrarenal aorta. If it is a dissection, it is extremely small and focal and there is no flow limitation distally or aneurysmal degeneration. Either way, I would not recommend any intervention for this and would just recommend a 3 months follow-up scan with duplex. She says she is already stop smoking and is congratulated for this. Blood pressure control and continued use of aspirin and a statin agent were recommended. I have discussed that we will be monitoring her for both occlusive disease as well as aneurysmal degeneration going forward. She voices her understanding.

## 2016-09-18 NOTE — Assessment & Plan Note (Signed)
lipid control important in reducing the progression of atherosclerotic disease. Continue statin therapy  

## 2016-09-20 DIAGNOSIS — A09 Infectious gastroenteritis and colitis, unspecified: Secondary | ICD-10-CM | POA: Diagnosis not present

## 2016-09-20 DIAGNOSIS — Z8619 Personal history of other infectious and parasitic diseases: Secondary | ICD-10-CM | POA: Diagnosis not present

## 2016-10-03 DIAGNOSIS — B9689 Other specified bacterial agents as the cause of diseases classified elsewhere: Secondary | ICD-10-CM | POA: Diagnosis not present

## 2016-10-03 DIAGNOSIS — K219 Gastro-esophageal reflux disease without esophagitis: Secondary | ICD-10-CM | POA: Diagnosis not present

## 2016-10-03 DIAGNOSIS — K2960 Other gastritis without bleeding: Secondary | ICD-10-CM | POA: Diagnosis not present

## 2016-10-08 ENCOUNTER — Emergency Department: Payer: Medicare HMO

## 2016-10-08 ENCOUNTER — Inpatient Hospital Stay
Admission: EM | Admit: 2016-10-08 | Discharge: 2016-10-10 | DRG: 282 | Disposition: A | Discharge status: Home / Self Care | Payer: Medicare HMO | Attending: Internal Medicine | Admitting: Internal Medicine

## 2016-10-08 ENCOUNTER — Encounter: Payer: Self-pay | Admitting: Emergency Medicine

## 2016-10-08 DIAGNOSIS — E785 Hyperlipidemia, unspecified: Secondary | ICD-10-CM | POA: Diagnosis not present

## 2016-10-08 DIAGNOSIS — N182 Chronic kidney disease, stage 2 (mild): Secondary | ICD-10-CM | POA: Diagnosis not present

## 2016-10-08 DIAGNOSIS — G629 Polyneuropathy, unspecified: Secondary | ICD-10-CM | POA: Diagnosis not present

## 2016-10-08 DIAGNOSIS — F17210 Nicotine dependence, cigarettes, uncomplicated: Secondary | ICD-10-CM | POA: Diagnosis present

## 2016-10-08 DIAGNOSIS — I2510 Atherosclerotic heart disease of native coronary artery without angina pectoris: Secondary | ICD-10-CM | POA: Diagnosis not present

## 2016-10-08 DIAGNOSIS — R0789 Other chest pain: Secondary | ICD-10-CM | POA: Diagnosis not present

## 2016-10-08 DIAGNOSIS — R7989 Other specified abnormal findings of blood chemistry: Secondary | ICD-10-CM

## 2016-10-08 DIAGNOSIS — R079 Chest pain, unspecified: Secondary | ICD-10-CM

## 2016-10-08 DIAGNOSIS — Z9861 Coronary angioplasty status: Secondary | ICD-10-CM

## 2016-10-08 DIAGNOSIS — I10 Essential (primary) hypertension: Secondary | ICD-10-CM | POA: Diagnosis present

## 2016-10-08 DIAGNOSIS — Z9049 Acquired absence of other specified parts of digestive tract: Secondary | ICD-10-CM

## 2016-10-08 DIAGNOSIS — I700 Atherosclerosis of aorta: Secondary | ICD-10-CM | POA: Diagnosis present

## 2016-10-08 DIAGNOSIS — N289 Disorder of kidney and ureter, unspecified: Secondary | ICD-10-CM

## 2016-10-08 DIAGNOSIS — I252 Old myocardial infarction: Secondary | ICD-10-CM | POA: Diagnosis not present

## 2016-10-08 DIAGNOSIS — I129 Hypertensive chronic kidney disease with stage 1 through stage 4 chronic kidney disease, or unspecified chronic kidney disease: Secondary | ICD-10-CM | POA: Diagnosis not present

## 2016-10-08 DIAGNOSIS — R739 Hyperglycemia, unspecified: Secondary | ICD-10-CM | POA: Diagnosis present

## 2016-10-08 DIAGNOSIS — I214 Non-ST elevation (NSTEMI) myocardial infarction: Principal | ICD-10-CM | POA: Diagnosis present

## 2016-10-08 DIAGNOSIS — G8929 Other chronic pain: Secondary | ICD-10-CM | POA: Diagnosis not present

## 2016-10-08 DIAGNOSIS — K219 Gastro-esophageal reflux disease without esophagitis: Secondary | ICD-10-CM | POA: Diagnosis present

## 2016-10-08 DIAGNOSIS — D696 Thrombocytopenia, unspecified: Secondary | ICD-10-CM

## 2016-10-08 DIAGNOSIS — M549 Dorsalgia, unspecified: Secondary | ICD-10-CM | POA: Diagnosis not present

## 2016-10-08 DIAGNOSIS — Z800 Family history of malignant neoplasm of digestive organs: Secondary | ICD-10-CM

## 2016-10-08 DIAGNOSIS — E611 Iron deficiency: Secondary | ICD-10-CM | POA: Diagnosis not present

## 2016-10-08 DIAGNOSIS — Z8249 Family history of ischemic heart disease and other diseases of the circulatory system: Secondary | ICD-10-CM | POA: Diagnosis not present

## 2016-10-08 DIAGNOSIS — Z720 Tobacco use: Secondary | ICD-10-CM | POA: Diagnosis present

## 2016-10-08 DIAGNOSIS — I071 Rheumatic tricuspid insufficiency: Secondary | ICD-10-CM | POA: Diagnosis present

## 2016-10-08 DIAGNOSIS — R748 Abnormal levels of other serum enzymes: Secondary | ICD-10-CM | POA: Diagnosis present

## 2016-10-08 DIAGNOSIS — R778 Other specified abnormalities of plasma proteins: Secondary | ICD-10-CM

## 2016-10-08 DIAGNOSIS — M810 Age-related osteoporosis without current pathological fracture: Secondary | ICD-10-CM | POA: Diagnosis present

## 2016-10-08 DIAGNOSIS — I342 Nonrheumatic mitral (valve) stenosis: Secondary | ICD-10-CM | POA: Diagnosis not present

## 2016-10-08 DIAGNOSIS — E782 Mixed hyperlipidemia: Secondary | ICD-10-CM | POA: Diagnosis not present

## 2016-10-08 DIAGNOSIS — I7102 Dissection of abdominal aorta: Secondary | ICD-10-CM | POA: Diagnosis not present

## 2016-10-08 LAB — BASIC METABOLIC PANEL
Anion gap: 8 (ref 5–15)
BUN: 15 mg/dL (ref 6–20)
CALCIUM: 8.6 mg/dL — AB (ref 8.9–10.3)
CHLORIDE: 106 mmol/L (ref 101–111)
CO2: 23 mmol/L (ref 22–32)
Creatinine, Ser: 1.04 mg/dL — ABNORMAL HIGH (ref 0.44–1.00)
GFR calc non Af Amer: 52 mL/min — ABNORMAL LOW (ref >60)
Glucose, Bld: 122 mg/dL — ABNORMAL HIGH (ref 65–99)
POTASSIUM: 3.9 mmol/L (ref 3.5–5.1)
Sodium: 137 mmol/L (ref 135–145)

## 2016-10-08 LAB — HEPATIC FUNCTION PANEL
ALT: 7 U/L — AB (ref 14–54)
AST: 22 U/L (ref 15–41)
Albumin: 3.6 g/dL (ref 3.5–5.0)
Alkaline Phosphatase: 65 U/L (ref 38–126)
Total Bilirubin: 0.4 mg/dL (ref 0.3–1.2)
Total Protein: 6.1 g/dL — ABNORMAL LOW (ref 6.5–8.1)

## 2016-10-08 LAB — CBC
HEMATOCRIT: 37.6 % (ref 35.0–47.0)
HEMOGLOBIN: 12.8 g/dL (ref 12.0–16.0)
MCH: 32.3 pg (ref 26.0–34.0)
MCHC: 34 g/dL (ref 32.0–36.0)
MCV: 94.9 fL (ref 80.0–100.0)
Platelets: 154 10*3/uL (ref 150–440)
RBC: 3.96 MIL/uL (ref 3.80–5.20)
RDW: 14.1 % (ref 11.5–14.5)
WBC: 6.5 10*3/uL (ref 3.6–11.0)

## 2016-10-08 LAB — TROPONIN I: TROPONIN I: 0.16 ng/mL — AB (ref <0.03)

## 2016-10-08 LAB — LIPASE, BLOOD: Lipase: 36 U/L (ref 11–51)

## 2016-10-08 MED ORDER — NITROGLYCERIN 2 % TD OINT
TOPICAL_OINTMENT | TRANSDERMAL | Status: AC
  Filled 2016-10-08: qty 1

## 2016-10-08 MED ORDER — NITROGLYCERIN 2 % TD OINT
1.0000 [in_us] | TOPICAL_OINTMENT | Freq: Once | TRANSDERMAL | Status: AC
  Administered 2016-10-08: 1 [in_us] via TOPICAL

## 2016-10-08 MED ORDER — ASPIRIN 81 MG PO CHEW
324.0000 mg | CHEWABLE_TABLET | Freq: Once | ORAL | Status: AC
  Administered 2016-10-08: 324 mg via ORAL
  Filled 2016-10-08: qty 4

## 2016-10-08 NOTE — ED Provider Notes (Addendum)
[facility] Emergency Department Provider Note  ____________________________________________   I have reviewed the triage vital signs and the nursing notes.   HISTORY  Chief Complaint Chest Pain    HPI Cheryl Hall is a 76 y.o. female  states that she had not chest pain but epigastric abdominal discomfort today. She states that it lasted for few minutes. It did seem to get better after nitroglycerin. It is a burning/pressure-like discomfort. It was associated with burping. She denies any chest pain or shortness of breath otherwise. Did not radiate. Happen at rest. Has had no pain since that time. Denies any fever or chills. This happened around 1:00 this afternoon. Patient states about seem to get better after nitroglycerin, she is not convinced that is what made it get better. Lasted for a brief period of time 15-20 minutes perhaps. She states that she has been pain-free and normal since then. Able to eat and drink with no difficulty. No chest pain or shortness of breath no nausea or vomiting. No diarrhea. No melena bright red blood per rectum. Has no complete this time. Is anxious with Dr. go home. She does have a history of reflux disease. This does feel somewhat like it. He did have [name] starting 2 months ago but that is resolved. She has no gallbladder. She has no history of aneurysm. He had no pleuritic chest pain or shortness of breath or personal or family history of PE or DVT, no leg swelling. She had a very reassuring heart catheterization in 2016 showing at most a 40% LAD has no history of stents. No history of aortic aneurysm and recent CT     Past Medical History:  Diagnosis Date  . Anemia   . Chronic back pain   . CKD (chronic kidney disease), stage II   . Coronary artery disease    moderate 2-vessel disease  . Diverticulitis   . Essential hypertension   . GERD (gastroesophageal reflux disease)   . H/O Vertebral Fractures   . History of  mania   . History of sciatica   . Hyperlipidemia   . Hypertension   . Peripheral neuropathy   . Tobacco abuse     Patient Active Problem List   Diagnosis Date Noted  . Aortic atherosclerosis ([place]) 09/18/2016  . Colitis   . Lower GI bleed   . Acute colitis 08/18/2016  . Coronary artery disease involving native coronary artery without angina pectoris 11/15/2014  . Hyperlipidemia   . Essential hypertension   . Tobacco abuse   . CKD (chronic kidney disease), stage II   . GERD (gastroesophageal reflux disease)   . NSTEMI (non-ST elevated myocardial infarction) ([place]) 10/20/2014    Past Surgical History:  Procedure Laterality Date  . CARDIAC CATHETERIZATION N/A 10/21/2014   Procedure: Left Heart Cath and Coronary Angiography;  Surgeon: Wellington Hampshire, MD;  Location: [place] CV [facility];  Service: Cardiovascular;  Laterality: N/A;  . CARDIAC CATHETERIZATION  10/21/2014   Dr. Fletcher Anon  . CHOLECYSTECTOMY      Prior to Admission medications   Medication Sig Start Date End Date Taking? Authorizing Provider  acetaminophen (TYLENOL) 500 MG tablet Take 500 mg by mouth every 6 (six) hours as needed for mild pain or headache.     [provider]  carvedilol (COREG) 6.25 MG tablet TAKE ONE TABLET BY MOUTH TWICE DAILY WITH MEALS 08/30/16   Minna Merritts, MD  Cholecalciferol (VITAMIN D-3 PO) Take 1 tablet by mouth daily.  [provider]  ferrous sulfate 325 (65 FE) MG tablet Take 325 mg by mouth daily.    [provider]  lisinopril (PRINIVIL,ZESTRIL) 40 MG tablet TAKE ONE TABLET BY MOUTH ONCE DAILY 04/10/16   Wellington Hampshire, MD  nicotine (NICOTROL) 10 MG inhaler Inhale 1 Cartridge (1 continuous puffing total) into the lungs as needed for smoking cessation. 08/21/16   Dustin Flock, MD  nitroGLYCERIN (NITROSTAT) 0.4 MG SL tablet Place 0.4 mg under the tongue every 5 (five) minutes as needed for chest pain.    [provider]  omeprazole (PRILOSEC) 20 MG  capsule Take 20 mg by mouth daily.    [provider]  risperiDONE (RISPERDAL) 0.5 MG tablet Take 0.5 mg by mouth 2 (two) times daily.    [provider]  simvastatin (ZOCOR) 40 MG tablet Take 40 mg by mouth daily.    [provider]  vitamin B-12 (CYANOCOBALAMIN) 1000 MCG tablet Take 1,000 mcg by mouth daily.    [provider]    Allergies Patient has no known allergies.  Family History  Problem Relation Age of Onset  . Other Mother        died @ 49.  . Cancer Father        died of pancreatic cancer  . CAD Brother        alive in late 56's w/ h/o stenting.  . Breast cancer Neg Hx     Social History Social History  Substance Use Topics  . Smoking status: Current Some Day Smoker    Packs/day: 0.25    Years: 50.00    Types: Cigarettes  . Smokeless tobacco: Never Used     Comment: Smoking 2 cigarettes every other day.   . Alcohol use No    Review of Systems Constitutional: No fever/chills Eyes: No visual changes. ENT: No sore throat. No stiff neck no neck pain Cardiovascular:See history of present illness. Respiratory: Denies shortness of breath. Gastrointestinal:   no vomiting.  No diarrhea.  No constipation. Genitourinary: Negative for dysuria. Musculoskeletal: Negative lower extremity swelling Skin: Negative for rash. Neurological: Negative for severe headaches, focal weakness or numbness. 10-point ROS otherwise negative.  ____________________________________________   PHYSICAL EXAM:  VITAL SIGNS: ED Triage Vitals  Enc Vitals Group     BP 10/08/16 1855 (!) 157/82     Pulse Rate 10/08/16 1855 70     Resp 10/08/16 1855 20     Temp 10/08/16 1855 98.5 F (36.9 C)     Temp Source 10/08/16 1855 Oral     SpO2 10/08/16 1855 97 %     Weight 10/08/16 1856 125 lb (56.7 kg)     Height 10/08/16 1856 5\' 2"  (1.575 m)     Head Circumference --      Peak Flow --      Pain Score --      Pain Loc --      Pain Edu? --      Excl. in  [place]? --     Constitutional: Alert and oriented. Well appearing and in no acute distress.She is somewhat anxious and upset about being here and she would like to go home otherwise no acute distress Eyes: Conjunctivae are normal. PERRL. EOMI. Head: Atraumatic. Nose: No congestion/rhinnorhea. Mouth/Throat: Mucous membranes are moist.  Oropharynx non-erythematous. Neck: No stridor.   Nontender with no meningismus Cardiovascular: Normal rate, regular rhythm. Grossly normal heart sounds.  Good peripheral circulation. Respiratory: Normal respiratory effort.  No retractions. Lungs CTAB.  Abdominal: Soft and deep palpation in the epigastric region does reproduce the patient's discomfort. It is very minimal, no guarding or rebound no pulsatile mass.. No distention. No guarding no rebound Back:  There is no focal tenderness or step off.  there is no midline tenderness there are no lesions noted. there is no CVA tenderness Musculoskeletal: No lower extremity tenderness, no upper extremity tenderness. No joint effusions, no DVT signs strong distal pulses no edema Neurologic:  Normal speech and language. No gross focal neurologic deficits are appreciated.  Skin:  Skin is warm, dry and intact. No rash noted. Psychiatric: Mood and affect are normal. Speech and behavior are normal.  ____________________________________________   LABS (all labs ordered are listed, but only abnormal results are displayed)  Labs Reviewed  BASIC METABOLIC PANEL - Abnormal; Notable for the following:       Result Value   Glucose, Bld 122 (*)    Creatinine, Ser 1.04 (*)    Calcium 8.6 (*)    GFR calc non Af Amer 52 (*)    All other components within normal limits  CBC  TROPONIN I  HEPATIC FUNCTION PANEL  LIPASE, BLOOD   ____________________________________________  EKG  I personally interpreted any EKGs ordered by me or triage Sinus rhythm rate 60 bpm no acute ST elevation or depression ST changes, normal axis no  change from prior ____________________________________________  RADIOLOGY  I reviewed any imaging ordered by me or triage that were performed during my shift and, if possible, patient and/or family made aware of any abnormal findings. ____________________________________________   PROCEDURES  Procedure(s) performed: None  Procedures  Critical Care performed: None  ____________________________________________   INITIAL IMPRESSION / ASSESSMENT AND PLAN / ED COURSE  Pertinent labs & imaging results that were available during my care of the patient were reviewed by me and considered in my medical decision making (see chart for details).  Patient with epigastric abdominal discomfort that was very brief. She denies any nausea or vomiting. She had no hematemesis. No evidence of aneurysm on prior CT or on exam at this time. We are sending liver for gentamicin lipase. She is asymptomatic but she is very anxious and upset about going home. She declines admission. She does not have any evidence of significant blockage on recent cardiac catheter and 2016. I will send a second cardiac markers negative we will comply with the patient's desire to be discharged. Obviously she can leave before that if she elects to. Patient has agreed with this plan at this time. There is no evidence of significant intra-abdominal pathology requiring imaging at this time. Nothing at this time to just PE or dissection. We will send second troponin however she is a smoker with risk factors who has known partial blockages and it got better with nitroglycerin  ----------------------------------------- 10:45 PM on 10/08/2016 -----------------------------------------  Patient troponin is 0.16. She is very anxious and upset with back to go home but we have talked about it and she is understands it is safer for her to be admitted. We'll give her aspirin, blood pressures elevated and the patient is somewhat anxious and upset.  She very much would prefer to go home to the house but she does understand the medical necessity of further evaluation given her chest pain and troponin. She remains chest pain-free fortunately. We'll give her nitroglycerin to her chest wall and we will admit her after aspirin.  ----------------------------------------- 12:01 AM on 10/09/2016 -----------------------------------------   Relaxants, her blood pressure is trying to 170/70  now, less 30 points less than in its maximum, we will avoid acutely adding other pressure medications as she is asymptomatic, and likely lives at a higher level. We'll continue to monitor department this she has been handed over to hospitalist who is currently managing her  ____________________________________________   FINAL CLINICAL IMPRESSION(S) / ED DIAGNOSES  Final diagnoses:  None      This chart was dictated using voice recognition software.  Despite best efforts to proofread,  errors can occur which can change meaning.      Schuyler Amor, MD 10/08/16 2143    Schuyler Amor, MD 10/08/16 7445    Schuyler Amor, MD 10/09/16 Dyann Kief

## 2016-10-08 NOTE — ED Triage Notes (Signed)
Pt presents to ED c/o chest pain that started since 12 today. Pt had centralized chest pain with pressure . Has taken 2 nitroglycerins without relief

## 2016-10-08 NOTE — H&P (Signed)
[place] @ [facility] Admission History and Physical Harvie Bridge, D.O.  ---------------------------------------------------------------------------------------------------------------------   PATIENT NAME: Cheryl Hall MR#: [id number] DATE OF BIRTH: 03-31-1941 DATE OF ADMISSION: 10/08/2016 PRIMARY CARE PHYSICIAN: Marinda Elk, MD  REQUESTING/REFERRING PHYSICIAN: ED Dr. Burlene Arnt  CHIEF COMPLAINT: Chief Complaint  Patient presents with  . Chest Pain    HISTORY OF PRESENT ILLNESS: Cheryl Hall is a 76 y.o. female with a known history of Recent [name] colitis, Anemia, CKD2, coronary artery disease status post non-STEMI, hypertension, GERD, hypertension, hyperlipidemia, peripheral neuropathy presents to the emergency department for evaluation of chest pain.  Patient was in a usual state of health until this afternoon when she describes localized burning central chest discomfort that occurred at rest. Pain resolved after nitroglycerin. She denied any associated nausea, diaphoresis, palpitations. She did have associated epigastric pain and burping.  Of note last cardiac catheter was in 2016 revealed a 40% occluded LAD. She does follow with Dr.Arida.   Otherwise there has been no change in status. Patient has been taking medication as prescribed and there has been no recent change in medication or diet.  There has been no recent illness, travel or sick contacts.    Patient denies fevers/chills, weakness, dizziness, shortness of breath, N/V/C/D, dysuria/frequency, changes in mental status.   EMS/ED COURSE:   Patient received aspirin, topical nitroglycerin.  PAST MEDICAL HISTORY: Past Medical History:  Diagnosis Date  . Anemia   . Chronic back pain   . CKD (chronic kidney disease), stage II   . Coronary artery disease    moderate 2-vessel disease  . Diverticulitis   . Essential hypertension   . GERD (gastroesophageal reflux disease)   . H/O Vertebral Fractures    . History of mania   . History of sciatica   . Hyperlipidemia   . Hypertension   . Peripheral neuropathy   . Tobacco abuse   Vitamin D deficiency, osteoporosis, recent [name] colitis    PAST SURGICAL HISTORY: Past Surgical History:  Procedure Laterality Date  . CARDIAC CATHETERIZATION N/A 10/21/2014   Procedure: Left Heart Cath and Coronary Angiography;  Surgeon: Wellington Hampshire, MD;  Location: [place] CV LAB;  Service: Cardiovascular;  Laterality: N/A;  . CARDIAC CATHETERIZATION  10/21/2014   Dr. Fletcher Anon  . CHOLECYSTECTOMY        SOCIAL HISTORY: Social History  Substance Use Topics  . Smoking status: Current Some Day Smoker    Packs/day: 0.25    Years: 50.00    Types: Cigarettes  . Smokeless tobacco: Never Used     Comment: Smoking 2 cigarettes every other day.   . Alcohol use No      FAMILY HISTORY: Family History  Problem Relation Age of Onset  . Other Mother        died @ 54.  . Cancer Father        died of pancreatic cancer  . CAD Brother        alive in late 33's w/ h/o stenting.  . Breast cancer Neg Hx      MEDICATIONS AT HOME: Prior to Admission medications   Medication Sig Start Date End Date Taking? Authorizing Provider  acetaminophen (TYLENOL) 500 MG tablet Take 500 mg by mouth every 6 (six) hours as needed for mild pain or headache.     [provider]  carvedilol (COREG) 6.25 MG tablet TAKE ONE TABLET BY MOUTH TWICE DAILY WITH MEALS 08/30/16   Rockey Situ, Kathlene November, MD  Cholecalciferol (  VITAMIN D-3 PO) Take 1 tablet by mouth daily.    [provider]  ferrous sulfate 325 (65 FE) MG tablet Take 325 mg by mouth daily.    [provider]  lisinopril (PRINIVIL,ZESTRIL) 40 MG tablet TAKE ONE TABLET BY MOUTH ONCE DAILY 04/10/16   Wellington Hampshire, MD  nicotine (NICOTROL) 10 MG inhaler Inhale 1 Cartridge (1 continuous puffing total) into the lungs as needed for smoking cessation. 08/21/16   Dustin Flock, MD   nitroGLYCERIN (NITROSTAT) 0.4 MG SL tablet Place 0.4 mg under the tongue every 5 (five) minutes as needed for chest pain.    [provider]  omeprazole (PRILOSEC) 20 MG capsule Take 20 mg by mouth daily.    [provider]  risperiDONE (RISPERDAL) 0.5 MG tablet Take 0.5 mg by mouth 2 (two) times daily.    [provider]  simvastatin (ZOCOR) 40 MG tablet Take 40 mg by mouth daily.    [provider]  vitamin B-12 (CYANOCOBALAMIN) 1000 MCG tablet Take 1,000 mcg by mouth daily.    [provider]      DRUG ALLERGIES: No Known Allergies   REVIEW OF SYSTEMS: CONSTITUTIONAL: No fatigue, weakness, fever, chills, weight gain/loss, headache EYES: No blurry or double vision. ENT: No tinnitus, postnasal drip, redness or soreness of the oropharynx. RESPIRATORY: No dyspnea, cough, wheeze, hemoptysis. CARDIOVASCULAR: Positive chest pain, negative orthopnea, palpitations, syncope. GASTROINTESTINAL: Positive epigastric abdominal pain. No nausea, vomiting, constipation, diarrhea, No hematemesis, melena or hematochezia. GENITOURINARY: No dysuria, frequency, hematuria. ENDOCRINE: No polyuria or nocturia. No heat or cold intolerance. HEMATOLOGY: No anemia, bruising, bleeding. INTEGUMENTARY: No rashes, ulcers, lesions. MUSCULOSKELETAL: No pain, arthritis, swelling, gout. NEUROLOGIC: No numbness, tingling, weakness or ataxia. No seizure-type activity. PSYCHIATRIC: No anxiety, depression, insomnia.  PHYSICAL EXAMINATION: VITAL SIGNS: Blood pressure (!) 186/105, pulse 62, temperature 98.5 F (36.9 C), temperature source Oral, resp. rate (!) 22, height 5\' 2"  (1.575 m), weight 56.7 kg (125 lb), SpO2 100 %.  GENERAL: 76 y.o.-year-old female patient, well-developed, well-nourished lying in the bed in no acute distress.  Tearful, anxious HEENT: Head atraumatic, normocephalic. Pupils equal, round, reactive to light and accommodation. No scleral icterus. Extraocular  muscles intact. Oropharynx is clear. Mucus membranes moist. NECK: Supple, full range of motion. No JVD, no bruit heard. No cervical lymphadenopathy. CHEST: Normal breath sounds bilaterally. No wheezing, rales, rhonchi or crackles. No use of accessory muscles of respiration.  No reproducible chest wall tenderness.  CARDIOVASCULAR: S1, S2 normal. No murmurs, rubs, or gallops appreciated. Cap refill <2 seconds. ABDOMEN: Soft, positive tenderness to palpation in the midepigastrium. nondistended. No rebound, guarding, rigidity. Normoactive bowel sounds present in all four quadrants. No organomegaly or mass. EXTREMITIES: Full range of motion. No pedal edema, cyanosis, or clubbing. NEUROLOGIC: Cranial nerves II through XII are grossly intact with no focal sensorimotor deficit. Muscle strength 5/5 in all extremities. Sensation intact. Gait not checked. PSYCHIATRIC: The patient is alert and oriented x 3. Normal affect, mood, thought content. SKIN: Warm, dry, and intact without obvious rash, lesion, or ulcer.  LABORATORY PANEL:  CBC  Recent Labs Lab 10/08/16 1853  WBC 6.5  HGB 12.8  HCT 37.6  PLT 154   ----------------------------------------------------------------------------------------------------------------- Chemistries  Recent Labs Lab 10/08/16 1853  NA 137  K 3.9  CL 106  CO2 23  GLUCOSE 122*  BUN 15  CREATININE 1.04*  CALCIUM 8.6*  AST 22  ALT 7*  ALKPHOS 65  BILITOT 0.4   ------------------------------------------------------------------------------------------------------------------ Cardiac Enzymes  Recent Labs  Lab 10/08/16 2147  TROPONINI 0.16*   ------------------------------------------------------------------------------------------------------------------  RADIOLOGY: Dg Chest 2 View  Result Date: 10/08/2016 CLINICAL DATA:  Pt having chest pain today. Hx of heart attack, CAD, hypertension. Former smoker EXAM: CHEST  2 VIEW COMPARISON:  07/03/2015 FINDINGS:  Cardiac silhouette is normal in size. No mediastinal or hilar masses. No evidence of adenopathy. There stable linear scarring at the left lung base. Lungs are otherwise clear. No pleural effusion or pneumothorax. Skeletal structures are demineralized. There are several chronic compression fractures along the thoracic spine. IMPRESSION: 1. No acute cardiopulmonary disease. Electronically Signed   By: Lajean Manes M.D.   On: 10/08/2016 19:29    EKG: Normal sinus rhythm at 60 bpm with normal axis and nonspecific ST-T wave changes.   IMPRESSION AND PLAN:  This is a 75 y.o. female with a history of Recent [name] colitis, Anemia, CKD2, coronary artery disease status post non-STEMI, hypertension, GERD, hypertension, hyperlipidemia, peripheral neuropathy  now being admitted with:  1. Non-ST elevation MI. Patient has multiple risk factors including ongoing tobacco use, coronary artery disease, hypertension, hyperlipidemia. We will therefore - Admit to inpatient with telemetry monitoring. - Start heparin drip, NPO  - Trend troponins, check lipids and TSH. -Continue Coreg, lisinopril, Zocor - Morphine, nitro, aspirin ordered.   - Check echo - Cardiology consult requested.   2. History of iron deficiency -Continue iron sulfate  3. History of GERD -Continue Protonix  Admission status: Inpatient, telemetry Diet/Nutrition: NPO Fluids: HL DVT Px: Heparin, SCDs and early ambulation Code Status: Full Disposition Plan: To home in <24 hours  All the records are reviewed and case discussed with ED provider. Management plans discussed with the patient and/or family who express understanding and agree with plan of care.   TOTAL TIME TAKING CARE OF THIS PATIENT: 60 minutes.   Ashaya Raftery D.O. on 10/08/2016 at 11:10 PM Between 7am to 6pm - Pager - [phone] After 6pm go to [email or web address] - Proofreader [employer] [place] [facility] Office [phone] CC: Primary care  physician; Marinda Elk, MD     Note: This dictation was prepared with Dragon dictation along with smaller phrase technology. Any transcriptional errors that result from this process are unintentional.

## 2016-10-08 NOTE — ED Notes (Signed)
ED Provider at bedside. 

## 2016-10-08 NOTE — ED Notes (Signed)
Labs drawn again.  Pt tearful.   No chest pain.  Family with pt

## 2016-10-08 NOTE — ED Notes (Signed)
meds given.  Iv started.  Family with pt.  Pt waiting on admission.

## 2016-10-08 NOTE — ED Notes (Signed)
Report off to butch rn  

## 2016-10-08 NOTE — ED Notes (Signed)
Pt reports chest pain since noon today. Pt took 2 ntg with pain relief.  cig smoker.  No n/v/d. Intermittent sob.  Pt alert.  Speech clear.  nsr on monitor.

## 2016-10-09 ENCOUNTER — Inpatient Hospital Stay (HOSPITAL_COMMUNITY)
Admit: 2016-10-09 | Discharge: 2016-10-09 | Disposition: A | Discharge status: Home / Self Care | Payer: Medicare HMO | Attending: Internal Medicine | Admitting: Internal Medicine

## 2016-10-09 ENCOUNTER — Encounter
Admission: EM | Disposition: A | Discharge status: Home / Self Care | Payer: Self-pay | Source: Home / Self Care | Attending: Internal Medicine

## 2016-10-09 ENCOUNTER — Encounter: Payer: Self-pay | Admitting: *Deleted

## 2016-10-09 DIAGNOSIS — I10 Essential (primary) hypertension: Secondary | ICD-10-CM

## 2016-10-09 DIAGNOSIS — N182 Chronic kidney disease, stage 2 (mild): Secondary | ICD-10-CM

## 2016-10-09 DIAGNOSIS — I342 Nonrheumatic mitral (valve) stenosis: Secondary | ICD-10-CM

## 2016-10-09 DIAGNOSIS — I214 Non-ST elevation (NSTEMI) myocardial infarction: Secondary | ICD-10-CM | POA: Diagnosis not present

## 2016-10-09 DIAGNOSIS — E785 Hyperlipidemia, unspecified: Secondary | ICD-10-CM

## 2016-10-09 DIAGNOSIS — I7102 Dissection of abdominal aorta: Secondary | ICD-10-CM

## 2016-10-09 DIAGNOSIS — I2510 Atherosclerotic heart disease of native coronary artery without angina pectoris: Secondary | ICD-10-CM

## 2016-10-09 HISTORY — PX: LEFT HEART CATH AND CORONARY ANGIOGRAPHY: CATH118249

## 2016-10-09 LAB — BASIC METABOLIC PANEL
Anion gap: 5 (ref 5–15)
BUN: 14 mg/dL (ref 6–20)
CHLORIDE: 107 mmol/L (ref 101–111)
CO2: 27 mmol/L (ref 22–32)
CREATININE: 0.81 mg/dL (ref 0.44–1.00)
Calcium: 8.6 mg/dL — ABNORMAL LOW (ref 8.9–10.3)
GFR calc Af Amer: >60 mL/min (ref >60)
GLUCOSE: 87 mg/dL (ref 65–99)
POTASSIUM: 4 mmol/L (ref 3.5–5.1)
Sodium: 139 mmol/L (ref 135–145)

## 2016-10-09 LAB — CBC
HCT: 38.8 % (ref 35.0–47.0)
HEMOGLOBIN: 13 g/dL (ref 12.0–16.0)
MCH: 31.8 pg (ref 26.0–34.0)
MCHC: 33.4 g/dL (ref 32.0–36.0)
MCV: 95.2 fL (ref 80.0–100.0)
PLATELETS: 147 10*3/uL — AB (ref 150–440)
RBC: 4.07 MIL/uL (ref 3.80–5.20)
RDW: 13.8 % (ref 11.5–14.5)
WBC: 6.2 10*3/uL (ref 3.6–11.0)

## 2016-10-09 LAB — LIPID PANEL
CHOL/HDL RATIO: 2.8 ratio
Cholesterol: 132 mg/dL (ref 0–200)
HDL: 48 mg/dL (ref >40)
LDL Cholesterol: 63 mg/dL (ref 0–99)
Triglycerides: 107 mg/dL (ref <150)
VLDL: 21 mg/dL (ref 0–40)

## 2016-10-09 LAB — PROTIME-INR
INR: 1.11
PROTHROMBIN TIME: 14.3 s (ref 11.4–15.2)

## 2016-10-09 LAB — TROPONIN I
TROPONIN I: 1.57 ng/mL — AB (ref <0.03)
TROPONIN I: 2.5 ng/mL — AB (ref <0.03)
Troponin I: 3.67 ng/mL (ref <0.03)

## 2016-10-09 SURGERY — LEFT HEART CATH AND CORONARY ANGIOGRAPHY
Anesthesia: Moderate Sedation

## 2016-10-09 MED ORDER — RISPERIDONE 0.5 MG PO TABS
0.5000 mg | ORAL_TABLET | Freq: Two times a day (BID) | ORAL | Status: DC
  Administered 2016-10-09 – 2016-10-10 (×2): 0.5 mg via ORAL
  Filled 2016-10-09 (×2): qty 1

## 2016-10-09 MED ORDER — HEPARIN (PORCINE) IN NACL 2-0.9 UNIT/ML-% IJ SOLN
INTRAMUSCULAR | Status: AC
  Filled 2016-10-09: qty 500

## 2016-10-09 MED ORDER — SODIUM CHLORIDE 0.9 % IV SOLN
250.0000 mL | INTRAVENOUS | Status: DC | PRN
Start: 2016-10-09 — End: 2016-10-10

## 2016-10-09 MED ORDER — FENTANYL CITRATE (PF) 100 MCG/2ML IJ SOLN
INTRAMUSCULAR | Status: DC | PRN
  Administered 2016-10-09 (×2): 25 ug via INTRAVENOUS

## 2016-10-09 MED ORDER — ALPRAZOLAM 0.25 MG PO TABS: 0.2500 mg | ORAL_TABLET | Freq: Two times a day (BID) | ORAL | Status: DC | PRN

## 2016-10-09 MED ORDER — SODIUM CHLORIDE 0.9% FLUSH: 3.0000 mL | Freq: Two times a day (BID) | INTRAVENOUS | Status: DC

## 2016-10-09 MED ORDER — HEPARIN (PORCINE) IN NACL 100-0.45 UNIT/ML-% IJ SOLN
700.0000 [IU]/h | INTRAMUSCULAR | Status: DC
  Administered 2016-10-09: 700 [IU]/h via INTRAVENOUS
  Filled 2016-10-09: qty 250

## 2016-10-09 MED ORDER — ENOXAPARIN SODIUM 40 MG/0.4ML ~~LOC~~ SOLN
40.0000 mg | SUBCUTANEOUS | Status: DC
  Administered 2016-10-10: 40 mg via SUBCUTANEOUS
  Filled 2016-10-09: qty 0.4

## 2016-10-09 MED ORDER — SODIUM CHLORIDE 0.9% FLUSH
3.0000 mL | Freq: Two times a day (BID) | INTRAVENOUS | Status: DC
  Administered 2016-10-10: 3 mL via INTRAVENOUS

## 2016-10-09 MED ORDER — CLOPIDOGREL BISULFATE 75 MG PO TABS
75.0000 mg | ORAL_TABLET | Freq: Every day | ORAL | Status: DC
  Administered 2016-10-10: 75 mg via ORAL
  Filled 2016-10-09: qty 1

## 2016-10-09 MED ORDER — PANTOPRAZOLE SODIUM 40 MG PO TBEC
40.0000 mg | DELAYED_RELEASE_TABLET | Freq: Every day | ORAL | Status: DC
  Administered 2016-10-09 – 2016-10-10 (×2): 40 mg via ORAL
  Filled 2016-10-09 (×2): qty 1

## 2016-10-09 MED ORDER — MIDAZOLAM HCL 2 MG/2ML IJ SOLN
INTRAMUSCULAR | Status: AC
  Filled 2016-10-09: qty 2

## 2016-10-09 MED ORDER — SIMVASTATIN 40 MG PO TABS: 40.0000 mg | ORAL_TABLET | Freq: Every day | ORAL | Status: DC

## 2016-10-09 MED ORDER — SODIUM CHLORIDE 0.9 % IV SOLN: 250.0000 mL | INTRAVENOUS | Status: DC | PRN

## 2016-10-09 MED ORDER — CLOPIDOGREL BISULFATE 75 MG PO TABS
300.0000 mg | ORAL_TABLET | Freq: Once | ORAL | Status: AC
  Administered 2016-10-09: 300 mg via ORAL
  Filled 2016-10-09: qty 4

## 2016-10-09 MED ORDER — ATORVASTATIN CALCIUM 20 MG PO TABS
40.0000 mg | ORAL_TABLET | Freq: Every day | ORAL | Status: DC
  Administered 2016-10-09: 40 mg via ORAL
  Filled 2016-10-09: qty 2

## 2016-10-09 MED ORDER — ACETAMINOPHEN 325 MG PO TABS
650.0000 mg | ORAL_TABLET | ORAL | Status: DC | PRN
  Administered 2016-10-09 – 2016-10-10 (×3): 650 mg via ORAL
  Filled 2016-10-09 (×3): qty 2

## 2016-10-09 MED ORDER — SODIUM CHLORIDE 0.9 % WEIGHT BASED INFUSION: 1.0000 mL/kg/h | INTRAVENOUS | Status: DC

## 2016-10-09 MED ORDER — SODIUM CHLORIDE 0.9% FLUSH: 3.0000 mL | INTRAVENOUS | Status: DC | PRN

## 2016-10-09 MED ORDER — SODIUM CHLORIDE 0.9 % WEIGHT BASED INFUSION
3.0000 mL/kg/h | INTRAVENOUS | Status: DC
  Administered 2016-10-09: 3 mL/kg/h via INTRAVENOUS

## 2016-10-09 MED ORDER — FERROUS SULFATE 325 (65 FE) MG PO TABS
325.0000 mg | ORAL_TABLET | Freq: Every day | ORAL | Status: DC
  Administered 2016-10-09 – 2016-10-10 (×2): 325 mg via ORAL
  Filled 2016-10-09 (×2): qty 1

## 2016-10-09 MED ORDER — NITROGLYCERIN 2 % TD OINT: 0.5000 [in_us] | TOPICAL_OINTMENT | Freq: Four times a day (QID) | TRANSDERMAL | Status: DC

## 2016-10-09 MED ORDER — SODIUM CHLORIDE 0.9 % IV SOLN
INTRAVENOUS | Status: AC
  Administered 2016-10-09: 15:00:00 via INTRAVENOUS

## 2016-10-09 MED ORDER — NITROGLYCERIN 0.4 MG SL SUBL: 0.4000 mg | SUBLINGUAL_TABLET | SUBLINGUAL | Status: DC | PRN

## 2016-10-09 MED ORDER — VERAPAMIL HCL 2.5 MG/ML IV SOLN
INTRAVENOUS | Status: AC
  Filled 2016-10-09: qty 2

## 2016-10-09 MED ORDER — IOPAMIDOL (ISOVUE-300) INJECTION 61%
INTRAVENOUS | Status: DC | PRN
  Administered 2016-10-09: 70 mL via INTRA_ARTERIAL

## 2016-10-09 MED ORDER — ZOLPIDEM TARTRATE 5 MG PO TABS: 5.0000 mg | ORAL_TABLET | Freq: Every evening | ORAL | Status: DC | PRN

## 2016-10-09 MED ORDER — ONDANSETRON HCL 4 MG/2ML IJ SOLN: 4.0000 mg | Freq: Four times a day (QID) | INTRAMUSCULAR | Status: DC | PRN

## 2016-10-09 MED ORDER — ACETAMINOPHEN 500 MG PO TABS: 500.0000 mg | ORAL_TABLET | Freq: Four times a day (QID) | ORAL | Status: DC | PRN

## 2016-10-09 MED ORDER — HEPARIN SODIUM (PORCINE) 1000 UNIT/ML IJ SOLN
INTRAMUSCULAR | Status: DC | PRN
  Administered 2016-10-09: 3000 [IU] via INTRAVENOUS

## 2016-10-09 MED ORDER — ASPIRIN EC 81 MG PO TBEC
81.0000 mg | DELAYED_RELEASE_TABLET | Freq: Every day | ORAL | Status: DC
  Administered 2016-10-09 – 2016-10-10 (×2): 81 mg via ORAL
  Filled 2016-10-09 (×2): qty 1

## 2016-10-09 MED ORDER — CARVEDILOL 6.25 MG PO TABS
6.2500 mg | ORAL_TABLET | Freq: Two times a day (BID) | ORAL | Status: DC
  Administered 2016-10-09 – 2016-10-10 (×3): 6.25 mg via ORAL
  Filled 2016-10-09 (×3): qty 1

## 2016-10-09 MED ORDER — LISINOPRIL 20 MG PO TABS
40.0000 mg | ORAL_TABLET | Freq: Every day | ORAL | Status: DC
  Administered 2016-10-09 – 2016-10-10 (×2): 40 mg via ORAL
  Filled 2016-10-09 (×2): qty 2

## 2016-10-09 MED ORDER — ISOSORBIDE MONONITRATE ER 30 MG PO TB24
30.0000 mg | ORAL_TABLET | Freq: Every day | ORAL | Status: DC
  Administered 2016-10-09 – 2016-10-10 (×2): 30 mg via ORAL
  Filled 2016-10-09 (×2): qty 1

## 2016-10-09 MED ORDER — HEPARIN SODIUM (PORCINE) 1000 UNIT/ML IJ SOLN
INTRAMUSCULAR | Status: AC
  Filled 2016-10-09: qty 1

## 2016-10-09 MED ORDER — NICOTINE 10 MG IN INHA: 1.0000 | RESPIRATORY_TRACT | Status: DC | PRN

## 2016-10-09 MED ORDER — MIDAZOLAM HCL 2 MG/2ML IJ SOLN
INTRAMUSCULAR | Status: DC | PRN
  Administered 2016-10-09: 0.5 mg via INTRAVENOUS

## 2016-10-09 MED ORDER — NICOTINE POLACRILEX 2 MG MT GUM
2.0000 mg | CHEWING_GUM | OROMUCOSAL | Status: DC | PRN
  Filled 2016-10-09: qty 1

## 2016-10-09 MED ORDER — VITAMIN B-12 1000 MCG PO TABS
1000.0000 ug | ORAL_TABLET | Freq: Every day | ORAL | Status: DC
  Administered 2016-10-09 – 2016-10-10 (×2): 1000 ug via ORAL
  Filled 2016-10-09 (×2): qty 1

## 2016-10-09 MED ORDER — FENTANYL CITRATE (PF) 100 MCG/2ML IJ SOLN
INTRAMUSCULAR | Status: AC
  Filled 2016-10-09: qty 2

## 2016-10-09 MED ORDER — HEPARIN BOLUS VIA INFUSION
3400.0000 [IU] | Freq: Once | INTRAVENOUS | Status: AC
  Administered 2016-10-09: 3400 [IU] via INTRAVENOUS
  Filled 2016-10-09: qty 3400

## 2016-10-09 MED ORDER — VERAPAMIL HCL 2.5 MG/ML IV SOLN
INTRAVENOUS | Status: DC | PRN
  Administered 2016-10-09: 2.5 mg via INTRA_ARTERIAL

## 2016-10-09 SURGICAL SUPPLY — 9 items
CATH INFINITI 5 FR JL3.5 (CATHETERS) ×2 IMPLANT
CATH INFINITI 5FR ANG PIGTAIL (CATHETERS) ×2 IMPLANT
CATH INFINITI JR4 5F (CATHETERS) ×2 IMPLANT
DEVICE RAD TR BAND REGULAR (VASCULAR PRODUCTS) ×2 IMPLANT
GLIDESHEATH SLEND SS 6F .021 (SHEATH) ×2 IMPLANT
KIT MANI 3VAL PERCEP (MISCELLANEOUS) ×2 IMPLANT
PACK CARDIAC CATH (CUSTOM PROCEDURE TRAY) ×2 IMPLANT
WIRE HITORQ VERSACORE ST 145CM (WIRE) ×2 IMPLANT
WIRE ROSEN-J .035X260CM (WIRE) ×2 IMPLANT

## 2016-10-09 NOTE — H&P (View-Only) (Signed)
Cardiology Consultation Note  Patient ID: Cheryl Hall, MRN: [id number], DOB/AGE: Oct 06, 1940 76 y.o. Admit date: 10/08/2016   Date of Consult: 10/09/2016 Primary Physician: Marinda Elk, MD Primary Cardiologist: Dr. Fletcher Anon, MD Requesting Physician: Dr. Ara Kussmaul, DO  Chief Complaint: Chest pain Reason for Consult: NSTEMI  HPI: Cheryl Hall is a 76 y.o. female who is being seen today for the evaluation of NSTEMI at the request of Dr. Ara Kussmaul, DO. 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 Lucky Cowboy with medical management advised currently, uncontrolled HTN, prior tobacco abuse, and HLD who presented to [facility] with a one-day history of lower chest to epigastric pain and was found to have a NSTEMI.  Prior admission in 09/2014 for small NSTEMI in the setting of uncontrolled HTN. LHC at that time showed moderate two-vessel CAD involving the LAD and RCA as below. EF was normal. Elevated troponin was felt to be 2/2 high BP. She was most recently admitted to [facility] in 07/2016 for abdominal pain with diarrhea. She was found to have C diff colitis that was complicated by mild anemia with a stable hgb. At that time her ASA was stopped. She was also incidentally noted to have a mild infrarenal aortic dissection. She followed up with Dr. Lucky Cowboy who felt medical management was best advised at that time with close follow up with him.   Patient was in her usual state of health until 10/08/16 when she developed lower, substernal chest pain/epigastric pain. Pain lasted approximately 1 hour and improved with SL NTG. She again had another episode later in the day that lasted approximately 1 hour and too improved with SL NTG x 1. However, she continued to "not feel right." She called her son who recommended she come to the ED.  There was some associated SOB and nausea. No associated palpitations, dizziness, presyncope, or syncope.   Of note, patient did suffer a mechanical fall over the weekend by slipping on some wet grass.   Upon the patient's arrival to [facility] they were found to have BP 630/16-->010 XNATFTDD-->220 systolic, HR 70 bpm, temp 98.5, oxygen saturation 97% on rrom air, weight 125 pounds. EKG showed sinus rhythm, 69 bpm, TWI leads III, aVF, V3, CXR showed no acute cardiopulmonary disease. Labs showed troponin 0.03-->0.16-->1.57-->3.67. CBC unremarkable. SCr 1.04-->0.81, K+ 3.9-->4.0, lipase 36. She was started on a heparin gtt, given ASA 324 mg, and nitro paste was applied in the ED. She has remained chest pain free since prior to her contacting EMS. She continues to note an "uneasy feeling."  Past Medical History:  Diagnosis Date  . Anemia   . Chronic back pain   . CKD (chronic kidney disease), stage II   . Coronary artery disease    moderate 2-vessel disease  . Diverticulitis   . Essential hypertension   . GERD (gastroesophageal reflux disease)   . H/O Vertebral Fractures   . History of mania   . History of sciatica   . Hyperlipidemia   . Hypertension   . Peripheral neuropathy   . Tobacco abuse       Most Recent Cardiac Studies: LHC 09/2014: Conclusion    Mid RCA lesion, 20% stenosed.  LM lesion, 20% stenosed.  Mid LAD lesion, 40% stenosed.  Dist LAD lesion, 30% stenosed.   1. Mild to moderate  calcified nonobstructive coronary artery disease. 2. Normal LV systolic function. Mildly elevated left ventricular end-diastolic pressure.  Recommendations: Recommend medical therapy. The patient can be discharged home from a cardiac standpoint.     Surgical History:  Past Surgical History:  Procedure Laterality Date  . CARDIAC CATHETERIZATION N/A 10/21/2014   Procedure: Left Heart Cath and Coronary Angiography;  Surgeon: Wellington Hampshire, MD;  Location: [place] [facility];  Service:  Cardiovascular;  Laterality: N/A;  . CARDIAC CATHETERIZATION  10/21/2014   Dr. Fletcher Anon  . CHOLECYSTECTOMY       Home Meds: Prior to Admission medications   Medication Sig Start Date End Date Taking? Authorizing Provider  acetaminophen (TYLENOL) 500 MG tablet Take 500 mg by mouth every 6 (six) hours as needed for mild pain or headache.     [provider]  carvedilol (COREG) 6.25 MG tablet TAKE ONE TABLET BY MOUTH TWICE DAILY WITH MEALS 08/30/16   Minna Merritts, MD  Cholecalciferol (VITAMIN D-3 PO) Take 1 tablet by mouth daily.    [provider]  ferrous sulfate 325 (65 FE) MG tablet Take 325 mg by mouth daily.    [provider]  lisinopril (PRINIVIL,ZESTRIL) 40 MG tablet TAKE ONE TABLET BY MOUTH ONCE DAILY 04/10/16   Wellington Hampshire, MD  nicotine (NICOTROL) 10 MG inhaler Inhale 1 Cartridge (1 continuous puffing total) into the lungs as needed for smoking cessation. 08/21/16   Dustin Flock, MD  nitroGLYCERIN (NITROSTAT) 0.4 MG SL tablet Place 0.4 mg under the tongue every 5 (five) minutes as needed for chest pain.    [provider]  omeprazole (PRILOSEC) 20 MG capsule Take 20 mg by mouth daily.    [provider]  risperiDONE (RISPERDAL) 0.5 MG tablet Take 0.5 mg by mouth 2 (two) times daily.    [provider]  simvastatin (ZOCOR) 40 MG tablet Take 40 mg by mouth daily.    [provider]  vitamin B-12 (CYANOCOBALAMIN) 1000 MCG tablet Take 1,000 mcg by mouth daily.    [provider]    Inpatient Medications:  . aspirin EC  81 mg Oral Daily  . carvedilol  6.25 mg Oral BID WC  . ferrous sulfate  325 mg Oral Daily  . lisinopril  40 mg Oral Daily  . nitroGLYCERIN  0.5 inch Topical Q6H  . pantoprazole  40 mg Oral Daily  . risperiDONE  0.5 mg Oral BID  . simvastatin  40 mg Oral Daily  . sodium chloride flush  3 mL Intravenous Q12H  . vitamin B-12  1,000 mcg Oral Daily   . sodium chloride    . [START ON  10/10/2016] sodium chloride     Followed by  . [START ON 10/10/2016] sodium chloride    . heparin 700 Units/hr (10/09/16 0152)    Allergies: No Known Allergies  Social History   Social History  . Marital status: Divorced    Spouse name: N/A  . Number of children: N/A  . Years of education: N/A   Occupational History  . Not on file.   Social History Main Topics  . Smoking status: Current Some Day Smoker    Packs/day: 0.25    Years: 50.00    Types: Cigarettes  . Smokeless tobacco: Never Used     Comment: Smoking 2 cigarettes every other day.   . Alcohol use No  . Drug use: No  . Sexual activity: Not on file   Other Topics Concern  . Not  on file   Social History Narrative   Lives in [place] by herself.  Does not routinely exercise.  Son nearby.     Family History  Problem Relation Age of Onset  . Other Mother        died @ 17.  . Cancer Father        died of pancreatic cancer  . CAD Brother        alive in late 61's w/ h/o stenting.  . Breast cancer Neg Hx      Review of Systems: Review of Systems  Constitutional: Positive for malaise/fatigue. Negative for chills, diaphoresis, fever and weight loss.  HENT: Negative for congestion.   Eyes: Negative for discharge and redness.  Respiratory: Positive for shortness of breath. Negative for cough, hemoptysis, sputum production and wheezing.   Cardiovascular: Positive for chest pain. Negative for palpitations, orthopnea, claudication, leg swelling and PND.  Gastrointestinal: Positive for nausea. Negative for abdominal pain, blood in stool, heartburn, melena and vomiting.  Genitourinary: Negative for hematuria.  Musculoskeletal: Negative for falls and myalgias.  Skin: Negative for rash.  Neurological: Positive for weakness. Negative for dizziness, tingling, tremors, sensory change, speech change, focal weakness and loss of consciousness.  Endo/Heme/Allergies: Does not bruise/bleed easily.  Psychiatric/Behavioral:  Negative for substance abuse. The patient is not nervous/anxious.   All other systems reviewed and are negative.   Labs:  Recent Labs  10/08/16 1853 10/08/16 2147 10/09/16 0104 10/09/16 0640  TROPONINI <0.03 0.16* 1.57* 3.67*   Lab Results  Component Value Date   WBC 6.2 10/09/2016   HGB 13.0 10/09/2016   HCT 38.8 10/09/2016   MCV 95.2 10/09/2016   PLT 147 (L) 10/09/2016     Recent Labs Lab 10/08/16 1853 10/09/16 0640  NA 137 139  K 3.9 4.0  CL 106 107  CO2 23 27  BUN 15 14  CREATININE 1.04* 0.81  CALCIUM 8.6* 8.6*  PROT 6.1*  --   BILITOT 0.4  --   ALKPHOS 65  --   ALT 7*  --   AST 22  --   GLUCOSE 122* 87   Lab Results  Component Value Date   CHOL 132 10/09/2016   HDL 48 10/09/2016   LDLCALC 63 10/09/2016   TRIG 107 10/09/2016   No results found for: DDIMER  Radiology/Studies:  Dg Chest 2 View  Result Date: 10/08/2016 CLINICAL DATA:  Pt having chest pain today. Hx of heart attack, CAD, hypertension. Former smoker EXAM: CHEST  2 VIEW COMPARISON:  07/03/2015 FINDINGS: Cardiac silhouette is normal in size. No mediastinal or hilar masses. No evidence of adenopathy. There stable linear scarring at the left lung base. Lungs are otherwise clear. No pleural effusion or pneumothorax. Skeletal structures are demineralized. There are several chronic compression fractures along the thoracic spine. IMPRESSION: 1. No acute cardiopulmonary disease. Electronically Signed   By: Lajean Manes M.D.   On: 10/08/2016 19:29    EKG: Interpreted by me showed: NSR, 69 bpm, rare PACs, TWI leads III, aVF, V3 Telemetry: Interpreted by me showed: NSR, 70s bpm  Weights: Filed Weights   10/08/16 1856 10/09/16 0046  Weight: 125 lb (56.7 kg) 123 lb 9.6 oz (56.1 kg)     Physical Exam: Blood pressure (!) 145/77, pulse 66, temperature 98.3 F (36.8 C), temperature source Oral, resp. rate 16, height 5\' 2"  (1.575 m), weight 123 lb 9.6 oz (56.1 kg), SpO2 95 %. Body mass index is 22.61  kg/m. General: Well developed, well nourished, in  no acute distress. Head: Normocephalic, atraumatic, sclera non-icteric, no xanthomas, nares are without discharge. HOH. Neck: Negative for carotid bruits. JVD not elevated. Lungs: Clear bilaterally to auscultation without wheezes, rales, or rhonchi. Breathing is unlabored. Heart: RRR with S1 S2. No murmurs, rubs, or gallops appreciated. Abdomen: Soft, non-tender, non-distended with normoactive bowel sounds. No hepatomegaly. No rebound/guarding. No obvious abdominal masses. Msk:  Strength and tone appear normal for age. Extremities: No clubbing or cyanosis. No edema. Distal pedal pulses are 2+ and equal bilaterally. Neuro: Alert and oriented X 3. No facial asymmetry. No focal deficit. Moves all extremities spontaneously. Psych:  Responds to questions appropriately with a normal affect.    Assessment and Plan:  Principal Problem:   NSTEMI (non-ST elevated myocardial infarction) ([place]) Active Problems:   Hyperlipidemia   Essential hypertension   Tobacco abuse   CKD (chronic kidney disease), stage II   GERD (gastroesophageal reflux disease)   CAD in native artery   Aortic dissection ([place])    1. NSTEMI/CAD: -Currently chest pain free -Troponin currently 3.67, continue to trend until peak -Heparin gtt -FOr LHC today -Risks and benefits of cardiac catheterization have been discussed with the patient including risks of bleeding, bruising, infection, kidney damage, stroke, heart attack, and death. The patient understands these risks and is willing to proceed with the procedure. All questions have been answered and concerns listened to -ASA, Coreg, lisinopril -Change simvastatin to Lipitor -TTE  2. History of mild aortic dissection: -Followed by Dr. Lucky Cowboy -Hemodynamically stable  3. CKD stage II: -Stable -Monitor s/p LHC  4. Uncontrolled HTN: -Does not check her BP at home, perhaps she should start checking daily -Improved with  current regimen, continue   5. HLD: -Check lipid panel -Change simvastatin to Lipitor    Signed, Christell Faith, PA-C [facility] Pager: [phone] 10/09/2016, 9:56 AM

## 2016-10-09 NOTE — Consult Note (Signed)
Cardiology Consultation Note  Patient ID: Cheryl Hall, MRN: [id number], DOB/AGE: 02/22/1941 76 y.o. Admit date: 10/08/2016   Date of Consult: 10/09/2016 Primary Physician: Marinda Elk, MD Primary Cardiologist: Dr. Fletcher Anon, MD Requesting Physician: Dr. Ara Kussmaul, DO  Chief Complaint: Chest pain Reason for Consult: NSTEMI  HPI: Cheryl Hall is a 76 y.o. female who is being seen today for the evaluation of NSTEMI at the request of Dr. Ara Kussmaul, DO. 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 Lucky Cowboy with medical management advised currently, uncontrolled HTN, prior tobacco abuse, and HLD who presented to [facility] with a one-day history of lower chest to epigastric pain and was found to have a NSTEMI.  Prior admission in 09/2014 for small NSTEMI in the setting of uncontrolled HTN. LHC at that time showed moderate two-vessel CAD involving the LAD and RCA as below. EF was normal. Elevated troponin was felt to be 2/2 high BP. She was most recently admitted to [facility] in 07/2016 for abdominal pain with diarrhea. She was found to have C diff colitis that was complicated by mild anemia with a stable hgb. At that time her ASA was stopped. She was also incidentally noted to have a mild infrarenal aortic dissection. She followed up with Dr. Lucky Cowboy who felt medical management was best advised at that time with close follow up with him.   Patient was in her usual state of health until 10/08/16 when she developed lower, substernal chest pain/epigastric pain. Pain lasted approximately 1 hour and improved with SL NTG. She again had another episode later in the day that lasted approximately 1 hour and too improved with SL NTG x 1. However, she continued to "not feel right." She called her son who recommended she come to the ED.  There was some associated SOB and nausea. No associated palpitations, dizziness, presyncope, or syncope.   Of note, patient did suffer a mechanical fall over the weekend by slipping on some wet grass.   Upon the patient's arrival to [facility] they were found to have BP 810/17-->510 CHENIDPO-->242 systolic, HR 70 bpm, temp 98.5, oxygen saturation 97% on rrom air, weight 125 pounds. EKG showed sinus rhythm, 69 bpm, TWI leads III, aVF, V3, CXR showed no acute cardiopulmonary disease. Labs showed troponin 0.03-->0.16-->1.57-->3.67. CBC unremarkable. SCr 1.04-->0.81, K+ 3.9-->4.0, lipase 36. She was started on a heparin gtt, given ASA 324 mg, and nitro paste was applied in the ED. She has remained chest pain free since prior to her contacting EMS. She continues to note an "uneasy feeling."  Past Medical History:  Diagnosis Date  . Anemia   . Chronic back pain   . CKD (chronic kidney disease), stage II   . Coronary artery disease    moderate 2-vessel disease  . Diverticulitis   . Essential hypertension   . GERD (gastroesophageal reflux disease)   . H/O Vertebral Fractures   . History of mania   . History of sciatica   . Hyperlipidemia   . Hypertension   . Peripheral neuropathy   . Tobacco abuse       Most Recent Cardiac Studies: LHC 09/2014: Conclusion    Mid RCA lesion, 20% stenosed.  LM lesion, 20% stenosed.  Mid LAD lesion, 40% stenosed.  Dist LAD lesion, 30% stenosed.   1. Mild to moderate  calcified nonobstructive coronary artery disease. 2. Normal LV systolic function. Mildly elevated left ventricular end-diastolic pressure.  Recommendations: Recommend medical therapy. The patient can be discharged home from a cardiac standpoint.     Surgical History:  Past Surgical History:  Procedure Laterality Date  . CARDIAC CATHETERIZATION N/A 10/21/2014   Procedure: Left Heart Cath and Coronary Angiography;  Surgeon: Wellington Hampshire, MD;  Location: [place] [facility];  Service:  Cardiovascular;  Laterality: N/A;  . CARDIAC CATHETERIZATION  10/21/2014   Dr. Fletcher Anon  . CHOLECYSTECTOMY       Home Meds: Prior to Admission medications   Medication Sig Start Date End Date Taking? Authorizing Provider  acetaminophen (TYLENOL) 500 MG tablet Take 500 mg by mouth every 6 (six) hours as needed for mild pain or headache.     [provider]  carvedilol (COREG) 6.25 MG tablet TAKE ONE TABLET BY MOUTH TWICE DAILY WITH MEALS 08/30/16   Minna Merritts, MD  Cholecalciferol (VITAMIN D-3 PO) Take 1 tablet by mouth daily.    [provider]  ferrous sulfate 325 (65 FE) MG tablet Take 325 mg by mouth daily.    [provider]  lisinopril (PRINIVIL,ZESTRIL) 40 MG tablet TAKE ONE TABLET BY MOUTH ONCE DAILY 04/10/16   Wellington Hampshire, MD  nicotine (NICOTROL) 10 MG inhaler Inhale 1 Cartridge (1 continuous puffing total) into the lungs as needed for smoking cessation. 08/21/16   Dustin Flock, MD  nitroGLYCERIN (NITROSTAT) 0.4 MG SL tablet Place 0.4 mg under the tongue every 5 (five) minutes as needed for chest pain.    [provider]  omeprazole (PRILOSEC) 20 MG capsule Take 20 mg by mouth daily.    [provider]  risperiDONE (RISPERDAL) 0.5 MG tablet Take 0.5 mg by mouth 2 (two) times daily.    [provider]  simvastatin (ZOCOR) 40 MG tablet Take 40 mg by mouth daily.    [provider]  vitamin B-12 (CYANOCOBALAMIN) 1000 MCG tablet Take 1,000 mcg by mouth daily.    [provider]    Inpatient Medications:  . aspirin EC  81 mg Oral Daily  . carvedilol  6.25 mg Oral BID WC  . ferrous sulfate  325 mg Oral Daily  . lisinopril  40 mg Oral Daily  . nitroGLYCERIN  0.5 inch Topical Q6H  . pantoprazole  40 mg Oral Daily  . risperiDONE  0.5 mg Oral BID  . simvastatin  40 mg Oral Daily  . sodium chloride flush  3 mL Intravenous Q12H  . vitamin B-12  1,000 mcg Oral Daily   . sodium chloride    . [START ON  10/10/2016] sodium chloride     Followed by  . [START ON 10/10/2016] sodium chloride    . heparin 700 Units/hr (10/09/16 0152)    Allergies: No Known Allergies  Social History   Social History  . Marital status: Divorced    Spouse name: N/A  . Number of children: N/A  . Years of education: N/A   Occupational History  . Not on file.   Social History Main Topics  . Smoking status: Current Some Day Smoker    Packs/day: 0.25    Years: 50.00    Types: Cigarettes  . Smokeless tobacco: Never Used     Comment: Smoking 2 cigarettes every other day.   . Alcohol use No  . Drug use: No  . Sexual activity: Not on file   Other Topics Concern  . Not  on file   Social History Narrative   Lives in [place] by herself.  Does not routinely exercise.  Son nearby.     Family History  Problem Relation Age of Onset  . Other Mother        died @ 5.  . Cancer Father        died of pancreatic cancer  . CAD Brother        alive in late 98's w/ h/o stenting.  . Breast cancer Neg Hx      Review of Systems: Review of Systems  Constitutional: Positive for malaise/fatigue. Negative for chills, diaphoresis, fever and weight loss.  HENT: Negative for congestion.   Eyes: Negative for discharge and redness.  Respiratory: Positive for shortness of breath. Negative for cough, hemoptysis, sputum production and wheezing.   Cardiovascular: Positive for chest pain. Negative for palpitations, orthopnea, claudication, leg swelling and PND.  Gastrointestinal: Positive for nausea. Negative for abdominal pain, blood in stool, heartburn, melena and vomiting.  Genitourinary: Negative for hematuria.  Musculoskeletal: Negative for falls and myalgias.  Skin: Negative for rash.  Neurological: Positive for weakness. Negative for dizziness, tingling, tremors, sensory change, speech change, focal weakness and loss of consciousness.  Endo/Heme/Allergies: Does not bruise/bleed easily.  Psychiatric/Behavioral:  Negative for substance abuse. The patient is not nervous/anxious.   All other systems reviewed and are negative.   Labs:  Recent Labs  10/08/16 1853 10/08/16 2147 10/09/16 0104 10/09/16 0640  TROPONINI <0.03 0.16* 1.57* 3.67*   Lab Results  Component Value Date   WBC 6.2 10/09/2016   HGB 13.0 10/09/2016   HCT 38.8 10/09/2016   MCV 95.2 10/09/2016   PLT 147 (L) 10/09/2016     Recent Labs Lab 10/08/16 1853 10/09/16 0640  NA 137 139  K 3.9 4.0  CL 106 107  CO2 23 27  BUN 15 14  CREATININE 1.04* 0.81  CALCIUM 8.6* 8.6*  PROT 6.1*  --   BILITOT 0.4  --   ALKPHOS 65  --   ALT 7*  --   AST 22  --   GLUCOSE 122* 87   Lab Results  Component Value Date   CHOL 132 10/09/2016   HDL 48 10/09/2016   LDLCALC 63 10/09/2016   TRIG 107 10/09/2016   No results found for: DDIMER  Radiology/Studies:  Dg Chest 2 View  Result Date: 10/08/2016 CLINICAL DATA:  Pt having chest pain today. Hx of heart attack, CAD, hypertension. Former smoker EXAM: CHEST  2 VIEW COMPARISON:  07/03/2015 FINDINGS: Cardiac silhouette is normal in size. No mediastinal or hilar masses. No evidence of adenopathy. There stable linear scarring at the left lung base. Lungs are otherwise clear. No pleural effusion or pneumothorax. Skeletal structures are demineralized. There are several chronic compression fractures along the thoracic spine. IMPRESSION: 1. No acute cardiopulmonary disease. Electronically Signed   By: Lajean Manes M.D.   On: 10/08/2016 19:29    EKG: Interpreted by me showed: NSR, 69 bpm, rare PACs, TWI leads III, aVF, V3 Telemetry: Interpreted by me showed: NSR, 70s bpm  Weights: Filed Weights   10/08/16 1856 10/09/16 0046  Weight: 125 lb (56.7 kg) 123 lb 9.6 oz (56.1 kg)     Physical Exam: Blood pressure (!) 145/77, pulse 66, temperature 98.3 F (36.8 C), temperature source Oral, resp. rate 16, height 5\' 2"  (1.575 m), weight 123 lb 9.6 oz (56.1 kg), SpO2 95 %. Body mass index is 22.61  kg/m. General: Well developed, well nourished, in  no acute distress. Head: Normocephalic, atraumatic, sclera non-icteric, no xanthomas, nares are without discharge. HOH. Neck: Negative for carotid bruits. JVD not elevated. Lungs: Clear bilaterally to auscultation without wheezes, rales, or rhonchi. Breathing is unlabored. Heart: RRR with S1 S2. No murmurs, rubs, or gallops appreciated. Abdomen: Soft, non-tender, non-distended with normoactive bowel sounds. No hepatomegaly. No rebound/guarding. No obvious abdominal masses. Msk:  Strength and tone appear normal for age. Extremities: No clubbing or cyanosis. No edema. Distal pedal pulses are 2+ and equal bilaterally. Neuro: Alert and oriented X 3. No facial asymmetry. No focal deficit. Moves all extremities spontaneously. Psych:  Responds to questions appropriately with a normal affect.    Assessment and Plan:  Principal Problem:   NSTEMI (non-ST elevated myocardial infarction) ([place]) Active Problems:   Hyperlipidemia   Essential hypertension   Tobacco abuse   CKD (chronic kidney disease), stage II   GERD (gastroesophageal reflux disease)   CAD in native artery   Aortic dissection ([place])    1. NSTEMI/CAD: -Currently chest pain free -Troponin currently 3.67, continue to trend until peak -Heparin gtt -FOr LHC today -Risks and benefits of cardiac catheterization have been discussed with the patient including risks of bleeding, bruising, infection, kidney damage, stroke, heart attack, and death. The patient understands these risks and is willing to proceed with the procedure. All questions have been answered and concerns listened to -ASA, Coreg, lisinopril -Change simvastatin to Lipitor -TTE  2. History of mild aortic dissection: -Followed by Dr. Lucky Cowboy -Hemodynamically stable  3. CKD stage II: -Stable -Monitor s/p LHC  4. Uncontrolled HTN: -Does not check her BP at home, perhaps she should start checking daily -Improved with  current regimen, continue   5. HLD: -Check lipid panel -Change simvastatin to Lipitor    Signed, Christell Faith, PA-C [facility] Pager: [phone] 10/09/2016, 9:56 AM

## 2016-10-09 NOTE — Interval H&P Note (Signed)
History and Physical Interval Note:  10/09/2016 10:27 AM  Cheryl Hall  has presented today for cardiac catheterization, with the diagnosis of NSTEMI. The various methods of treatment have been discussed with the patient and family. After consideration of risks, benefits and other options for treatment, the patient has consented to  Procedure(s): Left Heart Cath and Coronary Angiography (N/A) as a surgical intervention .  The patient's history has been reviewed, patient examined, no change in status, stable for surgery.  I have reviewed the patient's chart and labs.  Questions were answered to the patient's satisfaction.    Cath Lab Visit (complete for each Cath Lab visit)  Clinical Evaluation Leading to the Procedure:   ACS: Yes.   (NSTEMI)  Non-ACS:  N/A  Delyla Sandeen

## 2016-10-09 NOTE — Progress Notes (Signed)
Chaplain received a page to visit with pt in room 253. Pt was a few hours from going in to surgery. Pt was nervous about the surgery and needed support from the [place]. Pt requested prayer that the surgery would go well. Chaplain provided the ministry of Prayer and a Pastoral presence.    10/09/16 0958  Clinical Encounter Type  Visited With Patient  Visit Type Initial;Spiritual support  Referral From Nurse  Consult/Referral To Chaplain  Spiritual Encounters  Spiritual Needs Prayer

## 2016-10-09 NOTE — Progress Notes (Signed)
[facility] at [place] NAME: Cheryl Hall    MR#:  [id number]  DATE OF BIRTH:  1941/02/21  SUBJECTIVE:  CHIEF COMPLAINT:   Chief Complaint  Patient presents with  . Chest Pain  The patient is 76 year old Caucasian female with past medical history significant for history of [name], anemia, CAD, stage II, coronary artery disease, status post non-STEMI, hypertension, hyperlipidemia, peripheral neuropathy, who presented to the hospital with complaints of chest pain associated with nausea, diaphoresis, palpitations. Pain was localized in central chest area and occurred at rest. Initial cardiac enzymes were negative, however, subsequent revealed elevation, maximum level of 3.67. Patient was seen by cardiologist, who recommended cardiac catheterization, which revealed moderate, non-critical coronary artery disease involving the distal LMCA, LAD, and RCA. No culprit lesion is evident for patient's NSTEMI. Normal left ventricular systolic function and filling pressure. Patient feels comfortable today and she was seen and cardiac catheterization lab, she is somewhat somnolent and not able to provide review of systems    Review of Systems  Unable to perform ROS: Mental acuity    VITAL SIGNS: Blood pressure 122/63, pulse (!) 55, temperature 98.4 F (36.9 C), temperature source Oral, resp. rate 14, height 5\' 2"  (1.575 m), weight 56.1 kg (123 lb 9.6 oz), SpO2 90 %.  PHYSICAL EXAMINATION:   GENERAL:  76 y.o.-year-old patient sitting in the bed with no acute distress, somnolent, somewhat tearful.  EYES: Pupils equal, round, reactive to light and accommodation. No scleral icterus. Extraocular muscles intact.  HEENT: Head atraumatic, normocephalic. Oropharynx and nasopharynx clear.  NECK:  Supple, no jugular venous distention. No thyroid enlargement, no tenderness.  LUNGS: Normal breath sounds bilaterally, no wheezing, rales,rhonchi or crepitation.  No use of accessory muscles of respiration.  CARDIOVASCULAR: S1, S2 normal. No murmurs, rubs, or gallops.  ABDOMEN: Soft, nontender, nondistended. Bowel sounds present. No organomegaly or mass.  EXTREMITIES: No pedal edema, cyanosis, or clubbing.  NEUROLOGIC: Cranial nerves II through XII are intact. Muscle strength 5/5 in all extremities. Sensation intact. Gait not checked.  PSYCHIATRIC: The patient is alert and oriented x 3.  SKIN: No obvious rash, lesion, or ulcer.   ORDERS/RESULTS REVIEWED:   CBC  Recent Labs Lab 10/08/16 1853 10/09/16 0640  WBC 6.5 6.2  HGB 12.8 13.0  HCT 37.6 38.8  PLT 154 147*  MCV 94.9 95.2  MCH 32.3 31.8  MCHC 34.0 33.4  RDW 14.1 13.8   ------------------------------------------------------------------------------------------------------------------  Chemistries   Recent Labs Lab 10/08/16 1853 10/09/16 0640  NA 137 139  K 3.9 4.0  CL 106 107  CO2 23 27  GLUCOSE 122* 87  BUN 15 14  CREATININE 1.04* 0.81  CALCIUM 8.6* 8.6*  AST 22  --   ALT 7*  --   ALKPHOS 65  --   BILITOT 0.4  --    ------------------------------------------------------------------------------------------------------------------ estimated creatinine clearance is 48.9 mL/min (by C-G formula based on SCr of 0.81 mg/dL). ------------------------------------------------------------------------------------------------------------------ No results for input(s): TSH, T4TOTAL, T3FREE, THYROIDAB in the last 72 hours.  Invalid input(s): FREET3  Cardiac Enzymes  Recent Labs Lab 10/08/16 2147 10/09/16 0104 10/09/16 0640  TROPONINI 0.16* 1.57* 3.67*   ------------------------------------------------------------------------------------------------------------------ Invalid input(s): POCBNP ---------------------------------------------------------------------------------------------------------------  RADIOLOGY: Dg Chest 2 View  Result Date: 10/08/2016 CLINICAL DATA:  Pt  having chest pain today. Hx of heart attack, CAD, hypertension. Former smoker EXAM: CHEST  2 VIEW COMPARISON:  07/03/2015 FINDINGS: Cardiac silhouette is normal in size. No mediastinal or hilar masses. No  evidence of adenopathy. There stable linear scarring at the left lung base. Lungs are otherwise clear. No pleural effusion or pneumothorax. Skeletal structures are demineralized. There are several chronic compression fractures along the thoracic spine. IMPRESSION: 1. No acute cardiopulmonary disease. Electronically Signed   By: Lajean Manes M.D.   On: 10/08/2016 19:29    EKG:  Orders placed or performed during the hospital encounter of 10/08/16  . ED EKG within 10 minutes  . ED EKG within 10 minutes  . EKG 12-Lead  . EKG 12-Lead  . EKG 12-lead  . EKG 12-Lead  . EKG 12-Lead    ASSESSMENT AND PLAN:  Principal Problem:   NSTEMI (non-ST elevated myocardial infarction) ([place]) Active Problems:   Hyperlipidemia   Essential hypertension   Tobacco abuse   CKD (chronic kidney disease), stage II   GERD (gastroesophageal reflux disease)   CAD in native artery   Aortic dissection ([place])  #1. Non-STEMI, status post cardiac catheterization by Dr. Fletcher Anon, revealing moderate, non-critical coronary artery disease involving the distal LMCA, LAD, and RCA. No culprit lesion is evident for patient's NSTEMI, normal left ventricular systolic function and filling pressure. Medical therapy was recommended, including aspirin and Plavix up to 12 months as tolerated, isosorbide mononitrate, high-intensity statin therapy, monitor overnight and follow-up with Dr. Fletcher Anon as outpatient. #2. Acute renal insufficiency, resolved #3. Hyperglycemia, resolved, no obvious diabetes #4 thrombocytopenia, possibly consumption, following the morning  Management plans discussed with the patient, family and they are in agreement.   DRUG ALLERGIES: No Known Allergies  CODE STATUS:     Code Status Orders        Start      Ordered   10/09/16 0044  Full code  Continuous     10/09/16 0043    Code Status History    Date Active Date Inactive Code Status Order ID Comments User Context   08/18/2016  7:27 PM 08/21/2016  5:31 PM Partial Code [id number]  Margarita Mail, RN Inpatient   08/18/2016  3:10 PM 08/18/2016  7:27 PM Full Code [id number]  Demetrios Loll, MD Inpatient   10/21/2014  1:20 PM 10/21/2014  8:37 PM Full Code [id number]  Wellington Hampshire, MD Inpatient   10/20/2014  8:35 PM 10/21/2014  1:20 PM Full Code [id number]  Demetrios Loll, MD Inpatient      TOTAL TIME TAKING CARE OF THIS PATIENT: 40 minutes.    Theodoro Grist M.D on 10/09/2016 at 1:18 PM  Between 7am to 6pm - Pager - [phone]  After 6pm go to [email or web address] - password [email or web address] [facility]  Office  [phone]  CC: Primary care physician; Marinda Elk, MD

## 2016-10-09 NOTE — Care Management (Signed)
Admitted for nstemi.  Cardiology consulting. Independent in all adls, denies issues accessing medical care, obtaining medications or with transportation.

## 2016-10-09 NOTE — Progress Notes (Signed)
ANTICOAGULATION CONSULT NOTE - Initial Consult  Pharmacy Consult for heparin drip Indication: chest pain/ACS  No Known Allergies  Patient Measurements: Height: 5\' 2"  (157.5 cm) Weight: 123 lb 9.6 oz (56.1 kg) IBW/kg (Calculated) : 50.1 Heparin Dosing Weight: 57kg  Vital Signs: Temp: 98.5 F (36.9 C) (05/15 0046) Temp Source: Oral (05/15 0046) BP: 153/90 (05/15 0046) Pulse Rate: 65 (05/15 0046)  Labs:  Recent Labs  10/08/16 1853 10/08/16 2147  HGB 12.8  --   HCT 37.6  --   PLT 154  --   CREATININE 1.04*  --   TROPONINI <0.03 0.16*    Estimated Creatinine Clearance: 38.1 mL/min (A) (by C-G formula based on SCr of 1.04 mg/dL (H)).   Medical History: Past Medical History:  Diagnosis Date  . Anemia   . Chronic back pain   . CKD (chronic kidney disease), stage II   . Coronary artery disease    moderate 2-vessel disease  . Diverticulitis   . Essential hypertension   . GERD (gastroesophageal reflux disease)   . H/O Vertebral Fractures   . History of mania   . History of sciatica   . Hyperlipidemia   . Hypertension   . Peripheral neuropathy   . Tobacco abuse     Medications:  No anticoagulation in PTA meds  Assessment:  Goal of Therapy:  Heparin level 0.3-0.7 Monitor platelets by anticoagulation protocol: Yes   Plan:  3400 unit bolus and initial rate of 700 units/hr. First heparin level 8 hours after start of infusion.  Cheryl Hall 10/09/2016,2:11 AM

## 2016-10-10 ENCOUNTER — Telehealth: Payer: Self-pay | Admitting: Cardiovascular Disease

## 2016-10-10 DIAGNOSIS — E782 Mixed hyperlipidemia: Secondary | ICD-10-CM

## 2016-10-10 DIAGNOSIS — D696 Thrombocytopenia, unspecified: Secondary | ICD-10-CM

## 2016-10-10 DIAGNOSIS — R079 Chest pain, unspecified: Secondary | ICD-10-CM

## 2016-10-10 DIAGNOSIS — I071 Rheumatic tricuspid insufficiency: Secondary | ICD-10-CM

## 2016-10-10 DIAGNOSIS — R739 Hyperglycemia, unspecified: Secondary | ICD-10-CM

## 2016-10-10 DIAGNOSIS — Z720 Tobacco use: Secondary | ICD-10-CM

## 2016-10-10 DIAGNOSIS — N289 Disorder of kidney and ureter, unspecified: Secondary | ICD-10-CM

## 2016-10-10 LAB — BASIC METABOLIC PANEL
ANION GAP: 4 — AB (ref 5–15)
BUN: 18 mg/dL (ref 6–20)
CO2: 25 mmol/L (ref 22–32)
Calcium: 8.4 mg/dL — ABNORMAL LOW (ref 8.9–10.3)
Chloride: 112 mmol/L — ABNORMAL HIGH (ref 101–111)
Creatinine, Ser: 0.93 mg/dL (ref 0.44–1.00)
GFR calc Af Amer: >60 mL/min (ref >60)
GFR calc non Af Amer: 60 mL/min — ABNORMAL LOW (ref >60)
GLUCOSE: 84 mg/dL (ref 65–99)
POTASSIUM: 4 mmol/L (ref 3.5–5.1)
Sodium: 141 mmol/L (ref 135–145)

## 2016-10-10 LAB — CBC
HEMATOCRIT: 35.2 % (ref 35.0–47.0)
Hemoglobin: 11.9 g/dL — ABNORMAL LOW (ref 12.0–16.0)
MCH: 31.7 pg (ref 26.0–34.0)
MCHC: 33.8 g/dL (ref 32.0–36.0)
MCV: 93.7 fL (ref 80.0–100.0)
PLATELETS: 144 10*3/uL — AB (ref 150–440)
RBC: 3.76 MIL/uL — AB (ref 3.80–5.20)
RDW: 14 % (ref 11.5–14.5)
WBC: 6.4 10*3/uL (ref 3.6–11.0)

## 2016-10-10 LAB — ECHOCARDIOGRAM COMPLETE
HEIGHTINCHES: 62 in
WEIGHTICAEL: 1977.6 [oz_av]

## 2016-10-10 MED ORDER — ISOSORBIDE MONONITRATE ER 30 MG PO TB24: 30.0000 mg | ORAL_TABLET | Freq: Every day | ORAL | 3 refills | Status: DC

## 2016-10-10 MED ORDER — ASPIRIN 81 MG PO TBEC: 81.0000 mg | DELAYED_RELEASE_TABLET | Freq: Every day | ORAL | 3 refills | Status: DC

## 2016-10-10 MED ORDER — ATORVASTATIN CALCIUM 40 MG PO TABS: 40.0000 mg | ORAL_TABLET | Freq: Every day | ORAL | 3 refills | Status: DC

## 2016-10-10 MED ORDER — CLOPIDOGREL BISULFATE 75 MG PO TABS: 75.0000 mg | ORAL_TABLET | Freq: Every day | ORAL | 3 refills | Status: DC

## 2016-10-10 NOTE — Care Management (Signed)
No discharge needs identified by members of the care team 

## 2016-10-10 NOTE — Discharge Summary (Signed)
[facility] at [place] NAME: Cheryl Hall    MR#:  [id number]  DATE OF BIRTH:  09/30/1940  DATE OF ADMISSION:  10/08/2016 ADMITTING PHYSICIAN: Ubaldo Glassing Hugelmeyer, DO  DATE OF DISCHARGE: No discharge date for patient encounter.  PRIMARY CARE PHYSICIAN: Marinda Elk, MD     ADMISSION DIAGNOSIS:  Elevated troponin [R74.8] Chest pain, unspecified type [R07.9]  DISCHARGE DIAGNOSIS:  Principal Problem:   NSTEMI (non-ST elevated myocardial infarction) ([name]) Active Problems:   Hyperlipidemia   Essential hypertension   Tobacco abuse   CKD (chronic kidney disease), stage II   GERD (gastroesophageal reflux disease)   CAD in native artery   Moderate tricuspid regurgitation   Acute renal insufficiency   Hyperglycemia   Thrombocytopenia ([place])   SECONDARY DIAGNOSIS:   Past Medical History:  Diagnosis Date  . Anemia   . Chronic back pain   . CKD (chronic kidney disease), stage II   . Coronary artery disease    moderate 2-vessel disease  . Diverticulitis   . Essential hypertension   . GERD (gastroesophageal reflux disease)   . H/O Vertebral Fractures   . History of mania   . History of sciatica   . Hyperlipidemia   . Hypertension   . Peripheral neuropathy   . Tobacco abuse     .pro HOSPITAL COURSE:  The patient is 76 year old Caucasian female with past medical history significant for history of [name], anemia, CAD, stage II, coronary artery disease, status post non-STEMI, hypertension, hyperlipidemia, peripheral neuropathy, who presented to the hospital with complaints of chest pain associated with nausea, diaphoresis, palpitations. Pain was localized in central chest area and occurred at rest. Initial cardiac enzymes were negative, however, subsequent revealed elevation, maximum level of 3.67. Patient was seen by cardiologist, Underwent cardiac catheterization, which revealed moderate, non-critical coronary  artery disease involving the distal LMCA, LAD, and RCA. No culprit lesion is evident for patient's NSTEMI. Normal left ventricular systolic function and filling pressure. Patient feels comfortable today , she was felt to be stable to be discharged home today. She is to follow up with cardiologist as outpatient. She was recommended to continue aspirin, Plavix, Lipitor, Imdur. Discussion by problem:  #1. Non-STEMI, status post cardiac catheterization by Dr.End, revealing moderate, non-critical coronary artery disease involving the distal LMCA, LAD, and RCA. No culprit lesion is evident for patient's NSTEMI, normal left ventricular systolic function and filling pressure. Medical therapy was recommended, including aspirin and Plavix up to 12 months as tolerated, isosorbide mononitrate, high-intensity statin therapy, monitor overnight and follow-up with Dr. Fletcher Anon as outpatient. Patient felt good today, she is stable to be discharged home today, discussed with Dr. Candis Musa, cardiology PA, Mr. Idolina Primer .  #2. Acute renal insufficiency, resolved, follow as outpatient #3. Hyperglycemia, fasting blood glucose level was 84, no diabetes #4 thrombocytopenia, possibly medication related , stable, follow as outpatient   DISCHARGE CONDITIONS:   Stable   CONSULTS OBTAINED:  Treatment Team:  End, Harrell Gave, MD  DRUG ALLERGIES:  No Known Allergies  DISCHARGE MEDICATIONS:   Current Discharge Medication List    START taking these medications   Details  aspirin EC 81 MG EC tablet Take 1 tablet (81 mg total) by mouth daily. Qty: 30 tablet, Refills: 3    atorvastatin (LIPITOR) 40 MG tablet Take 1 tablet (40 mg total) by mouth daily at 6 PM. Qty: 30 tablet, Refills: 3    clopidogrel (PLAVIX) 75 MG tablet Take 1 tablet (  75 mg total) by mouth daily with breakfast. Qty: 30 tablet, Refills: 3    isosorbide mononitrate (IMDUR) 30 MG 24 hr tablet Take 1 tablet (30 mg total) by mouth daily. Qty: 30 tablet, Refills:  3      CONTINUE these medications which have NOT CHANGED   Details  acetaminophen (TYLENOL) 500 MG tablet Take 500 mg by mouth every 6 (six) hours as needed for mild pain or headache.     carvedilol (COREG) 6.25 MG tablet TAKE ONE TABLET BY MOUTH TWICE DAILY WITH MEALS Qty: 180 tablet, Refills: 3    Cholecalciferol (VITAMIN D-3 PO) Take 1 tablet by mouth daily.    ferrous sulfate 325 (65 FE) MG tablet Take 325 mg by mouth daily.    lisinopril (PRINIVIL,ZESTRIL) 40 MG tablet TAKE ONE TABLET BY MOUTH ONCE DAILY Qty: 90 tablet, Refills: 3    nicotine (NICOTROL) 10 MG inhaler Inhale 1 Cartridge (1 continuous puffing total) into the lungs as needed for smoking cessation. Qty: 42 each, Refills: 0    nitroGLYCERIN (NITROSTAT) 0.4 MG SL tablet Place 0.4 mg under the tongue every 5 (five) minutes as needed for chest pain.    omeprazole (PRILOSEC) 20 MG capsule Take 20 mg by mouth daily.    risperiDONE (RISPERDAL) 0.5 MG tablet Take 0.5 mg by mouth 2 (two) times daily.    vitamin B-12 (CYANOCOBALAMIN) 1000 MCG tablet Take 1,000 mcg by mouth daily.      STOP taking these medications     simvastatin (ZOCOR) 40 MG tablet          DISCHARGE INSTRUCTIONS:    The patient is to follow-up with primary care physician, cardiologist as outpatient   If you experience worsening of your admission symptoms, develop shortness of breath, life threatening emergency, suicidal or homicidal thoughts you must seek medical attention immediately by calling 911 or calling your MD immediately  if symptoms less severe.  You Must read complete instructions/literature along with all the possible adverse reactions/side effects for all the Medicines you take and that have been prescribed to you. Take any new Medicines after you have completely understood and accept all the possible adverse reactions/side effects.   Please note  You were cared for by a hospitalist during your hospital stay. If you have any  questions about your discharge medications or the care you received while you were in the hospital after you are discharged, you can call the unit and asked to speak with the hospitalist on call if the hospitalist that took care of you is not available. Once you are discharged, your primary care physician will handle any further medical issues. Please note that NO REFILLS for any discharge medications will be authorized once you are discharged, as it is imperative that you return to your primary care physician (or establish a relationship with a primary care physician if you do not have one) for your aftercare needs so that they can reassess your need for medications and monitor your lab values.    Today   CHIEF COMPLAINT:   Chief Complaint  Patient presents with  . Chest Pain    HISTORY OF PRESENT ILLNESS:    VITAL SIGNS:  Blood pressure (!) 136/58, pulse 64, temperature 98.4 F (36.9 C), temperature source Oral, resp. rate 18, height 5\' 2"  (1.575 m), weight 56.1 kg (123 lb 9.6 oz), SpO2 95 %.  I/O:   Intake/Output Summary (Last 24 hours) at 10/10/16 1206 Last data filed at 10/10/16 1002  Gross  per 24 hour  Intake                0 ml  Output              250 ml  Net             -250 ml    PHYSICAL EXAMINATION:  GENERAL:  76 y.o.-year-old patient lying in the bed with no acute distress.  EYES: Pupils equal, round, reactive to light and accommodation. No scleral icterus. Extraocular muscles intact.  HEENT: Head atraumatic, normocephalic. Oropharynx and nasopharynx clear.  NECK:  Supple, no jugular venous distention. No thyroid enlargement, no tenderness.  LUNGS: Normal breath sounds bilaterally, no wheezing, rales,rhonchi or crepitation. No use of accessory muscles of respiration.  CARDIOVASCULAR: S1, S2 normal. No murmurs, rubs, or gallops.  ABDOMEN: Soft, non-tender, non-distended. Bowel sounds present. No organomegaly or mass.  EXTREMITIES: No pedal edema, cyanosis, or  clubbing.  NEUROLOGIC: Cranial nerves II through XII are intact. Muscle strength 5/5 in all extremities. Sensation intact. Gait not checked.  PSYCHIATRIC: The patient is alert and oriented x 3.  SKIN: No obvious rash, lesion, or ulcer.   DATA REVIEW:   CBC  Recent Labs Lab 10/10/16 0537  WBC 6.4  HGB 11.9*  HCT 35.2  PLT 144*    Chemistries   Recent Labs Lab 10/08/16 1853  10/10/16 0537  NA 137  < > 141  K 3.9  < > 4.0  CL 106  < > 112*  CO2 23  < > 25  GLUCOSE 122*  < > 84  BUN 15  < > 18  CREATININE 1.04*  < > 0.93  CALCIUM 8.6*  < > 8.4*  AST 22  --   --   ALT 7*  --   --   ALKPHOS 65  --   --   BILITOT 0.4  --   --   < > = values in this interval not displayed.  Cardiac Enzymes  Recent Labs Lab 10/09/16 1422  TROPONINI 2.50*    Microbiology Results  Results for orders placed or performed during the hospital encounter of 08/18/16  Gastrointestinal Panel by PCR , Stool     Status: None   Collection Time: 08/19/16 12:57 PM  Result Value Ref Range Status   Campylobacter species NOT DETECTED NOT DETECTED Final   Plesimonas shigelloides NOT DETECTED NOT DETECTED Final   Salmonella species NOT DETECTED NOT DETECTED Final   Yersinia enterocolitica NOT DETECTED NOT DETECTED Final   Vibrio species NOT DETECTED NOT DETECTED Final   Vibrio cholerae NOT DETECTED NOT DETECTED Final   Enteroaggregative E coli (EAEC) NOT DETECTED NOT DETECTED Final   Enteropathogenic E coli (EPEC) NOT DETECTED NOT DETECTED Final   Enterotoxigenic E coli (ETEC) NOT DETECTED NOT DETECTED Final   Shiga like toxin producing E coli (STEC) NOT DETECTED NOT DETECTED Final   Shigella/Enteroinvasive E coli (EIEC) NOT DETECTED NOT DETECTED Final   Cryptosporidium NOT DETECTED NOT DETECTED Final   Cyclospora cayetanensis NOT DETECTED NOT DETECTED Final   Entamoeba histolytica NOT DETECTED NOT DETECTED Final   Giardia lamblia NOT DETECTED NOT DETECTED Final   Adenovirus F40/41 NOT DETECTED  NOT DETECTED Final   Astrovirus NOT DETECTED NOT DETECTED Final   Norovirus GI/GII NOT DETECTED NOT DETECTED Final   Rotavirus A NOT DETECTED NOT DETECTED Final   Sapovirus (I, II, IV, and V) NOT DETECTED NOT DETECTED Final  C difficile quick scan w PCR reflex  Status: Abnormal   Collection Time: 08/19/16 12:57 PM  Result Value Ref Range Status   C Diff antigen POSITIVE (A) NEGATIVE Final   C Diff toxin NEGATIVE NEGATIVE Final   C Diff interpretation Results are indeterminate. See PCR results.  Final  Clostridium Difficile by PCR     Status: Abnormal   Collection Time: 08/19/16 12:57 PM  Result Value Ref Range Status   Toxigenic C Difficile by pcr POSITIVE (A) NEGATIVE Final    Comment: Positive for toxigenic [name] with little to no toxin production. Only treat if clinical presentation suggests symptomatic illness.    RADIOLOGY:  Dg Chest 2 View  Result Date: 10/08/2016 CLINICAL DATA:  Pt having chest pain today. Hx of heart attack, CAD, hypertension. Former smoker EXAM: CHEST  2 VIEW COMPARISON:  07/03/2015 FINDINGS: Cardiac silhouette is normal in size. No mediastinal or hilar masses. No evidence of adenopathy. There stable linear scarring at the left lung base. Lungs are otherwise clear. No pleural effusion or pneumothorax. Skeletal structures are demineralized. There are several chronic compression fractures along the thoracic spine. IMPRESSION: 1. No acute cardiopulmonary disease. Electronically Signed   By: Lajean Manes M.D.   On: 10/08/2016 19:29    EKG:   Orders placed or performed during the hospital encounter of 10/08/16  . ED EKG within 10 minutes  . ED EKG within 10 minutes  . EKG 12-Lead  . EKG 12-Lead  . EKG 12-lead  . EKG 12-Lead  . EKG 12-Lead  . EKG 12-Lead      Management plans discussed with the patient, family and they are in agreement.  CODE STATUS:     Code Status Orders        Start     Ordered   10/09/16 0044  Full code  Continuous      10/09/16 0043    Code Status History    Date Active Date Inactive Code Status Order ID Comments User Context   08/18/2016  7:27 PM 08/21/2016  5:31 PM Partial Code [id number]  Margarita Mail, RN Inpatient   08/18/2016  3:10 PM 08/18/2016  7:27 PM Full Code [id number]  Demetrios Loll, MD Inpatient   10/21/2014  1:20 PM 10/21/2014  8:37 PM Full Code [id number]  Wellington Hampshire, MD Inpatient   10/20/2014  8:35 PM 10/21/2014  1:20 PM Full Code [id number]  Demetrios Loll, MD Inpatient      TOTAL TIME TAKING CARE OF THIS PATIENt 40 minutes.  Discussed with patient's sister, all questions were answered  Cristhian Vanhook M.D on 10/10/2016 at 12:06 PM  Between 7am to 6pm - Pager - [phone]  After 6pm go to [email or web address] - password [email or web address] [facility]  Office  [phone]  CC: Primary care physician; Marinda Elk, MD

## 2016-10-10 NOTE — Telephone Encounter (Signed)
TCM.... Pt is being discharged today  Is a patient of Dr Fletcher Anon but saw Dr End in hospital He is coming in on 10/31/16 to see Ignacia Bayley

## 2016-10-10 NOTE — Progress Notes (Signed)
Progress Note  Patient Name: Cheryl Hall Date of Encounter: 10/10/2016  Primary Cardiologist: Dr. Fletcher Anon  Subjective   Patient reports that she feels well this morning Denies any significant chest pain Recovered well from her catheterization yesterday showing moderate nonobstructive disease. Recently stopped smoking per the patient Tolerating her statin Cholesterol numbers reviewed with her today, at goal  Echocardiogram reviewed showing Normal LV function, mild to moderately dilated aorta  Inpatient Medications    Scheduled Meds: . aspirin EC  81 mg Oral Daily  . atorvastatin  40 mg Oral q1800  . carvedilol  6.25 mg Oral BID WC  . clopidogrel  75 mg Oral Q breakfast  . enoxaparin (LOVENOX) injection  40 mg Subcutaneous Q24H  . ferrous sulfate  325 mg Oral Daily  . isosorbide mononitrate  30 mg Oral Daily  . lisinopril  40 mg Oral Daily  . pantoprazole  40 mg Oral Daily  . risperiDONE  0.5 mg Oral BID  . sodium chloride flush  3 mL Intravenous Q12H  . vitamin B-12  1,000 mcg Oral Daily   Continuous Infusions: . sodium chloride     PRN Meds: sodium chloride, acetaminophen, ALPRAZolam, nicotine polacrilex, nitroGLYCERIN, ondansetron (ZOFRAN) IV, sodium chloride flush, zolpidem   Vital Signs    Vitals:   10/10/16 0757 10/10/16 0916 10/10/16 1121 10/10/16 1227  BP: 116/65 (!) 144/71 (!) 136/58 115/71  Pulse: (!) 56 62 64 60  Resp: 18   12  Temp: 98.4 F (36.9 C)   97.8 F (36.6 C)  TempSrc: Oral   Oral  SpO2:  95% 95% 94%  Weight:      Height:        Intake/Output Summary (Last 24 hours) at 10/10/16 1521 Last data filed at 10/10/16 1002  Gross per 24 hour  Intake                0 ml  Output              250 ml  Net             -250 ml   Filed Weights   10/08/16 1856 10/09/16 0046  Weight: 125 lb (56.7 kg) 123 lb 9.6 oz (56.1 kg)    Telemetry    Normal sinus rhythm - Personally Reviewed  ECG      Physical Exam  Elderly woman in no apparent  distress laying comfortably in bed  GEN: No acute distress.  Neck: No JVD  Cardiac: RRR, no murmurs, rubs, or gallops.  Radials/DP/PT 2+ and equal bilaterally.  Respiratory:  Clear to auscultation bilaterally. GI: Soft, nontender, non-distended  MS: no deformity; no edema Neuro:  Alert and oriented x 3  Labs    Chemistry Recent Labs Lab 10/08/16 1853 10/09/16 0640 10/10/16 0537  NA 137 139 141  K 3.9 4.0 4.0  CL 106 107 112*  CO2 23 27 25   GLUCOSE 122* 87 84  BUN 15 14 18   CREATININE 1.04* 0.81 0.93  CALCIUM 8.6* 8.6* 8.4*  PROT 6.1*  --   --   ALBUMIN 3.6  --   --   AST 22  --   --   ALT 7*  --   --   ALKPHOS 65  --   --   BILITOT 0.4  --   --   GFRNONAA 52* >60 60*  GFRAA >60 >60 >60  ANIONGAP 8 5 4*     Hematology Recent Labs Lab 10/08/16  1853 10/09/16 0640 10/10/16 0537  WBC 6.5 6.2 6.4  RBC 3.96 4.07 3.76*  HGB 12.8 13.0 11.9*  HCT 37.6 38.8 35.2  MCV 94.9 95.2 93.7  MCH 32.3 31.8 31.7  MCHC 34.0 33.4 33.8  RDW 14.1 13.8 14.0  PLT 154 147* 144*    Cardiac Enzymes Recent Labs Lab 10/08/16 2147 10/09/16 0104 10/09/16 0640 10/09/16 1422  TROPONINI 0.16* 1.57* 3.67* 2.50*   No results for input(s): TROPIPOC in the last 168 hours.   BNPNo results for input(s): BNP, PROBNP in the last 168 hours.   DDimer No results for input(s): DDIMER in the last 168 hours.   Radiology    Dg Chest 2 View  Result Date: 10/08/2016 CLINICAL DATA:  Pt having chest pain today. Hx of heart attack, CAD, hypertension. Former smoker EXAM: CHEST  2 VIEW COMPARISON:  07/03/2015 FINDINGS: Cardiac silhouette is normal in size. No mediastinal or hilar masses. No evidence of adenopathy. There stable linear scarring at the left lung base. Lungs are otherwise clear. No pleural effusion or pneumothorax. Skeletal structures are demineralized. There are several chronic compression fractures along the thoracic spine. IMPRESSION: 1. No acute cardiopulmonary disease. Electronically  Signed   By: Lajean Manes M.D.   On: 10/08/2016 19:29    Cardiac Studies   Echocardiogram detailed above with ejection fraction greater than 55%   Patient Profile     76 y.o. 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 Lucky Cowboy with medical management advised currently, uncontrolled HTN, prior tobacco abuse, and HLD who presented to [facility] with a one-day history of lower chest to epigastric pain and was found to have a NSTEMI. Cardiac catheterization yesterday showing moderate nonobstructive disease   Assessment & Plan    1) NSTEMI Cardiac catheterization yesterday by Dr.  Saunders Revel 1. Moderate, non-critical coronary artery disease involving the distal LMCA, LAD, and RCA. No culprit lesion is evident for patient's NSTEMI. 2. Normal left ventricular systolic function and filling pressure. Medical management recommended   discharge home on aspirin, Plavix, high-dose statin, beta blocker, ACE inhibitor  2) HTN Markedly elevated on arrival With discharge home on Imdur 30 mg daily in addition to her lisinopril and carvedilol Recommended close follow-up of her blood pressure as an outpatient Follow-up with Dr.  Fletcher Anon   Total encounter time more than 25 minutes  Greater than 50% was spent in counseling and coordination of care with the patient   Signed, Ida Rogue, MD  10/10/2016, 3:21 PM

## 2016-10-10 NOTE — Progress Notes (Signed)
Patient is being discharge home today as per order, discharge instruction provided, iv removed, tele removed, patient discharge home at this time

## 2016-10-11 NOTE — Telephone Encounter (Signed)
Patient contacted regarding discharge from [facility] on 10/10/16.   Patient understands to follow up with provider ? On 10/31/16 at 10:30 am at [facility] with Ignacia Bayley, NP.   Patient understands discharge instructions? Yes  Patient understands medications and regiment? Yes Patient understands to bring all medications to this visit? Yes

## 2016-10-19 DIAGNOSIS — R51 Headache: Secondary | ICD-10-CM | POA: Diagnosis not present

## 2016-10-19 DIAGNOSIS — I10 Essential (primary) hypertension: Secondary | ICD-10-CM | POA: Diagnosis not present

## 2016-10-19 DIAGNOSIS — I214 Non-ST elevation (NSTEMI) myocardial infarction: Secondary | ICD-10-CM | POA: Diagnosis not present

## 2016-10-19 DIAGNOSIS — I2510 Atherosclerotic heart disease of native coronary artery without angina pectoris: Secondary | ICD-10-CM | POA: Diagnosis not present

## 2016-10-25 DIAGNOSIS — R7303 Prediabetes: Secondary | ICD-10-CM | POA: Diagnosis not present

## 2016-10-25 DIAGNOSIS — I10 Essential (primary) hypertension: Secondary | ICD-10-CM | POA: Diagnosis not present

## 2016-10-25 DIAGNOSIS — M810 Age-related osteoporosis without current pathological fracture: Secondary | ICD-10-CM | POA: Diagnosis not present

## 2016-10-25 DIAGNOSIS — E782 Mixed hyperlipidemia: Secondary | ICD-10-CM | POA: Diagnosis not present

## 2016-10-31 ENCOUNTER — Encounter: Payer: Self-pay | Admitting: Nurse Practitioner

## 2016-10-31 ENCOUNTER — Ambulatory Visit (INDEPENDENT_AMBULATORY_CARE_PROVIDER_SITE_OTHER): Payer: Medicare HMO | Admitting: Nurse Practitioner

## 2016-10-31 VITALS — BP 168/100 | HR 65 | Ht 62.5 in | Wt 126.5 lb

## 2016-10-31 DIAGNOSIS — E782 Mixed hyperlipidemia: Secondary | ICD-10-CM | POA: Diagnosis not present

## 2016-10-31 DIAGNOSIS — I214 Non-ST elevation (NSTEMI) myocardial infarction: Secondary | ICD-10-CM | POA: Diagnosis not present

## 2016-10-31 DIAGNOSIS — G4440 Drug-induced headache, not elsewhere classified, not intractable: Secondary | ICD-10-CM | POA: Diagnosis not present

## 2016-10-31 DIAGNOSIS — I2510 Atherosclerotic heart disease of native coronary artery without angina pectoris: Secondary | ICD-10-CM

## 2016-10-31 DIAGNOSIS — I10 Essential (primary) hypertension: Secondary | ICD-10-CM | POA: Diagnosis not present

## 2016-10-31 MED ORDER — AMLODIPINE BESYLATE 5 MG PO TABS: 5.0000 mg | ORAL_TABLET | Freq: Every day | ORAL | 5 refills | Status: DC

## 2016-10-31 MED ORDER — PANTOPRAZOLE SODIUM 40 MG PO TBEC: 40.0000 mg | DELAYED_RELEASE_TABLET | Freq: Every day | ORAL | 5 refills | Status: DC

## 2016-10-31 NOTE — Patient Instructions (Signed)
Medication Instructions:  Your physician has recommended you make the following change in your medication:  STOP taking Imdur STOP taking prilosec START taking amlodipine 5mg  once daily START taking protonix 40mg  once daily   Labwork: none  Testing/Procedures: none  Follow-Up: Your physician recommends that you schedule a follow-up appointment in: 2-3 months with Dr. Fletcher Anon.    Any Other Special Instructions Will Be Listed Below (If Applicable).     If you need a refill on your cardiac medications before your next appointment, please call your pharmacy.

## 2016-10-31 NOTE — Progress Notes (Signed)
Office Visit    Patient Name: Cheryl Hall Date of Encounter: 10/31/2016  Primary Care Provider:  Marinda Elk, MD Primary Cardiologist:  Jerilynn Mages. Fletcher Anon, MD   Chief Complaint    76 year old female with a prior history of nonobstructive CAD, hypertension, hyperlipidemia, stage II chronic kidney disease, and tobacco abuse, who presents for follow-up after recent hospitalization for hypertensive urgency and non-STEMI.  Past Medical History    Past Medical History:  Diagnosis Date  . Anemia   . Chronic back pain   . CKD (chronic kidney disease), stage II   . Coronary artery disease    a. 09/2014 NSTEMI/Cath: moderate 2-vessel disease;  b. 09/2016 NSTEMI/Cath: LM 30, LAD 50p, 11m, LCX nl, RCA 9m, 50d, EF 55-65%.  . Diverticulitis   . Essential hypertension   . GERD (gastroesophageal reflux disease)   . H/O Vertebral Fractures   . History of echocardiogram    a. 09/2016 Echo: EF 55-60%, no rwma, mildly dil Ao root (3.7cm) and Asc Ao (3.9cm), mild to mod TR.  Marland Kitchen History of mania   . History of sciatica   . History of tobacco abuse   . Hyperlipidemia   . Peripheral neuropathy    Past Surgical History:  Procedure Laterality Date  . CARDIAC CATHETERIZATION N/A 10/21/2014   Procedure: Left Heart Cath and Coronary Angiography;  Surgeon: Wellington Hampshire, MD;  Location: [place] [facility];  Service: Cardiovascular;  Laterality: N/A;  . CARDIAC CATHETERIZATION  10/21/2014   Dr. Fletcher Anon  . CHOLECYSTECTOMY    . LEFT HEART CATH AND CORONARY ANGIOGRAPHY N/A 10/09/2016   Procedure: Left Heart Cath and Coronary Angiography;  Surgeon: Nelva Bush, MD;  Location: [place] [facility];  Service: Cardiovascular;  Laterality: N/A;    Allergies  No Known Allergies  History of Present Illness    76 year old female with the above past medical history including nonobstructive CAD status post non-STEMI and cardiac catheterization in May 2016, revealing nonobstructive CAD. Other history  includes hypertension, hyperlipidemia, tobacco abuse, and stage II chronic kidney disease. She was recently admitted to [facility] with chest pain and marked hypertension. She ruled in for non-STEMI. Echo showed normal LV function. Diagnostic catheterization showed stable, nonobstructive coronary artery disease involving the LAD and right coronary artery. Antihypertensive therapy was adjusted and she was subsequently discharged. Since discharge, she has felt well. She has had no chest pain or dyspnea. She has had headaches since discharge which she thinks may be attributable to isosorbide mononitrate therapy. She has checked her blood pressure a few times and has noted it to be elevated. She was recently seen by primary care and her carvedilol was increased from 6.25 mg twice a day to 12.5 mg twice a day. Her right wrist catheterization site has healed up well. She denies PND, orthopnea, dizziness, syncope, edema, or early satiety.  Home Medications    Prior to Admission medications   Medication Sig Start Date End Date Taking? Authorizing Provider  acetaminophen (TYLENOL) 500 MG tablet Take 500 mg by mouth every 6 (six) hours as needed for mild pain or headache.    Yes [provider]  aspirin EC 81 MG EC tablet Take 1 tablet (81 mg total) by mouth daily. 10/11/16  Yes Theodoro Grist, MD  atorvastatin (LIPITOR) 40 MG tablet Take 1 tablet (40 mg total) by mouth daily at 6 PM. 10/10/16  Yes Theodoro Grist, MD  carvedilol (COREG) 12.5 MG tablet Take 12.5 mg by mouth 2 (two)  times daily with a meal.   Yes [provider]  Cholecalciferol (VITAMIN D-3 PO) Take 1 tablet by mouth daily.   Yes [provider]  clopidogrel (PLAVIX) 75 MG tablet Take 1 tablet (75 mg total) by mouth daily with breakfast. 10/11/16  Yes Theodoro Grist, MD  ferrous sulfate 325 (65 FE) MG tablet Take 325 mg by mouth daily.   Yes [provider]  lisinopril (PRINIVIL,ZESTRIL) 40 MG tablet TAKE  ONE TABLET BY MOUTH ONCE DAILY 04/10/16  Yes Wellington Hampshire, MD  nitroGLYCERIN (NITROSTAT) 0.4 MG SL tablet Place 0.4 mg under the tongue every 5 (five) minutes as needed for chest pain.   Yes [provider]  vitamin B-12 (CYANOCOBALAMIN) 1000 MCG tablet Take 1,000 mcg by mouth daily.   Yes [provider]  amLODipine (NORVASC) 5 MG tablet Take 1 tablet (5 mg total) by mouth daily. 10/31/16 01/29/17  Rogelia Mire, NP  pantoprazole (PROTONIX) 40 MG tablet Take 1 tablet (40 mg total) by mouth daily. 10/31/16   Rogelia Mire, NP    Review of Systems    As above, she has been doing well since discharge but has had headaches since discharge. Blood pressures have been running high at home. She denies chest pain, dyspnea, palpitations, PND, orthopnea, dizziness, syncope, edema, or early satiety.  All other systems reviewed and are otherwise negative except as noted above.  Physical Exam    VS:  BP (!) 168/100 (BP Location: Left Arm, Patient Position: Sitting, Cuff Size: Normal)   Pulse 65   Ht 5' 2.5" (1.588 m)   Wt 126 lb 8 oz (57.4 kg)   BMI 22.77 kg/m  , BMI Body mass index is 22.77 kg/m. GEN: Well nourished, well developed, in no acute distress.  HEENT: normal.  Neck: Supple, no JVD, carotid bruits, or masses. Cardiac: RRR, no murmurs, rubs, or gallops. No clubbing, cyanosis, edema.  Radials/DP/PT 2+ and equal bilaterally. Right wrist catheterization site without bleeding, bruit, or hematoma. Respiratory:  Respirations regular and unlabored, clear to auscultation bilaterally. GI: Soft, nontender, nondistended, BS + x 4. MS: no deformity or atrophy. Skin: warm and dry, no rash. Neuro:  Strength and sensation are intact. Psych: Normal affect.  Accessory Clinical Findings    ECG - Regular sinus rhythm, 65, no acute ST or T changes.  Assessment & Plan    1.  Non-STEMI, subsequent episode of care/nonobstructive CAD: Patient was recently admitted with  hypertensive urgency and troponin elevation. Catheterization showed nonobstructive CAD. She has not had any chest pain since discharge. She remains on aspirin, Plavix, statin, beta blocker, and ACE inhibitor therapy. She was also placed on long-acting nitrate therapy though has noted headaches. I've asked her to discontinue this today. She is now on Plavix, I'm going to switch her from Prilosec to Protonix.  2. Hypertensive heart disease: Blood pressure is elevated today at 168/100. She does not appear to be tolerating isosorbide mononitrate. I'm going to discontinue this and add amlodipine 5 mg daily. She otherwise remains on carvedilol and lisinopril therapy. She has follow-up with primary care for blood pressure evaluation this week.  3. LDL was 63 on May 15. She remains on Lipitor therapy.  4. Stage II chronic kidney disease: Creatinine was stable throughout hospitalization. She remains on ACE inhibitor therapy.  5. Headaches: Patient has been having intermittent headaches since discharge. These may be related to isosorbide therapy and therefore I'm discontinuing isosorbide.  6. GERD: Symptoms are stable. I'm switching  her from Prilosec to Protonix since she is now on Plavix.   7. Disposition: Follow-up with Dr. Fletcher Anon in 2-3 months.  Murray Hodgkins, NP 10/31/2016, 12:40 PM

## 2016-11-01 DIAGNOSIS — K219 Gastro-esophageal reflux disease without esophagitis: Secondary | ICD-10-CM | POA: Diagnosis not present

## 2016-11-01 DIAGNOSIS — I2510 Atherosclerotic heart disease of native coronary artery without angina pectoris: Secondary | ICD-10-CM | POA: Diagnosis not present

## 2016-11-01 DIAGNOSIS — I10 Essential (primary) hypertension: Secondary | ICD-10-CM | POA: Diagnosis not present

## 2016-11-01 DIAGNOSIS — N182 Chronic kidney disease, stage 2 (mild): Secondary | ICD-10-CM | POA: Diagnosis not present

## 2016-11-01 DIAGNOSIS — I214 Non-ST elevation (NSTEMI) myocardial infarction: Secondary | ICD-10-CM | POA: Diagnosis not present

## 2016-11-01 DIAGNOSIS — E782 Mixed hyperlipidemia: Secondary | ICD-10-CM | POA: Diagnosis not present

## 2016-11-01 DIAGNOSIS — M810 Age-related osteoporosis without current pathological fracture: Secondary | ICD-10-CM | POA: Diagnosis not present

## 2016-11-01 DIAGNOSIS — I7100 Dissection of unspecified site of aorta: Secondary | ICD-10-CM | POA: Diagnosis not present

## 2016-11-07 ENCOUNTER — Other Ambulatory Visit: Payer: Self-pay | Admitting: *Deleted

## 2016-11-07 ENCOUNTER — Telehealth: Payer: Self-pay | Admitting: Cardiovascular Disease

## 2016-11-07 MED ORDER — PANTOPRAZOLE SODIUM 40 MG PO TBEC: 40.0000 mg | DELAYED_RELEASE_TABLET | Freq: Every day | ORAL | 5 refills | Status: DC

## 2016-11-07 NOTE — Telephone Encounter (Signed)
Requested Prescriptions   Signed Prescriptions Disp Refills  . pantoprazole (PROTONIX) 40 MG tablet 30 tablet 5    Sig: Take 1 tablet (40 mg total) by mouth daily.    Authorizing Provider: Rogelia Mire    Ordering User: Britt Bottom

## 2016-11-07 NOTE — Telephone Encounter (Signed)
Pt calling stating she went to pharmacy to pick up the protonix 40mg   And it was not at her pharmacy   Please send it to [facility] on [address]

## 2016-11-29 DIAGNOSIS — I10 Essential (primary) hypertension: Secondary | ICD-10-CM | POA: Diagnosis not present

## 2016-12-20 ENCOUNTER — Ambulatory Visit (INDEPENDENT_AMBULATORY_CARE_PROVIDER_SITE_OTHER): Payer: Medicare HMO | Admitting: Vascular Surgery

## 2016-12-20 ENCOUNTER — Ambulatory Visit (INDEPENDENT_AMBULATORY_CARE_PROVIDER_SITE_OTHER): Payer: Medicare HMO

## 2016-12-20 DIAGNOSIS — I700 Atherosclerosis of aorta: Secondary | ICD-10-CM | POA: Diagnosis not present

## 2016-12-25 ENCOUNTER — Encounter (INDEPENDENT_AMBULATORY_CARE_PROVIDER_SITE_OTHER): Payer: Self-pay | Admitting: Vascular Surgery

## 2016-12-27 DIAGNOSIS — H6123 Impacted cerumen, bilateral: Secondary | ICD-10-CM | POA: Diagnosis not present

## 2016-12-27 DIAGNOSIS — H903 Sensorineural hearing loss, bilateral: Secondary | ICD-10-CM | POA: Diagnosis not present

## 2017-01-02 ENCOUNTER — Encounter: Payer: Self-pay | Admitting: Gastroenterology

## 2017-01-17 ENCOUNTER — Ambulatory Visit (INDEPENDENT_AMBULATORY_CARE_PROVIDER_SITE_OTHER): Payer: Medicare HMO | Admitting: Cardiovascular Disease

## 2017-01-17 ENCOUNTER — Encounter: Payer: Self-pay | Admitting: Cardiovascular Disease

## 2017-01-17 VITALS — BP 118/72 | HR 58 | Ht 62.0 in | Wt 126.5 lb

## 2017-01-17 DIAGNOSIS — Z720 Tobacco use: Secondary | ICD-10-CM

## 2017-01-17 DIAGNOSIS — E782 Mixed hyperlipidemia: Secondary | ICD-10-CM | POA: Diagnosis not present

## 2017-01-17 DIAGNOSIS — I2510 Atherosclerotic heart disease of native coronary artery without angina pectoris: Secondary | ICD-10-CM | POA: Diagnosis not present

## 2017-01-17 DIAGNOSIS — I10 Essential (primary) hypertension: Secondary | ICD-10-CM

## 2017-01-17 NOTE — Progress Notes (Signed)
Cardiology Office Note   Date:  01/17/2017   ID:  Cheryl Hall, DOB 1940/12/11, MRN [id number]  PCP:  Marinda Elk, MD  Cardiologist:   [name], MD   Chief Complaint  Patient presents with  . OTHER    2-3 month f/u back pain. Meds reviewed verbally with pt.      History of Present Illness: Cheryl Hall is a 76 y.o. 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 She has known history of hypertension, GERD hyperlipidemia, ongoing tobacco use and chronic kidney disease stage II .  She presented to [facility] in May of 2016 with a small non-ST elevation myocardial infarction in the setting of uncontrolled hypertension.  Cardiac cath showed mild to moderate two-vessel coronary artery disease involving the LAD and RCA. Ejection fraction was normal. Elevated troponin was felt to be due to high blood pressure.   She had another similar presentation in May of this year and underwent another cardiac catheterization which showed stable nonobstructive disease. Her blood pressure was elevated and this gradually improved with the addition of amlodipine and most recently hydrochlorothiazide by her primary care physician. She is now on 4 antihypertensive medications. She feels well with no chest pain or shortness of breath. She had questionable abdominal aortic dissection on CT but had subsequent follow-up with Dr. dew with duplex that showed no evidence of dissection.  Past Medical History:  Diagnosis Date  . Anemia   . Chronic back pain   . CKD (chronic kidney disease), stage II   . Coronary artery disease    a. 09/2014 NSTEMI/Cath: moderate 2-vessel disease;  b. 09/2016 NSTEMI/Cath: LM 30, LAD 50p, 9m, LCX nl, RCA 24m, 50d, EF 55-65%.  . Diverticulitis   . Essential hypertension   . GERD (gastroesophageal reflux disease)   . H/O Vertebral Fractures   . History of echocardiogram    a. 09/2016 Echo: EF 55-60%, no rwma,  mildly dil Ao root (3.7cm) and Asc Ao (3.9cm), mild to mod TR.  Marland Kitchen History of mania   . History of sciatica   . History of tobacco abuse   . Hyperlipidemia   . Peripheral neuropathy     Past Surgical History:  Procedure Laterality Date  . CARDIAC CATHETERIZATION N/A 10/21/2014   Procedure: Left Heart Cath and Coronary Angiography;  Surgeon: Wellington Hampshire, MD;  Location: [place] [facility];  Service: Cardiovascular;  Laterality: N/A;  . CARDIAC CATHETERIZATION  10/21/2014   Dr. Fletcher Anon  . CHOLECYSTECTOMY    . LEFT HEART CATH AND CORONARY ANGIOGRAPHY N/A 10/09/2016   Procedure: Left Heart Cath and Coronary Angiography;  Surgeon: Nelva Bush, MD;  Location: [place] [facility];  Service: Cardiovascular;  Laterality: N/A;     Current Outpatient Prescriptions  Medication Sig Dispense Refill  . acetaminophen (TYLENOL) 500 MG tablet Take 500 mg by mouth every 6 (six) hours as needed for mild pain or headache.     Marland Kitchen amLODipine (NORVASC) 5 MG tablet Take 1 tablet (5 mg total) by mouth daily. 30 tablet 5  . aspirin EC 81 MG EC tablet Take 1 tablet (81 mg total) by mouth daily. 30 tablet 3  . atorvastatin (LIPITOR) 40 MG tablet Take 1 tablet (40 mg total) by mouth daily at 6 PM. 30 tablet 3  . carvedilol (COREG) 12.5 MG tablet Take 12.5 mg by mouth 2 (two) times daily with a meal.    . Cholecalciferol (VITAMIN  D-3 PO) Take 1 tablet by mouth daily.    . clopidogrel (PLAVIX) 75 MG tablet Take 1 tablet (75 mg total) by mouth daily with breakfast. 30 tablet 3  . ferrous sulfate 325 (65 FE) MG tablet Take 325 mg by mouth daily.    . hydrochlorothiazide (MICROZIDE) 12.5 MG capsule Take 12.5 mg by mouth daily.    Marland Kitchen lisinopril (PRINIVIL,ZESTRIL) 40 MG tablet TAKE ONE TABLET BY MOUTH ONCE DAILY 90 tablet 3  . nitroGLYCERIN (NITROSTAT) 0.4 MG SL tablet Place 0.4 mg under the tongue every 5 (five) minutes as needed for chest pain.    . pantoprazole (PROTONIX) 40 MG tablet Take 1 tablet (40 mg total)  by mouth daily. 30 tablet 5  . vitamin B-12 (CYANOCOBALAMIN) 1000 MCG tablet Take 1,000 mcg by mouth daily.     No current facility-administered medications for this visit.     Allergies:   Patient has no known allergies.    Social History:  The patient  reports that she has been smoking Cigarettes.  She has a 12.50 pack-year smoking history. She has never used smokeless tobacco. She reports that she does not drink alcohol or use drugs.   Family History:  The patient's family history includes CAD in her brother; Cancer in her father; Other in her mother.    ROS:  Please see the history of present illness.   Otherwise, review of systems are positive for none.   All other systems are reviewed and negative.    PHYSICAL EXAM: VS:  BP 118/72 (BP Location: Left Arm, Patient Position: Sitting, Cuff Size: Normal)   Pulse (!) 58   Ht 5\' 2"  (1.575 m)   Wt 126 lb 8 oz (57.4 kg)   BMI 23.14 kg/m  , BMI Body mass index is 23.14 kg/m. GEN: Well nourished, well developed, in no acute distress  HEENT: normal  Neck: no JVD, carotid bruits, or masses Cardiac: RRR; no murmurs, rubs, or gallops,no edema  Respiratory:  clear to auscultation bilaterally, normal work of breathing GI: soft, nontender, nondistended, + BS MS: no deformity or atrophy  Skin: warm and dry, no rash Neuro:  Strength and sensation are intact Psych: euthymic mood, full affect   EKG:  EKG is ordered today. The ekg ordered today demonstrates sinus bradycardia with sinus arrhythmia  Recent Labs: 10/08/2016: ALT 7 10/10/2016: BUN 18; Creatinine, Ser 0.93; Hemoglobin 11.9; Platelets 144; Potassium 4.0; Sodium 141    Lipid Panel    Component Value Date/Time   CHOL 132 10/09/2016 0640   CHOL 149 01/31/2012 0454   TRIG 107 10/09/2016 0640   TRIG 111 01/31/2012 0454   HDL 48 10/09/2016 0640   HDL 58 01/31/2012 0454   CHOLHDL 2.8 10/09/2016 0640   VLDL 21 10/09/2016 0640   VLDL 22 01/31/2012 0454   LDLCALC 63 10/09/2016  0640   LDLCALC 69 01/31/2012 0454      Wt Readings from Last 3 Encounters:  01/17/17 126 lb 8 oz (57.4 kg)  10/31/16 126 lb 8 oz (57.4 kg)  10/09/16 123 lb 9.6 oz (56.1 kg)        ASSESSMENT AND PLAN:  1.  Coronary artery disease involving native coronary arteries without angina:  Recurrent episodes of non-ST elevation myocardial infarction due to demand ischemia in the setting of uncontrolled hypertension. She is currently doing well with no anginal symptoms. The plan is to use Plavix for one year until May 2019.  2. Essential hypertension: Blood pressure is well-controlled on  for blood pressure medications. I am concerned that these episodes of sudden elevation of blood pressure might be related to renal artery stenosis. Thus, I requested renal artery duplex.  3. Hyperlipidemia:Continue treatment with atorvastatin. Most recent LDL was 63.  4. Tobacco use: I again advised her to quit smoking.   Disposition:   FU with me in 6 months  Signed,  [name], MD  01/17/2017 10:22 AM    [place]

## 2017-01-17 NOTE — Patient Instructions (Signed)
Medication Instructions:  Your physician recommends that you continue on your current medications as directed. Please refer to the Current Medication list given to you today.   Labwork: NONE  Testing/Procedures: Your physician has requested that you have a renal artery duplex. During this test, an ultrasound is used to evaluate blood flow to the kidneys. Allow one hour for this exam. Do not eat after midnight the day before and avoid carbonated beverages. Take your medications as you usually do. - END OF September.    Follow-Up: Your physician wants you to follow-up in: [place]. You will receive a reminder letter in the mail two months in advance. If you don't receive a letter, please call our office to schedule the follow-up appointment.   If you need a refill on your cardiac medications before your next appointment, please call your pharmacy.

## 2017-02-04 DIAGNOSIS — R7303 Prediabetes: Secondary | ICD-10-CM | POA: Diagnosis not present

## 2017-02-04 DIAGNOSIS — I2510 Atherosclerotic heart disease of native coronary artery without angina pectoris: Secondary | ICD-10-CM | POA: Diagnosis not present

## 2017-02-04 DIAGNOSIS — M810 Age-related osteoporosis without current pathological fracture: Secondary | ICD-10-CM | POA: Diagnosis not present

## 2017-02-04 DIAGNOSIS — E782 Mixed hyperlipidemia: Secondary | ICD-10-CM | POA: Diagnosis not present

## 2017-02-04 DIAGNOSIS — I10 Essential (primary) hypertension: Secondary | ICD-10-CM | POA: Diagnosis not present

## 2017-02-04 DIAGNOSIS — F419 Anxiety disorder, unspecified: Secondary | ICD-10-CM | POA: Diagnosis not present

## 2017-02-04 DIAGNOSIS — K219 Gastro-esophageal reflux disease without esophagitis: Secondary | ICD-10-CM | POA: Diagnosis not present

## 2017-02-27 DIAGNOSIS — J029 Acute pharyngitis, unspecified: Secondary | ICD-10-CM | POA: Diagnosis not present

## 2017-02-28 ENCOUNTER — Ambulatory Visit (INDEPENDENT_AMBULATORY_CARE_PROVIDER_SITE_OTHER): Payer: Medicare HMO

## 2017-02-28 DIAGNOSIS — I10 Essential (primary) hypertension: Secondary | ICD-10-CM

## 2017-03-07 IMAGING — CR DG CHEST 1V
1 series · 1 of 1 positions shown · non-contrast
Comparison: Single view of the chest 08/12/2012.

CLINICAL DATA: Chest pain and shortness of breath beginning today.

EXAM:
CHEST  1 VIEW

[ap]
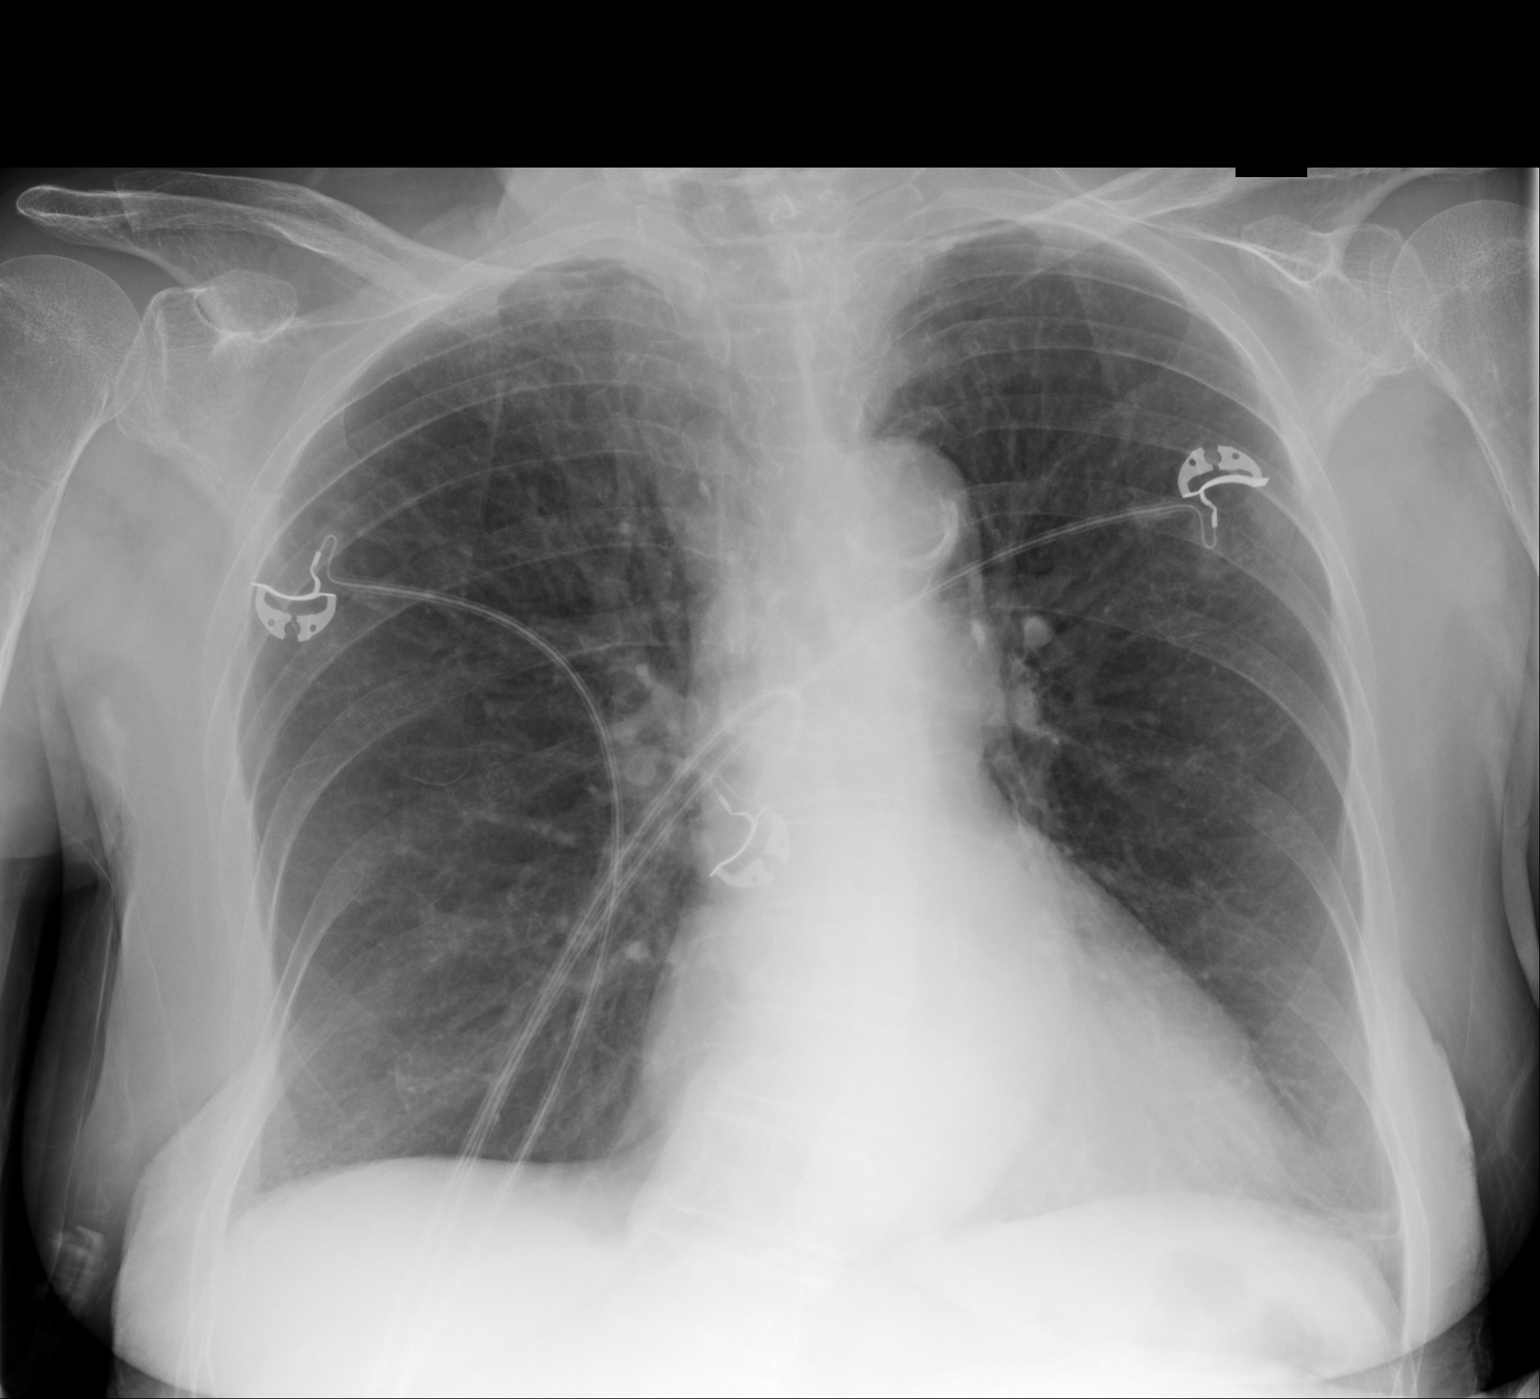

[1 of 1 positions shown; findings below may reference images not displayed]

FINDINGS: Heart size is upper normal. Minimal subsegmental atelectasis is seen
in the left lung base. The lungs are otherwise clear. No
pneumothorax or pleural effusion. Thoracolumbar scoliosis noted.
IMPRESSION: No acute disease.

## 2017-03-12 ENCOUNTER — Emergency Department: Payer: Medicare HMO

## 2017-03-12 ENCOUNTER — Encounter: Payer: Self-pay | Admitting: Emergency Medicine

## 2017-03-12 ENCOUNTER — Emergency Department
Admission: EM | Admit: 2017-03-12 | Discharge: 2017-03-12 | Disposition: A | Discharge status: Home / Self Care | Payer: Medicare HMO | Attending: Emergency Medicine | Admitting: Emergency Medicine

## 2017-03-12 DIAGNOSIS — Z7982 Long term (current) use of aspirin: Secondary | ICD-10-CM | POA: Insufficient documentation

## 2017-03-12 DIAGNOSIS — I129 Hypertensive chronic kidney disease with stage 1 through stage 4 chronic kidney disease, or unspecified chronic kidney disease: Secondary | ICD-10-CM | POA: Insufficient documentation

## 2017-03-12 DIAGNOSIS — R0789 Other chest pain: Secondary | ICD-10-CM | POA: Diagnosis not present

## 2017-03-12 DIAGNOSIS — I2510 Atherosclerotic heart disease of native coronary artery without angina pectoris: Secondary | ICD-10-CM | POA: Diagnosis not present

## 2017-03-12 DIAGNOSIS — R079 Chest pain, unspecified: Secondary | ICD-10-CM | POA: Diagnosis not present

## 2017-03-12 DIAGNOSIS — N182 Chronic kidney disease, stage 2 (mild): Secondary | ICD-10-CM | POA: Insufficient documentation

## 2017-03-12 DIAGNOSIS — F17210 Nicotine dependence, cigarettes, uncomplicated: Secondary | ICD-10-CM | POA: Insufficient documentation

## 2017-03-12 DIAGNOSIS — Z79899 Other long term (current) drug therapy: Secondary | ICD-10-CM | POA: Diagnosis not present

## 2017-03-12 LAB — BASIC METABOLIC PANEL
Anion gap: 9 (ref 5–15)
BUN: 26 mg/dL — ABNORMAL HIGH (ref 6–20)
CALCIUM: 9.2 mg/dL (ref 8.9–10.3)
CO2: 26 mmol/L (ref 22–32)
Chloride: 104 mmol/L (ref 101–111)
Creatinine, Ser: 1.06 mg/dL — ABNORMAL HIGH (ref 0.44–1.00)
GFR calc non Af Amer: 50 mL/min — ABNORMAL LOW (ref >60)
GFR, EST AFRICAN AMERICAN: 58 mL/min — AB (ref >60)
GLUCOSE: 107 mg/dL — AB (ref 65–99)
Potassium: 4.2 mmol/L (ref 3.5–5.1)
SODIUM: 139 mmol/L (ref 135–145)

## 2017-03-12 LAB — CBC
HCT: 36.5 % (ref 35.0–47.0)
HEMOGLOBIN: 12.4 g/dL (ref 12.0–16.0)
MCH: 32 pg (ref 26.0–34.0)
MCHC: 34.1 g/dL (ref 32.0–36.0)
MCV: 93.7 fL (ref 80.0–100.0)
Platelets: 175 10*3/uL (ref 150–440)
RBC: 3.9 MIL/uL (ref 3.80–5.20)
RDW: 14.5 % (ref 11.5–14.5)
WBC: 6.8 10*3/uL (ref 3.6–11.0)

## 2017-03-12 LAB — TROPONIN I: Troponin I: <0.03 ng/mL (ref <0.03)

## 2017-03-12 MED ORDER — ASPIRIN 81 MG PO CHEW
243.0000 mg | CHEWABLE_TABLET | Freq: Once | ORAL | Status: AC
  Administered 2017-03-12: 243 mg via ORAL
  Filled 2017-03-12: qty 3

## 2017-03-12 NOTE — ED Provider Notes (Signed)
[facility] Emergency Department Provider Note  Time seen: 4:47 PM  I have reviewed the triage vital signs and the nursing notes.   HISTORY  Chief Complaint Chest Pain    HPI Cheryl Hall is a 76 y.o. female With a past medical history of CK D, CAD, hypertension, MI, presents to the emergency department for chest pain. Patient states around 12 PM today she developed some central chest pain, took a nitroglycerin tablet and it went away. She states partially one hour later she developed a mild chest discomfort once again took a nitroglycerin tablet and it went away so she came to the emergency department for evaluation. Currently denies any chest pain. Denies any shortness of breath nausea or diaphoresis. Denies any abdominal pain.  Past Medical History:  Diagnosis Date  . Anemia   . Chronic back pain   . CKD (chronic kidney disease), stage II   . Coronary artery disease    a. 09/2014 NSTEMI/Cath: moderate 2-vessel disease;  b. 09/2016 NSTEMI/Cath: LM 30, LAD 50p, 93m, LCX nl, RCA 42m, 50d, EF 55-65%.  . Diverticulitis   . Essential hypertension   . GERD (gastroesophageal reflux disease)   . H/O Vertebral Fractures   . History of echocardiogram    a. 09/2016 Echo: EF 55-60%, no rwma, mildly dil Ao root (3.7cm) and Asc Ao (3.9cm), mild to mod TR.  Marland Kitchen History of mania   . History of sciatica   . History of tobacco abuse   . Hyperlipidemia   . Peripheral neuropathy     Patient Active Problem List   Diagnosis Date Noted  . Moderate tricuspid regurgitation 10/10/2016  . Acute renal insufficiency 10/10/2016  . Hyperglycemia 10/10/2016  . Thrombocytopenia ([place]) 10/10/2016  . CAD in native artery 10/09/2016  . Aortic atherosclerosis ([place]) 09/18/2016  . Colitis   . Lower GI bleed   . Acute colitis 08/18/2016  . Coronary artery disease involving native coronary artery without angina pectoris 11/15/2014  . Hyperlipidemia   . Essential hypertension   . Tobacco  abuse   . CKD (chronic kidney disease), stage II   . GERD (gastroesophageal reflux disease)   . NSTEMI (non-ST elevated myocardial infarction) ([place]) 10/20/2014    Past Surgical History:  Procedure Laterality Date  . CARDIAC CATHETERIZATION N/A 10/21/2014   Procedure: Left Heart Cath and Coronary Angiography;  Surgeon: Wellington Hampshire, MD;  Location: [facility];  Service: Cardiovascular;  Laterality: N/A;  . CARDIAC CATHETERIZATION  10/21/2014   Dr. Fletcher Anon  . CHOLECYSTECTOMY    . LEFT HEART CATH AND CORONARY ANGIOGRAPHY N/A 10/09/2016   Procedure: Left Heart Cath and Coronary Angiography;  Surgeon: Nelva Bush, MD;  Location: [place] [facility];  Service: Cardiovascular;  Laterality: N/A;    Prior to Admission medications   Medication Sig Start Date End Date Taking? Authorizing Provider  acetaminophen (TYLENOL) 500 MG tablet Take 500 mg by mouth every 6 (six) hours as needed for mild pain or headache.     [provider]  amLODipine (NORVASC) 5 MG tablet Take 1 tablet (5 mg total) by mouth daily. 10/31/16 01/29/17  Rogelia Mire, NP  aspirin EC 81 MG EC tablet Take 1 tablet (81 mg total) by mouth daily. 10/11/16   Theodoro Grist, MD  atorvastatin (LIPITOR) 40 MG tablet Take 1 tablet (40 mg total) by mouth daily at 6 PM. 10/10/16   Theodoro Grist, MD  carvedilol (COREG) 12.5 MG tablet Take 12.5 mg by mouth  2 (two) times daily with a meal.    [provider]  Cholecalciferol (VITAMIN D-3 PO) Take 1 tablet by mouth daily.    [provider]  clopidogrel (PLAVIX) 75 MG tablet Take 1 tablet (75 mg total) by mouth daily with breakfast. 10/11/16   Theodoro Grist, MD  ferrous sulfate 325 (65 FE) MG tablet Take 325 mg by mouth daily.    [provider]  hydrochlorothiazide (MICROZIDE) 12.5 MG capsule Take 12.5 mg by mouth daily.    [provider]  lisinopril (PRINIVIL,ZESTRIL) 40 MG tablet TAKE ONE TABLET BY MOUTH ONCE DAILY 04/10/16    Wellington Hampshire, MD  nitroGLYCERIN (NITROSTAT) 0.4 MG SL tablet Place 0.4 mg under the tongue every 5 (five) minutes as needed for chest pain.    [provider]  pantoprazole (PROTONIX) 40 MG tablet Take 1 tablet (40 mg total) by mouth daily. 11/07/16   Rogelia Mire, NP  vitamin B-12 (CYANOCOBALAMIN) 1000 MCG tablet Take 1,000 mcg by mouth daily.    [provider]    No Known Allergies  Family History  Problem Relation Age of Onset  . Other Mother        died @ 42.  . Cancer Father        died of pancreatic cancer  . CAD Brother        alive in late 47's w/ h/o stenting.  . Breast cancer Neg Hx     Social History Social History  Substance Use Topics  . Smoking status: Current Some Day Smoker    Packs/day: 0.25    Years: 50.00    Types: Cigarettes  . Smokeless tobacco: Never Used     Comment: Smoking 2 cigarettes every other day.   . Alcohol use No    Review of Systems Constitutional: Negative for fever. Cardiovascular: chest pain around 12 PM, now resolved Respiratory: Negative for shortness of breath. Gastrointestinal: Negative for abdominal pain,nausea. Musculoskeletal: Negative for leg pain or swelling All other ROS negative  ____________________________________________   PHYSICAL EXAM:  VITAL SIGNS: ED Triage Vitals  Enc Vitals Group     BP 03/12/17 1459 136/75     Pulse Rate 03/12/17 1459 73     Resp 03/12/17 1459 18     Temp 03/12/17 1459 (!) 97.3 F (36.3 C)     Temp Source 03/12/17 1459 Oral     SpO2 03/12/17 1459 96 %     Weight 03/12/17 1500 126 lb (57.2 kg)     Height --      Head Circumference --      Peak Flow --      Pain Score 03/12/17 1459 6     Pain Loc --      Pain Edu? --      Excl. in [place]? --     Constitutional: Alert and oriented. Well appearing and in no distress. Eyes: Normal exam ENT   Head: Normocephalic and atraumatic.   Mouth/Throat: Mucous membranes are moist. Cardiovascular: Normal  rate, regular rhythm. No murmur Respiratory: Normal respiratory effort without tachypnea nor retractions. Breath sounds are clear  Gastrointestinal: Soft and nontender. No distention.  Musculoskeletal: Nontender with normal range of motion in all extremities. No lower extremity tenderness or edema. Neurologic:  Normal speech and language. No gross focal neurologic deficits  Skin:  Skin is warm, dry and intact.  Psychiatric: Mood and affect are normal.   ____________________________________________    EKG  EKG reviewed and interpreted by  myself shows normal sinus rhythm at 66 bpm, narrow QRS, normal axis, normal intervals, nonspecific ST changes without ST elevation.  ____________________________________________    RADIOLOGY  chest x-ray shows no acute abnormality  ____________________________________________   INITIAL IMPRESSION / ASSESSMENT AND PLAN / ED COURSE  Pertinent labs & imaging results that were available during my care of the patient were reviewed by me and considered in my medical decision making (see chart for details).  patient presents to the emergency department for chest pain since around 12 PM, now resolved. Differential this time would include ACS, pneumonia, pneumothorax, chest wall pain, musculoskeletal pain. Patient's labs are normal, troponin is negative, chest x-ray shows no acute findings. EKG is reassuring.  I reviewed the patient's records including her recent discharge summary 10/10/2016 in which she was admitted for an NSTEMI.at that time the patient had a cardiac catheterization with moderate noncritical CAD, but no culprit lesion was identified. No stents were placed. Patient currently takes Plavix, aspirin, Lipitor, and imdur.  patient states she follows up with Dr. Fletcher Anon. Overall the patient appears very well, chest pain-free currently. We'll obtain a repeat troponin, the patient's repeat troponin remains normal and the patient continues to feel well  we will consider discharge home with cardiology follow-up.  patient's repeat troponin is negative. Patient continues to appear very well in the emergency department. We will discharge the patient home with cardiology follow-up. The patient is agreeable to plan. I discussed very strict return precautions for any further chest pain the patient is return immediately to the emergency department.  ____________________________________________   FINAL CLINICAL IMPRESSION(S) / ED DIAGNOSES  chest pain    Harvest Dark, MD 03/12/17 1806

## 2017-03-12 NOTE — ED Triage Notes (Signed)
Pt with centralized chest pain for two hours today.

## 2017-03-12 NOTE — Discharge Instructions (Signed)
You have been seen in the emergency department today for chest pain. Your workup has shown normal results. As we discussed please follow-up with your primary care physician in the next 1-2 days for recheck. Return to the emergency department for any further chest pain, trouble breathing, or any other symptom personally concerning to yourself. °

## 2017-03-14 ENCOUNTER — Telehealth: Payer: Self-pay | Admitting: Cardiovascular Disease

## 2017-03-14 NOTE — Telephone Encounter (Signed)
Called patient to schedule ED fu seen on 03/12/17 for CP  Has seen Dr Fletcher Anon in past Will try again at a later time

## 2017-03-18 ENCOUNTER — Other Ambulatory Visit: Payer: Self-pay

## 2017-03-18 MED ORDER — CLOPIDOGREL BISULFATE 75 MG PO TABS: 75.0000 mg | ORAL_TABLET | Freq: Every day | ORAL | 3 refills | Status: DC

## 2017-04-11 ENCOUNTER — Ambulatory Visit (INDEPENDENT_AMBULATORY_CARE_PROVIDER_SITE_OTHER): Payer: Medicare HMO | Admitting: Physician Assistant

## 2017-04-11 ENCOUNTER — Encounter: Payer: Self-pay | Admitting: Physician Assistant

## 2017-04-11 VITALS — BP 102/60 | HR 76 | Ht 62.0 in | Wt 126.2 lb

## 2017-04-11 DIAGNOSIS — I2510 Atherosclerotic heart disease of native coronary artery without angina pectoris: Secondary | ICD-10-CM

## 2017-04-11 DIAGNOSIS — E782 Mixed hyperlipidemia: Secondary | ICD-10-CM

## 2017-04-11 DIAGNOSIS — I10 Essential (primary) hypertension: Secondary | ICD-10-CM

## 2017-04-11 DIAGNOSIS — Z720 Tobacco use: Secondary | ICD-10-CM

## 2017-04-11 DIAGNOSIS — R0789 Other chest pain: Secondary | ICD-10-CM | POA: Diagnosis not present

## 2017-04-11 NOTE — Patient Instructions (Addendum)
Medication Instructions:  Please continue your current medications   Labwork: None  Testing/Procedures: [employer]  Your caregiver has ordered a Stress Test with nuclear imaging. The purpose of this test is to evaluate the blood supply to your heart muscle. This procedure is referred to as a "Non-Invasive Stress Test." This is because other than having an IV started in your vein, nothing is inserted or "invades" your body. Cardiac stress tests are done to find areas of poor blood flow to the heart by determining the extent of coronary artery disease (CAD). Some patients exercise on a treadmill, which naturally increases the blood flow to your heart, while others who are  unable to walk on a treadmill due to physical limitations have a pharmacologic/chemical stress agent called Lexiscan . This medicine will mimic walking on a treadmill by temporarily increasing your coronary blood flow.   Please note: these test may take anywhere between 2-4 hours to complete  PLEASE REPORT TO [place] AT THE FIRST DESK WILL DIRECT YOU WHERE TO GO  Date of Procedure:_____Monday, January 31_______  Arrival Time for Procedure:___7:15 am_________  Instructions regarding medication:   __X__:  Hold CARVEDILOL the night before and morning of procedure  PLEASE NOTIFY THE OFFICE AT LEAST 24 HOURS IN ADVANCE IF YOU ARE UNABLE TO KEEP YOUR APPOINTMENT.  [phone]  How to prepare for your Myov[facility] test:  1. Do not eat or drink after midnight 2. No caffeine for 24 hours prior to test 3. No smoking 24 hours prior to test. 4. Your medication may be taken with water.  If your doctor stopped a medication because of this test, do not take that medication. 5. Ladies, please do not wear dresses.  Skirts or pants are appropriate. Please wear a short sleeve shirt. 6. No perfume, cologne or lotion.   Follow-Up: 2 months   If you need a refill on your cardiac medications before  your next appointment, please call your pharmacy.  Cardiac Nuclear Scan A cardiac nuclear scan is a test that measures blood flow to the heart when a person is resting and when he or she is exercising. The test looks for problems such as:  Not enough blood reaching a portion of the heart.  The heart muscle not working normally.  You may need this test if:  You have heart disease.  You have had abnormal lab results.  You have had heart surgery or angioplasty.  You have chest pain.  You have shortness of breath.  In this test, a radioactive dye (tracer) is injected into your bloodstream. After the tracer has traveled to your heart, an imaging device is used to measure how much of the tracer is absorbed by or distributed to various areas of your heart. This procedure is usually done at a hospital and takes 2-4 hours. Tell a health care provider about:  Any allergies you have.  All medicines you are taking, including vitamins, herbs, eye drops, creams, and over-the-counter medicines.  Any problems you or family members have had with the use of anesthetic medicines.  Any blood disorders you have.  Any surgeries you have had.  Any medical conditions you have.  Whether you are pregnant or may be pregnant. What are the risks? Generally, this is a safe procedure. However, problems may occur, including:  Serious chest pain and heart attack. This is only a risk if the stress portion of the test is done.  Rapid heartbeat.  Sensation of warmth  in your chest. This usually passes quickly.  What happens before the procedure?  Ask your health care provider about changing or stopping your regular medicines. This is especially important if you are taking diabetes medicines or blood thinners.  Remove your jewelry on the day of the procedure. What happens during the procedure?  An IV tube will be inserted into one of your veins.  Your health care provider will inject a small amount  of radioactive tracer through the tube.  You will wait for 20-40 minutes while the tracer travels through your bloodstream.  Your heart activity will be monitored with an electrocardiogram (ECG).  You will lie down on an exam table.  Images of your heart will be taken for about 15-20 minutes.  You may be asked to exercise on a treadmill or stationary bike. While you exercise, your heart's activity will be monitored with an ECG, and your blood pressure will be checked. If you are unable to exercise, you may be given a medicine to increase blood flow to parts of your heart.  When blood flow to your heart has peaked, a tracer will again be injected through the IV tube.  After 20-40 minutes, you will get back on the exam table and have more images taken of your heart.  When the procedure is over, your IV tube will be removed. The procedure may vary among health care providers and hospitals. Depending on the type of tracer used, scans may need to be repeated 3-4 hours later. What happens after the procedure?  Unless your health care provider tells you otherwise, you may return to your normal schedule, including diet, activities, and medicines.  Unless your health care provider tells you otherwise, you may increase your fluid intake. This will help flush the contrast dye from your body. Drink enough fluid to keep your urine clear or pale yellow.  It is up to you to get your test results. Ask your health care provider, or the department that is doing the test, when your results will be ready. Summary  A cardiac nuclear scan measures the blood flow to the heart when a person is resting and when he or she is exercising.  You may need this test if you are at risk for heart disease.  Tell your health care provider if you are pregnant.  Unless your health care provider tells you otherwise, increase your fluid intake. This will help flush the contrast dye from your body. Drink enough fluid to  keep your urine clear or pale yellow. This information is not intended to replace advice given to you by your health care provider. Make sure you discuss any questions you have with your health care provider. Document Released: 06/08/2004 Document Revised: 05/16/2016 Document Reviewed: 04/22/2013 Elsevier Interactive Patient Education  2017 [employer].

## 2017-04-11 NOTE — Progress Notes (Signed)
Cardiology Office Note Date:  04/11/2017  Patient ID:  Cheryl, Hall 12-08-1940, MRN [id number] PCP:  Marinda Elk, MD  Cardiologist:  Dr. Fletcher Anon, MD    Chief Complaint: ED follow for chest pain  History of Present Illness: Cheryl Hall is a 76 y.o. female with history of mild to moderate nonobstructive CAD, CKD stage II, HTN, HLD, and GERD who presents for ED follow up of chest pain.   She presented to [facility] in May of 2016 with a small non-ST elevation myocardial infarction in the setting of uncontrolled hypertension. Cardiac cath showed mild to moderate two-vessel coronary artery disease with left main 20%, mid LAD 40%, distal LAD 30%, mid RCA 20%. EF was normal with mildly elevated LVEDP. Elevated troponin was felt to be due to high blood pressure. She had another similar presentation in May, 2018, and underwent another cardiac catheterization which showed stable nonobstructive disease. Details include left main 30%, proximal LAD 50%, mid LAD 20%, mid RCA 40%, distal RCA 50%. Echo showed EF 55-60%, no RWMA, normal LV diastolic function, mildly dilated aortic root of 37 mm, mildly dilated ascending aorta of 39 mm, mild MR, mildly dilated left atrium, RV systolic function was normal, PASP normal. Imdur was added, though she did not tolerate this medication due to headaches and it was stopped on 10/31/2016. Her blood pressure was elevated and this gradually improved with the addition of amlodipine and most recently hydrochlorothiazide by her primary care physician. She is now on 4 antihypertensive medications. She had questionable abdominal aortic dissection on CT but had subsequent follow-up with Dr. Lucky Cowboy with duplex that showed no evidence of dissection. She was most recently seen by Dr. Fletcher Anon, MD on 01/17/2017 and doing well at that time. She was seen in the ED on 03/12/2017 for left anterior chest pain that was associated with movement of the left shoulder. Pain came on while she was  watching Gunsmoke. Pain was not responsive to SL NTG x 2. By the time she arrived at the ED, her chest/shoulder pain had self resolved. Work up in the ED showed troponin negative x 2, EKG NSR, 66 bpm, nonspecific anterior and inferior st/t changes, CXR not acute. Vitals stable. Outpatient follow up was advised.  She has been without any chest or shoulder pain since. She recently had to put down her cat that had a brain tumor and had gone blind. This has been weighing on her quite a lot. She feels like this may have been contributing her her above presentation. She denies any current chest pain, SOB, palpitations, nausea, vomiting, diaphoresis, dizziness, presyncope, or syncope. No orthopnea, PND, cough, early satiety, lower extremity edema, or abdominal swelling. Feels well currently and does not have any complaints. Misses her cat.    Past Medical History:  Diagnosis Date  . Anemia   . Chronic back pain   . CKD (chronic kidney disease), stage II   . Coronary artery disease    a. 09/2014 NSTEMI/Cath: moderate 2-vessel disease;  b. 09/2016 NSTEMI/Cath: LM 30, LAD 50p, 84m, LCX nl, RCA 59m, 50d, EF 55-65%.  . Diverticulitis   . Essential hypertension   . GERD (gastroesophageal reflux disease)   . H/O Vertebral Fractures   . History of echocardiogram    a. 09/2016 Echo: EF 55-60%, no rwma, mildly dil Ao root (3.7cm) and Asc Ao (3.9cm), mild to mod TR.  Marland Kitchen History of mania   . History of sciatica   . History of tobacco abuse   .  Hyperlipidemia   . Peripheral neuropathy     Past Surgical History:  Procedure Laterality Date  . CARDIAC CATHETERIZATION N/A 10/21/2014   Procedure: Left Heart Cath and Coronary Angiography;  Surgeon: Wellington Hampshire, MD;  Location: [place] [facility];  Service: Cardiovascular;  Laterality: N/A;  . CARDIAC CATHETERIZATION  10/21/2014   Dr. Fletcher Anon  . CHOLECYSTECTOMY    . LEFT HEART CATH AND CORONARY ANGIOGRAPHY N/A 10/09/2016   Procedure: Left Heart Cath and Coronary  Angiography;  Surgeon: Nelva Bush, MD;  Location: [place] [facility];  Service: Cardiovascular;  Laterality: N/A;    Current Meds  Medication Sig  . acetaminophen (TYLENOL) 500 MG tablet Take 500 mg by mouth every 6 (six) hours as needed for mild pain or headache.   Marland Kitchen amLODipine (NORVASC) 5 MG tablet Take 1 tablet (5 mg total) by mouth daily.  Marland Kitchen aspirin EC 81 MG EC tablet Take 1 tablet (81 mg total) by mouth daily.  Marland Kitchen atorvastatin (LIPITOR) 40 MG tablet Take 1 tablet (40 mg total) by mouth daily at 6 PM.  . carvedilol (COREG) 12.5 MG tablet Take 12.5 mg by mouth 2 (two) times daily with a meal.  . Cholecalciferol (VITAMIN D-3 PO) Take 1 tablet by mouth daily.  . clopidogrel (PLAVIX) 75 MG tablet Take 1 tablet (75 mg total) by mouth daily with breakfast.  . ferrous sulfate 325 (65 FE) MG tablet Take 325 mg by mouth daily.  . hydrochlorothiazide (MICROZIDE) 12.5 MG capsule Take 12.5 mg by mouth daily.  Marland Kitchen lisinopril (PRINIVIL,ZESTRIL) 40 MG tablet TAKE ONE TABLET BY MOUTH ONCE DAILY  . nitroGLYCERIN (NITROSTAT) 0.4 MG SL tablet Place 0.4 mg under the tongue every 5 (five) minutes as needed for chest pain.  . pantoprazole (PROTONIX) 40 MG tablet Take 1 tablet (40 mg total) by mouth daily.  . vitamin B-12 (CYANOCOBALAMIN) 1000 MCG tablet Take 1,000 mcg by mouth daily.    Allergies:   Patient has no known allergies.   Social History:  The patient  reports that she has been smoking cigarettes.  She has a 12.50 pack-year smoking history. she has never used smokeless tobacco. She reports that she does not drink alcohol or use drugs.   Family History:  The patient's family history includes CAD in her brother; Cancer in her father; Other in her mother.  ROS:   Review of Systems  Constitutional: Positive for malaise/fatigue. Negative for chills, diaphoresis, fever and weight loss.  HENT: Negative for congestion.   Eyes: Negative for discharge and redness.  Respiratory: Negative for cough,  hemoptysis, sputum production, shortness of breath and wheezing.   Cardiovascular: Positive for chest pain. Negative for palpitations, orthopnea, claudication, leg swelling and PND.  Gastrointestinal: Negative for abdominal pain, blood in stool, heartburn, melena, nausea and vomiting.  Genitourinary: Negative for hematuria.  Musculoskeletal: Positive for joint pain. Negative for falls and myalgias.       Left shoulder with movement.   Skin: Negative for rash.  Neurological: Negative for dizziness, tingling, tremors, sensory change, speech change, focal weakness, loss of consciousness and weakness.  Endo/Heme/Allergies: Does not bruise/bleed easily.  Psychiatric/Behavioral: Negative for substance abuse. The patient is not nervous/anxious.        Tearful when talking about her cat.      PHYSICAL EXAM:  VS:  BP 102/60 (BP Location: Left Arm, Patient Position: Sitting, Cuff Size: Normal)   Pulse 76   Ht 5\' 2"  (1.575 m)   Wt 126 lb 4 oz (  57.3 kg)   BMI 23.09 kg/m  BMI: Body mass index is 23.09 kg/m.  Physical Exam  Constitutional: She is oriented to person, place, and time. She appears well-developed and well-nourished.  HENT:  Head: Normocephalic and atraumatic.  Eyes: Right eye exhibits no discharge. Left eye exhibits no discharge.  Neck: Normal range of motion. No JVD present.  Cardiovascular: Normal rate, regular rhythm, S1 normal, S2 normal and normal heart sounds. Exam reveals no distant heart sounds, no friction rub, no midsystolic click and no opening snap.  No murmur heard. Pulses:      Posterior tibial pulses are 2+ on the right side, and 2+ on the left side.  Pulmonary/Chest: Effort normal and breath sounds normal. No respiratory distress. She has no decreased breath sounds. She has no wheezes. She has no rales. She exhibits no tenderness.  Abdominal: Soft. She exhibits no distension. There is no tenderness.  Musculoskeletal: She exhibits no edema.  Movement of the left  shoulder fully reproduces the pain she experienced in October prompting her to go to the ED.   Neurological: She is alert and oriented to person, place, and time.  Skin: Skin is warm and dry. No cyanosis. Nails show no clubbing.  Psychiatric: She has a normal mood and affect. Her speech is normal and behavior is normal. Judgment and thought content normal.     EKG:  Was ordered and interpreted by me today. Shows NSR, 76 bpm, sinus arrhythmia, low voltage QRS, nonspecific st/t changes   Recent Labs: 10/08/2016: ALT 7 03/12/2017: BUN 26; Creatinine, Ser 1.06; Hemoglobin 12.4; Platelets 175; Potassium 4.2; Sodium 139  10/09/2016: Cholesterol 132; HDL 48; LDL Cholesterol 63; Total CHOL/HDL Ratio 2.8; Triglycerides 107; VLDL 21   CrCl cannot be calculated (Patient's most recent lab result is older than the maximum 21 days allowed.).   Wt Readings from Last 3 Encounters:  04/11/17 126 lb 4 oz (57.3 kg)  03/12/17 126 lb (57.2 kg)  01/17/17 126 lb 8 oz (57.4 kg)     Other studies reviewed: Additional studies/records reviewed today include: summarized above  ASSESSMENT AND PLAN:  1. Nonobstructive CAD in native coronary arteries without angina: Pain seems to be MSK in etiology given reproducibility with ROM of the left shoulder. Recent nonobstructive LHC in 09/2016 as above. Has been completely asymptomatic since. Can proceed with [facility] if she would like (she would like for this to be in late December given finances). If she does not have any recurrence of pain between now and her scheduled Myoview, she will cancel the stress test. Given she is completely asymptomatic at this time I will not have her start a trial on Ranexa. She has previously been intolerant to Imdur. Suggest she follow up with her PCP for her MSK shoulder pain. Continue ASA, Plavix, Lipitor, and SL NTG.   2. HTN: BP well controlled today. Continue amlodipine.   3. HLD: Continue Lipitor. Recent LDL of 63 in 09/2016.    4. Tobacco abuse: Cessation advised.   Disposition: F/u with me in 2 months.   Current medicines are reviewed at length with the patient today.  The patient did not have any concerns regarding medicines.  Cheryl Banker PA-C 04/11/2017 9:34 AM     [address] [place], [place] [address] [phone]

## 2017-04-15 ENCOUNTER — Other Ambulatory Visit: Payer: Self-pay | Admitting: Cardiovascular Disease

## 2017-04-28 ENCOUNTER — Other Ambulatory Visit: Payer: Self-pay | Admitting: Nurse Practitioner

## 2017-05-27 ENCOUNTER — Ambulatory Visit: Admission: RE | Admit: 2017-05-27 | Payer: Medicare HMO | Source: Ambulatory Visit

## 2017-06-07 ENCOUNTER — Telehealth: Payer: Self-pay | Admitting: *Deleted

## 2017-06-07 NOTE — Telephone Encounter (Signed)
-----   Message from Anselm Pancoast, [place] sent at 06/07/2017  3:37 PM EST ----- Patient no showed for Myoview.  She has a [facility].  I spoke with Christell Faith, PA and he says to cancel follow up with [facility] and r/s the myoview.  Thanks,  Arvilla Market

## 2017-06-07 NOTE — Telephone Encounter (Signed)
Spoke with patient and reviewed that she had not shown up for her scheduled stress test. She reviewed her paperwork with me and stated that it has on there January 31st at 07:15AM. Reviewed and it appears appointment was just scheduled for different date and patient was not aware. Rescheduled stress test for correct date and time that matched her after visit summary. She was agreeable with rescheduling appointment on 06/14/17 with Dr. Fletcher Anon until after her stress test. Will send message to scheduling so they can reach out to reschedule MD appointment until after stress test on 06/27/17. She was appreciative for the call and had no further questions at this time.

## 2017-06-12 NOTE — Telephone Encounter (Signed)
Attempted to cancel r/s appt no ans no vm

## 2017-06-14 ENCOUNTER — Ambulatory Visit: Payer: Self-pay | Admitting: Cardiovascular Disease

## 2017-06-26 NOTE — Progress Notes (Signed)
Cardiology Office Note Date:  06/28/2017  Patient ID:  Cheryl, Hall 05/29/1940, MRN [id number] PCP:  Marinda Elk, MD  Cardiologist:  Dr. Fletcher Anon, MD    Chief Complaint: Follow up  History of Present Illness: Cheryl Hall is a 77 y.o. female with history of mild to moderate nonobstructive CAD, CKD stage II, HTN, HLD, and GERD who presents for follow up of chest pain and Myoview.   She presented to [facility] in May of 2016 with a small non-ST elevation myocardial infarction in the setting of uncontrolled hypertension. Cardiac cath showed mild to moderate two-vessel coronary artery disease with left main 20%, mid LAD 40%, distal LAD 30%, mid RCA 20%. EF was normal with mildly elevated LVEDP. Elevated troponin was felt to be due to high blood pressure. She had another similar presentation in 09/2016, and underwent another cardiac catheterization which showed stable nonobstructive disease. Details include left main 30%, proximal LAD 50%, mid LAD 20%, mid RCA 40%, distal RCA 50%. Echo showed EF 55-60%, no RWMA, normal LV diastolic function, mildly dilated aortic root of 37 mm, mildly dilated ascending aorta of 39 mm, mild MR, mildly dilated left atrium, RV systolic function was normal, PASP normal. Imdur was added, though she did not tolerate this medication due to headaches and it was stopped on 10/31/2016. Her blood pressure was elevated and this gradually improved with the addition of amlodipine and most recently hydrochlorothiazide by her primary care physician. She is now on 4 antihypertensive medications. She had questionable abdominal aortic dissection on CT but had subsequent follow-up with Dr. Lucky Cowboy with duplex that showed no evidence of dissection. She was seen by Dr. Fletcher Anon, MD on 01/17/2017 and doing well at that time. She was seen in the ED on 03/12/2017 for left anterior chest pain that was associated with movement of the left shoulder. Pain came on while she was watching Gunsmoke. Pain was not  responsive to SL NTG x 2. By the time she arrived at the ED, her chest/shoulder pain had self resolved. Work up in the ED showed troponin negative x 2, EKG NSR, 66 bpm, nonspecific anterior and inferior st/t changes, CXR not acute. Vitals stable. In ED follow up on 04/11/17 she had been without further pain. She was sad as she had recently put down her cat that had a brain tumor and had gone blind and that was weighing on her. She felt like her cat may have contributed to her above presentation. Chest/shoulder pain was reproducible with movement of the shoulder. Given her recent nonobstructive LHC in 09/2016, atypical features of her pain, and that she had been symptom free she was offered a stress test; she preferred to delay this stress test until late 04/2017. There was some confusion in scheduling this and her test was not performed until 06/27/2017 which was negative for significant ischemia, EF 78%, no EKG changes concerning for ischemia, overall low risk scan.   She comes in doing well today. No further chest or shoulder pain. No SOB. Blood pressure remains well controlled, not checking BP at home. Only smokes when she is stressed. Continues to miss her cat, though is considering getting a new cat in the near future. Tolerating ASA and Plavix without issues. No concerns at this time.     Past Medical History:  Diagnosis Date  . Anemia   . Chronic back pain   . CKD (chronic kidney disease), stage II   . Coronary artery disease    a.  09/2014 NSTEMI/Cath: moderate 2-vessel disease;  b. 09/2016 NSTEMI/Cath: LM 30, LAD 50p, 23m, LCX nl, RCA 35m, 50d, EF 55-65%.  . Diverticulitis   . Essential hypertension   . GERD (gastroesophageal reflux disease)   . H/O Vertebral Fractures   . History of echocardiogram    a. 09/2016 Echo: EF 55-60%, no rwma, mildly dil Ao root (3.7cm) and Asc Ao (3.9cm), mild to mod TR.  Marland Kitchen History of mania   . History of sciatica   . History of tobacco abuse   . Hyperlipidemia     . Peripheral neuropathy     Past Surgical History:  Procedure Laterality Date  . CARDIAC CATHETERIZATION N/A 10/21/2014   Procedure: Left Heart Cath and Coronary Angiography;  Surgeon: Wellington Hampshire, MD;  Location: [facility];  Service: Cardiovascular;  Laterality: N/A;  . CARDIAC CATHETERIZATION  10/21/2014   Dr. Fletcher Anon  . CHOLECYSTECTOMY    . LEFT HEART CATH AND CORONARY ANGIOGRAPHY N/A 10/09/2016   Procedure: Left Heart Cath and Coronary Angiography;  Surgeon: Nelva Bush, MD;  Location: [place] [facility];  Service: Cardiovascular;  Laterality: N/A;    Current Meds  Medication Sig  . acetaminophen (TYLENOL) 500 MG tablet Take 500 mg by mouth every 6 (six) hours as needed for mild pain or headache.   Marland Kitchen amLODipine (NORVASC) 5 MG tablet TAKE 1 TABLET BY MOUTH ONCE DAILY  . aspirin EC 81 MG EC tablet Take 1 tablet (81 mg total) by mouth daily.  Marland Kitchen atorvastatin (LIPITOR) 40 MG tablet Take 1 tablet (40 mg total) by mouth daily at 6 PM.  . carvedilol (COREG) 12.5 MG tablet Take 12.5 mg by mouth 2 (two) times daily with a meal.  . Cholecalciferol (VITAMIN D-3 PO) Take 1 tablet by mouth daily.  . clopidogrel (PLAVIX) 75 MG tablet Take 1 tablet (75 mg total) by mouth daily with breakfast.  . ferrous sulfate 325 (65 FE) MG tablet Take 325 mg by mouth daily.  . hydrochlorothiazide (MICROZIDE) 12.5 MG capsule Take 12.5 mg by mouth daily.  Marland Kitchen lisinopril (PRINIVIL,ZESTRIL) 40 MG tablet TAKE ONE TABLET BY MOUTH ONCE DAILY  . nitroGLYCERIN (NITROSTAT) 0.4 MG SL tablet Place 0.4 mg under the tongue every 5 (five) minutes as needed for chest pain.  . pantoprazole (PROTONIX) 40 MG tablet Take 1 tablet (40 mg total) by mouth daily.  . vitamin B-12 (CYANOCOBALAMIN) 1000 MCG tablet Take 1,000 mcg by mouth daily.    Allergies:   Patient has no known allergies.   Social History:  The patient  reports that she has been smoking cigarettes.  She has a 12.50 pack-year smoking history. she has  never used smokeless tobacco. She reports that she does not drink alcohol or use drugs.   Family History:  The patient's family history includes CAD in her brother; Cancer in her father; Other in her mother.  ROS:   Review of Systems  Constitutional: Positive for malaise/fatigue. Negative for chills, diaphoresis, fever and weight loss.  HENT: Negative for congestion.   Eyes: Negative for discharge and redness.  Respiratory: Negative for cough, hemoptysis, sputum production, shortness of breath and wheezing.   Cardiovascular: Negative for chest pain, palpitations, orthopnea, claudication, leg swelling and PND.  Gastrointestinal: Negative for abdominal pain, blood in stool, heartburn, melena, nausea and vomiting.  Genitourinary: Negative for hematuria.  Musculoskeletal: Negative for falls and myalgias.  Skin: Negative for rash.  Neurological: Negative for dizziness, tingling, tremors, sensory change, speech change, focal weakness, loss  of consciousness and weakness.  Endo/Heme/Allergies: Does not bruise/bleed easily.  Psychiatric/Behavioral: Negative for substance abuse. The patient is not nervous/anxious.   All other systems reviewed and are negative.    PHYSICAL EXAM:  VS:  BP 110/60 (BP Location: Left Arm, Patient Position: Sitting, Cuff Size: Normal)   Pulse 63   Ht 5\' 2"  (1.575 m)   Wt 126 lb 8 oz (57.4 kg)   BMI 23.14 kg/m  BMI: Body mass index is 23.14 kg/m.  Physical Exam  Constitutional: She is oriented to person, place, and time. She appears well-developed and well-nourished.  HENT:  Head: Normocephalic and atraumatic.  Eyes: Right eye exhibits no discharge. Left eye exhibits no discharge.  Neck: Normal range of motion. No JVD present.  Cardiovascular: Normal rate, regular rhythm, S1 normal, S2 normal and normal heart sounds. Exam reveals no distant heart sounds, no friction rub, no midsystolic click and no opening snap.  No murmur heard. Pulses:      Posterior tibial  pulses are 2+ on the right side, and 2+ on the left side.  Pulmonary/Chest: Effort normal and breath sounds normal. No respiratory distress. She has no decreased breath sounds. She has no wheezes. She has no rales. She exhibits no tenderness.  Abdominal: Soft. She exhibits no distension. There is no tenderness.  Musculoskeletal: She exhibits no edema.  Neurological: She is alert and oriented to person, place, and time.  Skin: Skin is warm and dry. No cyanosis. Nails show no clubbing.  Psychiatric: She has a normal mood and affect. Her speech is normal and behavior is normal. Judgment and thought content normal.     EKG:  Was ordered and interpreted by me today. Shows NSR, 63 bpm, nonspecific st/t changes  Recent Labs: 10/08/2016: ALT 7 03/12/2017: BUN 26; Creatinine, Ser 1.06; Hemoglobin 12.4; Platelets 175; Potassium 4.2; Sodium 139  10/09/2016: Cholesterol 132; HDL 48; LDL Cholesterol 63; Total CHOL/HDL Ratio 2.8; Triglycerides 107; VLDL 21   CrCl cannot be calculated (Patient's most recent lab result is older than the maximum 21 days allowed.).   Wt Readings from Last 3 Encounters:  06/28/17 126 lb 8 oz (57.4 kg)  04/11/17 126 lb 4 oz (57.3 kg)  03/12/17 126 lb (57.2 kg)     Other studies reviewed: Additional studies/records reviewed today include: summarized above  ASSESSMENT AND PLAN:  1. CAD in native coronary arteries without angina: Recent low risk Myoview 05/2017 with nonobstructive CAD by LHC in 09/2016. No further chest or shoulder pain. Continue DAPT with ASA 81 mg daily and Plavix 75 mg daily. Plavix likely to be discontinued 09/2017. No plans for further ischemic evaluation at this time.    2. HTN: Blood pressure is well controlled today in the office. Continue current medications. Check bmet.   3. HLD: LDL from 09/2016 at goal. Continue Lipitor. Plan for follow up fasting lipid and liver function prior to 09/2017 visit. Orders placed.   4. Tobacco abuse: Cessation advised.    5. CKD stage II; Check bmet.  Disposition: F/u with Dr. Fletcher Anon, MD in May, 2019.   Current medicines are reviewed at length with the patient today.  The patient did not have any concerns regarding medicines.  Signed, Christell Faith, PA-C 06/28/2017 1:56 PM     [address] [place], [place] [address] [phone]

## 2017-06-27 ENCOUNTER — Ambulatory Visit
Admission: RE | Admit: 2017-06-27 | Discharge: 2017-06-27 | Disposition: A | Discharge status: Home / Self Care | Payer: Medicare HMO | Source: Ambulatory Visit | Attending: Physician Assistant | Admitting: Physician Assistant

## 2017-06-27 DIAGNOSIS — R0789 Other chest pain: Secondary | ICD-10-CM

## 2017-06-27 LAB — NM MYOCAR MULTI W/SPECT W/WALL MOTION / EF
CHL CUP NUCLEAR SRS: 3
CHL CUP NUCLEAR SSS: 10
CHL CUP RESTING HR STRESS: 72 {beats}/min
CHL CUP STRESS STAGE 1 HR: 62 {beats}/min
CHL CUP STRESS STAGE 2 GRADE: 0 %
CHL CUP STRESS STAGE 2 SPEED: 0 mph
CHL CUP STRESS STAGE 3 GRADE: 0 %
CHL CUP STRESS STAGE 3 SPEED: 0 mph
CHL CUP STRESS STAGE 4 GRADE: 0 %
CHL CUP STRESS STAGE 4 HR: 94 {beats}/min
CHL CUP STRESS STAGE 5 SBP: 122 mmHg
CHL CUP STRESS STAGE 5 SPEED: 0 mph
CSEPHR: 65 %
Estimated workload: 1 METS
LVDIAVOL: 46 mL (ref 46–106)
LVSYSVOL: 13 mL
NUC STRESS TID: 0.89
Peak HR: 95 {beats}/min
Percent of predicted max HR: 65 %
SDS: 8
Stage 1 Grade: 0 %
Stage 1 Speed: 0 mph
Stage 2 HR: 62 {beats}/min
Stage 3 HR: 95 {beats}/min
Stage 4 Speed: 0 mph
Stage 5 DBP: 65 mmHg
Stage 5 Grade: 0 %
Stage 5 HR: 86 {beats}/min

## 2017-06-27 MED ORDER — TECHNETIUM TC 99M TETROFOSMIN IV KIT
13.0000 | PACK | Freq: Once | INTRAVENOUS | Status: AC | PRN
  Administered 2017-06-27: 13 via INTRAVENOUS

## 2017-06-27 MED ORDER — REGADENOSON 0.4 MG/5ML IV SOLN
0.4000 mg | Freq: Once | INTRAVENOUS | Status: AC
  Administered 2017-06-27: 0.4 mg via INTRAVENOUS
  Filled 2017-06-27: qty 5

## 2017-06-27 MED ORDER — TECHNETIUM TC 99M TETROFOSMIN IV KIT
33.1090 | PACK | Freq: Once | INTRAVENOUS | Status: AC | PRN
  Administered 2017-06-27: 33.109 via INTRAVENOUS

## 2017-06-28 ENCOUNTER — Ambulatory Visit: Payer: Medicare HMO | Admitting: Physician Assistant

## 2017-06-28 ENCOUNTER — Encounter: Payer: Self-pay | Admitting: Physician Assistant

## 2017-06-28 VITALS — BP 110/60 | HR 63 | Ht 62.0 in | Wt 126.5 lb

## 2017-06-28 DIAGNOSIS — Z720 Tobacco use: Secondary | ICD-10-CM

## 2017-06-28 DIAGNOSIS — N182 Chronic kidney disease, stage 2 (mild): Secondary | ICD-10-CM

## 2017-06-28 DIAGNOSIS — I2510 Atherosclerotic heart disease of native coronary artery without angina pectoris: Secondary | ICD-10-CM

## 2017-06-28 DIAGNOSIS — E782 Mixed hyperlipidemia: Secondary | ICD-10-CM | POA: Diagnosis not present

## 2017-06-28 DIAGNOSIS — I10 Essential (primary) hypertension: Secondary | ICD-10-CM

## 2017-06-28 NOTE — Patient Instructions (Signed)
Medication Instructions: - Your physician recommends that you continue on your current medications as directed. Please refer to the Current Medication list given to you today.   Labwork: - Your physician recommends that you have lab work today: [employer]  -Your physician recommends that you return for FASTING lab work in: May- just prior to your follow up with Dr. Fletcher Anon (CMET/ Lipid)   Procedures/Testing: - none ordered  Follow-Up: - .Your physician recommends that you schedule a follow-up appointment in: May with Dr. Fletcher Anon.    Any Additional Special Instructions Will Be Listed Below (If Applicable).     If you need a refill on your cardiac medications before your next appointment, please call your pharmacy.

## 2017-06-29 LAB — BASIC METABOLIC PANEL
BUN/Creatinine Ratio: 21 (ref 12–28)
BUN: 22 mg/dL (ref 8–27)
CALCIUM: 9.3 mg/dL (ref 8.7–10.3)
CO2: 23 mmol/L (ref 20–29)
CREATININE: 1.03 mg/dL — AB (ref 0.57–1.00)
Chloride: 106 mmol/L (ref 96–106)
GFR calc Af Amer: 62 mL/min/{1.73_m2} (ref >59)
GFR calc non Af Amer: 54 mL/min/{1.73_m2} — ABNORMAL LOW (ref >59)
GLUCOSE: 83 mg/dL (ref 65–99)
Potassium: 4.5 mmol/L (ref 3.5–5.2)
Sodium: 141 mmol/L (ref 134–144)

## 2017-08-27 DIAGNOSIS — E782 Mixed hyperlipidemia: Secondary | ICD-10-CM | POA: Diagnosis not present

## 2017-08-27 DIAGNOSIS — R7303 Prediabetes: Secondary | ICD-10-CM | POA: Diagnosis not present

## 2017-08-27 DIAGNOSIS — I10 Essential (primary) hypertension: Secondary | ICD-10-CM | POA: Diagnosis not present

## 2017-08-27 DIAGNOSIS — M810 Age-related osteoporosis without current pathological fracture: Secondary | ICD-10-CM | POA: Diagnosis not present

## 2017-09-02 ENCOUNTER — Other Ambulatory Visit: Payer: Medicare HMO

## 2017-09-03 DIAGNOSIS — I10 Essential (primary) hypertension: Secondary | ICD-10-CM | POA: Diagnosis not present

## 2017-09-03 DIAGNOSIS — M549 Dorsalgia, unspecified: Secondary | ICD-10-CM | POA: Diagnosis not present

## 2017-09-03 DIAGNOSIS — K219 Gastro-esophageal reflux disease without esophagitis: Secondary | ICD-10-CM | POA: Diagnosis not present

## 2017-09-03 DIAGNOSIS — E782 Mixed hyperlipidemia: Secondary | ICD-10-CM | POA: Diagnosis not present

## 2017-09-03 DIAGNOSIS — R7303 Prediabetes: Secondary | ICD-10-CM | POA: Diagnosis not present

## 2017-09-03 DIAGNOSIS — Z1231 Encounter for screening mammogram for malignant neoplasm of breast: Secondary | ICD-10-CM | POA: Diagnosis not present

## 2017-09-03 DIAGNOSIS — Z0000 Encounter for general adult medical examination without abnormal findings: Secondary | ICD-10-CM | POA: Diagnosis not present

## 2017-09-03 DIAGNOSIS — I2510 Atherosclerotic heart disease of native coronary artery without angina pectoris: Secondary | ICD-10-CM | POA: Diagnosis not present

## 2017-09-27 ENCOUNTER — Ambulatory Visit: Payer: Self-pay | Admitting: Cardiovascular Disease

## 2017-09-30 ENCOUNTER — Ambulatory Visit: Payer: Medicare HMO | Admitting: Cardiovascular Disease

## 2017-09-30 ENCOUNTER — Encounter: Payer: Self-pay | Admitting: Cardiovascular Disease

## 2017-09-30 VITALS — BP 118/62 | HR 63 | Ht 62.0 in | Wt 127.5 lb

## 2017-09-30 DIAGNOSIS — I2510 Atherosclerotic heart disease of native coronary artery without angina pectoris: Secondary | ICD-10-CM

## 2017-09-30 DIAGNOSIS — E782 Mixed hyperlipidemia: Secondary | ICD-10-CM

## 2017-09-30 DIAGNOSIS — I10 Essential (primary) hypertension: Secondary | ICD-10-CM

## 2017-09-30 DIAGNOSIS — Z720 Tobacco use: Secondary | ICD-10-CM

## 2017-09-30 NOTE — Patient Instructions (Signed)
Medication Instructions:  Your physician has recommended you make the following change in your medication:  1- STOP Plavix.   Labwork: none  Testing/Procedures: none  Follow-Up: Your physician wants you to follow-up in: [place].  You will receive a reminder letter in the mail two months in advance. If you don't receive a letter, please call our office to schedule the follow-up appointment.  If you need a refill on your cardiac medications before your next appointment, please call your pharmacy.

## 2017-09-30 NOTE — Progress Notes (Signed)
Cardiology Office Note   Date:  09/30/2017   ID:  Cheryl Hall, DOB 05-08-1941, MRN [id number]  PCP:  Marinda Elk, MD  Cardiologist:   [name], MD   Chief Complaint  Patient presents with  . OTHER    2 month f/u c/o back pain. Meds reviewed verbally with pt.      History of Present Illness: Cheryl Hall is a 77 y.o. 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 She has known history of hypertension, GERD hyperlipidemia, ongoing tobacco use and chronic kidney disease stage II .  She presented to [facility] in May of 2016 with a small non-ST elevation myocardial infarction in the setting of uncontrolled hypertension.  Cardiac cath showed mild to moderate two-vessel coronary artery disease involving the LAD and RCA. Ejection fraction was normal. Elevated troponin was felt to be due to high blood pressure.   She had another similar presentation in May of 2018 and underwent another cardiac catheterization which showed stable nonobstructive disease. Her blood pressure was elevated and this gradually improved with the addition of amlodipine and  Hydrochlorothiazide. Renal artery duplex in October 2018 showed normal renal arteries.  She has been doing well with no recent chest pain.  She reports stable exertional dyspnea.  She continues to smoke and has not been able to quit.   Past Medical History:  Diagnosis Date  . Anemia   . Chronic back pain   . CKD (chronic kidney disease), stage II   . Coronary artery disease    a. 09/2014 NSTEMI/Cath: moderate 2-vessel disease;  b. 09/2016 NSTEMI/Cath: LM 30, LAD 50p, 69m, LCX nl, RCA 16m, 50d, EF 55-65%.  . Diverticulitis   . Essential hypertension   . GERD (gastroesophageal reflux disease)   . H/O Vertebral Fractures   . History of echocardiogram    a. 09/2016 Echo: EF 55-60%, no rwma, mildly dil Ao root (3.7cm) and Asc Ao (3.9cm), mild to mod TR.  Marland Kitchen History of mania     . History of sciatica   . History of tobacco abuse   . Hyperlipidemia   . Peripheral neuropathy     Past Surgical History:  Procedure Laterality Date  . CARDIAC CATHETERIZATION N/A 10/21/2014   Procedure: Left Heart Cath and Coronary Angiography;  Surgeon: Wellington Hampshire, MD;  Location: [place] [facility];  Service: Cardiovascular;  Laterality: N/A;  . CARDIAC CATHETERIZATION  10/21/2014   Dr. Fletcher Anon  . CHOLECYSTECTOMY    . LEFT HEART CATH AND CORONARY ANGIOGRAPHY N/A 10/09/2016   Procedure: Left Heart Cath and Coronary Angiography;  Surgeon: Nelva Bush, MD;  Location: [place] [facility];  Service: Cardiovascular;  Laterality: N/A;     Current Outpatient Medications  Medication Sig Dispense Refill  . acetaminophen (TYLENOL) 500 MG tablet Take 500 mg by mouth every 6 (six) hours as needed for mild pain or headache.     Marland Kitchen amLODipine (NORVASC) 5 MG tablet TAKE 1 TABLET BY MOUTH ONCE DAILY 30 tablet 10  . aspirin EC 81 MG EC tablet Take 1 tablet (81 mg total) by mouth daily. 30 tablet 3  . atorvastatin (LIPITOR) 40 MG tablet Take 1 tablet (40 mg total) by mouth daily at 6 PM. 30 tablet 3  . carvedilol (COREG) 12.5 MG tablet Take 12.5 mg by mouth 2 (two) times daily with a meal.    . Cholecalciferol (VITAMIN D-3 PO) Take 1 tablet by mouth daily.    Marland Kitchen  clopidogrel (PLAVIX) 75 MG tablet Take 1 tablet (75 mg total) by mouth daily with breakfast. 30 tablet 3  . ferrous sulfate 325 (65 FE) MG tablet Take 325 mg by mouth daily.    . hydrochlorothiazide (MICROZIDE) 12.5 MG capsule Take 12.5 mg by mouth daily.    Marland Kitchen lisinopril (PRINIVIL,ZESTRIL) 40 MG tablet TAKE ONE TABLET BY MOUTH ONCE DAILY 90 tablet 3  . nitroGLYCERIN (NITROSTAT) 0.4 MG SL tablet Place 0.4 mg under the tongue every 5 (five) minutes as needed for chest pain.    . pantoprazole (PROTONIX) 40 MG tablet Take 1 tablet (40 mg total) by mouth daily. 30 tablet 5  . vitamin B-12 (CYANOCOBALAMIN) 1000 MCG tablet Take 1,000 mcg by  mouth daily.     No current facility-administered medications for this visit.     Allergies:   Patient has no known allergies.    Social History:  The patient  reports that she has been smoking cigarettes.  She has a 12.50 pack-year smoking history. She has never used smokeless tobacco. She reports that she does not drink alcohol or use drugs.   Family History:  The patient's family history includes CAD in her brother; Cancer in her father; Other in her mother.    ROS:  Please see the history of present illness.   Otherwise, review of systems are positive for none.   All other systems are reviewed and negative.    PHYSICAL EXAM: VS:  BP 118/62 (BP Location: Left Arm, Patient Position: Sitting, Cuff Size: Normal)   Pulse 63   Ht 5\' 2"  (1.575 m)   Wt 127 lb 8 oz (57.8 kg)   BMI 23.32 kg/m  , BMI Body mass index is 23.32 kg/m. GEN: Well nourished, well developed, in no acute distress  HEENT: normal  Neck: no JVD, carotid bruits, or masses Cardiac: RRR; no murmurs, rubs, or gallops,no edema  Respiratory:  clear to auscultation bilaterally, normal work of breathing GI: soft, nontender, nondistended, + BS MS: no deformity or atrophy  Skin: warm and dry, no rash Neuro:  Strength and sensation are intact Psych: euthymic mood, full affect   EKG:  EKG is ordered today. The ekg ordered today demonstrates sinus rhythm with sinus arrhythmia.  Low voltage.  Recent Labs: 10/08/2016: ALT 7 03/12/2017: Hemoglobin 12.4; Platelets 175 06/28/2017: BUN 22; Creatinine, Ser 1.03; Potassium 4.5; Sodium 141    Lipid Panel    Component Value Date/Time   CHOL 132 10/09/2016 0640   CHOL 149 01/31/2012 0454   TRIG 107 10/09/2016 0640   TRIG 111 01/31/2012 0454   HDL 48 10/09/2016 0640   HDL 58 01/31/2012 0454   CHOLHDL 2.8 10/09/2016 0640   VLDL 21 10/09/2016 0640   VLDL 22 01/31/2012 0454   LDLCALC 63 10/09/2016 0640   LDLCALC 69 01/31/2012 0454      Wt Readings from Last 3 Encounters:   09/30/17 127 lb 8 oz (57.8 kg)  06/28/17 126 lb 8 oz (57.4 kg)  04/11/17 126 lb 4 oz (57.3 kg)        ASSESSMENT AND PLAN:  1.  Coronary artery disease involving native coronary arteries without angina:   She has been doing well with no recent chest pain.  It has been 1 year since her most recent cardiac events.  Thus, I elected to discontinue clopidogrel.  Continue aspirin indefinitely.  2. Essential hypertension: Blood pressure is well-controlled on for blood pressure medications.  No evidence of renal artery stenosis on duplex.  3. Hyperlipidemia:Continue treatment with atorvastatin. Most recent LDL was 63.  4. Tobacco use: I again advised her to quit smoking but she has not been able to quit.   Disposition:   FU with me in 6 months  Signed,  [name], MD  09/30/2017 2:19 PM    [facility]

## 2017-10-23 ENCOUNTER — Other Ambulatory Visit: Payer: Self-pay | Admitting: Nurse Practitioner

## 2017-12-23 DIAGNOSIS — R1032 Left lower quadrant pain: Secondary | ICD-10-CM | POA: Diagnosis not present

## 2017-12-23 DIAGNOSIS — R5381 Other malaise: Secondary | ICD-10-CM | POA: Diagnosis not present

## 2017-12-23 DIAGNOSIS — R5383 Other fatigue: Secondary | ICD-10-CM | POA: Diagnosis not present

## 2017-12-23 DIAGNOSIS — K5900 Constipation, unspecified: Secondary | ICD-10-CM | POA: Diagnosis not present

## 2018-01-01 DIAGNOSIS — M545 Low back pain: Secondary | ICD-10-CM | POA: Diagnosis not present

## 2018-01-01 DIAGNOSIS — K5792 Diverticulitis of intestine, part unspecified, without perforation or abscess without bleeding: Secondary | ICD-10-CM | POA: Diagnosis not present

## 2018-01-06 ENCOUNTER — Other Ambulatory Visit: Payer: Self-pay | Admitting: Physician Assistant

## 2018-01-06 DIAGNOSIS — S32050A Wedge compression fracture of fifth lumbar vertebra, initial encounter for closed fracture: Secondary | ICD-10-CM

## 2018-01-28 ENCOUNTER — Other Ambulatory Visit: Payer: Self-pay

## 2018-01-28 MED ORDER — AMLODIPINE BESYLATE 5 MG PO TABS: 5.0000 mg | ORAL_TABLET | Freq: Every day | ORAL | 5 refills | Status: DC

## 2018-01-29 ENCOUNTER — Ambulatory Visit
Admission: RE | Admit: 2018-01-29 | Discharge: 2018-01-29 | Disposition: A | Discharge status: Home / Self Care | Payer: Medicare HMO | Source: Ambulatory Visit | Attending: Physician Assistant | Admitting: Physician Assistant

## 2018-01-29 DIAGNOSIS — M48061 Spinal stenosis, lumbar region without neurogenic claudication: Secondary | ICD-10-CM | POA: Diagnosis not present

## 2018-01-29 DIAGNOSIS — S32050A Wedge compression fracture of fifth lumbar vertebra, initial encounter for closed fracture: Secondary | ICD-10-CM | POA: Diagnosis present

## 2018-01-29 DIAGNOSIS — M4856XA Collapsed vertebra, not elsewhere classified, lumbar region, initial encounter for fracture: Secondary | ICD-10-CM | POA: Insufficient documentation

## 2018-01-29 DIAGNOSIS — M4186 Other forms of scoliosis, lumbar region: Secondary | ICD-10-CM | POA: Diagnosis not present

## 2018-01-29 DIAGNOSIS — K5730 Diverticulosis of large intestine without perforation or abscess without bleeding: Secondary | ICD-10-CM | POA: Insufficient documentation

## 2018-01-29 DIAGNOSIS — M545 Low back pain: Secondary | ICD-10-CM | POA: Diagnosis not present

## 2018-02-26 DIAGNOSIS — E782 Mixed hyperlipidemia: Secondary | ICD-10-CM | POA: Diagnosis not present

## 2018-02-26 DIAGNOSIS — I10 Essential (primary) hypertension: Secondary | ICD-10-CM | POA: Diagnosis not present

## 2018-02-26 DIAGNOSIS — R7303 Prediabetes: Secondary | ICD-10-CM | POA: Diagnosis not present

## 2018-03-05 DIAGNOSIS — E782 Mixed hyperlipidemia: Secondary | ICD-10-CM | POA: Diagnosis not present

## 2018-03-05 DIAGNOSIS — I10 Essential (primary) hypertension: Secondary | ICD-10-CM | POA: Diagnosis not present

## 2018-03-05 DIAGNOSIS — K219 Gastro-esophageal reflux disease without esophagitis: Secondary | ICD-10-CM | POA: Diagnosis not present

## 2018-03-05 DIAGNOSIS — R7303 Prediabetes: Secondary | ICD-10-CM | POA: Diagnosis not present

## 2018-03-05 DIAGNOSIS — M8000XD Age-related osteoporosis with current pathological fracture, unspecified site, subsequent encounter for fracture with routine healing: Secondary | ICD-10-CM | POA: Diagnosis not present

## 2018-03-05 DIAGNOSIS — Z23 Encounter for immunization: Secondary | ICD-10-CM | POA: Diagnosis not present

## 2018-03-05 DIAGNOSIS — I2510 Atherosclerotic heart disease of native coronary artery without angina pectoris: Secondary | ICD-10-CM | POA: Diagnosis not present

## 2018-03-05 DIAGNOSIS — S32050A Wedge compression fracture of fifth lumbar vertebra, initial encounter for closed fracture: Secondary | ICD-10-CM | POA: Diagnosis not present

## 2018-03-13 DIAGNOSIS — M810 Age-related osteoporosis without current pathological fracture: Secondary | ICD-10-CM | POA: Diagnosis not present

## 2018-03-27 ENCOUNTER — Other Ambulatory Visit: Payer: Self-pay | Admitting: Cardiovascular Disease

## 2018-04-01 ENCOUNTER — Ambulatory Visit: Payer: Medicare HMO | Admitting: Cardiovascular Disease

## 2018-04-01 ENCOUNTER — Encounter: Payer: Self-pay | Admitting: Cardiovascular Disease

## 2018-04-01 VITALS — BP 106/70 | HR 62 | Ht 62.0 in | Wt 129.8 lb

## 2018-04-01 DIAGNOSIS — E785 Hyperlipidemia, unspecified: Secondary | ICD-10-CM

## 2018-04-01 DIAGNOSIS — I10 Essential (primary) hypertension: Secondary | ICD-10-CM | POA: Diagnosis not present

## 2018-04-01 DIAGNOSIS — Z720 Tobacco use: Secondary | ICD-10-CM

## 2018-04-01 DIAGNOSIS — I2510 Atherosclerotic heart disease of native coronary artery without angina pectoris: Secondary | ICD-10-CM

## 2018-04-01 NOTE — Progress Notes (Signed)
Cardiology Office Note   Date:  04/01/2018   ID:  Cheryl Hall, DOB [birth year], MRN [id number]  PCP:  Marinda Elk, MD  Cardiologist:   [name], MD   Chief Complaint  Patient presents with  . other    6 month f/u no complaints today. Meds reviewed verbally with pt.      History of Present Illness: Cheryl Hall is a 77 y.o. 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 She has known history of hypertension, GERD hyperlipidemia, ongoing tobacco use and chronic kidney disease stage II .  She presented to [facility] in May of 2016 with a small non-ST elevation myocardial infarction in the setting of uncontrolled hypertension.  Cardiac cath showed mild to moderate two-vessel coronary artery disease involving the LAD and RCA. Ejection fraction was normal. Elevated troponin was felt to be due to high blood pressure.   She had another similar presentation in May of 2018 and underwent another cardiac catheterization which showed stable nonobstructive disease. Her blood pressure was elevated and this gradually improved with the addition of amlodipine and  Hydrochlorothiazide. Renal artery duplex in October 2018 showed normal renal arteries.  She has been doing extremely well with no recent chest pain, shortness of breath or palpitations.  She was diagnosed with a compression fracture in her back recently.  She takes her medications regularly and blood pressure is now under excellent control.  Past Medical History:  Diagnosis Date  . Anemia   . Chronic back pain   . CKD (chronic kidney disease), stage II   . Coronary artery disease    a. 09/2014 NSTEMI/Cath: moderate 2-vessel disease;  b. 09/2016 NSTEMI/Cath: LM 30, LAD 50p, 15m, LCX nl, RCA 87m, 50d, EF 55-65%.  . Diverticulitis   . Essential hypertension   . GERD (gastroesophageal reflux disease)   . H/O Vertebral Fractures   . History of echocardiogram    a. 09/2016  Echo: EF 55-60%, no rwma, mildly dil Ao root (3.7cm) and Asc Ao (3.9cm), mild to mod TR.  Marland Kitchen History of mania   . History of sciatica   . History of tobacco abuse   . Hyperlipidemia   . Peripheral neuropathy     Past Surgical History:  Procedure Laterality Date  . CARDIAC CATHETERIZATION N/A 10/21/2014   Procedure: Left Heart Cath and Coronary Angiography;  Surgeon: Wellington Hampshire, MD;  Location: [place] [facility];  Service: Cardiovascular;  Laterality: N/A;  . CARDIAC CATHETERIZATION  10/21/2014   Dr. Fletcher Anon  . CHOLECYSTECTOMY    . LEFT HEART CATH AND CORONARY ANGIOGRAPHY N/A 10/09/2016   Procedure: Left Heart Cath and Coronary Angiography;  Surgeon: Nelva Bush, MD;  Location: [place] [facility];  Service: Cardiovascular;  Laterality: N/A;     Current Outpatient Medications  Medication Sig Dispense Refill  . acetaminophen (TYLENOL) 500 MG tablet Take 500 mg by mouth every 6 (six) hours as needed for mild pain or headache.     . alendronate (FOSAMAX) 70 MG tablet TAKE 1 TABLET BY MOUTH ONCE A WEEK. TAKE WITH A FULL GLASS OF WATER. DO NOT LIE DOWN FOR THE NEXT 30 MINUTES  11  . amLODipine (NORVASC) 5 MG tablet Take 1 tablet (5 mg total) by mouth daily. 30 tablet 5  . aspirin EC 81 MG EC tablet Take 1 tablet (81 mg total) by mouth daily. 30 tablet 3  . atorvastatin (LIPITOR) 40 MG tablet Take  1 tablet (40 mg total) by mouth daily at 6 PM. 30 tablet 3  . carvedilol (COREG) 12.5 MG tablet Take 12.5 mg by mouth 2 (two) times daily with a meal.    . Cholecalciferol (VITAMIN D-3 PO) Take 1 tablet by mouth daily.    . ferrous sulfate 325 (65 FE) MG tablet Take 325 mg by mouth daily.    . hydrochlorothiazide (MICROZIDE) 12.5 MG capsule Take 12.5 mg by mouth daily.    Marland Kitchen lisinopril (PRINIVIL,ZESTRIL) 40 MG tablet TAKE 1 TABLET BY MOUTH ONCE DAILY 90 tablet 0  . nitroGLYCERIN (NITROSTAT) 0.4 MG SL tablet Place 0.4 mg under the tongue every 5 (five) minutes as needed for chest pain.    .  pantoprazole (PROTONIX) 40 MG tablet TAKE 1 TABLET BY MOUTH ONCE DAILY 30 tablet 5  . traMADol-acetaminophen (ULTRACET) 37.5-325 MG tablet TAKE 1 2 (ONE HALF) TABLET BY MOUTH EVERY 6 HOURS AS NEEDED FOR PAIN  0  . vitamin B-12 (CYANOCOBALAMIN) 1000 MCG tablet Take 1,000 mcg by mouth daily.     No current facility-administered medications for this visit.     Allergies:   Patient has no known allergies.    Social History:  The patient  reports that she has been smoking cigarettes. She has a 12.50 pack-year smoking history. She has never used smokeless tobacco. She reports that she does not drink alcohol or use drugs.   Family History:  The patient's family history includes CAD in her brother; Cancer in her father; Other in her mother.    ROS:  Please see the history of present illness.   Otherwise, review of systems are positive for none.   All other systems are reviewed and negative.    PHYSICAL EXAM: VS:  BP 106/70 (BP Location: Left Arm, Patient Position: Sitting, Cuff Size: Normal)   Pulse 62   Ht 5\' 2"  (1.575 m)   Wt 129 lb 12 oz (58.9 kg)   BMI 23.73 kg/m  , BMI Body mass index is 23.73 kg/m. GEN: Well nourished, well developed, in no acute distress  HEENT: normal  Neck: no JVD, carotid bruits, or masses Cardiac: RRR; no murmurs, rubs, or gallops,no edema  Respiratory:  clear to auscultation bilaterally, normal work of breathing GI: soft, nontender, nondistended, + BS MS: no deformity or atrophy  Skin: warm and dry, no rash Neuro:  Strength and sensation are intact Psych: euthymic mood, full affect   EKG:  EKG is ordered today. The ekg ordered today demonstrates normal sinus rhythm with no significant ST or T wave changes.  Recent Labs: 06/28/2017: BUN 22; Creatinine, Ser 1.03; Potassium 4.5; Sodium 141    Lipid Panel    Component Value Date/Time   CHOL 132 10/09/2016 0640   CHOL 149 01/31/2012 0454   TRIG 107 10/09/2016 0640   TRIG 111 01/31/2012 0454   HDL 48  10/09/2016 0640   HDL 58 01/31/2012 0454   CHOLHDL 2.8 10/09/2016 0640   VLDL 21 10/09/2016 0640   VLDL 22 01/31/2012 0454   LDLCALC 63 10/09/2016 0640   LDLCALC 69 01/31/2012 0454      Wt Readings from Last 3 Encounters:  04/01/18 129 lb 12 oz (58.9 kg)  09/30/17 127 lb 8 oz (57.8 kg)  06/28/17 126 lb 8 oz (57.4 kg)        ASSESSMENT AND PLAN:  1.  Coronary artery disease involving native coronary arteries without angina: She is doing well overall with no anginal symptoms.  Continue aspirin indefinitely.  Continue medical therapy  2. Essential hypertension: Blood pressure is now under well controlled with current medications.  3. Hyperlipidemia:Continue treatment with atorvastatin. Most recent LDL was 63.  4. Tobacco use: I again advised her to quit smoking but she has not been able to quit.    Disposition:   FU with me in 12 months  Signed,  [name], MD  04/01/2018 1:50 PM    [facility]

## 2018-04-01 NOTE — Patient Instructions (Signed)
Medication Instructions:  No changes  If you need a refill on your cardiac medications before your next appointment, please call your pharmacy.   Lab work: None ordered  Testing/Procedures: None ordered  Follow-Up: At [facility], you and your health needs are our priority.  As part of our continuing mission to provide you with exceptional heart care, we have created designated Provider Care Teams.  These Care Teams include your primary Cardiologist (physician) and Advanced Practice Providers (APPs -  Physician Assistants and Nurse Practitioners) who all work together to provide you with the care you need, when you need it. You will need a follow up appointment in 12 months.  Please call our office 2 months in advance to schedule this appointment.  You may see Dr. Arida or one of the following Advanced Practice Providers on your designated Care Team:   Christopher Berge, NP Ryan Dunn, PA-C . Jacquelyn Visser, PA-C    

## 2018-04-18 ENCOUNTER — Other Ambulatory Visit: Payer: Self-pay | Admitting: *Deleted

## 2018-04-18 MED ORDER — PANTOPRAZOLE SODIUM 40 MG PO TBEC: 40.0000 mg | DELAYED_RELEASE_TABLET | Freq: Every day | ORAL | 5 refills | Status: AC

## 2018-06-25 ENCOUNTER — Other Ambulatory Visit: Payer: Self-pay | Admitting: Cardiovascular Disease

## 2018-07-17 ENCOUNTER — Other Ambulatory Visit: Payer: Self-pay | Admitting: Cardiovascular Disease

## 2018-07-24 DIAGNOSIS — R0781 Pleurodynia: Secondary | ICD-10-CM | POA: Diagnosis not present

## 2018-07-24 DIAGNOSIS — W010XXA Fall on same level from slipping, tripping and stumbling without subsequent striking against object, initial encounter: Secondary | ICD-10-CM | POA: Diagnosis not present

## 2018-07-24 DIAGNOSIS — Y92009 Unspecified place in unspecified non-institutional (private) residence as the place of occurrence of the external cause: Secondary | ICD-10-CM | POA: Diagnosis not present

## 2018-08-29 DIAGNOSIS — I10 Essential (primary) hypertension: Secondary | ICD-10-CM | POA: Diagnosis not present

## 2018-08-29 DIAGNOSIS — R7303 Prediabetes: Secondary | ICD-10-CM | POA: Diagnosis not present

## 2018-08-29 DIAGNOSIS — M810 Age-related osteoporosis without current pathological fracture: Secondary | ICD-10-CM | POA: Diagnosis not present

## 2018-08-29 DIAGNOSIS — M8000XD Age-related osteoporosis with current pathological fracture, unspecified site, subsequent encounter for fracture with routine healing: Secondary | ICD-10-CM | POA: Diagnosis not present

## 2018-08-29 DIAGNOSIS — E782 Mixed hyperlipidemia: Secondary | ICD-10-CM | POA: Diagnosis not present

## 2018-10-17 DIAGNOSIS — M810 Age-related osteoporosis without current pathological fracture: Secondary | ICD-10-CM | POA: Diagnosis not present

## 2018-10-17 DIAGNOSIS — R7303 Prediabetes: Secondary | ICD-10-CM | POA: Diagnosis not present

## 2018-10-17 DIAGNOSIS — K219 Gastro-esophageal reflux disease without esophagitis: Secondary | ICD-10-CM | POA: Diagnosis not present

## 2018-10-17 DIAGNOSIS — E782 Mixed hyperlipidemia: Secondary | ICD-10-CM | POA: Diagnosis not present

## 2018-10-17 DIAGNOSIS — H9193 Unspecified hearing loss, bilateral: Secondary | ICD-10-CM | POA: Diagnosis not present

## 2018-10-17 DIAGNOSIS — Z0000 Encounter for general adult medical examination without abnormal findings: Secondary | ICD-10-CM | POA: Diagnosis not present

## 2018-10-17 DIAGNOSIS — K5790 Diverticulosis of intestine, part unspecified, without perforation or abscess without bleeding: Secondary | ICD-10-CM | POA: Diagnosis not present

## 2018-10-17 DIAGNOSIS — I10 Essential (primary) hypertension: Secondary | ICD-10-CM | POA: Diagnosis not present

## 2018-10-17 DIAGNOSIS — I2510 Atherosclerotic heart disease of native coronary artery without angina pectoris: Secondary | ICD-10-CM | POA: Diagnosis not present

## 2018-10-21 ENCOUNTER — Ambulatory Visit: Payer: Medicare HMO | Admitting: Physician Assistant

## 2018-10-21 ENCOUNTER — Telehealth: Payer: Self-pay | Admitting: Cardiovascular Disease

## 2018-10-21 ENCOUNTER — Ambulatory Visit (INDEPENDENT_AMBULATORY_CARE_PROVIDER_SITE_OTHER): Payer: Medicare HMO | Admitting: Physician Assistant

## 2018-10-21 ENCOUNTER — Encounter: Payer: Self-pay | Admitting: Physician Assistant

## 2018-10-21 ENCOUNTER — Other Ambulatory Visit: Payer: Self-pay

## 2018-10-21 VITALS — BP 120/80 | HR 71 | Temp 97.5°F | Ht 62.0 in | Wt 130.2 lb

## 2018-10-21 DIAGNOSIS — I10 Essential (primary) hypertension: Secondary | ICD-10-CM

## 2018-10-21 DIAGNOSIS — E785 Hyperlipidemia, unspecified: Secondary | ICD-10-CM

## 2018-10-21 DIAGNOSIS — R079 Chest pain, unspecified: Secondary | ICD-10-CM

## 2018-10-21 DIAGNOSIS — Z720 Tobacco use: Secondary | ICD-10-CM

## 2018-10-21 DIAGNOSIS — I2510 Atherosclerotic heart disease of native coronary artery without angina pectoris: Secondary | ICD-10-CM | POA: Diagnosis not present

## 2018-10-21 MED ORDER — ISOSORBIDE MONONITRATE ER 30 MG PO TB24: 15.0000 mg | ORAL_TABLET | Freq: Every day | ORAL | 3 refills | Status: DC

## 2018-10-21 NOTE — Telephone Encounter (Signed)
Attempted to reach patient by dialing number twice. No answer and no VM set up.

## 2018-10-21 NOTE — Progress Notes (Signed)
Cardiology Office Note Date:  10/21/2018  Patient ID:  Cheryl, Hall 09/19/1940, MRN [id number] PCP:  Marinda Elk, MD  Cardiologist:  Dr. Fletcher Anon, MD    Chief Complaint: Chest pain  History of Present Illness: Cheryl Hall is a 78 y.o. female with history of nonobstructive CAD, CKD stage II, HTN, HLD, ongoing tobacco abuse, and GERD who presents for evaluation of chest pain.  Patient was admitted in 09/2014 with a small NSTEMI in the setting of uncontrolled hypertension. LHC showed two-vessel CAD involving the LAD and RCA. EF was normal. She was readmitted in 09/2016 and underwent repeat LHC that showed stable nonobstructive CAD.  Cath details included left main 30% stenosis, proximal LAD 50% stenosis, mid LAD 20% stenosis, LCx angiographically normal, mid RCA 40% stenosis, distal RCA 50% stenosis.  Symptoms were again felt to be related to poorly controlled blood pressure. RAS in 02/2017 showed normal renal arteries. She was seen in 03/2017 with recurrent chest pain and underwent Myoview in 05/2017 that was negative for significant ischemia, EF 78%, low risk scan. She was last seen in the office in 03/2018 and was doing well. BP was well controlled at 106/70.   She called the office this morning noting 2-3 days of intermittent chest pain.   She presents today for in person visit noting development of upper left sided chest pain that is described as an ache, lasting 1 to 2 minutes with spontaneous resolution and occurring at rest.  Pain does not feel similar to prior to prior hospital admissions leading to catheterizations as above.  No associated symptoms.  She last had an episode earlier this morning which lasted approximately 1 minute.  She continues to work on her smoking and is tapering this.  Currently symptom-free.    Past Medical History:  Diagnosis Date  . Anemia   . Chronic back pain   . CKD (chronic kidney disease), stage II   . Coronary artery disease    a. 09/2014  NSTEMI/Cath: moderate 2-vessel disease;  b. 09/2016 NSTEMI/Cath: LM 30, LAD 50p, 2m, LCX nl, RCA 58m, 50d, EF 55-65%.  . Diverticulitis   . Essential hypertension   . GERD (gastroesophageal reflux disease)   . H/O Vertebral Fractures   . History of echocardiogram    a. 09/2016 Echo: EF 55-60%, no rwma, mildly dil Ao root (3.7cm) and Asc Ao (3.9cm), mild to mod TR.  Marland Kitchen History of mania   . History of sciatica   . History of tobacco abuse   . Hyperlipidemia   . Peripheral neuropathy     Past Surgical History:  Procedure Laterality Date  . CARDIAC CATHETERIZATION N/A 10/21/2014   Procedure: Left Heart Cath and Coronary Angiography;  Surgeon: Wellington Hampshire, MD;  Location: [place] [facility];  Service: Cardiovascular;  Laterality: N/A;  . CARDIAC CATHETERIZATION  10/21/2014   Dr. Fletcher Anon  . CHOLECYSTECTOMY    . LEFT HEART CATH AND CORONARY ANGIOGRAPHY N/A 10/09/2016   Procedure: Left Heart Cath and Coronary Angiography;  Surgeon: Nelva Bush, MD;  Location: [place] [facility];  Service: Cardiovascular;  Laterality: N/A;    Current Meds  Medication Sig  . acetaminophen (TYLENOL) 500 MG tablet Take 500 mg by mouth every 6 (six) hours as needed for mild pain or headache.   . alendronate (FOSAMAX) 70 MG tablet TAKE 1 TABLET BY MOUTH ONCE A WEEK. TAKE WITH A FULL GLASS OF WATER. DO NOT LIE DOWN FOR THE NEXT 30 MINUTES  .  amLODipine (NORVASC) 5 MG tablet TAKE 1 TABLET BY MOUTH ONCE DAILY  . aspirin EC 81 MG EC tablet Take 1 tablet (81 mg total) by mouth daily.  Marland Kitchen atorvastatin (LIPITOR) 40 MG tablet Take 1 tablet (40 mg total) by mouth daily at 6 PM.  . carvedilol (COREG) 12.5 MG tablet Take 12.5 mg by mouth 2 (two) times daily with a meal.  . Cholecalciferol (VITAMIN D-3 PO) Take 1,000 Int'l Units by mouth daily.   . ferrous sulfate 325 (65 FE) MG tablet Take 325 mg by mouth daily.  . hydrochlorothiazide (MICROZIDE) 12.5 MG capsule Take 12.5 mg by mouth daily.  Marland Kitchen lisinopril  (PRINIVIL,ZESTRIL) 40 MG tablet TAKE 1 TABLET BY MOUTH ONCE DAILY  . nitroGLYCERIN (NITROSTAT) 0.4 MG SL tablet Place 0.4 mg under the tongue every 5 (five) minutes as needed for chest pain.  . pantoprazole (PROTONIX) 40 MG tablet Take 1 tablet (40 mg total) by mouth daily.  . traMADol-acetaminophen (ULTRACET) 37.5-325 MG tablet TAKE 1 2 (ONE HALF) TABLET BY MOUTH EVERY 6 HOURS AS NEEDED FOR PAIN  . vitamin B-12 (CYANOCOBALAMIN) 1000 MCG tablet Take 1,000 mcg by mouth daily.    Allergies:   Patient has no known allergies.   Social History:  The patient  reports that she has been smoking cigarettes. She has a 12.50 pack-year smoking history. She has never used smokeless tobacco. She reports that she does not drink alcohol or use drugs.   Family History:  The patient's family history includes CAD in her brother; Cancer in her father; Other in her mother.  ROS:   Review of Systems  Constitutional: Positive for malaise/fatigue. Negative for chills, diaphoresis, fever and weight loss.  HENT: Negative for congestion.   Eyes: Negative for discharge and redness.  Respiratory: Negative for cough, hemoptysis, sputum production, shortness of breath and wheezing.   Cardiovascular: Positive for chest pain. Negative for palpitations, orthopnea, claudication, leg swelling and PND.  Gastrointestinal: Negative for abdominal pain, blood in stool, heartburn, melena, nausea and vomiting.  Genitourinary: Negative for hematuria.  Musculoskeletal: Negative for falls and myalgias.  Skin: Negative for rash.  Neurological: Negative for dizziness, tingling, tremors, sensory change, speech change, focal weakness, loss of consciousness and weakness.  Endo/Heme/Allergies: Does not bruise/bleed easily.  Psychiatric/Behavioral: Negative for substance abuse. The patient is not nervous/anxious.   All other systems reviewed and are negative.    PHYSICAL EXAM:  VS:  BP 120/80 (BP Location: Left Arm, Patient Position:  Sitting, Cuff Size: Normal)   Pulse 71   Temp (!) 97.5 F (36.4 C)   Ht 5\' 2"  (1.575 m)   Wt 130 lb 4 oz (59.1 kg)   SpO2 97%   BMI 23.82 kg/m  BMI: Body mass index is 23.82 kg/m.  Physical Exam  Constitutional: She is oriented to person, place, and time. She appears well-developed and well-nourished.  HENT:  Head: Normocephalic and atraumatic.  Eyes: Right eye exhibits no discharge. Left eye exhibits no discharge.  Neck: Normal range of motion. No JVD present.  Cardiovascular: Normal rate, regular rhythm, S1 normal, S2 normal and normal heart sounds. Exam reveals no distant heart sounds, no friction rub, no midsystolic click and no opening snap.  No murmur heard. Pulses:      Posterior tibial pulses are 2+ on the right side and 2+ on the left side.  Palpation of the anterior chest wall reproduces her symptoms  Pulmonary/Chest: Effort normal and breath sounds normal. No respiratory distress. She has no decreased  breath sounds. She has no wheezes. She has no rales. She exhibits no tenderness.  Abdominal: Soft. She exhibits no distension. There is no abdominal tenderness.  Musculoskeletal:        General: No edema.  Neurological: She is alert and oriented to person, place, and time.  Skin: Skin is warm and dry. No cyanosis. Nails show no clubbing.  Psychiatric: She has a normal mood and affect. Her speech is normal and behavior is normal. Judgment and thought content normal.     EKG:  Was ordered and interpreted by me today. Shows NSR, 64 bpm, nonspecific inferior ST-T changes  Recent Labs: No results found for requested labs within last 8760 hours.  No results found for requested labs within last 8760 hours.   CrCl cannot be calculated (Patient's most recent lab result is older than the maximum 21 days allowed.).   Wt Readings from Last 3 Encounters:  10/21/18 130 lb 4 oz (59.1 kg)  04/01/18 129 lb 12 oz (58.9 kg)  09/30/17 127 lb 8 oz (57.8 kg)     Other studies  reviewed: Additional studies/records reviewed today include: summarized above  ASSESSMENT AND PLAN:  1. CAD involving the native coronary arteries with chest pain moderate risk for cardiac etiology: Currently symptom-free.  Symptoms are somewhat atypical for ACS given short duration and nonexertional component.  However, she does continue to smoke which certainly may accelerate atherosclerosis.  Discussed evaluation modalities including invasive and noninvasive.  We will begin with a Lexiscan Myoview to evaluate for high risk ischemia.  Start Imdur 15 mg daily.  Otherwise, she will continue aspirin, amlodipine, Norvasc, and Lipitor.  Aggressive risk factor modification including smoking cessation is recommended.  2. Hypertension: Blood pressures well controlled today.  Continue current medications.  3. Hyperlipidemia: Most recent LDL of 63.  Continue Lipitor.  In follow-up she will need fasting lipid panel.  4. Tobacco abuse: Complete cessation is recommended.  Disposition: F/u with Dr. Fletcher Anon or an APP in 1 month.  Current medicines are reviewed at length with the patient today.  The patient did not have any concerns regarding medicines.  Signed, Christell Faith, PA-C 10/21/2018 4:09 PM     [address] [place], [place] [address] [phone]

## 2018-10-21 NOTE — Progress Notes (Deleted)
{Choose 1 Note Type (Telehealth Visit or Telephone Visit):[phone]}   Date:  10/21/2018   ID:  Cheryl Hall, DOB 07-May-1941, MRN [id number]  {Patient Location:[phone]::"Home"} {Provider Location:[phone]::"Home"}  PCP:  Cheryl Elk, MD  Cardiologist:  Cheryl Sacramento, MD  Electrophysiologist:  None   Evaluation Performed:  {Choose Visit Type:[phone]::"Follow-Up Visit"}  Chief Complaint:  Chest pain  History of Present Illness:    Cheryl Hall is a 78 y.o. female with moderate nonobstructive CAD, CKD stage II, HTN, HLD, ongoing tobacco abuse, and GERD who presents for ***.  Patient was admitted in 09/2014 with a small NSTEMI in the setting of uncontrolled hypertension. LHC showed two-vessel CAD involving the LAD and RCA. EF was normal. She was readmitted in 09/2016 and underwent repeat LHC that showed stable nonobstructive CAD. Symptoms were again felt to be related to poorly controlled blood pressure. RAS in 02/2017 showed normal renal arteries. She was seen in 03/2017 with recurrent chest pain and underwent Myoview in 05/2017 that was negative for significant ischemia, EF 78%, low risk scan. She was last seen in the office in 03/2018 and was doing well. BP was well controlled at 106/70.   She called the office this morning noting 2-3 days of intermittent chest pain.   ***  The patient {does/does not:200015} have symptoms concerning for COVID-19 infection (fever, chills, cough, or new shortness of breath).    Past Medical History:  Diagnosis Date  . Anemia   . Chronic back pain   . CKD (chronic kidney disease), stage II   . Coronary artery disease    a. 09/2014 NSTEMI/Cath: moderate 2-vessel disease;  b. 09/2016 NSTEMI/Cath: LM 30, LAD 50p, 62m, LCX nl, RCA 11m, 50d, EF 55-65%.  . Diverticulitis   . Essential hypertension   . GERD (gastroesophageal reflux disease)   . H/O Vertebral Fractures   . History of echocardiogram    a. 09/2016 Echo: EF 55-60%, no rwma, mildly  dil Ao root (3.7cm) and Asc Ao (3.9cm), mild to mod TR.  Marland Kitchen History of mania   . History of sciatica   . History of tobacco abuse   . Hyperlipidemia   . Peripheral neuropathy    Past Surgical History:  Procedure Laterality Date  . CARDIAC CATHETERIZATION N/A 10/21/2014   Procedure: Left Heart Cath and Coronary Angiography;  Surgeon: Cheryl Hampshire, MD;  Location: [facility];  Service: Cardiovascular;  Laterality: N/A;  . CARDIAC CATHETERIZATION  10/21/2014   Dr. Fletcher Hall  . CHOLECYSTECTOMY    . LEFT HEART CATH AND CORONARY ANGIOGRAPHY N/A 10/09/2016   Procedure: Left Heart Cath and Coronary Angiography;  Surgeon: Cheryl Bush, MD;  Location: [facility];  Service: Cardiovascular;  Laterality: N/A;     No outpatient medications have been marked as taking for the 10/21/18 encounter (Appointment) with Cheryl Mu, PA-C.     Allergies:   Patient has no known allergies.   Social History   Tobacco Use  . Smoking status: Current Some Day Smoker    Packs/day: 0.25    Years: 50.00    Pack years: 12.50    Types: Cigarettes  . Smokeless tobacco: Never Used  . Tobacco comment: Smoking 2 cigarettes every other day.   Substance Use Topics  . Alcohol use: No    Alcohol/week: 0.0 standard drinks  . Drug use: No     Family Hx: The patient's family history includes CAD in her brother; Cancer in her father; Other in her mother.  There is no history of Breast cancer.  ROS:   Please see the history of present illness.     All other systems reviewed and are negative.   Prior CV studies:   The following studies were reviewed today:  Myoview 05/2017: Pharmacological myocardial perfusion imaging study with no significant  ischemia Normal wall motion, EF estimated at 78% No EKG changes concerning for ischemia at peak stress or in recovery. Low risk scan __________  [facility] 09/2016: Conclusions: 1. Moderate, non-critical coronary artery disease involving the distal LMCA,  LAD, and RCA. No culprit lesion is evident for patient's NSTEMI. 2. Normal left ventricular systolic function and filling pressure.  Recommendations: 1. Medical therapy, including dual antiplatelet therapy with aspirin and clopidogrel for up to 12 months, as tolerated. 2. Add isosorbide mononitrate for antianginal therapy and blood pressure control. 3. Aggressive secondary prevention, including high-intensity statin therapy. 4. Monitor overnight, given medical management of NSTEMI. 5. Follow-up as an outpatient with Dr. Fletcher Hall. __________  2D Echo 09/2016: - Left ventricle: The cavity size was normal. Systolic function was   normal. The estimated ejection fraction was in the range of 55%   to 60%. Wall motion was normal; there were no regional wall   motion abnormalities. Left ventricular diastolic function   parameters were normal. - Aortic valve: Trileaflet; normal thickness leaflets. - Aortic root: The aortic root was mildly dilated, 3.7 cm - Ascending aorta: The ascending aorta was mildly dilated,   estimated at 3.9 cm - Mitral valve: There was mild regurgitation. - Left atrium: The atrium was mildly dilated. - Right ventricle: Systolic function was normal. - Tricuspid valve: There was mild-moderate regurgitation. - Pulmonary arteries: Systolic pressure was within the normal   range.  Labs/Other Tests and Data Reviewed:    EKG:  {EKG/Telemetry Strips [phone]}  Recent Labs: No results found for requested labs within last 8760 hours.   Recent Lipid Panel Lab Results  Component Value Date/Time   CHOL 132 10/09/2016 06:40 AM   CHOL 149 01/31/2012 04:54 AM   TRIG 107 10/09/2016 06:40 AM   TRIG 111 01/31/2012 04:54 AM   HDL 48 10/09/2016 06:40 AM   HDL 58 01/31/2012 04:54 AM   CHOLHDL 2.8 10/09/2016 06:40 AM   LDLCALC 63 10/09/2016 06:40 AM   LDLCALC 69 01/31/2012 04:54 AM    Wt Readings from Last 3 Encounters:  04/01/18 129 lb 12 oz (58.9 kg)  09/30/17 127  lb 8 oz (57.8 kg)  06/28/17 126 lb 8 oz (57.4 kg)     Objective:    Vital Signs:  There were no vitals taken for this visit.   {HeartCare Virtual Exam (Optional):[id number]::"VITAL SIGNS:  reviewed"}  ASSESSMENT & PLAN:    1. ***  COVID-19 Education: The signs and symptoms of COVID-19 were discussed with the patient and how to seek care for testing (follow up with PCP or arrange E-visit).  The importance of social distancing was discussed today.  Time:   Today, I have spent *** minutes with the patient with telehealth technology discussing the above problems.     Medication Adjustments/Labs and Tests Ordered: Current medicines are reviewed at length with the patient today.  Concerns regarding medicines are outlined above.   Tests Ordered: No orders of the defined types were placed in this encounter.   Medication Changes: No orders of the defined types were placed in this encounter.   Disposition:  Follow up {follow up:15908}  Signed, Christell Faith, PA-C  10/21/2018 9:34 AM  [facility]

## 2018-10-21 NOTE — Patient Instructions (Signed)
Medication Instructions:  Your physician has recommended you make the following change in your medication:  1- START Imdur Take 0.5 tablets (15 mg total) by mouth daily If you need a refill on your cardiac medications before your next appointment, please call your pharmacy.   Lab work: None ordered  If you have labs (blood work) drawn today and your tests are completely normal, you will receive your results only by: Marland Kitchen MyChart Message (if you have MyChart) OR . A paper copy in the mail If you have any lab test that is abnormal or we need to change your treatment, we will call you to review the results.  Testing/Procedures: 1- [place]  Your caregiver has ordered a Stress Test with nuclear imaging. The purpose of this test is to evaluate the blood supply to your heart muscle. This procedure is referred to as a "Non-Invasive Stress Test." This is because other than having an IV started in your vein, nothing is inserted or "invades" your body. Cardiac stress tests are done to find areas of poor blood flow to the heart by determining the extent of coronary artery disease (CAD). Some patients exercise on a treadmill, which naturally increases the blood flow to your heart, while others who are  unable to walk on a treadmill due to physical limitations have a pharmacologic/chemical stress agent called Lexiscan . This medicine will mimic walking on a treadmill by temporarily increasing your coronary blood flow.   Please note: these test may take anywhere between 2-4 hours to complete  PLEASE REPORT TO [facility] AT THE FIRST DESK WILL DIRECT YOU WHERE TO GO  Date of Procedure:_____________________________________  Arrival Time for Procedure:______________________________  Instructions regarding medication:   _x____:  Hold betablocker(s) night before procedure and morning of procedure (Carvedilol)  __x__:  Hold other medications as follows:___the morning of    HOLD_fluid pill____hydrochlorothiazide___________________  PLEASE NOTIFY THE OFFICE AT LEAST 24 HOURS IN ADVANCE IF YOU ARE UNABLE TO KEEP YOUR APPOINTMENT.  [phone] AND  PLEASE NOTIFY NUCLEAR MEDICINE AT [facility] AT LEAST 24 HOURS IN ADVANCE IF YOU ARE UNABLE TO KEEP YOUR APPOINTMENT. [phone]  How to prepare for your Myoview test:  1. Do not eat or drink after midnight 2. No caffeine for 24 hours prior to test 3. No smoking 24 hours prior to test. 4. Your medication may be taken with water.  If your doctor stopped a medication because of this test, do not take that medication. 5. Ladies, please do not wear dresses.  Skirts or pants are appropriate. Please wear a short sleeve shirt. 6. No perfume, cologne or lotion. 7. Wear comfortable walking shoes. No heels!   Follow-Up: At [facility], you and your health needs are our priority.  As part of our continuing mission to provide you with exceptional heart care, we have created designated Provider Care Teams.  These Care Teams include your primary Cardiologist (physician) and Advanced Practice Providers (APPs -  Physician Assistants and Nurse Practitioners) who all work together to provide you with the care you need, when you need it. You will need a follow up (virtual) appointment in 1 months. You may see [name], MD or Christell Faith, PA-C.

## 2018-10-21 NOTE — Telephone Encounter (Signed)
Attempted to call pt at the request of provider to switch appt to in person, rather than e visit d/t pt needing EKG.   No answer, no VM available. Needs prescreening.

## 2018-10-21 NOTE — Telephone Encounter (Signed)
Virtual Visit Pre-Appointment Phone Call  "(Name), I am calling you today to discuss your upcoming appointment. We are currently trying to limit exposure to the virus that causes COVID-19 by seeing patients at home rather than in the office."  1. "What is the BEST phone number to call the day of the visit?" - include this in appointment notes  2. Do you have or have access to (through a family member/friend) a smartphone with video capability that we can use for your visit?" a. If yes - list this number in appt notes as cell (if different from BEST phone #) and list the appointment type as a VIDEO visit in appointment notes b. If no - list the appointment type as a PHONE visit in appointment notes  3. Confirm consent - "In the setting of the current Covid19 crisis, you are scheduled for a (phone or video) visit with your provider on (date) at (time).  Just as we do with many in-office visits, in order for you to participate in this visit, we must obtain consent.  If you'd like, I can send this to your mychart (if signed up) or email for you to review.  Otherwise, I can obtain your verbal consent now.  All virtual visits are billed to your insurance company just like a normal visit would be.  By agreeing to a virtual visit, we'd like you to understand that the technology does not allow for your provider to perform an examination, and thus may limit your provider's ability to fully assess your condition. If your provider identifies any concerns that need to be evaluated in person, we will make arrangements to do so.  Finally, though the technology is pretty good, we cannot assure that it will always work on either your or our end, and in the setting of a video visit, we may have to convert it to a phone-only visit.  In either situation, we cannot ensure that we have a secure connection.  Are you willing to proceed?" STAFF: Did the patient verbally acknowledge consent to telehealth visit? Document  YES/NO here: YES   4. Advise patient to be prepared - "Two hours prior to your appointment, go ahead and check your blood pressure, pulse, oxygen saturation, and your weight (if you have the equipment to check those) and write them all down. When your visit starts, your provider will ask you for this information. If you have an Apple Watch or Kardia device, please plan to have heart rate information ready on the day of your appointment. Please have a pen and paper handy nearby the day of the visit as well."  5. Give patient instructions for MyChart download to smartphone OR Doximity/Doxy.me as below if video visit (depending on what platform provider is using)  6. Inform patient they will receive a phone call 15 minutes prior to their appointment time (may be from unknown caller ID) so they should be prepared to answer    Cheryl Hall has been deemed a candidate for a follow-up tele-health visit to limit community exposure during the Covid-19 pandemic. I spoke with the patient via phone to ensure availability of phone/video source, confirm preferred email & phone number, and discuss instructions and expectations.  I reminded Cheryl Hall to be prepared with any vital sign and/or heart rhythm information that could potentially be obtained via home monitoring, at the time of her visit. I reminded Cheryl Hall to expect a phone call prior  to her visit.  Cheryl Hall 10/21/2018 9:30 AM   INSTRUCTIONS FOR DOWNLOADING THE MYCHART APP TO SMARTPHONE  - The patient must first make sure to have activated MyChart and know their login information - If Apple, go to [employer] and type in MyChart in the search bar and download the app. If Android, ask patient to go to [employer] and type in [place] in the search bar and download the app. The app is free but as with any other app downloads, their phone may require them to verify saved payment information or Apple/Android  password.  - The patient will need to then log into the app with their MyChart username and password, and select [facility] as their healthcare provider to link the account. When it is time for your visit, go to the MyChart app, find appointments, and click Begin Video Visit. Be sure to Select Allow for your device to access the Microphone and Camera for your visit. You will then be connected, and your provider will be with you shortly.  **If they have any issues connecting, or need assistance please contact MyChart service desk [phone])**  **If using a computer, in order to ensure the best quality for their visit they will need to use either of the following Internet Browsers: [employer], or Google Chrome**  IF USING DOXIMITY or DOXY.ME - The patient will receive a link just prior to their visit by text.     FULL LENGTH CONSENT FOR TELE-HEALTH VISIT   I hereby voluntarily request, consent and authorize [facility] and its employed or contracted physicians, physician assistants, nurse practitioners or other licensed health care professionals (the Practitioner), to provide me with telemedicine health care services (the Services") as deemed necessary by the treating Practitioner. I acknowledge and consent to receive the Services by the Practitioner via telemedicine. I understand that the telemedicine visit will involve communicating with the Practitioner through live audiovisual communication technology and the disclosure of certain medical information by electronic transmission. I acknowledge that I have been given the opportunity to request an in-person assessment or other available alternative prior to the telemedicine visit and am voluntarily participating in the telemedicine visit.  I understand that I have the right to withhold or withdraw my consent to the use of telemedicine in the course of my care at any time, without affecting my right to future care or treatment,  and that the Practitioner or I may terminate the telemedicine visit at any time. I understand that I have the right to inspect all information obtained and/or recorded in the course of the telemedicine visit and may receive copies of available information for a reasonable fee.  I understand that some of the potential risks of receiving the Services via telemedicine include:   Delay or interruption in medical evaluation due to technological equipment failure or disruption;  Information transmitted may not be sufficient (e.g. poor resolution of images) to allow for appropriate medical decision making by the Practitioner; and/or   In rare instances, security protocols could fail, causing a breach of personal health information.  Furthermore, I acknowledge that it is my responsibility to provide information about my medical history, conditions and care that is complete and accurate to the best of my ability. I acknowledge that Practitioner's advice, recommendations, and/or decision may be based on factors not within their control, such as incomplete or inaccurate data provided by me or distortions of diagnostic images or specimens that may result from electronic transmissions.  I understand that the practice of medicine is not an exact science and that Practitioner makes no warranties or guarantees regarding treatment outcomes. I acknowledge that I will receive a copy of this consent concurrently upon execution via email to the email address I last provided but may also request a printed copy by calling the office of [facility].    I understand that my insurance will be billed for this visit.   I have read or had this consent read to me.  I understand the contents of this consent, which adequately explains the benefits and risks of the Services being provided via telemedicine.   I have been provided ample opportunity to ask questions regarding this consent and the Services and have had my questions  answered to my satisfaction.  I give my informed consent for the services to be provided through the use of telemedicine in my medical care  By participating in this telemedicine visit I agree to the above.

## 2018-10-21 NOTE — Telephone Encounter (Signed)
    COVID-19 Pre-Screening Questions:  . In the past 7 to 10 days have you had a cough,  shortness of breath, headache, congestion, fever (100 or greater) body aches, chills, sore throat, or sudden loss of taste or sense of smell? NO . Have you been around anyone with known Covid 19. NO . Have you been around anyone who is awaiting Covid 19 test results in the past 7 to 10 days? NO . Have you been around anyone who has been exposed to Covid 19, or has mentioned symptoms of Covid 19 within the past 7 to 10 days? NO  If you have any concerns/questions about symptoms patients report during screening (either on the phone or at threshold). Contact the provider seeing the patient or DOD for further guidance.  If neither are available contact a member of the leadership team.   Reviewed [facility] [place] clinic precautions/procedures with patient. She verbally agreed.

## 2018-10-21 NOTE — Telephone Encounter (Signed)
Pt c/o of Chest Pain: STAT if CP now or developed within 24 hours  1. Are you having CP right now? No   2. Are you experiencing any other symptoms (ex. SOB, nausea, vomiting, sweating)?  Under shoulder blade   3. How long have you been experiencing CP?  2-3 days   4. Is your CP continuous or coming and going? Comes  And goes   5. Have you taken Nitroglycerin? Yes Sunday helped for a while  ?

## 2018-10-23 NOTE — Telephone Encounter (Signed)
I spoke with the patient. She states she was doing well since she "started that medicine."  I apologized I did not realize she came in to see Christell Faith, PA on 10/21/18. She states that she has had no pain since starting imdur. She has a slight headache, but not bad enough to require taking anything.  I advised her that is she has no allergies to tylenol, that is ok for her to take if needed.  She is scheduled for a myoview on 10/28/18. I advised her to keep that appointment.  She voices understanding and is agreeable.

## 2018-10-28 ENCOUNTER — Ambulatory Visit
Admission: RE | Admit: 2018-10-28 | Discharge: 2018-10-28 | Disposition: A | Discharge status: Home / Self Care | Payer: Medicare HMO | Source: Ambulatory Visit | Attending: Physician Assistant | Admitting: Physician Assistant

## 2018-10-28 ENCOUNTER — Other Ambulatory Visit: Payer: Self-pay

## 2018-10-28 DIAGNOSIS — R079 Chest pain, unspecified: Secondary | ICD-10-CM | POA: Insufficient documentation

## 2018-10-28 LAB — NM MYOCAR MULTI W/SPECT W/WALL MOTION / EF
Estimated workload: 1 METS
Exercise duration (min): 0 min
Exercise duration (sec): 0 s
LV dias vol: 35 mL (ref 46–106)
LV sys vol: 10 mL
MPHR: 144 {beats}/min
Peak HR: 97 {beats}/min
Percent HR: 67 %
Rest HR: 55 {beats}/min
TID: 0.88

## 2018-10-28 MED ORDER — TECHNETIUM TC 99M TETROFOSMIN IV KIT
10.6430 | PACK | Freq: Once | INTRAVENOUS | Status: AC | PRN
  Administered 2018-10-28: 10.643 via INTRAVENOUS

## 2018-10-28 MED ORDER — TECHNETIUM TC 99M TETROFOSMIN IV KIT
30.0000 | PACK | Freq: Once | INTRAVENOUS | Status: AC | PRN
  Administered 2018-10-28: 30.64 via INTRAVENOUS

## 2018-10-28 MED ORDER — REGADENOSON 0.4 MG/5ML IV SOLN
0.4000 mg | Freq: Once | INTRAVENOUS | Status: AC
  Administered 2018-10-28: 0.4 mg via INTRAVENOUS
  Filled 2018-10-28: qty 5

## 2018-10-29 ENCOUNTER — Telehealth: Payer: Self-pay

## 2018-10-29 NOTE — Telephone Encounter (Signed)
Attempted to call pt re: her Stress Test and CT results... no voicemail as been set up. Will try again later this morning.

## 2018-10-29 NOTE — Telephone Encounter (Signed)
-----   Message from Rise Mu, PA-C sent at 10/29/2018  7:55 AM EDT ----- Stress test showed no evidence of ischemia with a hyperdynamic pump function. Overall this was a low risk study. She was noted to have coronary and aortic calcification on CT images. Aggressive risk factor modification is recommended. Recent LDL of 65 from 08/2018. Lastly, she was noted to have an asymmetric density noted in the right breast. Stress test images are unable to exclude malignancy. I recommend she schedule an appointment with her PCP to evaluate this further. Please fax this report/note to her PCP Paulita Cradle PA-C with [facility] as an FYI of the right breast density as well.

## 2018-10-29 NOTE — Telephone Encounter (Signed)
Pt advised and will call her PCP Dr. Carrie Mew in the morning.. will forward results.

## 2018-11-03 ENCOUNTER — Other Ambulatory Visit: Payer: Self-pay | Admitting: Physician Assistant

## 2018-11-03 DIAGNOSIS — R928 Other abnormal and inconclusive findings on diagnostic imaging of breast: Secondary | ICD-10-CM | POA: Diagnosis not present

## 2018-11-03 DIAGNOSIS — N6489 Other specified disorders of breast: Secondary | ICD-10-CM

## 2018-11-10 ENCOUNTER — Other Ambulatory Visit: Payer: Self-pay

## 2018-11-10 ENCOUNTER — Ambulatory Visit
Admission: RE | Admit: 2018-11-10 | Discharge: 2018-11-10 | Disposition: A | Discharge status: Home / Self Care | Payer: Medicare HMO | Source: Ambulatory Visit | Attending: Physician Assistant | Admitting: Physician Assistant

## 2018-11-10 DIAGNOSIS — R928 Other abnormal and inconclusive findings on diagnostic imaging of breast: Secondary | ICD-10-CM | POA: Insufficient documentation

## 2018-11-10 DIAGNOSIS — N6489 Other specified disorders of breast: Secondary | ICD-10-CM | POA: Diagnosis not present

## 2018-11-19 NOTE — Progress Notes (Signed)
Cardiology Office Note Date:  11/25/2018  Patient ID:  Cheryl Hall, Cheryl Hall 1940-06-03, MRN [id number] PCP:  Marinda Elk, MD  Cardiologist:  Dr. Fletcher Anon, MD    Chief Complaint: Follow-up  History of Present Illness: Cheryl Hall is a 78 y.o. female with history of nonobstructive CAD, CKD stage II, HTN, HLD, ongoing tobacco abuse, and GERD who presents for follow up of chest pain.  Patient was admitted in 09/2014 with a small NSTEMI in the setting of uncontrolled hypertension. LHC showed two-vessel CAD involving the LAD and RCA. EF was normal. She was readmitted in 09/2016 and underwent repeat LHC that showed stable nonobstructive CAD.  Cath details included left main 30% stenosis, proximal LAD 50% stenosis, mid LAD 20% stenosis, LCx angiographically normal, mid RCA 40% stenosis, distal RCA 50% stenosis.  Symptoms were again felt to be related to poorly controlled blood pressure. RAS in 02/2017 showed normal renal arteries. She was seen in 03/2017 with recurrent chest pain and underwent Myoview in 05/2017 that was negative for significant ischemia, EF 78%, low risk scan. She was seen in the office in 03/2018 and was doing well. BP was well controlled at 106/70.  She was most recently seen in the office on 10/21/2018 with a 2 to 3-day history of intermittent upper left-sided chest pain that would last 1 to 2 minutes with spontaneous resolution and occurring at rest.  Pain did not feel similar to prior hospital admissions leading to catheterizations as above.  She continued to smoke with tapering of use.  She underwent Lexiscan Myoview on 10/28/2018 that showed no significant ischemia or scar, LVEF greater than 65%, attenuation corrected CT images showed coronary artery calcification as well as aortic atherosclerosis.  Overall, this was a low risk study.  Incidentally, she was noted to have an asymmetric density noted in the right breast with subsequent mammogram on 11/10/2018 demonstrating no mammography  evidence of malignancy in either breast.  She comes in doing well today and has not had any further chest pain.  No shortness of breath, palpitations, dizziness, presyncope, or syncope.  No lower extremity swelling, abdominal distention, PND, orthopnea, or early satiety.  She does not routinely check her blood pressure at home.  She feels like the addition of Imdur 15 mg daily has helped.  She continues to taper her tobacco use.  No falls, BRBPR, or melena.  She does note some generalized fatigue though has attributed this to physical deconditioning.    Past Medical History:  Diagnosis Date   Anemia    Chronic back pain    CKD (chronic kidney disease), stage II    Coronary artery disease    a. 09/2014 NSTEMI/Cath: moderate 2-vessel disease;  b. 09/2016 NSTEMI/Cath: LM 30, LAD 50p, 72m, LCX nl, RCA 21m, 50d, EF 55-65%.   Diverticulitis    Essential hypertension    GERD (gastroesophageal reflux disease)    H/O Vertebral Fractures    History of echocardiogram    a. 09/2016 Echo: EF 55-60%, no rwma, mildly dil Ao root (3.7cm) and Asc Ao (3.9cm), mild to mod TR.   History of mania    History of sciatica    History of tobacco abuse    Hyperlipidemia    Peripheral neuropathy     Past Surgical History:  Procedure Laterality Date   CARDIAC CATHETERIZATION N/A 10/21/2014   Procedure: Left Heart Cath and Coronary Angiography;  Surgeon: Wellington Hampshire, MD;  Location: [facility];  Service: Cardiovascular;  Laterality: N/A;   CARDIAC CATHETERIZATION  10/21/2014   Dr. Fletcher Anon   CHOLECYSTECTOMY     LEFT HEART CATH AND CORONARY ANGIOGRAPHY N/A 10/09/2016   Procedure: Left Heart Cath and Coronary Angiography;  Surgeon: Nelva Bush, MD;  Location: [facility];  Service: Cardiovascular;  Laterality: N/A;    Current Meds  Medication Sig   acetaminophen (TYLENOL) 500 MG tablet Take 500 mg by mouth every 6 (six) hours as needed for mild pain or headache.     alendronate (FOSAMAX) 70 MG tablet TAKE 1 TABLET BY MOUTH ONCE A WEEK. TAKE WITH A FULL GLASS OF WATER. DO NOT LIE DOWN FOR THE NEXT 30 MINUTES   amLODipine (NORVASC) 5 MG tablet TAKE 1 TABLET BY MOUTH ONCE DAILY   aspirin EC 81 MG EC tablet Take 1 tablet (81 mg total) by mouth daily.   atorvastatin (LIPITOR) 40 MG tablet Take 1 tablet (40 mg total) by mouth daily at 6 PM.   carvedilol (COREG) 12.5 MG tablet Take 12.5 mg by mouth 2 (two) times daily with a meal.   Cholecalciferol (VITAMIN D-3 PO) Take 1,000 Int'l Units by mouth daily.    ferrous sulfate 325 (65 FE) MG tablet Take 325 mg by mouth daily.   hydrochlorothiazide (MICROZIDE) 12.5 MG capsule Take 12.5 mg by mouth daily.   isosorbide mononitrate (IMDUR) 30 MG 24 hr tablet Take 0.5 tablets (15 mg total) by mouth daily.   lisinopril (PRINIVIL,ZESTRIL) 40 MG tablet TAKE 1 TABLET BY MOUTH ONCE DAILY   nitroGLYCERIN (NITROSTAT) 0.4 MG SL tablet Place 0.4 mg under the tongue every 5 (five) minutes as needed for chest pain.   pantoprazole (PROTONIX) 40 MG tablet Take 1 tablet (40 mg total) by mouth daily.   traMADol-acetaminophen (ULTRACET) 37.5-325 MG tablet TAKE 1 2 (ONE HALF) TABLET BY MOUTH EVERY 6 HOURS AS NEEDED FOR PAIN   vitamin B-12 (CYANOCOBALAMIN) 1000 MCG tablet Take 1,000 mcg by mouth daily.    Allergies:   Patient has no known allergies.   Social History:  The patient  reports that she has been smoking cigarettes. She has a 12.50 pack-year smoking history. She has never used smokeless tobacco. She reports that she does not drink alcohol or use drugs.   Family History:  The patient's family history includes CAD in her brother; Cancer in her father; Other in her mother.  ROS:   Review of Systems  Constitutional: Positive for malaise/fatigue. Negative for chills, diaphoresis, fever and weight loss.  HENT: Negative for congestion.   Eyes: Negative for discharge and redness.  Respiratory: Negative for cough,  hemoptysis, sputum production, shortness of breath and wheezing.   Cardiovascular: Negative for chest pain, palpitations, orthopnea, claudication, leg swelling and PND.  Gastrointestinal: Negative for abdominal pain, blood in stool, heartburn, melena, nausea and vomiting.  Genitourinary: Negative for hematuria.  Musculoskeletal: Negative for falls and myalgias.  Skin: Negative for rash.  Neurological: Negative for dizziness, tingling, tremors, sensory change, speech change, focal weakness, loss of consciousness and weakness.  Endo/Heme/Allergies: Does not bruise/bleed easily.  Psychiatric/Behavioral: Negative for substance abuse. The patient is not nervous/anxious.   All other systems reviewed and are negative.    PHYSICAL EXAM:  VS:  BP 98/60 (BP Location: Left Arm, Patient Position: Sitting, Cuff Size: Normal)    Pulse (!) 58    Ht 5\' 2"  (1.575 m)    Wt 129 lb 12 oz (58.9 kg)    BMI 23.73 kg/m  BMI: Body mass index is  23.73 kg/m.  Physical Exam  Constitutional: She is oriented to person, place, and time. She appears well-developed and well-nourished.  HENT:  Head: Normocephalic and atraumatic.  Eyes: Right eye exhibits no discharge. Left eye exhibits no discharge.  Neck: Normal range of motion. No JVD present.  Cardiovascular: Normal rate, regular rhythm, S1 normal, S2 normal and normal heart sounds. Exam reveals no distant heart sounds, no friction rub, no midsystolic click and no opening snap.  No murmur heard. Pulses:      Posterior tibial pulses are 2+ on the right side and 2+ on the left side.  Pulmonary/Chest: Effort normal and breath sounds normal. No respiratory distress. She has no decreased breath sounds. She has no wheezes. She has no rales. She exhibits no tenderness.  Abdominal: Soft. She exhibits no distension. There is no abdominal tenderness.  Musculoskeletal:        General: No edema.  Neurological: She is alert and oriented to person, place, and time.  Skin: Skin  is warm and dry. No cyanosis. Nails show no clubbing.  Psychiatric: She has a normal mood and affect. Her speech is normal and behavior is normal. Judgment and thought content normal.     EKG:  Was ordered and interpreted by me today. Shows sinus bradycardia, 58 bpm, low voltage QRS, nonspecific st/t changes (no significant change when compared to prior)  Recent Labs: No results found for requested labs within last 8760 hours.  No results found for requested labs within last 8760 hours.   CrCl cannot be calculated (Patient's most recent lab result is older than the maximum 21 days allowed.).   Wt Readings from Last 3 Encounters:  11/25/18 129 lb 12 oz (58.9 kg)  10/21/18 130 lb 4 oz (59.1 kg)  04/01/18 129 lb 12 oz (58.9 kg)     Other studies reviewed: Additional studies/records reviewed today include: summarized above  ASSESSMENT AND PLAN:  1. CAD involving the native coronary arteries without angina: Chest pain-free.  Recent nuclear stress test low risk as outlined above.  Symptoms were somewhat atypical for ACS.  CT images did demonstrate coronary artery calcification as well as aortic atherosclerosis.  Aggressive risk factor modification is recommended.  Continue Imdur 15 mg daily, aspirin 81 mg daily, along with decreased dose of Coreg 6.25 mg twice daily in the setting of relative hypotension and bradycardic heart rate today.  2. HTN: Blood pressure and heart rate are on the soft side today though she is asymptomatic.  Decrease Coreg to 625 mg twice daily.  Continue current doses of Norvasc, HCTZ, Imdur, and lisinopril.  3. HLD: LDL of 65 from 08/2018.  Continue current dose of Lipitor.  4. Tobacco abuse: Complete cessation is recommended.  She indicates she continues to taper her use.  Disposition: F/u with Dr. Fletcher Anon or an APP in 6 months.  Current medicines are reviewed at length with the patient today.  The patient did not have any concerns regarding  medicines.  Signed, Christell Faith, PA-C 11/25/2018 9:21 AM     [facility] [address] [place] [address], [place] [address] [phone]

## 2018-11-21 ENCOUNTER — Telehealth: Payer: Self-pay | Admitting: *Deleted

## 2018-11-21 NOTE — Telephone Encounter (Signed)

## 2018-11-24 ENCOUNTER — Telehealth: Payer: Self-pay | Admitting: Cardiovascular Disease

## 2018-11-24 NOTE — Telephone Encounter (Signed)

## 2018-11-25 ENCOUNTER — Encounter: Payer: Self-pay | Admitting: Physician Assistant

## 2018-11-25 ENCOUNTER — Other Ambulatory Visit: Payer: Self-pay

## 2018-11-25 ENCOUNTER — Ambulatory Visit (INDEPENDENT_AMBULATORY_CARE_PROVIDER_SITE_OTHER): Payer: Medicare HMO | Admitting: Physician Assistant

## 2018-11-25 VITALS — BP 98/60 | HR 58 | Ht 62.0 in | Wt 129.8 lb

## 2018-11-25 DIAGNOSIS — I2510 Atherosclerotic heart disease of native coronary artery without angina pectoris: Secondary | ICD-10-CM

## 2018-11-25 DIAGNOSIS — E785 Hyperlipidemia, unspecified: Secondary | ICD-10-CM | POA: Diagnosis not present

## 2018-11-25 DIAGNOSIS — I10 Essential (primary) hypertension: Secondary | ICD-10-CM

## 2018-11-25 DIAGNOSIS — Z720 Tobacco use: Secondary | ICD-10-CM

## 2018-11-25 MED ORDER — CARVEDILOL 6.25 MG PO TABS: 6.2500 mg | ORAL_TABLET | Freq: Two times a day (BID) | ORAL | 3 refills | Status: DC

## 2018-11-25 NOTE — Patient Instructions (Signed)
Medication Instructions:  Your physician has recommended you make the following change in your medication:  1- DECREASE Coreg to 1 tablet (6.25 mg total) twice daily  If you need a refill on your cardiac medications before your next appointment, please call your pharmacy.   Lab work: None ordered  If you have labs (blood work) drawn today and your tests are completely normal, you will receive your results only by: Marland Kitchen MyChart Message (if you have MyChart) OR . A paper copy in the mail If you have any lab test that is abnormal or we need to change your treatment, we will call you to review the results.  Testing/Procedures: None ordered   Follow-Up: At [facility], you and your health needs are our priority.  As part of our continuing mission to provide you with exceptional heart care, we have created designated Provider Care Teams.  These Care Teams include your primary Cardiologist (physician) and Advanced Practice Providers (APPs -  Physician Assistants and Nurse Practitioners) who all work together to provide you with the care you need, when you need it. You will need a follow up appointment in 6 months.  Please call our office 2 months in advance to schedule this appointment.  You may see [name], MD or Christell Faith, PA-C.

## 2018-12-19 ENCOUNTER — Other Ambulatory Visit: Payer: Self-pay | Admitting: Cardiovascular Disease

## 2019-03-26 ENCOUNTER — Other Ambulatory Visit: Payer: Self-pay | Admitting: Cardiovascular Disease

## 2019-04-13 DIAGNOSIS — E782 Mixed hyperlipidemia: Secondary | ICD-10-CM | POA: Diagnosis not present

## 2019-04-13 DIAGNOSIS — R7303 Prediabetes: Secondary | ICD-10-CM | POA: Diagnosis not present

## 2019-04-13 DIAGNOSIS — I10 Essential (primary) hypertension: Secondary | ICD-10-CM | POA: Diagnosis not present

## 2019-04-16 DIAGNOSIS — E1122 Type 2 diabetes mellitus with diabetic chronic kidney disease: Secondary | ICD-10-CM | POA: Diagnosis not present

## 2019-04-16 DIAGNOSIS — K219 Gastro-esophageal reflux disease without esophagitis: Secondary | ICD-10-CM | POA: Diagnosis not present

## 2019-04-16 DIAGNOSIS — H9190 Unspecified hearing loss, unspecified ear: Secondary | ICD-10-CM | POA: Diagnosis not present

## 2019-04-16 DIAGNOSIS — F17210 Nicotine dependence, cigarettes, uncomplicated: Secondary | ICD-10-CM | POA: Diagnosis not present

## 2019-04-16 DIAGNOSIS — M810 Age-related osteoporosis without current pathological fracture: Secondary | ICD-10-CM | POA: Diagnosis not present

## 2019-04-16 DIAGNOSIS — E782 Mixed hyperlipidemia: Secondary | ICD-10-CM | POA: Diagnosis not present

## 2019-04-16 DIAGNOSIS — I129 Hypertensive chronic kidney disease with stage 1 through stage 4 chronic kidney disease, or unspecified chronic kidney disease: Secondary | ICD-10-CM | POA: Diagnosis not present

## 2019-04-16 DIAGNOSIS — Z23 Encounter for immunization: Secondary | ICD-10-CM | POA: Diagnosis not present

## 2019-04-16 DIAGNOSIS — I2510 Atherosclerotic heart disease of native coronary artery without angina pectoris: Secondary | ICD-10-CM | POA: Diagnosis not present

## 2019-04-16 DIAGNOSIS — N182 Chronic kidney disease, stage 2 (mild): Secondary | ICD-10-CM | POA: Diagnosis not present

## 2019-04-16 DIAGNOSIS — R0602 Shortness of breath: Secondary | ICD-10-CM | POA: Diagnosis not present

## 2019-04-24 ENCOUNTER — Other Ambulatory Visit: Payer: Self-pay

## 2019-04-24 ENCOUNTER — Emergency Department
Admission: EM | Admit: 2019-04-24 | Discharge: 2019-04-24 | Disposition: A | Discharge status: Home / Self Care | Payer: Medicare HMO | Attending: Emergency Medicine | Admitting: Emergency Medicine

## 2019-04-24 ENCOUNTER — Emergency Department: Payer: Medicare HMO

## 2019-04-24 DIAGNOSIS — N182 Chronic kidney disease, stage 2 (mild): Secondary | ICD-10-CM | POA: Insufficient documentation

## 2019-04-24 DIAGNOSIS — I252 Old myocardial infarction: Secondary | ICD-10-CM | POA: Diagnosis not present

## 2019-04-24 DIAGNOSIS — F17210 Nicotine dependence, cigarettes, uncomplicated: Secondary | ICD-10-CM | POA: Diagnosis not present

## 2019-04-24 DIAGNOSIS — R2681 Unsteadiness on feet: Secondary | ICD-10-CM | POA: Diagnosis not present

## 2019-04-24 DIAGNOSIS — N179 Acute kidney failure, unspecified: Secondary | ICD-10-CM | POA: Diagnosis not present

## 2019-04-24 DIAGNOSIS — Z7982 Long term (current) use of aspirin: Secondary | ICD-10-CM | POA: Diagnosis not present

## 2019-04-24 DIAGNOSIS — I2510 Atherosclerotic heart disease of native coronary artery without angina pectoris: Secondary | ICD-10-CM | POA: Insufficient documentation

## 2019-04-24 DIAGNOSIS — Z79899 Other long term (current) drug therapy: Secondary | ICD-10-CM | POA: Insufficient documentation

## 2019-04-24 DIAGNOSIS — I129 Hypertensive chronic kidney disease with stage 1 through stage 4 chronic kidney disease, or unspecified chronic kidney disease: Secondary | ICD-10-CM | POA: Insufficient documentation

## 2019-04-24 DIAGNOSIS — I10 Essential (primary) hypertension: Secondary | ICD-10-CM | POA: Diagnosis not present

## 2019-04-24 DIAGNOSIS — R519 Headache, unspecified: Secondary | ICD-10-CM | POA: Diagnosis not present

## 2019-04-24 DIAGNOSIS — I6523 Occlusion and stenosis of bilateral carotid arteries: Secondary | ICD-10-CM | POA: Diagnosis not present

## 2019-04-24 LAB — URINALYSIS, ROUTINE W REFLEX MICROSCOPIC
Bilirubin Urine: NEGATIVE
Glucose, UA: NEGATIVE mg/dL
Hgb urine dipstick: NEGATIVE
Ketones, ur: NEGATIVE mg/dL
Leukocytes,Ua: NEGATIVE
Nitrite: NEGATIVE
Protein, ur: NEGATIVE mg/dL
Specific Gravity, Urine: 1.02 (ref 1.005–1.030)
pH: 5 (ref 5.0–8.0)

## 2019-04-24 LAB — CBC WITH DIFFERENTIAL/PLATELET
Abs Immature Granulocytes: 0.04 10*3/uL (ref 0.00–0.07)
Basophils Absolute: 0 10*3/uL (ref 0.0–0.1)
Basophils Relative: 1 %
Eosinophils Absolute: 0.1 10*3/uL (ref 0.0–0.5)
Eosinophils Relative: 1 %
HCT: 35.8 % — ABNORMAL LOW (ref 36.0–46.0)
Hemoglobin: 11.9 g/dL — ABNORMAL LOW (ref 12.0–15.0)
Immature Granulocytes: 1 %
Lymphocytes Relative: 18 %
Lymphs Abs: 1.5 10*3/uL (ref 0.7–4.0)
MCH: 31.1 pg (ref 26.0–34.0)
MCHC: 33.2 g/dL (ref 30.0–36.0)
MCV: 93.5 fL (ref 80.0–100.0)
Monocytes Absolute: 0.5 10*3/uL (ref 0.1–1.0)
Monocytes Relative: 7 %
Neutro Abs: 6 10*3/uL (ref 1.7–7.7)
Neutrophils Relative %: 72 %
Platelets: 187 10*3/uL (ref 150–400)
RBC: 3.83 MIL/uL — ABNORMAL LOW (ref 3.87–5.11)
RDW: 14.5 % (ref 11.5–15.5)
WBC: 8.1 10*3/uL (ref 4.0–10.5)
nRBC: 0 % (ref 0.0–0.2)

## 2019-04-24 LAB — COMPREHENSIVE METABOLIC PANEL
ALT: 9 U/L (ref 0–44)
AST: 19 U/L (ref 15–41)
Albumin: 4 g/dL (ref 3.5–5.0)
Alkaline Phosphatase: 70 U/L (ref 38–126)
Anion gap: 12 (ref 5–15)
BUN: 25 mg/dL — ABNORMAL HIGH (ref 8–23)
CO2: 22 mmol/L (ref 22–32)
Calcium: 8.8 mg/dL — ABNORMAL LOW (ref 8.9–10.3)
Chloride: 106 mmol/L (ref 98–111)
Creatinine, Ser: 1.46 mg/dL — ABNORMAL HIGH (ref 0.44–1.00)
GFR calc Af Amer: 40 mL/min — ABNORMAL LOW (ref >60)
GFR calc non Af Amer: 35 mL/min — ABNORMAL LOW (ref >60)
Glucose, Bld: 97 mg/dL (ref 70–99)
Potassium: 3.8 mmol/L (ref 3.5–5.1)
Sodium: 140 mmol/L (ref 135–145)
Total Bilirubin: 0.5 mg/dL (ref 0.3–1.2)
Total Protein: 7 g/dL (ref 6.5–8.1)

## 2019-04-24 LAB — TROPONIN I (HIGH SENSITIVITY)
Troponin I (High Sensitivity): 5 ng/L (ref <18)
Troponin I (High Sensitivity): 6 ng/L (ref <18)

## 2019-04-24 MED ORDER — SODIUM CHLORIDE 0.9 % IV BOLUS
1000.0000 mL | Freq: Once | INTRAVENOUS | Status: AC
  Administered 2019-04-24: 1000 mL via INTRAVENOUS

## 2019-04-24 MED ORDER — IOHEXOL 350 MG/ML SOLN
75.0000 mL | Freq: Once | INTRAVENOUS | Status: AC | PRN
  Administered 2019-04-24: 60 mL via INTRAVENOUS

## 2019-04-24 NOTE — ED Provider Notes (Signed)
[facility] Emergency Department Provider Note  ____________________________________________   First MD Initiated Contact with Patient 04/24/19 [phone]     (approximate)  I have reviewed the triage vital signs and the nursing notes.   HISTORY  Chief Complaint Gait Problem    HPI Cheryl STARNER is a 78 y.o. female with CKD, anemia who presents with loss of balance.  Patient states she had 3 episodes today where she lost her balance.  She was walking and then felt like she got a little wobbly.  This is been happening intermittent, nothing seems to bring it on, nothing makes it better.  She is not actually fallen from this. She denies any vomiting, dizziness, shortness of breath, chest pain.  She denies having this previously.  She was just worried because she lives at home by herself so she wanted to be further evaluated.    Past Medical History:  Diagnosis Date   Anemia    Chronic back pain    CKD (chronic kidney disease), stage II    Coronary artery disease    a. 09/2014 NSTEMI/Cath: moderate 2-vessel disease;  b. 09/2016 NSTEMI/Cath: LM 30, LAD 50p, 58m, LCX nl, RCA 59m, 50d, EF 55-65%.   Diverticulitis    Essential hypertension    GERD (gastroesophageal reflux disease)    H/O Vertebral Fractures    History of echocardiogram    a. 09/2016 Echo: EF 55-60%, no rwma, mildly dil Ao root (3.7cm) and Asc Ao (3.9cm), mild to mod TR.   History of mania    History of sciatica    History of tobacco abuse    Hyperlipidemia    Peripheral neuropathy     Patient Active Problem List   Diagnosis Date Noted   Moderate tricuspid regurgitation 10/10/2016   Acute renal insufficiency 10/10/2016   Hyperglycemia 10/10/2016   Thrombocytopenia ([place]) 10/10/2016   CAD in native artery 10/09/2016   Aortic atherosclerosis ([place]) 09/18/2016   Colitis    Lower GI bleed    Acute colitis 08/18/2016   Coronary artery disease involving native coronary  artery without angina pectoris 11/15/2014   Hyperlipidemia    Essential hypertension    Tobacco abuse    CKD (chronic kidney disease), stage II    GERD (gastroesophageal reflux disease)    NSTEMI (non-ST elevated myocardial infarction) ([place]) 10/20/2014    Past Surgical History:  Procedure Laterality Date   CARDIAC CATHETERIZATION N/A 10/21/2014   Procedure: Left Heart Cath and Coronary Angiography;  Surgeon: Wellington Hampshire, MD;  Location: [place] [facility];  Service: Cardiovascular;  Laterality: N/A;   CARDIAC CATHETERIZATION  10/21/2014   Dr. Fletcher Anon   CHOLECYSTECTOMY     LEFT HEART CATH AND CORONARY ANGIOGRAPHY N/A 10/09/2016   Procedure: Left Heart Cath and Coronary Angiography;  Surgeon: Nelva Bush, MD;  Location: [place] [facility];  Service: Cardiovascular;  Laterality: N/A;    Prior to Admission medications   Medication Sig Start Date End Date Taking? Authorizing Provider  acetaminophen (TYLENOL) 500 MG tablet Take 500 mg by mouth every 6 (six) hours as needed for mild pain or headache.     [provider]  alendronate (FOSAMAX) 70 MG tablet TAKE 1 TABLET BY MOUTH ONCE A WEEK. TAKE WITH A FULL GLASS OF WATER. DO NOT LIE DOWN FOR THE NEXT 30 MINUTES 03/06/18   [provider]  amLODipine (NORVASC) 5 MG tablet Take 1 tablet by mouth once daily 03/26/19   Wellington Hampshire, MD  aspirin EC 81 MG EC tablet Take 1 tablet (81 mg total) by mouth daily. 10/11/16   Theodoro Grist, MD  atorvastatin (LIPITOR) 40 MG tablet Take 1 tablet (40 mg total) by mouth daily at 6 PM. 10/10/16   Theodoro Grist, MD  carvedilol (COREG) 6.25 MG tablet Take 1 tablet (6.25 mg total) by mouth 2 (two) times daily. 11/25/18   Rise Mu, PA-C  Cholecalciferol (VITAMIN D-3 PO) Take 1,000 Int'l Units by mouth daily.     [provider]  ferrous sulfate 325 (65 FE) MG tablet Take 325 mg by mouth daily.    [provider]  hydrochlorothiazide (MICROZIDE) 12.5 MG  capsule Take 12.5 mg by mouth daily.    [provider]  isosorbide mononitrate (IMDUR) 30 MG 24 hr tablet Take 0.5 tablets (15 mg total) by mouth daily. 10/21/18 01/19/19  Rise Mu, PA-C  lisinopril (ZESTRIL) 40 MG tablet Take 1 tablet by mouth once daily 12/19/18   Wellington Hampshire, MD  nitroGLYCERIN (NITROSTAT) 0.4 MG SL tablet Place 0.4 mg under the tongue every 5 (five) minutes as needed for chest pain.    [provider]  pantoprazole (PROTONIX) 40 MG tablet Take 1 tablet (40 mg total) by mouth daily. 04/18/18   Theora Gianotti, NP  traMADol-acetaminophen (ULTRACET) 37.5-325 MG tablet TAKE 1 2 (ONE HALF) TABLET BY MOUTH EVERY 6 HOURS AS NEEDED FOR PAIN 01/02/18   [provider]  vitamin B-12 (CYANOCOBALAMIN) 1000 MCG tablet Take 1,000 mcg by mouth daily.    [provider]    Allergies Patient has no known allergies.  Family History  Problem Relation Age of Onset   Other Mother        died @ 100.   Cancer Father        died of pancreatic cancer   CAD Brother        alive in late 31's w/ h/o stenting.   Breast cancer Neg Hx     Social History Social History   Tobacco Use   Smoking status: Current Some Day Smoker    Packs/day: 0.25    Years: 50.00    Pack years: 12.50    Types: Cigarettes   Smokeless tobacco: Never Used   Tobacco comment: Smoking 2 cigarettes every other day.   Substance Use Topics   Alcohol use: No    Alcohol/week: 0.0 standard drinks   Drug use: No      Review of Systems Constitutional: No fever/chills Eyes: No visual changes. ENT: No sore throat. Cardiovascular: Denies chest pain. Respiratory: Denies shortness of breath. Gastrointestinal: No abdominal pain.  No nausea, no vomiting.  No diarrhea.  No constipation. Genitourinary: Negative for dysuria. Musculoskeletal: Negative for back pain. Skin: Negative for rash. Neurological: Negative for headaches, focal weakness or numbness.   Difficulty with ambulating All other ROS negative ____________________________________________   PHYSICAL EXAM:  VITAL SIGNS: ED Triage Vitals  Enc Vitals Group     BP 04/24/19 0858 117/73     Pulse Rate 04/24/19 0858 65     Resp 04/24/19 0858 17     Temp 04/24/19 0858 98.6 F (37 C)     Temp Source 04/24/19 0858 Oral     SpO2 04/24/19 0858 96 %     Weight 04/24/19 0859 131 lb (59.4 kg)     Height 04/24/19 0859 5\' 3"  (1.6 m)     Head Circumference --      Peak Flow --  Pain Score 04/24/19 0859 0     Pain Loc --      Pain Edu? --      Excl. in [place]? --     Constitutional: Alert and oriented. Well appearing and in no acute distress.  Very hard of hearing and patient forgot her hearing aid. Eyes: Conjunctivae are normal. EOMI. Head: Atraumatic. Nose: No congestion/rhinnorhea. Mouth/Throat: Mucous membranes are moist.  TMs with cerumen bilaterally Neck: No stridor. Trachea Midline. FROM Cardiovascular: Normal rate, regular rhythm. Grossly normal heart sounds.  Good peripheral circulation. Respiratory: Normal respiratory effort.  No retractions. Lungs CTAB. Gastrointestinal: Soft and nontender. No distention. No abdominal bruits.  Musculoskeletal: No lower extremity tenderness nor edema.  No joint effusions. Neurologic: Cranial nerves II through XII are intact.  No pronator drift.  Equal strength in arms and legs.  Finger-to-nose intact.  Negative Romberg sign.  Patient is ambulating with a mild limp which is at baseline per patient. Skin:  Skin is warm, dry and intact. No rash noted. Psychiatric: Mood and affect are normal. Speech and behavior are normal. GU: Deferred   ____________________________________________   LABS (all labs ordered are listed, but only abnormal results are displayed)  Labs Reviewed  CBC WITH DIFFERENTIAL/PLATELET - Abnormal; Notable for the following components:      Result Value   RBC 3.83 (*)    Hemoglobin 11.9 (*)    HCT 35.8 (*)    All  other components within normal limits  COMPREHENSIVE METABOLIC PANEL - Abnormal; Notable for the following components:   BUN 25 (*)    Creatinine, Ser 1.46 (*)    Calcium 8.8 (*)    GFR calc non Af Amer 35 (*)    GFR calc Af Amer 40 (*)    All other components within normal limits  URINALYSIS, ROUTINE W REFLEX MICROSCOPIC - Abnormal; Notable for the following components:   Color, Urine STRAW (*)    APPearance CLEAR (*)    All other components within normal limits  TROPONIN I (HIGH SENSITIVITY)  TROPONIN I (HIGH SENSITIVITY)   ____________________________________________   ED ECG REPORT I, Vanessa Marion, the attending physician, personally viewed and interpreted this ECG.  EKG sinus rate of 62, no st elevation, no twi, normal intervals.  ____________________________________________  RADIOLOGY   Official radiology report(s): Ct Angio Head W Or Wo Contrast  Result Date: 04/24/2019 CLINICAL DATA:  Headache, loss of balance EXAM: CT ANGIOGRAPHY HEAD AND NECK TECHNIQUE: Multidetector CT imaging of the head and neck was performed using the standard protocol during bolus administration of intravenous contrast. Multiplanar CT image reconstructions and MIPs were obtained to evaluate the vascular anatomy. Carotid stenosis measurements (when applicable) are obtained utilizing NASCET criteria, using the distal internal carotid diameter as the denominator. CONTRAST:  68mL OMNIPAQUE IOHEXOL 350 MG/ML SOLN COMPARISON:  CT head 2015 FINDINGS: CT HEAD FINDINGS Brain: There is no acute intracranial hemorrhage, mass-effect, or edema. Gray-white differentiation is preserved. There is no extra-axial fluid collection. Ventricles and sulci are within normal limits in size and configuration. Patchy and confluent hypoattenuation in the supratentorial white matter is nonspecific but probably reflects moderate chronic microvascular ischemic changes. Vascular: No hyperdense vessel. There is atherosclerotic  calcification at the skull base. Skull: Calvarium is unremarkable. Sinuses/Orbits: No acute finding. Other: None. Review of the MIP images confirms the above findings CTA NECK FINDINGS Motion artifact is present. Aortic arch: Calcified plaque along the arch and great vessel origins, which are patent. Right carotid system: Visualized common carotid  is patent with mild multifocal calcified plaque. Internal and external carotid arteries are patent. There is calcified plaque at the bifurcation and along the proximal ICA causing minimal stenosis. Left carotid system: Visualized common carotid is patent with mild multifocal calcified plaque. Internal and external carotid arteries are patent. There is calcified plaque at the bifurcation and along the proximal ICA causing minimal stenosis. Vertebral arteries: Visualized portions are patent. Focal tortuosity of the left V2 segment with left anterolateral epidural encroachment at the C3 level. Skeleton: Multilevel degenerative changes of the cervical spine, greatest at C4-C5 and C5-C6. Other neck: No neck mass or adenopathy. Upper chest: No apical lung mass. Review of the MIP images confirms the above findings CTA HEAD FINDINGS Anterior circulation: Intracranial internal carotid arteries are patent with mild calcified plaque. Anterior and middle cerebral arteries are patent. Posterior circulation: Intracranial vertebral arteries are patent with mild calcified plaque. Basilar artery is patent. Posterior cerebral arteries are patent. Venous sinuses: As permitted by contrast timing, patent. Review of the MIP images confirms the above findings IMPRESSION: Motion degraded neck vascular imaging. No acute intracranial abnormality. No large vessel occlusion or hemodynamically significant stenosis. Electronically Signed   By: Macy Mis M.D.   On: 04/24/2019 12:24   Ct Angio Neck W And/or Wo Contrast  Result Date: 04/24/2019 CLINICAL DATA:  Headache, loss of balance EXAM: CT  ANGIOGRAPHY HEAD AND NECK TECHNIQUE: Multidetector CT imaging of the head and neck was performed using the standard protocol during bolus administration of intravenous contrast. Multiplanar CT image reconstructions and MIPs were obtained to evaluate the vascular anatomy. Carotid stenosis measurements (when applicable) are obtained utilizing NASCET criteria, using the distal internal carotid diameter as the denominator. CONTRAST:  34mL OMNIPAQUE IOHEXOL 350 MG/ML SOLN COMPARISON:  CT head 2015 FINDINGS: CT HEAD FINDINGS Brain: There is no acute intracranial hemorrhage, mass-effect, or edema. Gray-white differentiation is preserved. There is no extra-axial fluid collection. Ventricles and sulci are within normal limits in size and configuration. Patchy and confluent hypoattenuation in the supratentorial white matter is nonspecific but probably reflects moderate chronic microvascular ischemic changes. Vascular: No hyperdense vessel. There is atherosclerotic calcification at the skull base. Skull: Calvarium is unremarkable. Sinuses/Orbits: No acute finding. Other: None. Review of the MIP images confirms the above findings CTA NECK FINDINGS Motion artifact is present. Aortic arch: Calcified plaque along the arch and great vessel origins, which are patent. Right carotid system: Visualized common carotid is patent with mild multifocal calcified plaque. Internal and external carotid arteries are patent. There is calcified plaque at the bifurcation and along the proximal ICA causing minimal stenosis. Left carotid system: Visualized common carotid is patent with mild multifocal calcified plaque. Internal and external carotid arteries are patent. There is calcified plaque at the bifurcation and along the proximal ICA causing minimal stenosis. Vertebral arteries: Visualized portions are patent. Focal tortuosity of the left V2 segment with left anterolateral epidural encroachment at the C3 level. Skeleton: Multilevel  degenerative changes of the cervical spine, greatest at C4-C5 and C5-C6. Other neck: No neck mass or adenopathy. Upper chest: No apical lung mass. Review of the MIP images confirms the above findings CTA HEAD FINDINGS Anterior circulation: Intracranial internal carotid arteries are patent with mild calcified plaque. Anterior and middle cerebral arteries are patent. Posterior circulation: Intracranial vertebral arteries are patent with mild calcified plaque. Basilar artery is patent. Posterior cerebral arteries are patent. Venous sinuses: As permitted by contrast timing, patent. Review of the MIP images confirms the above findings IMPRESSION: Motion  degraded neck vascular imaging. No acute intracranial abnormality. No large vessel occlusion or hemodynamically significant stenosis. Electronically Signed   By: Macy Mis M.D.   On: 04/24/2019 12:24    ____________________________________________   PROCEDURES  Procedure(s) performed (including Critical Care):  Procedures   ____________________________________________   INITIAL IMPRESSION / ASSESSMENT AND PLAN / ED COURSE  SHONNON DYBDAHL was evaluated in Emergency Department on 04/24/2019 for the symptoms described in the history of present illness. She was evaluated in the context of the global COVID-19 pandemic, which necessitated consideration that the patient might be at risk for infection with the SARS-CoV-2 virus that causes COVID-19. Institutional protocols and algorithms that pertain to the evaluation of patients at risk for COVID-19 are in a state of rapid change based on information released by regulatory bodies including the CDC and federal and state organizations. These policies and algorithms were followed during the patient's care in the ED.    Patient is a well-appearing 78 year old who presents with 3 episodes of wobbliness that lasted for a few seconds.  She is not actually had any falls.  Patient denies any infectious symptoms.   Will get labs evaluate for electrolyte abnormalities, AKI, UTI.  I have a lot lower suspicion for posterior stroke given her normal neuro exam, no ataxia with walking, normal finger-to-nose, no nystagmus or vomiting but will get CT head CTA to further evaluate.  At this time do not think patient needs MRI given how well-appearing she is unless the CTA is abnormal.  Labs are reassuring with hemoglobin around baseline.  No white count elevation. Trop 5.  UA no UTI.   Kidney function slightly elevated at 1.46.  Given 1L fluid.   Reevaluated patient.  Patient states she is feeling much better and she is requesting to be discharged home at this time.  Patient's final troponin is still in process and patient is okay with waiting for it to come back.  Patient states that she will follow-up with her primary care doctor.  Again I discussed with patient that at this time of low suspicion for posterior stroke so we will hold off on MRI.  However encouraged patient to return to ER if she has worsening symptoms or any other concerns.  I discussed the provisional nature of ED diagnosis, the treatment so far, the ongoing plan of care, follow up appointments and return precautions with the patient and any family or support people present. They expressed understanding and agreed with the plan, discharged home.  ____________________________________________   FINAL CLINICAL IMPRESSION(S) / ED DIAGNOSES   Final diagnoses:  Unsteadiness  AKI (acute kidney injury) ([place])      MEDICATIONS GIVEN DURING THIS VISIT:  Medications  sodium chloride 0.9 % bolus 1,000 mL (1,000 mLs Intravenous New Bag/Given 04/24/19 1215)  iohexol (OMNIPAQUE) 350 MG/ML injection 75 mL (60 mLs Intravenous Contrast Given 04/24/19 1131)     ED Discharge Orders    None       Note:  This document was prepared using Dragon voice recognition software and may include unintentional dictation errors.   Vanessa [place], MD 04/24/19  1309

## 2019-04-24 NOTE — ED Notes (Signed)
Pt discharged home after verbalizing understanding of discharge instructions; nad noted. 

## 2019-04-24 NOTE — ED Triage Notes (Signed)
Pt states she stumbled/lost her balance 3 times today. Denies dizziness or vision changes. Pt is HOH. Pt is a/ox3 at present.

## 2019-04-24 NOTE — Discharge Instructions (Signed)
CT imaging was negative for intracranial process.  I have low suspicion this is secondary to stroke but if she has worsening symptoms suggestive of ataxia, vomiting, or any other concerns she may need to be reevaluated for MRI.  Her kidney function was slightly elevated at 1.46.  Have given a liter of fluid.  Patient follow-up with her primary care doctor for repeat testing.   IMPRESSION: Motion degraded neck vascular imaging.   No acute intracranial abnormality. No large vessel occlusion or hemodynamically significant stenosis.

## 2019-05-01 DIAGNOSIS — M898X6 Other specified disorders of bone, lower leg: Secondary | ICD-10-CM | POA: Diagnosis not present

## 2019-05-01 DIAGNOSIS — M79662 Pain in left lower leg: Secondary | ICD-10-CM | POA: Diagnosis not present

## 2019-05-01 DIAGNOSIS — R42 Dizziness and giddiness: Secondary | ICD-10-CM | POA: Diagnosis not present

## 2019-05-01 DIAGNOSIS — F17210 Nicotine dependence, cigarettes, uncomplicated: Secondary | ICD-10-CM | POA: Diagnosis not present

## 2019-05-01 DIAGNOSIS — E860 Dehydration: Secondary | ICD-10-CM | POA: Diagnosis not present

## 2019-05-07 DIAGNOSIS — R42 Dizziness and giddiness: Secondary | ICD-10-CM | POA: Diagnosis not present

## 2019-05-07 DIAGNOSIS — I6523 Occlusion and stenosis of bilateral carotid arteries: Secondary | ICD-10-CM | POA: Diagnosis not present

## 2019-06-12 ENCOUNTER — Encounter: Payer: Self-pay | Admitting: Cardiovascular Disease

## 2019-06-12 ENCOUNTER — Ambulatory Visit: Payer: Medicare HMO | Admitting: Cardiovascular Disease

## 2019-06-12 ENCOUNTER — Other Ambulatory Visit: Payer: Self-pay

## 2019-06-12 VITALS — BP 130/90 | HR 73 | Ht 63.0 in | Wt 130.5 lb

## 2019-06-12 DIAGNOSIS — I2510 Atherosclerotic heart disease of native coronary artery without angina pectoris: Secondary | ICD-10-CM

## 2019-06-12 DIAGNOSIS — Z720 Tobacco use: Secondary | ICD-10-CM | POA: Diagnosis not present

## 2019-06-12 DIAGNOSIS — E782 Mixed hyperlipidemia: Secondary | ICD-10-CM | POA: Diagnosis not present

## 2019-06-12 DIAGNOSIS — I10 Essential (primary) hypertension: Secondary | ICD-10-CM | POA: Diagnosis not present

## 2019-06-12 NOTE — Progress Notes (Signed)
Cardiology Office Note   Date:  06/12/2019   ID:  Cheryl Hall, DOB [birth year], MRN [id number]  PCP:  Marinda Elk, MD  Cardiologist:   [name], MD   Chief Complaint  Patient presents with  . office visit    6 month F/U; Meds verbally reviewed with patient.      History of Present Illness: Cheryl Hall is a 79 y.o. 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 She has known history of hypertension, GERD, hyperlipidemia, ongoing tobacco use and chronic kidney disease stage II .  She presented to [facility] in May of 2016 with a small non-ST elevation myocardial infarction in the setting of uncontrolled hypertension.  Cardiac cath showed mild to moderate two-vessel coronary artery disease involving the LAD and RCA. Ejection fraction was normal. Elevated troponin was felt to be due to high blood pressure.   She had another similar presentation in May of 2018 and underwent another cardiac catheterization which showed stable nonobstructive disease. Her blood pressure was elevated and this gradually improved with the addition of amlodipine and  Hydrochlorothiazide. Renal artery duplex in October 2018 showed normal renal arteries.  She had atypical chest pain last year and underwent a Lexiscan Myoview in June which showed no evidence of ischemia with normal ejection fraction.  She was started on small dose of Imdur.  Overall, she feels better with no recurrent chest pain.  She lost her hearing aid and she is very hard of hearing.  She continues to smoke and has difficulty quitting.  Past Medical History:  Diagnosis Date  . Anemia   . Chronic back pain   . CKD (chronic kidney disease), stage II   . Coronary artery disease    a. 09/2014 NSTEMI/Cath: moderate 2-vessel disease;  b. 09/2016 NSTEMI/Cath: LM 30, LAD 50p, 85m, LCX nl, RCA 18m, 50d, EF 55-65%.  . Diverticulitis   . Essential hypertension   . GERD  (gastroesophageal reflux disease)   . H/O Vertebral Fractures   . History of echocardiogram    a. 09/2016 Echo: EF 55-60%, no rwma, mildly dil Ao root (3.7cm) and Asc Ao (3.9cm), mild to mod TR.  Marland Kitchen History of mania   . History of sciatica   . History of tobacco abuse   . Hyperlipidemia   . Peripheral neuropathy     Past Surgical History:  Procedure Laterality Date  . CARDIAC CATHETERIZATION N/A 10/21/2014   Procedure: Left Heart Cath and Coronary Angiography;  Surgeon: Wellington Hampshire, MD;  Location: [facility];  Service: Cardiovascular;  Laterality: N/A;  . CARDIAC CATHETERIZATION  10/21/2014   Dr. Fletcher Anon  . CHOLECYSTECTOMY    . LEFT HEART CATH AND CORONARY ANGIOGRAPHY N/A 10/09/2016   Procedure: Left Heart Cath and Coronary Angiography;  Surgeon: Nelva Bush, MD;  Location: [facility];  Service: Cardiovascular;  Laterality: N/A;     Current Outpatient Medications  Medication Sig Dispense Refill  . acetaminophen (TYLENOL) 500 MG tablet Take 500 mg by mouth every 6 (six) hours as needed for mild pain or headache.     . alendronate (FOSAMAX) 70 MG tablet TAKE 1 TABLET BY MOUTH ONCE A WEEK. TAKE WITH A FULL GLASS OF WATER. DO NOT LIE DOWN FOR THE NEXT 30 MINUTES  11  . amLODipine (NORVASC) 5 MG tablet Take 1 tablet by mouth once daily 90 tablet 0  . aspirin EC 81 MG EC tablet Take  1 tablet (81 mg total) by mouth daily. 30 tablet 3  . atorvastatin (LIPITOR) 40 MG tablet Take 1 tablet (40 mg total) by mouth daily at 6 PM. 30 tablet 3  . carvedilol (COREG) 6.25 MG tablet Take 1 tablet (6.25 mg total) by mouth 2 (two) times daily. 180 tablet 3  . Cholecalciferol (VITAMIN D-3 PO) Take 1,000 Int'l Units by mouth daily.     . ferrous sulfate 325 (65 FE) MG tablet Take 325 mg by mouth daily.    . hydrochlorothiazide (MICROZIDE) 12.5 MG capsule Take 12.5 mg by mouth daily.    . isosorbide mononitrate (IMDUR) 30 MG 24 hr tablet Take 0.5 tablets (15 mg total) by mouth daily.  45 tablet 3  . lisinopril (ZESTRIL) 40 MG tablet Take 1 tablet by mouth once daily 90 tablet 2  . nitroGLYCERIN (NITROSTAT) 0.4 MG SL tablet Place 0.4 mg under the tongue every 5 (five) minutes as needed for chest pain.    . pantoprazole (PROTONIX) 40 MG tablet Take 1 tablet (40 mg total) by mouth daily. 30 tablet 5  . traMADol-acetaminophen (ULTRACET) 37.5-325 MG tablet TAKE 1 2 (ONE HALF) TABLET BY MOUTH EVERY 6 HOURS AS NEEDED FOR PAIN  0  . vitamin B-12 (CYANOCOBALAMIN) 1000 MCG tablet Take 1,000 mcg by mouth daily.     No current facility-administered medications for this visit.    Allergies:   Patient has no known allergies.    Social History:  The patient  reports that she has been smoking cigarettes. She has a 12.50 pack-year smoking history. She has never used smokeless tobacco. She reports that she does not drink alcohol or use drugs.   Family History:  The patient's family history includes CAD in her brother; Cancer in her father; Other in her mother.    ROS:  Please see the history of present illness.   Otherwise, review of systems are positive for none.   All other systems are reviewed and negative.    PHYSICAL EXAM: VS:  BP 130/90 (BP Location: Left Arm, Patient Position: Sitting, Cuff Size: Normal)   Pulse 73   Ht 5\' 3"  (1.6 m)   Wt 130 lb 8 oz (59.2 kg)   SpO2 92%   BMI 23.12 kg/m  , BMI Body mass index is 23.12 kg/m. GEN: Well nourished, well developed, in no acute distress  HEENT: normal  Neck: no JVD, carotid bruits, or masses Cardiac: RRR; no murmurs, rubs, or gallops,no edema  Respiratory:  clear to auscultation bilaterally, normal work of breathing GI: soft, nontender, nondistended, + BS MS: no deformity or atrophy  Skin: warm and dry, no rash Neuro:  Strength and sensation are intact Psych: euthymic mood, full affect   EKG:  EKG is ordered today. The ekg ordered today demonstrates normal sinus rhythm with no significant ST or T wave changes.  Recent  Labs: 04/24/2019: ALT 9; BUN 25; Creatinine, Ser 1.46; Hemoglobin 11.9; Platelets 187; Potassium 3.8; Sodium 140    Lipid Panel    Component Value Date/Time   CHOL 132 10/09/2016 0640   CHOL 149 01/31/2012 0454   TRIG 107 10/09/2016 0640   TRIG 111 01/31/2012 0454   HDL 48 10/09/2016 0640   HDL 58 01/31/2012 0454   CHOLHDL 2.8 10/09/2016 0640   VLDL 21 10/09/2016 0640   VLDL 22 01/31/2012 0454   LDLCALC 63 10/09/2016 0640   LDLCALC 69 01/31/2012 0454      Wt Readings from Last 3 Encounters:  06/12/19  130 lb 8 oz (59.2 kg)  04/24/19 131 lb (59.4 kg)  11/25/18 129 lb 12 oz (58.9 kg)        ASSESSMENT AND PLAN:  1.  Coronary artery disease involving native coronary arteries without angina: She is doing well overall with no anginal symptoms.  She reports improvement in symptoms since last year.  Continue aspirin indefinitely.  Continue medical therapy  2. Essential hypertension: Blood pressure is now under well controlled with current medications.  3. Hyperlipidemia:Continue treatment with atorvastatin.  I reviewed most recent lipid profile done in November which showed a triglyceride of 121, HDL of 59 and LDL of 74 which is close to target.  4. Tobacco use: I again advised her to quit smoking but she has not been able to quit.    Disposition:   FU with me in 6 months  Signed,  [name], MD  06/12/2019 9:43 AM    [place]

## 2019-06-12 NOTE — Patient Instructions (Signed)

## 2019-07-07 ENCOUNTER — Other Ambulatory Visit: Payer: Self-pay | Admitting: Cardiovascular Disease

## 2019-08-17 ENCOUNTER — Inpatient Hospital Stay
Admission: EM | Admit: 2019-08-17 | Discharge: 2019-08-18 | DRG: 282 | Disposition: A | Discharge status: Home / Self Care | Payer: Medicare HMO | Attending: Hospitalist | Admitting: Hospitalist

## 2019-08-17 ENCOUNTER — Inpatient Hospital Stay
Admit: 2019-08-17 | Discharge: 2019-08-17 | Disposition: A | Discharge status: Home / Self Care | Payer: Medicare HMO | Attending: Internal Medicine | Admitting: Internal Medicine

## 2019-08-17 ENCOUNTER — Other Ambulatory Visit: Payer: Self-pay

## 2019-08-17 ENCOUNTER — Encounter: Payer: Self-pay | Admitting: Internal Medicine

## 2019-08-17 ENCOUNTER — Encounter
Admission: EM | Disposition: A | Discharge status: Home / Self Care | Payer: Self-pay | Source: Home / Self Care | Attending: Internal Medicine

## 2019-08-17 ENCOUNTER — Emergency Department: Payer: Medicare HMO

## 2019-08-17 DIAGNOSIS — I252 Old myocardial infarction: Secondary | ICD-10-CM | POA: Diagnosis not present

## 2019-08-17 DIAGNOSIS — Z7982 Long term (current) use of aspirin: Secondary | ICD-10-CM

## 2019-08-17 DIAGNOSIS — I200 Unstable angina: Secondary | ICD-10-CM | POA: Diagnosis not present

## 2019-08-17 DIAGNOSIS — Z720 Tobacco use: Secondary | ICD-10-CM | POA: Diagnosis not present

## 2019-08-17 DIAGNOSIS — Z20822 Contact with and (suspected) exposure to covid-19: Secondary | ICD-10-CM | POA: Diagnosis not present

## 2019-08-17 DIAGNOSIS — R079 Chest pain, unspecified: Secondary | ICD-10-CM | POA: Diagnosis not present

## 2019-08-17 DIAGNOSIS — I10 Essential (primary) hypertension: Secondary | ICD-10-CM | POA: Diagnosis not present

## 2019-08-17 DIAGNOSIS — Z79891 Long term (current) use of opiate analgesic: Secondary | ICD-10-CM | POA: Diagnosis not present

## 2019-08-17 DIAGNOSIS — I2510 Atherosclerotic heart disease of native coronary artery without angina pectoris: Secondary | ICD-10-CM | POA: Diagnosis not present

## 2019-08-17 DIAGNOSIS — Z8249 Family history of ischemic heart disease and other diseases of the circulatory system: Secondary | ICD-10-CM

## 2019-08-17 DIAGNOSIS — M5430 Sciatica, unspecified side: Secondary | ICD-10-CM | POA: Diagnosis present

## 2019-08-17 DIAGNOSIS — N182 Chronic kidney disease, stage 2 (mild): Secondary | ICD-10-CM | POA: Diagnosis present

## 2019-08-17 DIAGNOSIS — I129 Hypertensive chronic kidney disease with stage 1 through stage 4 chronic kidney disease, or unspecified chronic kidney disease: Secondary | ICD-10-CM | POA: Diagnosis not present

## 2019-08-17 DIAGNOSIS — Z800 Family history of malignant neoplasm of digestive organs: Secondary | ICD-10-CM | POA: Diagnosis not present

## 2019-08-17 DIAGNOSIS — K219 Gastro-esophageal reflux disease without esophagitis: Secondary | ICD-10-CM | POA: Diagnosis present

## 2019-08-17 DIAGNOSIS — F17210 Nicotine dependence, cigarettes, uncomplicated: Secondary | ICD-10-CM | POA: Diagnosis present

## 2019-08-17 DIAGNOSIS — Z79899 Other long term (current) drug therapy: Secondary | ICD-10-CM

## 2019-08-17 DIAGNOSIS — E785 Hyperlipidemia, unspecified: Secondary | ICD-10-CM | POA: Diagnosis not present

## 2019-08-17 DIAGNOSIS — G629 Polyneuropathy, unspecified: Secondary | ICD-10-CM | POA: Diagnosis present

## 2019-08-17 DIAGNOSIS — I214 Non-ST elevation (NSTEMI) myocardial infarction: Principal | ICD-10-CM

## 2019-08-17 DIAGNOSIS — K5792 Diverticulitis of intestine, part unspecified, without perforation or abscess without bleeding: Secondary | ICD-10-CM | POA: Diagnosis not present

## 2019-08-17 DIAGNOSIS — G8929 Other chronic pain: Secondary | ICD-10-CM | POA: Diagnosis present

## 2019-08-17 DIAGNOSIS — R0989 Other specified symptoms and signs involving the circulatory and respiratory systems: Secondary | ICD-10-CM | POA: Diagnosis present

## 2019-08-17 HISTORY — PX: LEFT HEART CATH AND CORONARY ANGIOGRAPHY: CATH118249

## 2019-08-17 LAB — COMPREHENSIVE METABOLIC PANEL WITH GFR
ALT: 10 U/L (ref 0–44)
AST: 18 U/L (ref 15–41)
Albumin: 3.6 g/dL (ref 3.5–5.0)
Alkaline Phosphatase: 77 U/L (ref 38–126)
Anion gap: 6 (ref 5–15)
BUN: 21 mg/dL (ref 8–23)
CO2: 24 mmol/L (ref 22–32)
Calcium: 8.6 mg/dL — ABNORMAL LOW (ref 8.9–10.3)
Chloride: 112 mmol/L — ABNORMAL HIGH (ref 98–111)
Creatinine, Ser: 1.34 mg/dL — ABNORMAL HIGH (ref 0.44–1.00)
GFR calc Af Amer: 44 mL/min — ABNORMAL LOW
GFR calc non Af Amer: 38 mL/min — ABNORMAL LOW
Glucose, Bld: 98 mg/dL (ref 70–99)
Potassium: 4.4 mmol/L (ref 3.5–5.1)
Sodium: 142 mmol/L (ref 135–145)
Total Bilirubin: 0.4 mg/dL (ref 0.3–1.2)
Total Protein: 6.3 g/dL — ABNORMAL LOW (ref 6.5–8.1)

## 2019-08-17 LAB — CBC
HCT: 38.3 % (ref 36.0–46.0)
HCT: 35.1 % — ABNORMAL LOW (ref 36.0–46.0)
Hemoglobin: 12.1 g/dL (ref 12.0–15.0)
Hemoglobin: 11.2 g/dL — ABNORMAL LOW (ref 12.0–15.0)
MCH: 30.6 pg (ref 26.0–34.0)
MCH: 30.7 pg (ref 26.0–34.0)
MCHC: 31.6 g/dL (ref 30.0–36.0)
MCHC: 31.9 g/dL (ref 30.0–36.0)
MCV: 96.7 fL (ref 80.0–100.0)
MCV: 96.2 fL (ref 80.0–100.0)
Platelets: 154 10*3/uL (ref 150–400)
Platelets: 152 10*3/uL (ref 150–400)
RBC: 3.96 MIL/uL (ref 3.87–5.11)
RBC: 3.65 MIL/uL — ABNORMAL LOW (ref 3.87–5.11)
RDW: 14.8 % (ref 11.5–15.5)
RDW: 14.7 % (ref 11.5–15.5)
WBC: 7.2 10*3/uL (ref 4.0–10.5)
WBC: 6 10*3/uL (ref 4.0–10.5)
nRBC: 0 % (ref 0.0–0.2)
nRBC: 0 % (ref 0.0–0.2)

## 2019-08-17 LAB — TROPONIN I (HIGH SENSITIVITY)
Troponin I (High Sensitivity): 65 ng/L — ABNORMAL HIGH (ref <18)
Troponin I (High Sensitivity): 254 ng/L
Troponin I (High Sensitivity): 993 ng/L (ref <18)
Troponin I (High Sensitivity): 1574 ng/L (ref <18)

## 2019-08-17 LAB — PROTIME-INR
INR: 1 (ref 0.8–1.2)
Prothrombin Time: 12.6 seconds (ref 11.4–15.2)

## 2019-08-17 LAB — APTT: aPTT: 29 s (ref 24–36)

## 2019-08-17 LAB — CREATININE, SERUM
Creatinine, Ser: 1.09 mg/dL — ABNORMAL HIGH (ref 0.44–1.00)
GFR calc Af Amer: 57 mL/min — ABNORMAL LOW (ref >60)
GFR calc non Af Amer: 49 mL/min — ABNORMAL LOW (ref >60)

## 2019-08-17 LAB — SARS CORONAVIRUS 2 (TAT 6-24 HRS): SARS Coronavirus 2: NEGATIVE

## 2019-08-17 SURGERY — LEFT HEART CATH AND CORONARY ANGIOGRAPHY
Anesthesia: Moderate Sedation

## 2019-08-17 MED ORDER — SODIUM CHLORIDE 0.9 % IV SOLN: 250.0000 mL | INTRAVENOUS | Status: DC | PRN

## 2019-08-17 MED ORDER — MIDAZOLAM HCL 2 MG/2ML IJ SOLN
INTRAMUSCULAR | Status: DC | PRN
  Administered 2019-08-17: 1 mg via INTRAVENOUS

## 2019-08-17 MED ORDER — SODIUM CHLORIDE 0.9% FLUSH: 3.0000 mL | INTRAVENOUS | Status: DC | PRN

## 2019-08-17 MED ORDER — AMLODIPINE BESYLATE 5 MG PO TABS: 5.0000 mg | ORAL_TABLET | Freq: Every day | ORAL | Status: DC

## 2019-08-17 MED ORDER — ASPIRIN 81 MG PO TBEC: 81.0000 mg | DELAYED_RELEASE_TABLET | Freq: Every day | ORAL | Status: DC

## 2019-08-17 MED ORDER — ENOXAPARIN SODIUM 40 MG/0.4ML ~~LOC~~ SOLN
40.0000 mg | SUBCUTANEOUS | Status: DC
  Administered 2019-08-18: 40 mg via SUBCUTANEOUS
  Filled 2019-08-17: qty 0.4

## 2019-08-17 MED ORDER — VITAMIN B-12 1000 MCG PO TABS
1000.0000 ug | ORAL_TABLET | Freq: Every day | ORAL | Status: DC
  Administered 2019-08-17 – 2019-08-18 (×2): 1000 ug via ORAL
  Filled 2019-08-17 (×2): qty 1

## 2019-08-17 MED ORDER — SODIUM CHLORIDE 0.9% FLUSH: 3.0000 mL | Freq: Two times a day (BID) | INTRAVENOUS | Status: DC

## 2019-08-17 MED ORDER — VERAPAMIL HCL 2.5 MG/ML IV SOLN
INTRAVENOUS | Status: DC | PRN
  Administered 2019-08-17: 2.5 mg via INTRA_ARTERIAL

## 2019-08-17 MED ORDER — PANTOPRAZOLE SODIUM 40 MG PO TBEC
40.0000 mg | DELAYED_RELEASE_TABLET | Freq: Every day | ORAL | Status: DC
  Administered 2019-08-17 – 2019-08-18 (×2): 40 mg via ORAL
  Filled 2019-08-17 (×2): qty 1

## 2019-08-17 MED ORDER — FENTANYL CITRATE (PF) 100 MCG/2ML IJ SOLN
INTRAMUSCULAR | Status: AC
  Filled 2019-08-17: qty 2

## 2019-08-17 MED ORDER — ATORVASTATIN CALCIUM 20 MG PO TABS
40.0000 mg | ORAL_TABLET | Freq: Every day | ORAL | Status: DC
  Administered 2019-08-17: 40 mg via ORAL
  Filled 2019-08-17: qty 2

## 2019-08-17 MED ORDER — ACETAMINOPHEN 500 MG PO TABS: 500.0000 mg | ORAL_TABLET | Freq: Four times a day (QID) | ORAL | Status: DC | PRN

## 2019-08-17 MED ORDER — NITROGLYCERIN 0.4 MG SL SUBL: 0.4000 mg | SUBLINGUAL_TABLET | SUBLINGUAL | Status: DC | PRN

## 2019-08-17 MED ORDER — VERAPAMIL HCL 2.5 MG/ML IV SOLN
INTRAVENOUS | Status: AC
  Filled 2019-08-17: qty 2

## 2019-08-17 MED ORDER — ASPIRIN EC 81 MG PO TBEC
81.0000 mg | DELAYED_RELEASE_TABLET | Freq: Every day | ORAL | Status: DC
  Administered 2019-08-18: 81 mg via ORAL
  Filled 2019-08-17: qty 1

## 2019-08-17 MED ORDER — HEPARIN SODIUM (PORCINE) 1000 UNIT/ML IJ SOLN
INTRAMUSCULAR | Status: AC
  Filled 2019-08-17: qty 1

## 2019-08-17 MED ORDER — IOHEXOL 300 MG/ML  SOLN
INTRAMUSCULAR | Status: DC | PRN
  Administered 2019-08-17: 40 mL

## 2019-08-17 MED ORDER — SODIUM CHLORIDE 0.9 % IV SOLN: INTRAVENOUS | Status: AC

## 2019-08-17 MED ORDER — SODIUM CHLORIDE 0.9 % WEIGHT BASED INFUSION
3.0000 mL/kg/h | INTRAVENOUS | Status: DC
  Administered 2019-08-17: 3 mL/kg/h via INTRAVENOUS

## 2019-08-17 MED ORDER — CARVEDILOL 6.25 MG PO TABS
6.2500 mg | ORAL_TABLET | Freq: Two times a day (BID) | ORAL | Status: DC
  Administered 2019-08-17 – 2019-08-18 (×2): 6.25 mg via ORAL
  Filled 2019-08-17 (×4): qty 1

## 2019-08-17 MED ORDER — CLOPIDOGREL BISULFATE 75 MG PO TABS
ORAL_TABLET | ORAL | Status: AC
  Administered 2019-08-17: 300 mg via ORAL
  Filled 2019-08-17: qty 4

## 2019-08-17 MED ORDER — FENTANYL CITRATE (PF) 100 MCG/2ML IJ SOLN
INTRAMUSCULAR | Status: DC | PRN
  Administered 2019-08-17: 25 ug via INTRAVENOUS

## 2019-08-17 MED ORDER — VITAMIN D 25 MCG (1000 UNIT) PO TABS
1000.0000 [IU] | ORAL_TABLET | Freq: Every day | ORAL | Status: DC
  Administered 2019-08-17 – 2019-08-18 (×2): 1000 [IU] via ORAL
  Filled 2019-08-17 (×2): qty 1

## 2019-08-17 MED ORDER — HEPARIN (PORCINE) 25000 UT/250ML-% IV SOLN
850.0000 [IU]/h | INTRAVENOUS | Status: DC
  Administered 2019-08-17: 850 [IU]/h via INTRAVENOUS
  Filled 2019-08-17: qty 250

## 2019-08-17 MED ORDER — HEPARIN BOLUS VIA INFUSION
3000.0000 [IU] | Freq: Once | INTRAVENOUS | Status: AC
  Administered 2019-08-17: 3000 [IU] via INTRAVENOUS
  Filled 2019-08-17: qty 3000

## 2019-08-17 MED ORDER — MIDAZOLAM HCL 2 MG/2ML IJ SOLN
INTRAMUSCULAR | Status: AC
  Filled 2019-08-17: qty 2

## 2019-08-17 MED ORDER — HEPARIN (PORCINE) IN NACL 1000-0.9 UT/500ML-% IV SOLN
INTRAVENOUS | Status: DC | PRN
  Administered 2019-08-17: 500 mL

## 2019-08-17 MED ORDER — ACETAMINOPHEN 325 MG PO TABS
ORAL_TABLET | ORAL | Status: AC
  Filled 2019-08-17: qty 2

## 2019-08-17 MED ORDER — FERROUS SULFATE 325 (65 FE) MG PO TABS
325.0000 mg | ORAL_TABLET | Freq: Every day | ORAL | Status: DC
  Administered 2019-08-17 – 2019-08-18 (×2): 325 mg via ORAL
  Filled 2019-08-17 (×2): qty 1

## 2019-08-17 MED ORDER — HEPARIN (PORCINE) IN NACL 1000-0.9 UT/500ML-% IV SOLN
INTRAVENOUS | Status: AC
  Filled 2019-08-17: qty 1000

## 2019-08-17 MED ORDER — ISOSORBIDE MONONITRATE ER 30 MG PO TB24
15.0000 mg | ORAL_TABLET | Freq: Every day | ORAL | Status: DC
  Administered 2019-08-17 – 2019-08-18 (×2): 15 mg via ORAL
  Filled 2019-08-17 (×3): qty 1

## 2019-08-17 MED ORDER — ASPIRIN 81 MG PO CHEW: 324.0000 mg | CHEWABLE_TABLET | ORAL | Status: AC

## 2019-08-17 MED ORDER — SODIUM CHLORIDE 0.9 % WEIGHT BASED INFUSION: 1.0000 mL/kg/h | INTRAVENOUS | Status: DC

## 2019-08-17 MED ORDER — ONDANSETRON HCL 4 MG/2ML IJ SOLN: 4.0000 mg | Freq: Four times a day (QID) | INTRAMUSCULAR | Status: DC | PRN

## 2019-08-17 MED ORDER — ASPIRIN 300 MG RE SUPP: 300.0000 mg | RECTAL | Status: AC

## 2019-08-17 MED ORDER — HYDROCHLOROTHIAZIDE 25 MG PO TABS: 12.5000 mg | ORAL_TABLET | Freq: Every day | ORAL | Status: DC

## 2019-08-17 MED ORDER — ACETAMINOPHEN 325 MG PO TABS
650.0000 mg | ORAL_TABLET | ORAL | Status: DC | PRN
  Administered 2019-08-17: 650 mg via ORAL

## 2019-08-17 MED ORDER — ALENDRONATE SODIUM 70 MG PO TABS: 70.0000 mg | ORAL_TABLET | ORAL | Status: DC

## 2019-08-17 MED ORDER — LISINOPRIL 20 MG PO TABS
40.0000 mg | ORAL_TABLET | Freq: Every day | ORAL | Status: DC
  Administered 2019-08-17 – 2019-08-18 (×2): 40 mg via ORAL
  Filled 2019-08-17 (×2): qty 2

## 2019-08-17 MED ORDER — CLOPIDOGREL BISULFATE 75 MG PO TABS
75.0000 mg | ORAL_TABLET | Freq: Every day | ORAL | Status: DC
  Administered 2019-08-18: 75 mg via ORAL
  Filled 2019-08-17: qty 1

## 2019-08-17 MED ORDER — CLOPIDOGREL BISULFATE 75 MG PO TABS: 300.0000 mg | ORAL_TABLET | Freq: Once | ORAL | Status: AC

## 2019-08-17 MED ORDER — SODIUM CHLORIDE 0.9% FLUSH
3.0000 mL | Freq: Two times a day (BID) | INTRAVENOUS | Status: DC
  Administered 2019-08-17 (×2): 3 mL via INTRAVENOUS

## 2019-08-17 MED ORDER — ASPIRIN 81 MG PO CHEW
324.0000 mg | CHEWABLE_TABLET | Freq: Once | ORAL | Status: AC
  Administered 2019-08-17: 324 mg via ORAL
  Filled 2019-08-17: qty 4

## 2019-08-17 MED ORDER — HEPARIN SODIUM (PORCINE) 1000 UNIT/ML IJ SOLN
INTRAMUSCULAR | Status: DC | PRN
  Administered 2019-08-17: 3000 [IU] via INTRAVENOUS

## 2019-08-17 SURGICAL SUPPLY — 7 items
CATH INFINITI 5FR JK (CATHETERS) ×2 IMPLANT
DEVICE RAD TR BAND REGULAR (VASCULAR PRODUCTS) ×2 IMPLANT
GLIDESHEATH SLEND SS 6F .021 (SHEATH) ×2 IMPLANT
KIT MANI 3VAL PERCEP (MISCELLANEOUS) ×3 IMPLANT
PACK CARDIAC CATH (CUSTOM PROCEDURE TRAY) ×3 IMPLANT
WIRE HITORQ VERSACORE ST 145CM (WIRE) ×2 IMPLANT
WIRE ROSEN-J .035X260CM (WIRE) ×2 IMPLANT

## 2019-08-17 NOTE — ED Triage Notes (Signed)
Pt states mid chest pressure radiating to left arm at 0200 this am with nausea. Pt states she took a ntg at home. Pt states did not take asa this am. Pt states pain has improved.

## 2019-08-17 NOTE — Progress Notes (Signed)
Patient arrived on unit from Emergency Department. Breathing is even and unlabored. No distress noted. Denies CP or SOB. Heparin going at 850, cosigned with nurse on unit, Albina Billet, Therapist, sports. Assessment is unremarkable and vitals are stable. All safety measures in place, bed alarm on, will continue to monitor.   Patient has morning medications due. PA Dunn for cardiology contacted about giving medications. Both Dr. Francine Graven, and Dunn agreed to give patient morning meds with sips of water. Patient cannot remember if she took morning medications, Son called who stated that he thinks she did take coreg this AM, but is unsure. PA, Dunn stated to hold Coreg for now but give other morning medications. Completed. Will monitor closely.

## 2019-08-17 NOTE — H&P (Signed)
History and Physical    Cheryl Hall [id number] DOB: Oct 20, 1940 DOA: 08/17/2019  PCP: Marinda Elk, MD   Patient coming from: Home  I have personally briefly reviewed patient's old medical records in [place]  Chief Complaint: Chest pain  HPI: Cheryl Hall is a 79 y.o. female with medical history significant for CAD with moderate 2 vessel disease who presents to the ER for evaluation of chest discomfort which started at about 2am on the morning of her admission. Chest discomfort was mostly over the left anterior chest wall with radiation to her left shoulder and was associated with nausea but no vomiting. Patient stated that she took 1 tablet of sublingual nitroglycerin and aspirin 81 mg with improvement of her pain from an 8 to a 2/10. She is currently chest pain free. She denies having any shortness of breath, diaphoresis, dizziness or lightheadedness. She denies having any abdominal pain, urinary symptoms or changes in her bowel habits.  ED Course: 79 year old female with a history of CAD who presented to the ER for chest pain. Patient took 1 sublingual nitroglycerin with improvement of pain before arrival.  Patient received 324mg  of Aspirin in the ER.  Troponin elevated at 65. Patient was started on IV heparin.   Review of Systems: As per HPI otherwise 10 point review of systems negative.    Past Medical History:  Diagnosis Date  . Anemia   . Chronic back pain   . CKD (chronic kidney disease), stage II   . Coronary artery disease    a. 09/2014 NSTEMI/Cath: moderate 2-vessel disease;  b. 09/2016 NSTEMI/Cath: LM 30, LAD 50p, 76m, LCX nl, RCA 58m, 50d, EF 55-65%.  . Diverticulitis   . Essential hypertension   . GERD (gastroesophageal reflux disease)   . H/O Vertebral Fractures   . History of echocardiogram    a. 09/2016 Echo: EF 55-60%, no rwma, mildly dil Ao root (3.7cm) and Asc Ao (3.9cm), mild to mod TR.  Marland Kitchen History of mania   . History of sciatica   . History of  tobacco abuse   . Hyperlipidemia   . Peripheral neuropathy     Past Surgical History:  Procedure Laterality Date  . CARDIAC CATHETERIZATION N/A 10/21/2014   Procedure: Left Heart Cath and Coronary Angiography;  Surgeon: Wellington Hampshire, MD;  Location: [place];  Service: Cardiovascular;  Laterality: N/A;  . CARDIAC CATHETERIZATION  10/21/2014   Dr. Fletcher Anon  . CHOLECYSTECTOMY    . LEFT HEART CATH AND CORONARY ANGIOGRAPHY N/A 10/09/2016   Procedure: Left Heart Cath and Coronary Angiography;  Surgeon: Nelva Bush, MD;  Location: [facility];  Service: Cardiovascular;  Laterality: N/A;     reports that she has been smoking cigarettes. She has a 12.50 pack-year smoking history. She has never used smokeless tobacco. She reports that she does not drink alcohol or use drugs.  No Known Allergies  Family History  Problem Relation Age of Onset  . Other Mother        died @ 22.  . Cancer Father        died of pancreatic cancer  . CAD Brother        alive in late 35's w/ h/o stenting.  . Breast cancer Neg Hx      Prior to Admission medications   Medication Sig Start Date End Date Taking? Authorizing Provider  acetaminophen (TYLENOL) 500 MG tablet Take 500 mg by mouth every 6 (six) hours as needed  for mild pain or headache.     [provider]  alendronate (FOSAMAX) 70 MG tablet Take 70 mg by mouth once a week.  03/06/18   [provider]  amLODipine (NORVASC) 5 MG tablet Take 1 tablet by mouth once daily 07/07/19   Wellington Hampshire, MD  aspirin EC 81 MG EC tablet Take 1 tablet (81 mg total) by mouth daily. 10/11/16   Theodoro Grist, MD  atorvastatin (LIPITOR) 40 MG tablet Take 1 tablet (40 mg total) by mouth daily at 6 PM. 10/10/16   Theodoro Grist, MD  carvedilol (COREG) 6.25 MG tablet Take 1 tablet (6.25 mg total) by mouth 2 (two) times daily. 11/25/18   Rise Mu, PA-C  Cholecalciferol (VITAMIN D-3 PO) Take 1,000 Int'l Units by mouth daily.      [provider]  ferrous sulfate 325 (65 FE) MG tablet Take 325 mg by mouth daily.    [provider]  hydrochlorothiazide (HYDRODIURIL) 12.5 MG tablet Take 12.5 mg by mouth daily.     [provider]  isosorbide mononitrate (IMDUR) 30 MG 24 hr tablet Take 15 mg by mouth daily.    [provider]  lisinopril (ZESTRIL) 40 MG tablet Take 1 tablet by mouth once daily 12/19/18   Wellington Hampshire, MD  nitroGLYCERIN (NITROSTAT) 0.4 MG SL tablet Place 0.4 mg under the tongue every 5 (five) minutes as needed for chest pain.    [provider]  pantoprazole (PROTONIX) 40 MG tablet Take 1 tablet (40 mg total) by mouth daily. 04/18/18   Theora Gianotti, NP  traMADol-acetaminophen (ULTRACET) 37.5-325 MG tablet Take 0.5 tablets by mouth every 6 (six) hours as needed (pain).  01/02/18   [provider]  vitamin B-12 (CYANOCOBALAMIN) 1000 MCG tablet Take 1,000 mcg by mouth daily.    [provider]    Physical Exam: Vitals:   08/17/19 0454 08/17/19 0630 08/17/19 0700  BP: (!) 148/79 121/71 120/68  Pulse: 66 (!) 58 (!) 54  Resp: 16 12 10   Temp: 98.3 F (36.8 C)    TempSrc: Oral    SpO2: 96% 99% 94%  Weight: 59.4 kg    Height: 5\' 2"  (1.575 m)       Vitals:   08/17/19 0454 08/17/19 0630 08/17/19 0700  BP: (!) 148/79 121/71 120/68  Pulse: 66 (!) 58 (!) 54  Resp: 16 12 10   Temp: 98.3 F (36.8 C)    TempSrc: Oral    SpO2: 96% 99% 94%  Weight: 59.4 kg    Height: 5\' 2"  (1.575 m)      Constitutional: NAD, alert and oriented x 3 Eyes: PERRL, lids and conjunctivae normal ENMT: Mucous membranes are moist. Hearing impaired Neck: normal, supple, no masses, no thyromegaly Respiratory: clear to auscultation bilaterally, no wheezing, no crackles. Normal respiratory effort. No accessory muscle use. Cardiovascular: Regular rate and rhythm, no murmurs / rubs / gallops. No extremity edema. 2+ pedal pulses. No carotid bruits. Chest pain  is non reproducible Abdomen: no tenderness, no masses palpated. No hepatosplenomegaly. Bowel sounds positive.  Musculoskeletal: no clubbing / cyanosis. No joint deformity upper and lower extremities.  Skin: no rashes, lesions, ulcers.  Neurologic: No gross focal neurologic deficit. Psychiatric: Normal mood and affect.   Labs on Admission: I have personally reviewed following labs and imaging studies  CBC: Recent Labs  Lab 08/17/19 0502  WBC 7.2  HGB 12.1  HCT 38.3  MCV 96.7  PLT 123456   Basic Metabolic  Panel: Recent Labs  Lab 08/17/19 0502  NA 142  K 4.4  CL 112*  CO2 24  GLUCOSE 98  BUN 21  CREATININE 1.34*  CALCIUM 8.6*   GFR: Estimated Creatinine Clearance: 28.2 mL/min (A) (by C-G formula based on SCr of 1.34 mg/dL (H)). Liver Function Tests: Recent Labs  Lab 08/17/19 0502  AST 18  ALT 10  ALKPHOS 77  BILITOT 0.4  PROT 6.3*  ALBUMIN 3.6   No results for input(s): LIPASE, AMYLASE in the last 168 hours. No results for input(s): AMMONIA in the last 168 hours. Coagulation Profile: No results for input(s): INR, PROTIME in the last 168 hours. Cardiac Enzymes: No results for input(s): CKTOTAL, CKMB, CKMBINDEX, TROPONINI in the last 168 hours. BNP (last 3 results) No results for input(s): PROBNP in the last 8760 hours. HbA1C: No results for input(s): HGBA1C in the last 72 hours. CBG: No results for input(s): GLUCAP in the last 168 hours. Lipid Profile: No results for input(s): CHOL, HDL, LDLCALC, TRIG, CHOLHDL, LDLDIRECT in the last 72 hours. Thyroid Function Tests: No results for input(s): TSH, T4TOTAL, FREET4, T3FREE, THYROIDAB in the last 72 hours. Anemia Panel: No results for input(s): VITAMINB12, FOLATE, FERRITIN, TIBC, IRON, RETICCTPCT in the last 72 hours. Urine analysis:    Component Value Date/Time   COLORURINE STRAW (A) 04/24/2019 1213   APPEARANCEUR CLEAR (A) 04/24/2019 1213   APPEARANCEUR Clear 10/16/2013 1616   LABSPEC 1.020 04/24/2019 1213    LABSPEC 1.021 10/16/2013 1616   PHURINE 5.0 04/24/2019 1213   GLUCOSEU NEGATIVE 04/24/2019 1213   GLUCOSEU Negative 10/16/2013 1616   HGBUR NEGATIVE 04/24/2019 1213   BILIRUBINUR NEGATIVE 04/24/2019 1213   BILIRUBINUR Negative 10/16/2013 1616   KETONESUR NEGATIVE 04/24/2019 1213   PROTEINUR NEGATIVE 04/24/2019 1213   NITRITE NEGATIVE 04/24/2019 1213   LEUKOCYTESUR NEGATIVE 04/24/2019 1213   LEUKOCYTESUR Trace 10/16/2013 1616    Radiological Exams on Admission: DG Chest 2 View  Result Date: 08/17/2019 CLINICAL DATA:  Chest pain EXAM: CHEST - 2 VIEW COMPARISON:  March 12, 2017 FINDINGS: The heart size and mediastinal contours are within normal limits. Aortic knob calcifications are present. There is prominence of the central pulmonary vasculature. No acute osseous abnormality. Slight S-shaped scoliotic curvature of the lumbar spine is seen. Right upper quadrant surgical clips are present. IMPRESSION: Mild pulmonary vascular congestion. Electronically Signed   By: Prudencio Pair M.D.   On: 08/17/2019 05:46    EKG: Independently reviewed.  Sinus rhythm  Assessment/Plan Active Problems:   NSTEMI (non-ST elevated myocardial infarction) (HCC)   Essential hypertension   GERD (gastroesophageal reflux disease)   CAD in native artery    NSTEMI Patient with a known history of CAD (moderate 2 vessel disease) who presents for evaluation of chest pain associated with nausea. Pain improved with nitroglycerin and has completely resolved Patient bumped her troponin, initial troponin was 65 and bumped to 245 Obtain serial troponin levels Continue heparin drip, aspirin, nitrates, statins and beta blockers Consult cardiology    Hypertension Blood pressure is stable on Carvedilol, Lisinopril and nitrates   GERD Continue PPI   DVT prophylaxis: Heparin Code Status: Full Family Communication: Plan of care was discussed with patient in detail. She verbalizes understanding and agrees with  the plan Disposition Plan: Back to previous home environment Consults called: Cardiology    Leaner Morici MD [facility]     08/17/2019, 7:45 AM

## 2019-08-17 NOTE — Progress Notes (Signed)
*  PRELIMINARY RESULTS* Echocardiogram 2D Echocardiogram has been performed.  Cheryl Hall Airanna Partin 08/17/2019, 7:36 PM

## 2019-08-17 NOTE — Interval H&P Note (Signed)
Cath Lab Visit (complete for each Cath Lab visit)  Clinical Evaluation Leading to the Procedure:   ACS: Yes.    Non-ACS:  n/a    History and Physical Interval Note:  08/17/2019 3:35 PM  Cheryl Hall  has presented today for surgery, with the diagnosis of non ST segment myocardial infarction.  The various methods of treatment have been discussed with the patient and family. After consideration of risks, benefits and other options for treatment, the patient has consented to  Procedure(s): LEFT HEART CATH AND CORONARY ANGIOGRAPHY (N/A) as a surgical intervention.  The patient's history has been reviewed, patient examined, no change in status, stable for surgery.  I have reviewed the patient's chart and labs.  Questions were answered to the patient's satisfaction.     [name]

## 2019-08-17 NOTE — Progress Notes (Signed)
PHARMACIST - PHYSICIAN COMMUNICATION  CONCERNING: P&T Medication Policy Regarding Oral Bisphosphonates  RECOMMENDATION: Your order for alendronate (Fosamax), ibandronate (Boniva), or risedronate (Actonel) has been discontinued at this time.  If the patient's post-hospital medical condition warrants safe use of this class of drugs, please resume the pre-hospital regimen upon discharge.  DESCRIPTION:  Alendronate (Fosamax), ibandronate (Boniva), and risedronate (Actonel) can cause severe esophageal erosions in patients who are unable to remain upright at least 30 minutes after taking this medication.   Since brief interruptions in therapy are thought to have minimal impact on bone mineral density, the [facility] has established that bisphosphonate orders should be routinely discontinued during hospitalization.   To override this safety policy and permit administration of Boniva, Fosamax, or Actonel in the hospital, prescribers must write "DO NOT HOLD" in the comments section when placing the order for this class of medications.   Lu Duffel, PharmD, BCPS Clinical Pharmacist 08/17/2019 9:34 AM

## 2019-08-17 NOTE — ED Notes (Signed)
Helped pt to bathroom. 

## 2019-08-17 NOTE — ED Notes (Signed)
Pts son was contacted, was told that pt was placed in a room and that he could visit if he wanted to, son stated that he was going to head to work. Was informed that patient would be staying at least the day for evaluation per Dr Owens Shark.

## 2019-08-17 NOTE — Progress Notes (Signed)
Patient refused to wear non-skid socks, explained patient safety.

## 2019-08-17 NOTE — Consult Note (Signed)
Cardiology Consultation:   Patient ID: MENNIE MADEN; [id number]; [birth year]   Admit date: 08/17/2019 Date of Consult: 08/17/2019  Primary Care Provider: Marinda Elk, MD Primary Cardiologist: Fletcher Anon   Patient Profile:   Cheryl Hall is a 79 y.o. female with a hx of mild to moderate nonobstructive CAD, CKD stage II, HTN, HLD, ongoing tobacco use, and GERD who is being seen today for the evaluation of chest pain/elevated troponin at the request of Dr. Francine Graven.  History of Present Illness:   [name] presented to the hospital in 2016 with a small NSTEMI in the setting of uncontrolled HTN. Cardiac cath showed mild to moderate two-vessel CAD involving the LAD and RCA. EF was normal. Her elevated troponin peaked at 0.49, and was felt to be secondary to elevated BP. She had a similar presentation in 09/2016, with elevated BP and troponin peaking at 3.67, with repeat cardiac cath at that time which showing stable nonobstructive CAD. Renal artery ultrasound in 02/2017 showed normal renal arteries. She had atypical chest pain in 10/2018 and underwent Lexiscan MPI which showed no evidence of ischemia with a normal EF. She was started on low-dose Imdur with symptom improvement.   She woke this morning around 2 AM with substernal chest "ache" that radiated to her left shoulder and along her left lateral chest wall that lasted for ~ 2 hours. She is uncertain if the pain or her cat woke her up. Pain improved with SL NTG x 1. There was a 2-3 second history of associated nausea. Pain felt similar to her prior presentations. She reports BP readings mostly in the Q000111Q systolic at home. Given her symptoms, she called her son, who transported her to the ED.  Upon the patient's arrival to [facility] they were found to have BP ranging from the [id number] to 0000000 systolic, HR 123456 to 0000000 bpm, temp afebrile, oxygen saturation 96% on room air, weight 59.4 kg. EKG showed sinus rhythm without acute st/t changes as below, CXR  showed mild pulmonary vascular congestion. Labs showed an initial HS-Tn of 65 with a delta of 254, potassium 4.4, BUN/SCr 21/1.34, albumin 3.6, AST/ALT normal, and CBC unremarkable. COVID-19 pending. She has received ASA 324 mg x 1 and been placed on a heparin gtt. Currently, chest pain free.   Past Medical History:  Diagnosis Date  . Anemia   . Chronic back pain   . CKD (chronic kidney disease), stage II   . Coronary artery disease    a. 09/2014 NSTEMI/Cath: moderate 2-vessel disease;  b. 09/2016 NSTEMI/Cath: LM 30, LAD 50p, 45m, LCX nl, RCA 82m, 50d, EF 55-65%.  . Diverticulitis   . Essential hypertension   . GERD (gastroesophageal reflux disease)   . H/O Vertebral Fractures   . History of echocardiogram    a. 09/2016 Echo: EF 55-60%, no rwma, mildly dil Ao root (3.7cm) and Asc Ao (3.9cm), mild to mod TR.  Marland Kitchen History of mania   . History of sciatica   . History of tobacco abuse   . Hyperlipidemia   . Peripheral neuropathy     Past Surgical History:  Procedure Laterality Date  . CARDIAC CATHETERIZATION N/A 10/21/2014   Procedure: Left Heart Cath and Coronary Angiography;  Surgeon: Wellington Hampshire, MD;  Location: [facility];  Service: Cardiovascular;  Laterality: N/A;  . CARDIAC CATHETERIZATION  10/21/2014   Dr. Fletcher Anon  . CHOLECYSTECTOMY    . LEFT HEART CATH AND CORONARY ANGIOGRAPHY N/A 10/09/2016  Procedure: Left Heart Cath and Coronary Angiography;  Surgeon: Nelva Bush, MD;  Location: [facility];  Service: Cardiovascular;  Laterality: N/A;     Home Meds: Prior to Admission medications   Medication Sig Start Date End Date Taking? Authorizing Provider  acetaminophen (TYLENOL) 500 MG tablet Take 500 mg by mouth every 6 (six) hours as needed for mild pain or headache.    Yes [provider]  alendronate (FOSAMAX) 70 MG tablet Take 70 mg by mouth once a week.  03/06/18  Yes [provider]  amLODipine (NORVASC) 5 MG tablet Take 1 tablet by  mouth once daily Patient taking differently: Take 5 mg by mouth daily.  07/07/19  Yes Wellington Hampshire, MD  aspirin EC 81 MG EC tablet Take 1 tablet (81 mg total) by mouth daily. 10/11/16  Yes Theodoro Grist, MD  atorvastatin (LIPITOR) 40 MG tablet Take 1 tablet (40 mg total) by mouth daily at 6 PM. 10/10/16  Yes Theodoro Grist, MD  carvedilol (COREG) 6.25 MG tablet Take 1 tablet (6.25 mg total) by mouth 2 (two) times daily. 11/25/18  Yes Cordarrius Coad, Areta Haber, PA-C  Cholecalciferol (VITAMIN D-3 PO) Take 1,000 Units by mouth daily.    Yes [provider]  ferrous sulfate 325 (65 FE) MG tablet Take 325 mg by mouth daily.   Yes [provider]  hydrochlorothiazide (HYDRODIURIL) 12.5 MG tablet Take 12.5 mg by mouth daily.    Yes [provider]  isosorbide mononitrate (IMDUR) 30 MG 24 hr tablet Take 15 mg by mouth daily.   Yes [provider]  lisinopril (ZESTRIL) 40 MG tablet Take 1 tablet by mouth once daily Patient taking differently: Take 40 mg by mouth daily.  12/19/18  Yes Wellington Hampshire, MD  nitroGLYCERIN (NITROSTAT) 0.4 MG SL tablet Place 0.4 mg under the tongue every 5 (five) minutes as needed for chest pain.   Yes [provider]  pantoprazole (PROTONIX) 40 MG tablet Take 1 tablet (40 mg total) by mouth daily. 04/18/18  Yes Theora Gianotti, NP  traMADol-acetaminophen (ULTRACET) 37.5-325 MG tablet Take 0.5 tablets by mouth every 6 (six) hours as needed (pain).  01/02/18  Yes [provider]  vitamin B-12 (CYANOCOBALAMIN) 1000 MCG tablet Take 1,000 mcg by mouth daily.   Yes [provider]    Inpatient Medications: Scheduled Meds: . alendronate  70 mg Oral Weekly  . aspirin  324 mg Oral NOW   Or  . aspirin  300 mg Rectal NOW  . [START ON 08/18/2019] aspirin EC  81 mg Oral Daily  . atorvastatin  40 mg Oral q1800  . carvedilol  6.25 mg Oral BID  . cholecalciferol  1,000 Units Oral Daily  . ferrous sulfate  325 mg Oral Daily  .  heparin  3,000 Units Intravenous Once  . isosorbide mononitrate  15 mg Oral Daily  . lisinopril  40 mg Oral Daily  . pantoprazole  40 mg Oral Daily  . sodium chloride flush  3 mL Intravenous Q12H  . vitamin B-12  1,000 mcg Oral Daily   Continuous Infusions: . sodium chloride    . heparin 850 Units/hr (08/17/19 0721)   PRN Meds: sodium chloride, acetaminophen, nitroGLYCERIN, ondansetron (ZOFRAN) IV, sodium chloride flush  Allergies:  No Known Allergies  Social History:   Social History   Socioeconomic History  . Marital status: Divorced    Spouse name: Not on file  . Number of children: Not on file  .  Years of education: Not on file  . Highest education level: Not on file  Occupational History  . Not on file  Tobacco Use  . Smoking status: Current Some Day Smoker    Packs/day: 0.25    Years: 50.00    Pack years: 12.50    Types: Cigarettes  . Smokeless tobacco: Never Used  . Tobacco comment: Smoking 2 cigarettes every other day.   Substance and Sexual Activity  . Alcohol use: No    Alcohol/week: 0.0 standard drinks  . Drug use: No  . Sexual activity: Not on file  Other Topics Concern  . Not on file  Social History Narrative   Lives in [place] by herself.  Does not routinely exercise.  Son nearby.   Social Determinants of Health   Financial Resource Strain:   . Difficulty of Paying Living Expenses:   Food Insecurity:   . Worried About Charity fundraiser in the Last Year:   . Arboriculturist in the Last Year:   Transportation Needs:   . Film/video editor (Medical):   Marland Kitchen Lack of Transportation (Non-Medical):   Physical Activity:   . Days of Exercise per Week:   . Minutes of Exercise per Session:   Stress:   . Feeling of Stress :   Social Connections:   . Frequency of Communication with Friends and Family:   . Frequency of Social Gatherings with Friends and Family:   . Attends Religious Services:   . Active Member of Clubs or Organizations:   .  Attends Archivist Meetings:   Marland Kitchen Marital Status:   Intimate Partner Violence:   . Fear of Current or Ex-Partner:   . Emotionally Abused:   Marland Kitchen Physically Abused:   . Sexually Abused:      Family History:  Family History  Problem Relation Age of Onset  . Other Mother        died @ 64.  . Cancer Father        died of pancreatic cancer  . CAD Brother        alive in late 46's w/ h/o stenting.  . Breast cancer Neg Hx     ROS:  Review of Systems  Constitutional: Positive for malaise/fatigue. Negative for chills, diaphoresis, fever and weight loss.  HENT: Negative for congestion.   Eyes: Negative for discharge and redness.  Respiratory: Negative for cough, sputum production, shortness of breath and wheezing.   Cardiovascular: Positive for chest pain. Negative for palpitations, orthopnea, claudication, leg swelling and PND.  Gastrointestinal: Positive for nausea. Negative for abdominal pain, heartburn and vomiting.       Nausea for 2-3 seconds  Musculoskeletal: Negative for falls and myalgias.  Skin: Negative for rash.  Neurological: Positive for weakness. Negative for dizziness, tingling, tremors, sensory change, speech change, focal weakness and loss of consciousness.  Psychiatric/Behavioral: Negative for substance abuse. The patient is not nervous/anxious.   All other systems reviewed and are negative.     Physical Exam/Data:   Vitals:   08/17/19 0730 08/17/19 0800 08/17/19 0900 08/17/19 0924  BP: 124/68 124/67 120/64 138/73  Pulse: (!) 54 (!) 53 (!) 57 (!) 58  Resp: 12 13 19 18   Temp:    98.1 F (36.7 C)  TempSrc:    Oral  SpO2: 97% 96% 98% 94%  Weight:    59.8 kg  Height:    5\' 2"  (1.575 m)   No intake or output data in the 24 hours  ending 08/17/19 0933 Filed Weights   08/17/19 0454 08/17/19 0924  Weight: 59.4 kg 59.8 kg   Body mass index is 24.12 kg/m.   Physical Exam: General: Well developed, well nourished, in no acute distress. Head:  Normocephalic, atraumatic, sclera non-icteric, no xanthomas, nares without discharge.  Neck: Negative for carotid bruits. JVD not elevated. Lungs: Coarse breath sounds bilaterally. Breathing is unlabored. Heart: RRR with S1 S2. No murmurs, rubs, or gallops appreciated. Abdomen: Soft, non-tender, non-distended with normoactive bowel sounds. No hepatomegaly. No rebound/guarding. No obvious abdominal masses. Msk:  Strength and tone appear normal for age. Extremities: No clubbing or cyanosis. No edema. Distal pedal pulses are 2+ and equal bilaterally. Neuro: Alert and oriented X 3. No facial asymmetry. No focal deficit. Moves all extremities spontaneously. Psych:  Responds to questions appropriately with a normal affect.   EKG:  The EKG was personally reviewed and demonstrates: 3/22, 04:44 - NSR, 67 bpm, no acute st/t changes. 3/22, 06:05 - NSR, 61 bpm, no acute st/t changes Telemetry:  Telemetry was personally reviewed and demonstrates: SR  Weights: [employer]   08/17/19 0454 08/17/19 0924  Weight: 59.4 kg 59.8 kg    Relevant CV Studies:  Lexiscan MPI 10/2018:  Normal pharmacologic myocardial perfusion stress test without significant ischemia or scar.  The left ventricular ejection fraction is hyperdynamic (>65%).  This is a low risk study.  Attenuation correction CT shows coronary artery calcification as well as aortic atherosclerosis.  Asymmetric density noted in the right breast. Clincal and mammographic correlation recommended to exclude malignancy. _______________  2D Echo 09/2016: - Left ventricle: The cavity size was normal. Systolic function was  normal. The estimated ejection fraction was in the range of 55%  to 60%. Wall motion was normal; there were no regional wall  motion abnormalities. Left ventricular diastolic function  parameters were normal.  - Aortic valve: Trileaflet; normal thickness leaflets.  - Aortic root: The aortic root was mildly dilated, 3.7  cm  - Ascending aorta: The ascending aorta was mildly dilated,  estimated at 3.9 cm  - Mitral valve: There was mild regurgitation.  - Left atrium: The atrium was mildly dilated.  - Right ventricle: Systolic function was normal.  - Tricuspid valve: There was mild-moderate regurgitation.  - Pulmonary arteries: Systolic pressure was within the normal  range.  _______________  [facility] 09/2016: Conclusions: 1. Moderate, non-critical coronary artery disease involving the distal LMCA, LAD, and RCA. No culprit lesion is evident for patient's NSTEMI. 2. Normal left ventricular systolic function and filling pressure.  Recommendations: 1. Medical therapy, including dual antiplatelet therapy with aspirin and clopidogrel for up to 12 months, as tolerated. 2. Add isosorbide mononitrate for antianginal therapy and blood pressure control. 3. Aggressive secondary prevention, including high-intensity statin therapy. 4. Monitor overnight, given medical management of NSTEMI. 5. Follow-up as an outpatient with Dr. Fletcher Anon.  Laboratory Data:  Chemistry Recent Labs  Lab 08/17/19 0502  NA 142  K 4.4  CL 112*  CO2 24  GLUCOSE 98  BUN 21  CREATININE 1.34*  CALCIUM 8.6*  GFRNONAA 38*  GFRAA 44*  ANIONGAP 6    Recent Labs  Lab 08/17/19 0502  PROT 6.3*  ALBUMIN 3.6  AST 18  ALT 10  ALKPHOS 77  BILITOT 0.4   Hematology Recent Labs  Lab 08/17/19 0502  WBC 7.2  RBC 3.96  HGB 12.1  HCT 38.3  MCV 96.7  MCH 30.6  MCHC 31.6  RDW 14.8  PLT 154   Cardiac EnzymesNo  results for input(s): TROPONINI in the last 168 hours. No results for input(s): TROPIPOC in the last 168 hours.  BNPNo results for input(s): BNP, PROBNP in the last 168 hours.  DDimer No results for input(s): DDIMER in the last 168 hours.  Radiology/Studies:  DG Chest 2 View  Result Date: 08/17/2019 IMPRESSION: Mild pulmonary vascular congestion. Electronically Signed   By: Prudencio Pair M.D.   On: 08/17/2019 05:46     Assessment and Plan:   1. Mild to moderate nonobstructive CAD with elevated troponin: -She has a known history of nonobstructive CAD involving the LAD and RCA with stable anatomy by LHC in 2018 and nonischemic Lexiscan MPI in 10/2018 -Woke with chest "ache" around 2 AM this morning with 2-3 seconds of associated nausea. Chest pain lasted for ~ 2 hours and felt similar to her prior episodes. She denies any significantly elevated BPs at home. She is uncertain if the pain or her cat woke her (to eat) -Currently, chest pain free -Continue to cycle HS-Tn to peak -Continue heparin gtt  -ASA -Titrate Imdur to 30 mg daily -Lipitor -Coreg -Echo pending -Keep NPO for now until HS-Tn is trended this morning -If repeat troponin is not dynamically elevated, we can let her eat and await echo, however, if her repeat troponin is significantly elevated, we will plan to proceed with LHC later today (pending schedule) -Sips with meds are ok  2. HTN: -Blood pressure improving to the Q000111Q systolic -Continue medications as outlined above  3. HLD: -LDL of 74 from 03/2019 with goal LDL < 70 -FLP pending  -Lipitor   4. Tobacco use: -She has been unable to quit -Complete cessation is again recommended    For questions or updates, please contact [place] Please consult [email or web address] for contact info under Cardiology/STEMI.   Signed, Christell Faith, PA-C [place] Pager: [phone] 08/17/2019, 9:33 AM

## 2019-08-17 NOTE — ED Provider Notes (Signed)
[facility] Emergency Department Provider Note  ____________________________________________   First MD Initiated Contact with Patient 08/17/19 0559     (approximate)  I have reviewed the triage vital signs and the nursing notes.   HISTORY  Chief Complaint Chest Pain   HPI Cheryl Hall is a 79 y.o. female with below list of previous medical conditions including 2 previous MI presents to the emergency department secondary to acute onset of central chest pain that radiates to the left shoulder which began this morning at 2 AM.  Patient admits to associated nausea however no vomiting.  Patient states that she took 1 nitroglycerin at home and 81 mg tablet of aspirin with pain improvement at present current pain score 2 out of 10.  Patient denies any dyspnea no dizziness.        Past Medical History:  Diagnosis Date  . Anemia   . Chronic back pain   . CKD (chronic kidney disease), stage II   . Coronary artery disease    a. 09/2014 NSTEMI/Cath: moderate 2-vessel disease;  b. 09/2016 NSTEMI/Cath: LM 30, LAD 50p, 97m, LCX nl, RCA 80m, 50d, EF 55-65%.  . Diverticulitis   . Essential hypertension   . GERD (gastroesophageal reflux disease)   . H/O Vertebral Fractures   . History of echocardiogram    a. 09/2016 Echo: EF 55-60%, no rwma, mildly dil Ao root (3.7cm) and Asc Ao (3.9cm), mild to mod TR.  Marland Kitchen History of mania   . History of sciatica   . History of tobacco abuse   . Hyperlipidemia   . Peripheral neuropathy     Patient Active Problem List   Diagnosis Date Noted  . Moderate tricuspid regurgitation 10/10/2016  . Acute renal insufficiency 10/10/2016  . Hyperglycemia 10/10/2016  . Thrombocytopenia ([place]) 10/10/2016  . CAD in native artery 10/09/2016  . Aortic atherosclerosis ([place]) 09/18/2016  . Colitis   . Lower GI bleed   . Acute colitis 08/18/2016  . Coronary artery disease involving native coronary artery without angina pectoris 11/15/2014  .  Hyperlipidemia   . Essential hypertension   . Tobacco abuse   . CKD (chronic kidney disease), stage II   . GERD (gastroesophageal reflux disease)   . NSTEMI (non-ST elevated myocardial infarction) ([place]) 10/20/2014    Past Surgical History:  Procedure Laterality Date  . CARDIAC CATHETERIZATION N/A 10/21/2014   Procedure: Left Heart Cath and Coronary Angiography;  Surgeon: Wellington Hampshire, MD;  Location: [place] [facility];  Service: Cardiovascular;  Laterality: N/A;  . CARDIAC CATHETERIZATION  10/21/2014   Dr. Fletcher Anon  . CHOLECYSTECTOMY    . LEFT HEART CATH AND CORONARY ANGIOGRAPHY N/A 10/09/2016   Procedure: Left Heart Cath and Coronary Angiography;  Surgeon: Nelva Bush, MD;  Location: [place] [facility];  Service: Cardiovascular;  Laterality: N/A;    Prior to Admission medications   Medication Sig Start Date End Date Taking? Authorizing Provider  acetaminophen (TYLENOL) 500 MG tablet Take 500 mg by mouth every 6 (six) hours as needed for mild pain or headache.     [provider]  alendronate (FOSAMAX) 70 MG tablet Take 70 mg by mouth once a week.  03/06/18   [provider]  amLODipine (NORVASC) 5 MG tablet Take 1 tablet by mouth once daily 07/07/19   Wellington Hampshire, MD  aspirin EC 81 MG EC tablet Take 1 tablet (81 mg total) by mouth daily. 10/11/16   Theodoro Grist, MD  atorvastatin (LIPITOR) 40  MG tablet Take 1 tablet (40 mg total) by mouth daily at 6 PM. 10/10/16   Theodoro Grist, MD  carvedilol (COREG) 6.25 MG tablet Take 1 tablet (6.25 mg total) by mouth 2 (two) times daily. 11/25/18   Rise Mu, PA-C  Cholecalciferol (VITAMIN D-3 PO) Take 1,000 Int'l Units by mouth daily.     [provider]  ferrous sulfate 325 (65 FE) MG tablet Take 325 mg by mouth daily.    [provider]  hydrochlorothiazide (HYDRODIURIL) 12.5 MG tablet Take 12.5 mg by mouth daily.     [provider]  isosorbide mononitrate (IMDUR) 30 MG 24 hr tablet Take  0.5 tablets (15 mg total) by mouth daily. 10/21/18 06/12/19  Rise Mu, PA-C  lisinopril (ZESTRIL) 40 MG tablet Take 1 tablet by mouth once daily 12/19/18   Wellington Hampshire, MD  nitroGLYCERIN (NITROSTAT) 0.4 MG SL tablet Place 0.4 mg under the tongue every 5 (five) minutes as needed for chest pain.    [provider]  pantoprazole (PROTONIX) 40 MG tablet Take 1 tablet (40 mg total) by mouth daily. 04/18/18   Theora Gianotti, NP  traMADol-acetaminophen (ULTRACET) 37.5-325 MG tablet TAKE 1 2 (ONE HALF) TABLET BY MOUTH EVERY 6 HOURS AS NEEDED FOR PAIN 01/02/18   [provider]  vitamin B-12 (CYANOCOBALAMIN) 1000 MCG tablet Take 1,000 mcg by mouth daily.    [provider]    Allergies Patient has no known allergies.  Family History  Problem Relation Age of Onset  . Other Mother        died @ 22.  . Cancer Father        died of pancreatic cancer  . CAD Brother        alive in late 4's w/ h/o stenting.  . Breast cancer Neg Hx     Social History Social History   Tobacco Use  . Smoking status: Current Some Day Smoker    Packs/day: 0.25    Years: 50.00    Pack years: 12.50    Types: Cigarettes  . Smokeless tobacco: Never Used  . Tobacco comment: Smoking 2 cigarettes every other day.   Substance Use Topics  . Alcohol use: No    Alcohol/week: 0.0 standard drinks  . Drug use: No    Review of Systems Constitutional: No fever/chills Eyes: No visual changes. ENT: No sore throat. Cardiovascular: Positive for chest pain. Respiratory: Denies shortness of breath. Gastrointestinal: No abdominal pain.  Positive for nausea nausea, no vomiting.  No diarrhea.  No constipation. Genitourinary: Negative for dysuria. Musculoskeletal: Negative for neck pain.  Negative for back pain. Integumentary: Negative for rash. Neurological: Negative for headaches, focal weakness or numbness.  ____________________________________________   PHYSICAL EXAM:  VITAL  SIGNS: ED Triage Vitals [08/17/19 0454]  Enc Vitals Group     BP (!) 148/79     Pulse Rate 66     Resp 16     Temp 98.3 F (36.8 C)     Temp Source Oral     SpO2 96 %     Weight 59.4 kg (131 lb)     Height 1.575 m (5\' 2" )     Head Circumference      Peak Flow      Pain Score 4     Pain Loc      Pain Edu?      Excl. in [place]?     Constitutional: Alert and oriented.  Eyes: Conjunctivae are normal.  Mouth/Throat: Patient is wearing a mask. Neck: No stridor.  No meningeal signs.   Cardiovascular: Normal rate, regular rhythm. Good peripheral circulation. Grossly normal heart sounds. Respiratory: Normal respiratory effort.  No retractions. Gastrointestinal: Soft and nontender. No distention.  Musculoskeletal: No lower extremity tenderness nor edema. No gross deformities of extremities. Neurologic:  Normal speech and language. No gross focal neurologic deficits are appreciated.  Skin:  Skin is warm, dry and intact. Psychiatric: Mood and affect are normal. Speech and behavior are normal.  ____________________________________________   LABS (all labs ordered are listed, but only abnormal results are displayed)  Labs Reviewed  COMPREHENSIVE METABOLIC PANEL - Abnormal; Notable for the following components:      Result Value   Chloride 112 (*)    Creatinine, Ser 1.34 (*)    Calcium 8.6 (*)    Total Protein 6.3 (*)    GFR calc non Af Amer 38 (*)    GFR calc Af Amer 44 (*)    All other components within normal limits  TROPONIN I (HIGH SENSITIVITY) - Abnormal; Notable for the following components:   Troponin I (High Sensitivity) 65 (*)    All other components within normal limits  SARS CORONAVIRUS 2 (TAT 6-24 HRS)  CBC   ____________________________________________  EKG  ED ECG REPORT I, [place] N Myangel Summons, the attending physician, personally viewed and interpreted this ECG.   Date: 08/17/2019  EKG Time: 4:44 AM  Rate: 67  Rhythm: Sinus rhythm  Axis: Normal  Intervals:  Normal  ST&T Change: None  ____________________________________________  RADIOLOGY I, [place] Carman Auxier, personally viewed and evaluated these images (plain radiographs) as part of my medical decision making, as well as reviewing the written report by the radiologist.  ED MD interpretation: Mild pulmonary vascular congestion on chest x-ray per radiologist.  Official radiology report(s): DG Chest 2 View  Result Date: 08/17/2019 CLINICAL DATA:  Chest pain EXAM: CHEST - 2 VIEW COMPARISON:  March 12, 2017 FINDINGS: The heart size and mediastinal contours are within normal limits. Aortic knob calcifications are present. There is prominence of the central pulmonary vasculature. No acute osseous abnormality. Slight S-shaped scoliotic curvature of the lumbar spine is seen. Right upper quadrant surgical clips are present. IMPRESSION: Mild pulmonary vascular congestion. Electronically Signed   By: Prudencio Pair M.D.   On: 08/17/2019 05:46    ____________________________________________   PROCEDURES     .Critical Care Performed by: Gregor Hams, MD Authorized by: Gregor Hams, MD   Critical care provider statement:    Critical care time (minutes):  30   Critical care time was exclusive of:  Separately billable procedures and treating other patients (NSTEMI)   Critical care was time spent personally by me on the following activities:  Development of treatment plan with patient or surrogate, discussions with consultants, evaluation of patient's response to treatment, examination of patient, obtaining history from patient or surrogate, ordering and performing treatments and interventions, ordering and review of laboratory studies, ordering and review of radiographic studies, pulse oximetry, re-evaluation of patient's condition and review of old charts     ____________________________________________   INITIAL IMPRESSION / MDM / ASSESSMENT AND PLAN / ED COURSE  As part of my  medical decision making, I reviewed the following data within the electronic MEDICAL RECORD NUMBER   79 year old female presented with above-stated history and physical exam secondary to chest pain with differential diagnosis including but not limited to ACS, angina.  Patient took 1 sublingual nitroglycerin with improvement of pain  before arrival.  Patient will be given 3 and 24 mg of aspirin now.  Troponin elevated currently 65 most recent troponin on record 04/23/2025 today's finding is 65.  As such will initiate heparin.  Patient discussed with Dr. Damita Dunnings hospitalist for hospital admission further evaluation and management  ____________________________________________  FINAL CLINICAL IMPRESSION(S) / ED DIAGNOSES  Final diagnoses:  NSTEMI (non-ST elevated myocardial infarction) [facility])     MEDICATIONS GIVEN DURING THIS VISIT:  Medications - No data to display   ED Discharge Orders    None      *Please note:  Cheryl Hall was evaluated in Emergency Department on 08/17/2019 for the symptoms described in the history of present illness. She was evaluated in the context of the global COVID-19 pandemic, which necessitated consideration that the patient might be at risk for infection with the SARS-CoV-2 virus that causes COVID-19. Institutional protocols and algorithms that pertain to the evaluation of patients at risk for COVID-19 are in a state of rapid change based on information released by regulatory bodies including the CDC and federal and state organizations. These policies and algorithms were followed during the patient's care in the ED.  Some ED evaluations and interventions may be delayed as a result of limited staffing during the pandemic.*  Note:  This document was prepared using Dragon voice recognition software and may include unintentional dictation errors.   Gregor Hams, MD 08/17/19 [phone]

## 2019-08-17 NOTE — ED Notes (Signed)
This RN attempted to call report at this time. Pt has not been assigned to floor nurse. This RN will call back in 15 minutes to give report.

## 2019-08-17 NOTE — ED Notes (Signed)
Pt denies chest pain at this time.

## 2019-08-17 NOTE — Progress Notes (Signed)
Alerted PA Dunn that patient's Troponins jumped to the 900s. MD placed order for heart cath. No chest pain. Patient is asymptomatic. Consent obtained. Patient understands procedure. Patient has been NPO.

## 2019-08-17 NOTE — Progress Notes (Signed)
Patient left for cardiac catheterization at 1344. Consent signed, vitals stable, breathing even and unlabored. No distress noted.   Patient returned to floor at 1740. TR band already removed, right radial site is clean dry and intact. No pain, bleeding, or redness noted. Vitals stable. Breathing even and unlabored. All safety measures in place. Will continue to monitor.

## 2019-08-17 NOTE — H&P (View-Only) (Signed)
Cardiology Consultation:   Patient ID: Cheryl Hall; IV:[id number]; [birth year]   Admit date: 08/17/2019 Date of Consult: 08/17/2019  Primary Care Provider: Marinda Elk, MD Primary Cardiologist: Fletcher Anon   Patient Profile:   Cheryl Hall is a 79 y.o. female with a hx of mild to moderate nonobstructive CAD, CKD stage II, HTN, HLD, ongoing tobacco use, and GERD who is being seen today for the evaluation of chest pain/elevated troponin at the request of Dr. Francine Graven.  History of Present Illness:   Ms. Cheryl Hall presented to the hospital in 2016 with a small NSTEMI in the setting of uncontrolled HTN. Cardiac cath showed mild to moderate two-vessel CAD involving the LAD and RCA. EF was normal. Her elevated troponin peaked at 0.49, and was felt to be secondary to elevated BP. She had a similar presentation in 09/2016, with elevated BP and troponin peaking at 3.67, with repeat cardiac cath at that time which showing stable nonobstructive CAD. Renal artery ultrasound in 02/2017 showed normal renal arteries. She had atypical chest pain in 10/2018 and underwent Lexiscan MPI which showed no evidence of ischemia with a normal EF. She was started on low-dose Imdur with symptom improvement.   She woke this morning around 2 AM with substernal chest "ache" that radiated to her left shoulder and along her left lateral chest wall that lasted for ~ 2 hours. She is uncertain if the pain or her cat woke her up. Pain improved with SL NTG x 1. There was a 2-3 second history of associated nausea. Pain felt similar to her prior presentations. She reports BP readings mostly in the [id number] systolic at home. Given her symptoms, she called her son, who transported her to the ED.  Upon the patient's arrival to [facility] they were found to have BP ranging from the [id number] to 0000000 systolic, HR 123456 to 0000000 bpm, temp afebrile, oxygen saturation 96% on room air, weight 59.4 kg. EKG showed sinus rhythm without acute st/t changes as below, CXR  showed mild pulmonary vascular congestion. Labs showed an initial HS-Tn of 65 with a delta of 254, potassium 4.4, BUN/SCr 21/1.34, albumin 3.6, AST/ALT normal, and CBC unremarkable. COVID-19 pending. She has received ASA 324 mg x 1 and been placed on a heparin gtt. Currently, chest pain free.   Past Medical History:  Diagnosis Date  . Anemia   . Chronic back pain   . CKD (chronic kidney disease), stage II   . Coronary artery disease    a. 09/2014 NSTEMI/Cath: moderate 2-vessel disease;  b. 09/2016 NSTEMI/Cath: LM 30, LAD 50p, 41m, LCX nl, RCA 18m, 50d, EF 55-65%.  . Diverticulitis   . Essential hypertension   . GERD (gastroesophageal reflux disease)   . H/O Vertebral Fractures   . History of echocardiogram    a. 09/2016 Echo: EF 55-60%, no rwma, mildly dil Ao root (3.7cm) and Asc Ao (3.9cm), mild to mod TR.  Marland Kitchen History of mania   . History of sciatica   . History of tobacco abuse   . Hyperlipidemia   . Peripheral neuropathy     Past Surgical History:  Procedure Laterality Date  . CARDIAC CATHETERIZATION N/A 10/21/2014   Procedure: Left Heart Cath and Coronary Angiography;  Surgeon: Wellington Hampshire, MD;  Location: [facility];  Service: Cardiovascular;  Laterality: N/A;  . CARDIAC CATHETERIZATION  10/21/2014   Dr. Fletcher Anon  . CHOLECYSTECTOMY    . LEFT HEART CATH AND CORONARY ANGIOGRAPHY N/A 10/09/2016  Procedure: Left Heart Cath and Coronary Angiography;  Surgeon: Nelva Bush, MD;  Location: [place];  Service: Cardiovascular;  Laterality: N/A;     Home Meds: Prior to Admission medications   Medication Sig Start Date End Date Taking? Authorizing Provider  acetaminophen (TYLENOL) 500 MG tablet Take 500 mg by mouth every 6 (six) hours as needed for mild pain or headache.    Yes [provider]  alendronate (FOSAMAX) 70 MG tablet Take 70 mg by mouth once a week.  03/06/18  Yes [provider]  amLODipine (NORVASC) 5 MG tablet Take 1 tablet by  mouth once daily Patient taking differently: Take 5 mg by mouth daily.  07/07/19  Yes Wellington Hampshire, MD  aspirin EC 81 MG EC tablet Take 1 tablet (81 mg total) by mouth daily. 10/11/16  Yes Theodoro Grist, MD  atorvastatin (LIPITOR) 40 MG tablet Take 1 tablet (40 mg total) by mouth daily at 6 PM. 10/10/16  Yes Theodoro Grist, MD  carvedilol (COREG) 6.25 MG tablet Take 1 tablet (6.25 mg total) by mouth 2 (two) times daily. 11/25/18  Yes Jewelz Ricklefs, Areta Haber, PA-C  Cholecalciferol (VITAMIN D-3 PO) Take 1,000 Units by mouth daily.    Yes [provider]  ferrous sulfate 325 (65 FE) MG tablet Take 325 mg by mouth daily.   Yes [provider]  hydrochlorothiazide (HYDRODIURIL) 12.5 MG tablet Take 12.5 mg by mouth daily.    Yes [provider]  isosorbide mononitrate (IMDUR) 30 MG 24 hr tablet Take 15 mg by mouth daily.   Yes [provider]  lisinopril (ZESTRIL) 40 MG tablet Take 1 tablet by mouth once daily Patient taking differently: Take 40 mg by mouth daily.  12/19/18  Yes Wellington Hampshire, MD  nitroGLYCERIN (NITROSTAT) 0.4 MG SL tablet Place 0.4 mg under the tongue every 5 (five) minutes as needed for chest pain.   Yes [provider]  pantoprazole (PROTONIX) 40 MG tablet Take 1 tablet (40 mg total) by mouth daily. 04/18/18  Yes Theora Gianotti, NP  traMADol-acetaminophen (ULTRACET) 37.5-325 MG tablet Take 0.5 tablets by mouth every 6 (six) hours as needed (pain).  01/02/18  Yes [provider]  vitamin B-12 (CYANOCOBALAMIN) 1000 MCG tablet Take 1,000 mcg by mouth daily.   Yes [provider]    Inpatient Medications: Scheduled Meds: . alendronate  70 mg Oral Weekly  . aspirin  324 mg Oral NOW   Or  . aspirin  300 mg Rectal NOW  . [START ON 08/18/2019] aspirin EC  81 mg Oral Daily  . atorvastatin  40 mg Oral q1800  . carvedilol  6.25 mg Oral BID  . cholecalciferol  1,000 Units Oral Daily  . ferrous sulfate  325 mg Oral Daily  .  heparin  3,000 Units Intravenous Once  . isosorbide mononitrate  15 mg Oral Daily  . lisinopril  40 mg Oral Daily  . pantoprazole  40 mg Oral Daily  . sodium chloride flush  3 mL Intravenous Q12H  . vitamin B-12  1,000 mcg Oral Daily   Continuous Infusions: . sodium chloride    . heparin 850 Units/hr (08/17/19 0721)   PRN Meds: sodium chloride, acetaminophen, nitroGLYCERIN, ondansetron (ZOFRAN) IV, sodium chloride flush  Allergies:  No Known Allergies  Social History:   Social History   Socioeconomic History  . Marital status: Divorced    Spouse name: Not on file  . Number of children: Not on file  .  Years of education: Not on file  . Highest education level: Not on file  Occupational History  . Not on file  Tobacco Use  . Smoking status: Current Some Day Smoker    Packs/day: 0.25    Years: 50.00    Pack years: 12.50    Types: Cigarettes  . Smokeless tobacco: Never Used  . Tobacco comment: Smoking 2 cigarettes every other day.   Substance and Sexual Activity  . Alcohol use: No    Alcohol/week: 0.0 standard drinks  . Drug use: No  . Sexual activity: Not on file  Other Topics Concern  . Not on file  Social History Narrative   Lives in [place] by herself.  Does not routinely exercise.  Son nearby.   Social Determinants of Health   Financial Resource Strain:   . Difficulty of Paying Living Expenses:   Food Insecurity:   . Worried About Charity fundraiser in the Last Year:   . Arboriculturist in the Last Year:   Transportation Needs:   . Film/video editor (Medical):   Marland Kitchen Lack of Transportation (Non-Medical):   Physical Activity:   . Days of Exercise per Week:   . Minutes of Exercise per Session:   Stress:   . Feeling of Stress :   Social Connections:   . Frequency of Communication with Friends and Family:   . Frequency of Social Gatherings with Friends and Family:   . Attends Religious Services:   . Active Member of Clubs or Organizations:   .  Attends Archivist Meetings:   Marland Kitchen Marital Status:   Intimate Partner Violence:   . Fear of Current or Ex-Partner:   . Emotionally Abused:   Marland Kitchen Physically Abused:   . Sexually Abused:      Family History:  Family History  Problem Relation Age of Onset  . Other Mother        died @ 2.  . Cancer Father        died of pancreatic cancer  . CAD Brother        alive in late 35's w/ h/o stenting.  . Breast cancer Neg Hx     ROS:  Review of Systems  Constitutional: Positive for malaise/fatigue. Negative for chills, diaphoresis, fever and weight loss.  HENT: Negative for congestion.   Eyes: Negative for discharge and redness.  Respiratory: Negative for cough, sputum production, shortness of breath and wheezing.   Cardiovascular: Positive for chest pain. Negative for palpitations, orthopnea, claudication, leg swelling and PND.  Gastrointestinal: Positive for nausea. Negative for abdominal pain, heartburn and vomiting.       Nausea for 2-3 seconds  Musculoskeletal: Negative for falls and myalgias.  Skin: Negative for rash.  Neurological: Positive for weakness. Negative for dizziness, tingling, tremors, sensory change, speech change, focal weakness and loss of consciousness.  Psychiatric/Behavioral: Negative for substance abuse. The patient is not nervous/anxious.   All other systems reviewed and are negative.     Physical Exam/Data:   Vitals:   08/17/19 0730 08/17/19 0800 08/17/19 0900 08/17/19 0924  BP: 124/68 124/67 120/64 138/73  Pulse: (!) 54 (!) 53 (!) 57 (!) 58  Resp: 12 13 19 18   Temp:    98.1 F (36.7 C)  TempSrc:    Oral  SpO2: 97% 96% 98% 94%  Weight:    59.8 kg  Height:    5\' 2"  (1.575 m)   No intake or output data in the 24 hours  ending 08/17/19 0933 Filed Weights   08/17/19 0454 08/17/19 0924  Weight: 59.4 kg 59.8 kg   Body mass index is 24.12 kg/m.   Physical Exam: General: Well developed, well nourished, in no acute distress. Head:  Normocephalic, atraumatic, sclera non-icteric, no xanthomas, nares without discharge.  Neck: Negative for carotid bruits. JVD not elevated. Lungs: Coarse breath sounds bilaterally. Breathing is unlabored. Heart: RRR with S1 S2. No murmurs, rubs, or gallops appreciated. Abdomen: Soft, non-tender, non-distended with normoactive bowel sounds. No hepatomegaly. No rebound/guarding. No obvious abdominal masses. Msk:  Strength and tone appear normal for age. Extremities: No clubbing or cyanosis. No edema. Distal pedal pulses are 2+ and equal bilaterally. Neuro: Alert and oriented X 3. No facial asymmetry. No focal deficit. Moves all extremities spontaneously. Psych:  Responds to questions appropriately with a normal affect.   EKG:  The EKG was personally reviewed and demonstrates: 3/22, 04:44 - NSR, 67 bpm, no acute st/t changes. 3/22, 06:05 - NSR, 61 bpm, no acute st/t changes Telemetry:  Telemetry was personally reviewed and demonstrates: SR  Weights: [employer]   08/17/19 0454 08/17/19 0924  Weight: 59.4 kg 59.8 kg    Relevant CV Studies:  Lexiscan MPI 10/2018:  Normal pharmacologic myocardial perfusion stress test without significant ischemia or scar.  The left ventricular ejection fraction is hyperdynamic (>65%).  This is a low risk study.  Attenuation correction CT shows coronary artery calcification as well as aortic atherosclerosis.  Asymmetric density noted in the right breast. Clincal and mammographic correlation recommended to exclude malignancy. _______________  2D Echo 09/2016: - Left ventricle: The cavity size was normal. Systolic function was  normal. The estimated ejection fraction was in the range of 55%  to 60%. Wall motion was normal; there were no regional wall  motion abnormalities. Left ventricular diastolic function  parameters were normal.  - Aortic valve: Trileaflet; normal thickness leaflets.  - Aortic root: The aortic root was mildly dilated, 3.7  cm  - Ascending aorta: The ascending aorta was mildly dilated,  estimated at 3.9 cm  - Mitral valve: There was mild regurgitation.  - Left atrium: The atrium was mildly dilated.  - Right ventricle: Systolic function was normal.  - Tricuspid valve: There was mild-moderate regurgitation.  - Pulmonary arteries: Systolic pressure was within the normal  range.  _______________  [facility] 09/2016: Conclusions: 1. Moderate, non-critical coronary artery disease involving the distal LMCA, LAD, and RCA. No culprit lesion is evident for patient's NSTEMI. 2. Normal left ventricular systolic function and filling pressure.  Recommendations: 1. Medical therapy, including dual antiplatelet therapy with aspirin and clopidogrel for up to 12 months, as tolerated. 2. Add isosorbide mononitrate for antianginal therapy and blood pressure control. 3. Aggressive secondary prevention, including high-intensity statin therapy. 4. Monitor overnight, given medical management of NSTEMI. 5. Follow-up as an outpatient with Dr. Fletcher Anon.  Laboratory Data:  Chemistry Recent Labs  Lab 08/17/19 0502  NA 142  K 4.4  CL 112*  CO2 24  GLUCOSE 98  BUN 21  CREATININE 1.34*  CALCIUM 8.6*  GFRNONAA 38*  GFRAA 44*  ANIONGAP 6    Recent Labs  Lab 08/17/19 0502  PROT 6.3*  ALBUMIN 3.6  AST 18  ALT 10  ALKPHOS 77  BILITOT 0.4   Hematology Recent Labs  Lab 08/17/19 0502  WBC 7.2  RBC 3.96  HGB 12.1  HCT 38.3  MCV 96.7  MCH 30.6  MCHC 31.6  RDW 14.8  PLT 154   Cardiac EnzymesNo  results for input(s): TROPONINI in the last 168 hours. No results for input(s): TROPIPOC in the last 168 hours.  BNPNo results for input(s): BNP, PROBNP in the last 168 hours.  DDimer No results for input(s): DDIMER in the last 168 hours.  Radiology/Studies:  DG Chest 2 View  Result Date: 08/17/2019 IMPRESSION: Mild pulmonary vascular congestion. Electronically Signed   By: Prudencio Pair M.D.   On: 08/17/2019 05:46     Assessment and Plan:   1. Mild to moderate nonobstructive CAD with elevated troponin: -She has a known history of nonobstructive CAD involving the LAD and RCA with stable anatomy by LHC in 2018 and nonischemic Lexiscan MPI in 10/2018 -Woke with chest "ache" around 2 AM this morning with 2-3 seconds of associated nausea. Chest pain lasted for ~ 2 hours and felt similar to her prior episodes. She denies any significantly elevated BPs at home. She is uncertain if the pain or her cat woke her (to eat) -Currently, chest pain free -Continue to cycle HS-Tn to peak -Continue heparin gtt  -ASA -Titrate Imdur to 30 mg daily -Lipitor -Coreg -Echo pending -Keep NPO for now until HS-Tn is trended this morning -If repeat troponin is not dynamically elevated, we can let her eat and await echo, however, if her repeat troponin is significantly elevated, we will plan to proceed with LHC later today (pending schedule) -Sips with meds are ok  2. HTN: -Blood pressure improving to the Q000111Q systolic -Continue medications as outlined above  3. HLD: -LDL of 74 from 03/2019 with goal LDL < 70 -FLP pending  -Lipitor   4. Tobacco use: -She has been unable to quit -Complete cessation is again recommended    For questions or updates, please contact [place] Please consult [email or web address] for contact info under Cardiology/STEMI.   Signed, Christell Faith, PA-C [place] Pager: [phone] 08/17/2019, 9:33 AM

## 2019-08-17 NOTE — Consult Note (Signed)
ANTICOAGULATION CONSULT NOTE - Initial Consult  Pharmacy Consult for Heparin drip Indication: chest pain/ACS  No Known Allergies  Patient Measurements: Height: 5\' 2"  (157.5 cm) Weight: 131 lb (59.4 kg) IBW/kg (Calculated) : 50.1 Heparin Dosing Weight: 59.4kg  Vital Signs: Temp: 98.3 F (36.8 C) (03/22 0454) Temp Source: Oral (03/22 0454) BP: 124/68 (03/22 0730) Pulse Rate: 54 (03/22 0730)  Labs: Recent Labs    08/17/19 0502 08/17/19 0633  HGB 12.1  --   HCT 38.3  --   PLT 154  --   CREATININE 1.34*  --   TROPONINIHS 65* 254*    Estimated Creatinine Clearance: 28.2 mL/min (A) (by C-G formula based on SCr of 1.34 mg/dL (H)).   Medical History: Past Medical History:  Diagnosis Date  . Anemia   . Chronic back pain   . CKD (chronic kidney disease), stage II   . Coronary artery disease    a. 09/2014 NSTEMI/Cath: moderate 2-vessel disease;  b. 09/2016 NSTEMI/Cath: LM 30, LAD 50p, 44m, LCX nl, RCA 50m, 50d, EF 55-65%.  . Diverticulitis   . Essential hypertension   . GERD (gastroesophageal reflux disease)   . H/O Vertebral Fractures   . History of echocardiogram    a. 09/2016 Echo: EF 55-60%, no rwma, mildly dil Ao root (3.7cm) and Asc Ao (3.9cm), mild to mod TR.  Marland Kitchen History of mania   . History of sciatica   . History of tobacco abuse   . Hyperlipidemia   . Peripheral neuropathy     Medications:  No pta anticoagulant noted  Assessment: 79 y.o. female with 2 previous MI presents to the emergency department secondary to acute onset of central chest pain.  Pharmacy has been consulted to initiate and monitor heparin drip.   APTT and INR already ordered and collected  Goal of Therapy:  Heparin level 0.3-0.7 units/ml Monitor platelets by anticoagulation protocol: Yes   Plan:  Give 3000 units bolus x 1 (order entered after start of drip) Start heparin infusion at 850 units/hr (started prior to bolus by ED physiician) Check anti-Xa level in 8 hours and daily while  on heparin Continue to monitor H&H and platelets  Lu Duffel, PharmD, BCPS Clinical Pharmacist 08/17/2019 8:21 AM

## 2019-08-17 NOTE — Progress Notes (Signed)
    Repeat HS-Tn came back at 933. Will plan for LHC today. Await echo. Risks and benefits of cardiac catheterization have been discussed with the patient including risks of bleeding, bruising, infection, kidney damage, stroke, heart attack, urgent need for bypass, injury to a limb, and death. The patient understands these risks and is willing to proceed with the procedure. All questions have been answered and concerns listened to.

## 2019-08-18 ENCOUNTER — Encounter: Payer: Self-pay | Admitting: Cardiology

## 2019-08-18 LAB — BASIC METABOLIC PANEL
Anion gap: 7 (ref 5–15)
BUN: 22 mg/dL (ref 8–23)
CO2: 24 mmol/L (ref 22–32)
Calcium: 8.5 mg/dL — ABNORMAL LOW (ref 8.9–10.3)
Chloride: 110 mmol/L (ref 98–111)
Creatinine, Ser: 1.11 mg/dL — ABNORMAL HIGH (ref 0.44–1.00)
GFR calc Af Amer: 56 mL/min — ABNORMAL LOW (ref >60)
GFR calc non Af Amer: 48 mL/min — ABNORMAL LOW (ref >60)
Glucose, Bld: 81 mg/dL (ref 70–99)
Potassium: 4.1 mmol/L (ref 3.5–5.1)
Sodium: 141 mmol/L (ref 135–145)

## 2019-08-18 LAB — CBC
HCT: 34 % — ABNORMAL LOW (ref 36.0–46.0)
Hemoglobin: 11 g/dL — ABNORMAL LOW (ref 12.0–15.0)
MCH: 30.8 pg (ref 26.0–34.0)
MCHC: 32.4 g/dL (ref 30.0–36.0)
MCV: 95.2 fL (ref 80.0–100.0)
Platelets: 145 10*3/uL — ABNORMAL LOW (ref 150–400)
RBC: 3.57 MIL/uL — ABNORMAL LOW (ref 3.87–5.11)
RDW: 14.8 % (ref 11.5–15.5)
WBC: 4.8 10*3/uL (ref 4.0–10.5)
nRBC: 0 % (ref 0.0–0.2)

## 2019-08-18 LAB — ECHOCARDIOGRAM COMPLETE
Height: 62 in
Weight: 2109.36 oz

## 2019-08-18 LAB — LIPID PANEL
Cholesterol: 150 mg/dL (ref 0–200)
HDL: 51 mg/dL (ref >40)
LDL Cholesterol: 81 mg/dL (ref 0–99)
Total CHOL/HDL Ratio: 2.9 RATIO
Triglycerides: 91 mg/dL (ref <150)
VLDL: 18 mg/dL (ref 0–40)

## 2019-08-18 MED ORDER — ATORVASTATIN CALCIUM 80 MG PO TABS: 80.0000 mg | ORAL_TABLET | Freq: Every day | ORAL | 2 refills | Status: DC

## 2019-08-18 MED ORDER — CLOPIDOGREL BISULFATE 75 MG PO TABS: 75.0000 mg | ORAL_TABLET | Freq: Every day | ORAL | 11 refills | Status: DC

## 2019-08-18 MED ORDER — ATORVASTATIN CALCIUM 80 MG PO TABS: 80.0000 mg | ORAL_TABLET | Freq: Every day | ORAL | Status: DC

## 2019-08-18 NOTE — Discharge Summary (Signed)
Physician Discharge Summary   Cheryl Hall  female DOB: 1940/09/27  [id number]  PCP: Marinda Elk, MD  Admit date: 08/17/2019 Discharge date: 08/18/2019  Admitted From: home Disposition:  home CODE STATUS: Full code  Discharge Instructions    AMB Referral to Cardiac Rehabilitation - Phase II   Complete by: As directed    Diagnosis: NSTEMI   After initial evaluation and assessments completed: Virtual Based Care may be provided alone or in conjunction with Phase 2 Cardiac Rehab based on patient barriers.: Yes   Diet - low sodium heart healthy   Complete by: As directed    Discharge instructions   Complete by: As directed    Cardiology wants you to increase your Lipitor to 80 mg daily, and take Plavix 75 mg daily for at least a year.  Please follow up with cardiology as outpatient.   Dr. Enzo Bi - -   Increase activity slowly   Complete by: As directed        Hospital Course:  For full details, please see H&P, progress notes, consult notes and ancillary notes.  Briefly,  Cheryl Hall is a 79 y.o. female with medical history significant for CAD with moderate 2 vessel disease who presented to the ER for evaluation of chest discomfort which started at about 2am on the morning of her admission.   Chest discomfort was mostly over the left anterior chest wall with radiation to her left shoulder and was associated with nausea but no vomiting. Patient stated that she took 1 tablet of sublingual nitroglycerin and aspirin 81 mg with improvement of her pain from an 8 to a 2/10.   NSTEMI Hx of CAD Initial trop 65 and trended up to 1574.  Pt was started on heprin gtt, and taken for LHC which showed mild to moderate nonobstructive coronary artery disease. No significant change since most recent cardiac catheterization.  Cardiology recommended starting the patient on clopidogrel which should be continued for at least 1 year and preferably longer.  Pt continued on aspirin,  nitrates, statins and beta blockers.  Hypertension Continued home Carvedilol, Lisinopril, HCTZ, amlodipine and nitrates.  GERD Continued PPI   Discharge Diagnoses:  Active Problems:   NSTEMI (non-ST elevated myocardial infarction) [facility])   Essential hypertension   GERD (gastroesophageal reflux disease)   CAD in native artery    Discharge Instructions:  Allergies as of 08/18/2019   No Known Allergies     Medication List    TAKE these medications   acetaminophen 500 MG tablet Commonly known as: TYLENOL Take 500 mg by mouth every 6 (six) hours as needed for mild pain or headache.   alendronate 70 MG tablet Commonly known as: FOSAMAX Take 70 mg by mouth once a week.   amLODipine 5 MG tablet Commonly known as: NORVASC Take 1 tablet by mouth once daily   aspirin 81 MG EC tablet Take 1 tablet (81 mg total) by mouth daily.   atorvastatin 80 MG tablet Commonly known as: LIPITOR Take 1 tablet (80 mg total) by mouth daily at 6 PM. What changed:   medication strength  how much to take   carvedilol 6.25 MG tablet Commonly known as: COREG Take 1 tablet (6.25 mg total) by mouth 2 (two) times daily.   clopidogrel 75 MG tablet Commonly known as: PLAVIX Take 1 tablet (75 mg total) by mouth daily with breakfast. Start taking on: August 19, 2019   ferrous sulfate 325 (65 FE) MG tablet Take 325  mg by mouth daily.   hydrochlorothiazide 12.5 MG tablet Commonly known as: HYDRODIURIL Take 12.5 mg by mouth daily.   isosorbide mononitrate 30 MG 24 hr tablet Commonly known as: IMDUR Take 15 mg by mouth daily.   lisinopril 40 MG tablet Commonly known as: ZESTRIL Take 1 tablet by mouth once daily   nitroGLYCERIN 0.4 MG SL tablet Commonly known as: NITROSTAT Place 0.4 mg under the tongue every 5 (five) minutes as needed for chest pain.   pantoprazole 40 MG tablet Commonly known as: PROTONIX Take 1 tablet (40 mg total) by mouth daily.   traMADol-acetaminophen  37.5-325 MG tablet Commonly known as: ULTRACET Take 0.5 tablets by mouth every 6 (six) hours as needed (pain).   vitamin B-12 1000 MCG tablet Commonly known as: CYANOCOBALAMIN Take 1,000 mcg by mouth daily.   VITAMIN D-3 PO Take 1,000 Units by mouth daily.       Follow-up Information    Marinda Elk, MD. Schedule an appointment as soon as possible for a visit in 1 week(s).   Specialty: Physician Assistant Contact information: [place] [address] [phone]        Wellington Hampshire, MD .   Specialty: Cardiology Contact information: [place] [address] [phone]           No Known Allergies   The results of significant diagnostics from this hospitalization (including imaging, microbiology, ancillary and laboratory) are listed below for reference.   Consultations:   Procedures/Studies: DG Chest 2 View  Result Date: 08/17/2019 CLINICAL DATA:  Chest pain EXAM: CHEST - 2 VIEW COMPARISON:  March 12, 2017 FINDINGS: The heart size and mediastinal contours are within normal limits. Aortic knob calcifications are present. There is prominence of the central pulmonary vasculature. No acute osseous abnormality. Slight S-shaped scoliotic curvature of the lumbar spine is seen. Right upper quadrant surgical clips are present. IMPRESSION: Mild pulmonary vascular congestion. Electronically Signed   By: Prudencio Pair M.D.   On: 08/17/2019 05:46   CARDIAC CATHETERIZATION  Result Date: 08/17/2019  LM lesion is 30% stenosed.  Mid RCA lesion is 50% stenosed.  Dist RCA lesion is 40% stenosed.  The left ventricular systolic function is normal.  LV end diastolic pressure is mildly elevated.  The left ventricular ejection fraction is 55-65% by visual estimate.  Mid LAD-1 lesion is 40% stenosed.  Mid LAD-2 lesion is 50% stenosed.  Mid LM to Dist LM lesion is 30% stenosed.  1.  Moderately  calcified coronary arteries with mild to moderate nonobstructive coronary artery disease.  No significant change since most recent cardiac catheterization. 2.  Normal LV systolic function and mildly elevated left ventricular end-diastolic pressure. 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  No indication for revascularization.  Continue aggressive medical therapy.  I am going to start the patient on clopidogrel which should be continued for at least 1 year and preferably longer given her recurrent presentation.  I discontinued heparin. I referred to cardiac rehab. Smoking cessation is strongly advised. Possible discharge home tomorrow.      Labs: BNP (last 3 results) No results for input(s): BNP in the last 8760 hours. Basic Metabolic Panel: Recent Labs  Lab 08/17/19 0502 08/17/19 1755 08/18/19 0428  NA 142  --  141  K 4.4  --  4.1  CL 112*  --  110  CO2 24  --  24  GLUCOSE 98  --  81  BUN 21  --  22  CREATININE 1.34* 1.09* 1.11*  CALCIUM 8.6*  --  8.5*   Liver Function Tests: Recent Labs  Lab 08/17/19 0502  AST 18  ALT 10  ALKPHOS 77  BILITOT 0.4  PROT 6.3*  ALBUMIN 3.6   No results for input(s): LIPASE, AMYLASE in the last 168 hours. No results for input(s): AMMONIA in the last 168 hours. CBC: Recent Labs  Lab 08/17/19 0502 08/17/19 1755 08/18/19 0428  WBC 7.2 6.0 4.8  HGB 12.1 11.2* 11.0*  HCT 38.3 35.1* 34.0*  MCV 96.7 96.2 95.2  PLT 154 152 145*   Cardiac Enzymes: No results for input(s): CKTOTAL, CKMB, CKMBINDEX, TROPONINI in the last 168 hours. BNP: Invalid input(s): POCBNP CBG: No results for input(s): GLUCAP in the last 168 hours. D-Dimer No results for input(s): DDIMER in the last 72 hours. Hgb A1c No results for input(s): HGBA1C in the last 72 hours. Lipid Profile Recent Labs    08/18/19 0428  CHOL 150  HDL 51  LDLCALC 81  TRIG 91  CHOLHDL 2.9   Thyroid function  studies No results for input(s): TSH, T4TOTAL, T3FREE, THYROIDAB in the last 72 hours.  Invalid input(s): FREET3 Anemia work up No results for input(s): VITAMINB12, FOLATE, FERRITIN, TIBC, IRON, RETICCTPCT in the last 72 hours. Urinalysis    Component Value Date/Time   COLORURINE STRAW (A) 04/24/2019 1213   APPEARANCEUR CLEAR (A) 04/24/2019 1213   APPEARANCEUR Clear 10/16/2013 1616   LABSPEC 1.020 04/24/2019 1213   LABSPEC 1.021 10/16/2013 1616   PHURINE 5.0 04/24/2019 1213   GLUCOSEU NEGATIVE 04/24/2019 1213   GLUCOSEU Negative 10/16/2013 1616   HGBUR NEGATIVE 04/24/2019 1213   BILIRUBINUR NEGATIVE 04/24/2019 1213   BILIRUBINUR Negative 10/16/2013 1616   KETONESUR NEGATIVE 04/24/2019 1213   PROTEINUR NEGATIVE 04/24/2019 1213   NITRITE NEGATIVE 04/24/2019 1213   LEUKOCYTESUR NEGATIVE 04/24/2019 1213   LEUKOCYTESUR Trace 10/16/2013 1616   Sepsis Labs Invalid input(s): PROCALCITONIN,  WBC,  LACTICIDVEN Microbiology Recent Results (from the past 240 hour(s))  SARS CORONAVIRUS 2 (TAT 6-24 HRS) Nasopharyngeal Nasopharyngeal Swab     Status: None   Collection Time: 08/17/19  6:33 AM   Specimen: Nasopharyngeal Swab  Result Value Ref Range Status   SARS Coronavirus 2 NEGATIVE NEGATIVE Final    Comment: (NOTE) SARS-CoV-2 target nucleic acids are NOT DETECTED. The SARS-CoV-2 RNA is generally detectable in upper and lower respiratory specimens during the acute phase of infection. Negative results do not preclude SARS-CoV-2 infection, do not rule out co-infections with other pathogens, and should not be used as the sole basis for treatment or other patient management decisions. Negative results must be combined with clinical observations, patient history, and epidemiological information. The expected result is Negative. Fact Sheet for Patients: SugarRoll.be Fact Sheet for Healthcare Providers: https://www.woods-mathews.com/ This test is not  yet approved or cleared by the [place] FDA and  has been authorized for detection and/or diagnosis of SARS-CoV-2 by FDA under an Emergency Use Authorization (EUA). This EUA will remain  in effect (meaning this test can be used) for the duration of the COVID-19 declaration under Section 56 4(b)(1) of the Act, 21 U.S.C. section 360bbb-3(b)(1), unless the authorization is terminated or revoked sooner. Performed at [facility], [place] [address] [place], [place] [address]      Total time spend on discharging this patient, including the last patient exam, discussing the hospital stay, instructions for ongoing care as it relates to all  pertinent caregivers, as well as preparing the medical discharge records, prescriptions, and/or referrals as applicable, is 30 minutes.    Enzo Bi, MD  [facility] 08/18/2019, 11:15 AM  If 7PM-7AM, please contact night-coverage

## 2019-08-18 NOTE — Progress Notes (Signed)
Patient discharged to home. Tele and IV d/c'd. Patient verbalizes understanding of discharge instructions. 

## 2019-08-18 NOTE — Plan of Care (Signed)
  Problem: Clinical Measurements: Goal: Ability to maintain clinical measurements within normal limits will improve Outcome: Progressing   Problem: Activity: Goal: Risk for activity intolerance will decrease Outcome: Progressing   Problem: Pain Managment: Goal: General experience of comfort will improve Outcome: Completed/Met

## 2019-08-18 NOTE — Progress Notes (Signed)
Progress Note  Patient Name: Cheryl Hall Date of Encounter: 08/18/2019  Primary Cardiologist: Fletcher Anon  Subjective   No chest pain, dyspnea, or palpitations. [place] 3/22 showed stable nonobstructive CAD as outlined below. Post cath labs stable. Vitals stable. Wants to go home.   Inpatient Medications    Scheduled Meds: . aspirin  324 mg Oral NOW   Or  . aspirin  300 mg Rectal NOW  . aspirin EC  81 mg Oral Daily  . atorvastatin  40 mg Oral q1800  . carvedilol  6.25 mg Oral BID  . cholecalciferol  1,000 Units Oral Daily  . clopidogrel  75 mg Oral Q breakfast  . enoxaparin (LOVENOX) injection  40 mg Subcutaneous Q24H  . ferrous sulfate  325 mg Oral Daily  . isosorbide mononitrate  15 mg Oral Daily  . lisinopril  40 mg Oral Daily  . pantoprazole  40 mg Oral Daily  . sodium chloride flush  3 mL Intravenous Q12H  . sodium chloride flush  3 mL Intravenous Q12H  . vitamin B-12  1,000 mcg Oral Daily   Continuous Infusions: . sodium chloride    . sodium chloride     PRN Meds: sodium chloride, sodium chloride, acetaminophen, nitroGLYCERIN, ondansetron (ZOFRAN) IV, sodium chloride flush, sodium chloride flush   Vital Signs    Vitals:   08/17/19 1752 08/17/19 1949 08/18/19 0351 08/18/19 0455  BP: 123/62 118/81 119/65   Pulse: (!) 59 (!) 58 66   Resp: 18     Temp: 98.6 F (37 C) 98.1 F (36.7 C) 98.1 F (36.7 C)   TempSrc:  Oral Oral   SpO2: 95% 94% 91%   Weight:    59.7 kg  Height:        Intake/Output Summary (Last 24 hours) at 08/18/2019 0749 Last data filed at 08/18/2019 0701 Gross per 24 hour  Intake 595.09 ml  Output 450 ml  Net 145.09 ml   Filed Weights   08/17/19 0924 08/17/19 1404 08/18/19 0455  Weight: 59.8 kg 59.8 kg 59.7 kg    Telemetry    SR with rates in the low 60s bpm - Personally Reviewed  ECG    No new tracings - Personally Reviewed  Physical Exam   GEN: No acute distress.   Neck: No JVD. Cardiac: RRR, no murmurs, rubs, or gallops.  Right radial cardiac cath site is without bleeding, bruising, swelling, warmth, erythema, or TTP. Radial pulse 2+. Respiratory: Clear to auscultation bilaterally.  GI: Soft, nontender, non-distended.   MS: No edema; No deformity. Neuro:  Alert and oriented x 3; Nonfocal.  Psych: Normal affect.  Labs    Chemistry Recent Labs  Lab 08/17/19 0502 08/17/19 1755 08/18/19 0428  NA 142  --  141  K 4.4  --  4.1  CL 112*  --  110  CO2 24  --  24  GLUCOSE 98  --  81  BUN 21  --  22  CREATININE 1.34* 1.09* 1.11*  CALCIUM 8.6*  --  8.5*  PROT 6.3*  --   --   ALBUMIN 3.6  --   --   AST 18  --   --   ALT 10  --   --   ALKPHOS 77  --   --   BILITOT 0.4  --   --   GFRNONAA 38* 49* 48*  GFRAA 44* 57* 56*  ANIONGAP 6  --  7     Hematology Recent Labs  Lab 08/17/19 0502 08/17/19 1755 08/18/19 0428  WBC 7.2 6.0 4.8  RBC 3.96 3.65* 3.57*  HGB 12.1 11.2* 11.0*  HCT 38.3 35.1* 34.0*  MCV 96.7 96.2 95.2  MCH 30.6 30.7 30.8  MCHC 31.6 31.9 32.4  RDW 14.8 14.7 14.8  PLT 154 152 145*    Cardiac EnzymesNo results for input(s): TROPONINI in the last 168 hours. No results for input(s): TROPIPOC in the last 168 hours.   BNPNo results for input(s): BNP, PROBNP in the last 168 hours.   DDimer No results for input(s): DDIMER in the last 168 hours.   Radiology    DG Chest 2 View  Result Date: 08/17/2019 IMPRESSION: Mild pulmonary vascular congestion. Electronically Signed   By: Prudencio Pair M.D.   On: 08/17/2019 05:46   Cardiac Studies   LHC 08/17/2019:  LM lesion is 30% stenosed.  Mid RCA lesion is 50% stenosed.  Dist RCA lesion is 40% stenosed.  The left ventricular systolic function is normal.  LV end diastolic pressure is mildly elevated.  The left ventricular ejection fraction is 55-65% by visual estimate.  Mid LAD-1 lesion is 40% stenosed.  Mid LAD-2 lesion is 50% stenosed.  Mid LM to Dist LM lesion is 30% stenosed.   1.  Moderately calcified coronary arteries  with mild to moderate nonobstructive coronary artery disease.  No significant change since most recent cardiac catheterization. 2.  Normal LV systolic function and mildly elevated left ventricular end-diastolic pressure.  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  No indication for revascularization.  Continue aggressive medical therapy.  I am going to start the patient on clopidogrel which should be continued for at least 1 year and preferably longer given her recurrent presentation.  I discontinued heparin. I referred to cardiac rehab. Smoking cessation is strongly advised. Possible discharge home tomorrow. __________  2D echo pending  Patient Profile     79 y.o. female with history of mild to moderate nonobstructive CAD, CKD stage II, HTN, HLD, ongoing tobacco use, and GERD who is being seen today for the evaluation of chest pain/elevated troponin at the request of Dr. Francine Graven.  Assessment & Plan    1.  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 HTN: -Blood pressure is well controlled -Continue medications as outlined above  3. HLD: -LDL of 81 this admission with goal LDL < 70  -Titrate Lipitor to 80 mg daily -Follow up FLP and LFT in ~ 8 weeks, if LDL remains above goal at that time, add Zetia  4. Tobacco use: -She has been unable to quit -Complete cessation is again recommended   For questions or updates, please contact [facility] Please consult [email or web address] for contact info under Cardiology/STEMI.    Signed, Christell Faith, PA-C [facility] Pager: [phone] 08/18/2019, 7:49 AM

## 2019-08-24 DIAGNOSIS — I214 Non-ST elevation (NSTEMI) myocardial infarction: Secondary | ICD-10-CM | POA: Diagnosis not present

## 2019-08-24 DIAGNOSIS — I10 Essential (primary) hypertension: Secondary | ICD-10-CM | POA: Diagnosis not present

## 2019-08-24 DIAGNOSIS — I2510 Atherosclerotic heart disease of native coronary artery without angina pectoris: Secondary | ICD-10-CM | POA: Diagnosis not present

## 2019-08-24 DIAGNOSIS — E782 Mixed hyperlipidemia: Secondary | ICD-10-CM | POA: Diagnosis not present

## 2019-08-24 DIAGNOSIS — F17210 Nicotine dependence, cigarettes, uncomplicated: Secondary | ICD-10-CM | POA: Diagnosis not present

## 2019-09-17 ENCOUNTER — Encounter: Payer: Self-pay | Admitting: Cardiovascular Disease

## 2019-09-17 ENCOUNTER — Other Ambulatory Visit: Payer: Self-pay

## 2019-09-17 ENCOUNTER — Other Ambulatory Visit: Payer: Self-pay | Admitting: Cardiovascular Disease

## 2019-09-17 ENCOUNTER — Ambulatory Visit (INDEPENDENT_AMBULATORY_CARE_PROVIDER_SITE_OTHER): Payer: Medicare HMO | Admitting: Cardiovascular Disease

## 2019-09-17 VITALS — BP 132/80 | HR 66 | Ht 63.0 in | Wt 131.4 lb

## 2019-09-17 DIAGNOSIS — Z720 Tobacco use: Secondary | ICD-10-CM

## 2019-09-17 DIAGNOSIS — E782 Mixed hyperlipidemia: Secondary | ICD-10-CM | POA: Diagnosis not present

## 2019-09-17 DIAGNOSIS — I2510 Atherosclerotic heart disease of native coronary artery without angina pectoris: Secondary | ICD-10-CM | POA: Diagnosis not present

## 2019-09-17 DIAGNOSIS — I10 Essential (primary) hypertension: Secondary | ICD-10-CM | POA: Diagnosis not present

## 2019-09-17 NOTE — Patient Instructions (Signed)
Medication Instructions:  Your physician recommends that you continue on your current medications as directed. Please refer to the Current Medication list given to you today.  *If you need a refill on your cardiac medications before your next appointment, please call your pharmacy*   Lab Work: None ordered If you have labs (blood work) drawn today and your tests are completely normal, you will receive your results only by: Marland Kitchen MyChart Message (if you have MyChart) OR . A paper copy in the mail If you have any lab test that is abnormal or we need to change your treatment, we will call you to review the results.   Testing/Procedures: None ordered   Follow-Up: At [facility], you and your health needs are our priority.  As part of our continuing mission to provide you with exceptional heart care, we have created designated Provider Care Teams.  These Care Teams include your primary Cardiologist (physician) and Advanced Practice Providers (APPs -  Physician Assistants and Nurse Practitioners) who all work together to provide you with the care you need, when you need it.  We recommend signing up for the patient portal called "MyChart".  Sign up information is provided on this After Visit Summary.  MyChart is used to connect with patients for Virtual Visits (Telemedicine).  Patients are able to view lab/test results, encounter notes, upcoming appointments, etc.  Non-urgent messages can be sent to your provider as well.   To learn more about what you can do with MyChart, go to [email or web address].    Your next appointment:   As planned in July   The format for your next appointment:   In Person  Provider:    You may see [name], MD or one of the following Advanced Practice Providers on your designated Care Team:    Murray Hodgkins, NP  Christell Faith, PA-C  Marrianne Mood, PA-C    Other Instructions  Coping with Quitting Smoking  Quitting smoking is a physical and  mental challenge. You will face cravings, withdrawal symptoms, and temptation. Before quitting, work with your health care provider to make a plan that can help you cope. Preparation can help you quit and keep you from giving in. How can I cope with cravings? Cravings usually last for 5-10 minutes. If you get through it, the craving will pass. Consider taking the following actions to help you cope with cravings:  Keep your mouth busy: ? Chew sugar-free gum. ? Suck on hard candies or a straw. ? Brush your teeth.  Keep your hands and body busy: ? Immediately change to a different activity when you feel a craving. ? Squeeze or play with a ball. ? Do an activity or a hobby, like making bead jewelry, practicing needlepoint, or working with wood. ? Mix up your normal routine. ? Take a short exercise break. Go for a quick walk or run up and down stairs. ? Spend time in public places where smoking is not allowed.  Focus on doing something kind or helpful for someone else.  Call a friend or family member to talk during a craving.  Join a support group.  Call a quit line, such as 1-800-QUIT-NOW.  Talk with your health care provider about medicines that might help you cope with cravings and make quitting easier for you. How can I deal with withdrawal symptoms? Your body may experience negative effects as it tries to get used to not having nicotine in the system. These effects are called withdrawal symptoms. They may  include:  Feeling hungrier than normal.  Trouble concentrating.  Irritability.  Trouble sleeping.  Feeling depressed.  Restlessness and agitation.  Craving a cigarette. To manage withdrawal symptoms:  Avoid places, people, and activities that trigger your cravings.  Remember why you want to quit.  Get plenty of sleep.  Avoid coffee and other caffeinated drinks. These may worsen some of your symptoms. How can I handle social situations? Social situations can be  difficult when you are quitting smoking, especially in the first few weeks. To manage this, you can:  Avoid parties, bars, and other social situations where people might be smoking.  Avoid alcohol.  Leave right away if you have the urge to smoke.  Explain to your family and friends that you are quitting smoking. Ask for understanding and support.  Plan activities with friends or family where smoking is not an option. What are some ways I can cope with stress? Wanting to smoke may cause stress, and stress can make you want to smoke. Find ways to manage your stress. Relaxation techniques can help. For example:  Breathe slowly and deeply, in through your nose and out through your mouth.  Listen to soothing, relaxing music.  Talk with a family member or friend about your stress.  Light a candle.  Soak in a bath or take a shower.  Think about a peaceful place. What are some ways I can prevent weight gain? Be aware that many people gain weight after they quit smoking. However, not everyone does. To keep from gaining weight, have a plan in place before you quit and stick to the plan after you quit. Your plan should include:  Having healthy snacks. When you have a craving, it may help to: ? Eat plain popcorn, crunchy carrots, celery, or other cut vegetables. ? Chew sugar-free gum.  Changing how you eat: ? Eat small portion sizes at meals. ? Eat 4-6 small meals throughout the day instead of 1-2 large meals a day. ? Be mindful when you eat. Do not watch television or do other things that might distract you as you eat.  Exercising regularly: ? Make time to exercise each day. If you do not have time for a long workout, do short bouts of exercise for 5-10 minutes several times a day. ? Do some form of strengthening exercise, like weight lifting, and some form of aerobic exercise, like running or swimming.  Drinking plenty of water or other low-calorie or no-calorie drinks. Drink 6-8  glasses of water daily, or as much as instructed by your health care provider. Summary  Quitting smoking is a physical and mental challenge. You will face cravings, withdrawal symptoms, and temptation to smoke again. Preparation can help you as you go through these challenges.  You can cope with cravings by keeping your mouth busy (such as by chewing gum), keeping your body and hands busy, and making calls to family, friends, or a helpline for people who want to quit smoking.  You can cope with withdrawal symptoms by avoiding places where people smoke, avoiding drinks with caffeine, and getting plenty of rest.  Ask your health care provider about the different ways to prevent weight gain, avoid stress, and handle social situations. This information is not intended to replace advice given to you by your health care provider. Make sure you discuss any questions you have with your health care provider. Document Revised: 04/26/2017 Document Reviewed: 05/11/2016 Elsevier Patient Education  2020 [employer].

## 2019-09-17 NOTE — Progress Notes (Signed)
Cardiology Office Note   Date:  09/17/2019   ID:  [name], DOB 02/08/1941, MRN [id number]  PCP:  Marinda Elk, MD  Cardiologist:   [name], MD   Chief Complaint  Patient presents with  . OTHER    F/u ED echo no complaints today . Meds reviewed verbally with pt.      History of Present Illness: Cheryl Hall is a 79 y.o. 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 She has known history of hypertension, GERD, hyperlipidemia, ongoing tobacco use and chronic kidney disease stage II .  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  In total, she had 3 presentations with non-ST elevation myocardial infarction since May 2016.  Cardiac catheterization was done on each occasion with no evidence of obstructive disease.  Some of these episodes were in the setting of uncontrolled hypertension.  Renal artery duplex showed normal renal arteries. Most recent presentation was last month to [facility].  High-sensitivity troponin was elevated to 1500.  Cardiac catheterization was done again and showed moderately calcified coronary arteries with mild to moderate nonobstructive disease.  No significant change since most recent cardiac catheterization.  Ejection fraction was normal with mildly elevated left ventricular end-diastolic pressure.  She was placed on clopidogrel.  She has been doing well with no recurrent chest pain or shortness of breath.  She is very hard of hearing.  She reports difficulty quitting smoking but she is going to drive.  Past Medical History:  Diagnosis Date  . Anemia   . Chronic back pain   . CKD (chronic kidney disease), stage II   . Coronary artery disease    a. 09/2014 NSTEMI/Cath: moderate 2-vessel disease;  b. 09/2016 NSTEMI/Cath: LM 30, LAD 50p, 80m, LCX nl, RCA 78m, 50d, EF 55-65%.  . Diverticulitis   . Essential hypertension   . GERD  (gastroesophageal reflux disease)   . H/O Vertebral Fractures   . History of echocardiogram    a. 09/2016 Echo: EF 55-60%, no rwma, mildly dil Ao root (3.7cm) and Asc Ao (3.9cm), mild to mod TR.  Marland Kitchen History of mania   . History of sciatica   . History of tobacco abuse   . Hyperlipidemia   . Peripheral neuropathy     Past Surgical History:  Procedure Laterality Date  . CARDIAC CATHETERIZATION N/A 10/21/2014   Procedure: Left Heart Cath and Coronary Angiography;  Surgeon: Wellington Hampshire, MD;  Location: [place] [facility];  Service: Cardiovascular;  Laterality: N/A;  . CARDIAC CATHETERIZATION  10/21/2014   Dr. Fletcher Anon  . CHOLECYSTECTOMY    . LEFT HEART CATH AND CORONARY ANGIOGRAPHY N/A 10/09/2016   Procedure: Left Heart Cath and Coronary Angiography;  Surgeon: Nelva Bush, MD;  Location: [place] [facility];  Service: Cardiovascular;  Laterality: N/A;  . LEFT HEART CATH AND CORONARY ANGIOGRAPHY N/A 08/17/2019   Procedure: LEFT HEART CATH AND CORONARY ANGIOGRAPHY;  Surgeon: Wellington Hampshire, MD;  Location: [place] [facility];  Service: Cardiovascular;  Laterality: N/A;     Current Outpatient Medications  Medication Sig Dispense Refill  . acetaminophen (TYLENOL) 500 MG tablet Take 500 mg by mouth every 6 (six) hours as needed for mild pain or headache.     . alendronate (FOSAMAX) 70 MG tablet Take 70 mg by mouth once a week.   11  . amLODipine (NORVASC) 5 MG tablet Take 1 tablet by  mouth once daily (Patient taking differently: Take 5 mg by mouth daily. ) 90 tablet 0  . aspirin EC 81 MG EC tablet Take 1 tablet (81 mg total) by mouth daily. 30 tablet 3  . atorvastatin (LIPITOR) 80 MG tablet Take 1 tablet (80 mg total) by mouth daily at 6 PM. 30 tablet 2  . carvedilol (COREG) 6.25 MG tablet Take 1 tablet (6.25 mg total) by mouth 2 (two) times daily. 180 tablet 3  . Cholecalciferol (VITAMIN D-3 PO) Take 1,000 Units by mouth daily.     . clopidogrel (PLAVIX) 75 MG tablet Take 1 tablet  (75 mg total) by mouth daily with breakfast. 30 tablet 11  . ferrous sulfate 325 (65 FE) MG tablet Take 325 mg by mouth daily.    . hydrochlorothiazide (HYDRODIURIL) 12.5 MG tablet Take 12.5 mg by mouth daily.     . isosorbide mononitrate (IMDUR) 30 MG 24 hr tablet Take 15 mg by mouth daily.    Marland Kitchen lisinopril (ZESTRIL) 40 MG tablet Take 1 tablet by mouth once daily 90 tablet 3  . nitroGLYCERIN (NITROSTAT) 0.4 MG SL tablet Place 0.4 mg under the tongue every 5 (five) minutes as needed for chest pain.    . pantoprazole (PROTONIX) 40 MG tablet Take 1 tablet (40 mg total) by mouth daily. 30 tablet 5  . traMADol-acetaminophen (ULTRACET) 37.5-325 MG tablet Take 0.5 tablets by mouth every 6 (six) hours as needed (pain).   0  . vitamin B-12 (CYANOCOBALAMIN) 1000 MCG tablet Take 1,000 mcg by mouth daily.     No current facility-administered medications for this visit.    Allergies:   Patient has no known allergies.    Social History:  The patient  reports that she has been smoking cigarettes. She has a 12.50 pack-year smoking history. She has never used smokeless tobacco. She reports that she does not drink alcohol or use drugs.   Family History:  The patient's family history includes CAD in her brother; Cancer in her father; Other in her mother.    ROS:  Please see the history of present illness.   Otherwise, review of systems are positive for none.   All other systems are reviewed and negative.    PHYSICAL EXAM: VS:  BP 132/80 (BP Location: Left Arm, Patient Position: Sitting, Cuff Size: Normal)   Pulse 66   Ht 5\' 3"  (1.6 m)   Wt 131 lb 6 oz (59.6 kg)   SpO2 97%   BMI 23.27 kg/m  , BMI Body mass index is 23.27 kg/m. GEN: Well nourished, well developed, in no acute distress  HEENT: normal  Neck: no JVD, carotid bruits, or masses Cardiac: RRR; no murmurs, rubs, or gallops,no edema  Respiratory:  clear to auscultation bilaterally, normal work of breathing GI: soft, nontender, nondistended,  + BS MS: no deformity or atrophy  Skin: warm and dry, no rash Neuro:  Strength and sensation are intact Psych: euthymic mood, full affect Right radial pulses normal with no hematoma.  EKG:  EKG is ordered today. The ekg ordered today demonstrates normal sinus rhythm with sinus arrhythmia.  Recent Labs: 08/17/2019: ALT 10 08/18/2019: BUN 22; Creatinine, Ser 1.11; Hemoglobin 11.0; Platelets 145; Potassium 4.1; Sodium 141    Lipid Panel    Component Value Date/Time   CHOL 150 08/18/2019 0428   CHOL 149 01/31/2012 0454   TRIG 91 08/18/2019 0428   TRIG 111 01/31/2012 0454   HDL 51 08/18/2019 0428   HDL 58 01/31/2012 0454  CHOLHDL 2.9 08/18/2019 0428   VLDL 18 08/18/2019 0428   VLDL 22 01/31/2012 0454   LDLCALC 81 08/18/2019 0428   LDLCALC 69 01/31/2012 0454      Wt Readings from Last 3 Encounters:  09/17/19 131 lb 6 oz (59.6 kg)  08/18/19 131 lb 11.2 oz (59.7 kg)  06/12/19 130 lb 8 oz (59.2 kg)        ASSESSMENT AND PLAN:  1.  Coronary artery disease involving native coronary arteries with recurrent myocardial infarction without obstructive disease.  Most recently last month.  I am going to keep her on long-term dual antiplatelet therapy as tolerated.  Continue aggressive treatment of risk factors.    2. Essential hypertension: Blood pressure is well controlled on current medications.  3. Hyperlipidemia: Continue high-dose atorvastatin.  Most recent lipid profile showed an LDL of 81.  4. Tobacco use: I again advised her to quit smoking    Disposition:   FU with me in 3 months  Signed,  [name], MD  09/17/2019 2:42 PM    [place]

## 2019-09-28 DIAGNOSIS — F17210 Nicotine dependence, cigarettes, uncomplicated: Secondary | ICD-10-CM | POA: Diagnosis not present

## 2019-09-28 DIAGNOSIS — R1032 Left lower quadrant pain: Secondary | ICD-10-CM | POA: Diagnosis not present

## 2019-09-28 DIAGNOSIS — K5792 Diverticulitis of intestine, part unspecified, without perforation or abscess without bleeding: Secondary | ICD-10-CM | POA: Diagnosis not present

## 2019-09-28 DIAGNOSIS — R110 Nausea: Secondary | ICD-10-CM | POA: Diagnosis not present

## 2019-09-29 ENCOUNTER — Emergency Department: Payer: Medicare HMO

## 2019-09-29 ENCOUNTER — Emergency Department
Admission: EM | Admit: 2019-09-29 | Discharge: 2019-09-29 | Disposition: A | Discharge status: Home / Self Care | Payer: Medicare HMO | Attending: Emergency Medicine | Admitting: Emergency Medicine

## 2019-09-29 ENCOUNTER — Other Ambulatory Visit: Payer: Self-pay

## 2019-09-29 ENCOUNTER — Encounter: Payer: Self-pay | Admitting: Emergency Medicine

## 2019-09-29 DIAGNOSIS — F17210 Nicotine dependence, cigarettes, uncomplicated: Secondary | ICD-10-CM | POA: Insufficient documentation

## 2019-09-29 DIAGNOSIS — I2510 Atherosclerotic heart disease of native coronary artery without angina pectoris: Secondary | ICD-10-CM | POA: Diagnosis not present

## 2019-09-29 DIAGNOSIS — Z7982 Long term (current) use of aspirin: Secondary | ICD-10-CM | POA: Insufficient documentation

## 2019-09-29 DIAGNOSIS — R0789 Other chest pain: Secondary | ICD-10-CM | POA: Insufficient documentation

## 2019-09-29 DIAGNOSIS — Z79899 Other long term (current) drug therapy: Secondary | ICD-10-CM | POA: Diagnosis not present

## 2019-09-29 DIAGNOSIS — N182 Chronic kidney disease, stage 2 (mild): Secondary | ICD-10-CM | POA: Insufficient documentation

## 2019-09-29 DIAGNOSIS — R079 Chest pain, unspecified: Secondary | ICD-10-CM | POA: Diagnosis not present

## 2019-09-29 DIAGNOSIS — I1310 Hypertensive heart and chronic kidney disease without heart failure, with stage 1 through stage 4 chronic kidney disease, or unspecified chronic kidney disease: Secondary | ICD-10-CM | POA: Insufficient documentation

## 2019-09-29 LAB — BASIC METABOLIC PANEL
Anion gap: 5 (ref 5–15)
BUN: 32 mg/dL — ABNORMAL HIGH (ref 8–23)
CO2: 24 mmol/L (ref 22–32)
Calcium: 8.6 mg/dL — ABNORMAL LOW (ref 8.9–10.3)
Chloride: 111 mmol/L (ref 98–111)
Creatinine, Ser: 1.54 mg/dL — ABNORMAL HIGH (ref 0.44–1.00)
GFR calc Af Amer: 38 mL/min — ABNORMAL LOW (ref >60)
GFR calc non Af Amer: 32 mL/min — ABNORMAL LOW (ref >60)
Glucose, Bld: 100 mg/dL — ABNORMAL HIGH (ref 70–99)
Potassium: 4.1 mmol/L (ref 3.5–5.1)
Sodium: 140 mmol/L (ref 135–145)

## 2019-09-29 LAB — CBC
HCT: 34.2 % — ABNORMAL LOW (ref 36.0–46.0)
Hemoglobin: 11.3 g/dL — ABNORMAL LOW (ref 12.0–15.0)
MCH: 31.1 pg (ref 26.0–34.0)
MCHC: 33 g/dL (ref 30.0–36.0)
MCV: 94.2 fL (ref 80.0–100.0)
Platelets: 157 10*3/uL (ref 150–400)
RBC: 3.63 MIL/uL — ABNORMAL LOW (ref 3.87–5.11)
RDW: 14.8 % (ref 11.5–15.5)
WBC: 5.9 10*3/uL (ref 4.0–10.5)
nRBC: 0 % (ref 0.0–0.2)

## 2019-09-29 LAB — TROPONIN I (HIGH SENSITIVITY)
Troponin I (High Sensitivity): 6 ng/L (ref <18)
Troponin I (High Sensitivity): 5 ng/L (ref <18)

## 2019-09-29 NOTE — Discharge Instructions (Addendum)
We suspect that you could have had acid reflux, or a reaction to the medication.  Your EKG and cardiac blood tests are normal.  You should take the antibiotics today as scheduled.  If you have any recurrent pain or other similar symptoms, call your doctor tomorrow to see whether you should potentially switch antibiotics.  Return to the ER for new, worsening, or persistent severe chest pain, difficulty breathing, weakness or lightheadedness, or any other new or worsening symptoms that concern you.

## 2019-09-29 NOTE — ED Notes (Signed)
Son called and notified that patient was placed in a room.

## 2019-09-29 NOTE — ED Triage Notes (Signed)
Patient reports chest pain that started 45 min prior to arrival. States she tooke 3 nitro at home with decrease from 10/10 to 2/10 pain. Patient reports pain started approximately 20 minutes after taking her antibiotics and nausea medication. Patient was seen at PCP yesterday and diagnosed with diverticulitis.

## 2019-09-29 NOTE — ED Provider Notes (Signed)
[facility] Emergency Department Provider Note ____________________________________________   First MD Initiated Contact with Patient 09/29/19 1504     (approximate)  I have reviewed the triage vital signs and the nursing notes.   HISTORY  Chief Complaint Chest Pain    HPI Cheryl Hall is a 79 y.o. female with PMH as noted below who presents with chest pain, acute onset few hours ago, approximately 20 minutes after she took one of her antibiotics that she was prescribed yesterday for diverticulitis.  The pain was severe in intensity, sharp, and radiated to her back.  It started to resolve after she took a nitroglycerin, and has now completely resolved this was the first time she took both of these antibiotics.  The patient states that she had eaten earlier.  She denies associated shortness of breath, nausea or vomiting, lightheadedness, or other acute symptoms.   Past Medical History:  Diagnosis Date  . Anemia   . Chronic back pain   . CKD (chronic kidney disease), stage II   . Coronary artery disease    a. 09/2014 NSTEMI/Cath: moderate 2-vessel disease;  b. 09/2016 NSTEMI/Cath: LM 30, LAD 50p, 23m, LCX nl, RCA 64m, 50d, EF 55-65%.  . Diverticulitis   . Essential hypertension   . GERD (gastroesophageal reflux disease)   . H/O Vertebral Fractures   . History of echocardiogram    a. 09/2016 Echo: EF 55-60%, no rwma, mildly dil Ao root (3.7cm) and Asc Ao (3.9cm), mild to mod TR.  Marland Kitchen History of mania   . History of sciatica   . History of tobacco abuse   . Hyperlipidemia   . Peripheral neuropathy     Patient Active Problem List   Diagnosis Date Noted  . Moderate tricuspid regurgitation 10/10/2016  . Acute renal insufficiency 10/10/2016  . Hyperglycemia 10/10/2016  . Thrombocytopenia ([place]) 10/10/2016  . CAD in native artery 10/09/2016  . Aortic atherosclerosis ([place]) 09/18/2016  . Colitis   . Lower GI bleed   . Acute colitis 08/18/2016  . Coronary  artery disease involving native coronary artery without angina pectoris 11/15/2014  . Hyperlipidemia   . Essential hypertension   . Tobacco abuse   . CKD (chronic kidney disease), stage II   . GERD (gastroesophageal reflux disease)   . NSTEMI (non-ST elevated myocardial infarction) ([place]) 10/20/2014    Past Surgical History:  Procedure Laterality Date  . CARDIAC CATHETERIZATION N/A 10/21/2014   Procedure: Left Heart Cath and Coronary Angiography;  Surgeon: Wellington Hampshire, MD;  Location: [place] [facility];  Service: Cardiovascular;  Laterality: N/A;  . CARDIAC CATHETERIZATION  10/21/2014   Dr. Fletcher Anon  . CHOLECYSTECTOMY    . LEFT HEART CATH AND CORONARY ANGIOGRAPHY N/A 10/09/2016   Procedure: Left Heart Cath and Coronary Angiography;  Surgeon: Nelva Bush, MD;  Location: [place] [facility];  Service: Cardiovascular;  Laterality: N/A;  . LEFT HEART CATH AND CORONARY ANGIOGRAPHY N/A 08/17/2019   Procedure: LEFT HEART CATH AND CORONARY ANGIOGRAPHY;  Surgeon: Wellington Hampshire, MD;  Location: [place] [facility];  Service: Cardiovascular;  Laterality: N/A;    Prior to Admission medications   Medication Sig Start Date End Date Taking? Authorizing Provider  acetaminophen (TYLENOL) 500 MG tablet Take 500 mg by mouth every 6 (six) hours as needed for mild pain or headache.     [provider]  alendronate (FOSAMAX) 70 MG tablet Take 70 mg by mouth once a week.  03/06/18   [provider]  amLODipine (NORVASC) 5 MG tablet Take 1 tablet by mouth once daily Patient taking differently: Take 5 mg by mouth daily.  07/07/19   Wellington Hampshire, MD  aspirin EC 81 MG EC tablet Take 1 tablet (81 mg total) by mouth daily. 10/11/16   Theodoro Grist, MD  atorvastatin (LIPITOR) 80 MG tablet Take 1 tablet (80 mg total) by mouth daily at 6 PM. 08/18/19 11/16/19  Enzo Bi, MD  carvedilol (COREG) 6.25 MG tablet Take 1 tablet (6.25 mg total) by mouth 2 (two) times daily. 11/25/18   Rise Mu,  PA-C  Cholecalciferol (VITAMIN D-3 PO) Take 1,000 Units by mouth daily.     [provider]  clopidogrel (PLAVIX) 75 MG tablet Take 1 tablet (75 mg total) by mouth daily with breakfast. 08/19/19 08/18/20  Enzo Bi, MD  ferrous sulfate 325 (65 FE) MG tablet Take 325 mg by mouth daily.    [provider]  hydrochlorothiazide (HYDRODIURIL) 12.5 MG tablet Take 12.5 mg by mouth daily.     [provider]  isosorbide mononitrate (IMDUR) 30 MG 24 hr tablet Take 15 mg by mouth daily.    [provider]  lisinopril (ZESTRIL) 40 MG tablet Take 1 tablet by mouth once daily 09/17/19   Wellington Hampshire, MD  nitroGLYCERIN (NITROSTAT) 0.4 MG SL tablet Place 0.4 mg under the tongue every 5 (five) minutes as needed for chest pain.    [provider]  pantoprazole (PROTONIX) 40 MG tablet Take 1 tablet (40 mg total) by mouth daily. 04/18/18   Theora Gianotti, NP  traMADol-acetaminophen (ULTRACET) 37.5-325 MG tablet Take 0.5 tablets by mouth every 6 (six) hours as needed (pain).  01/02/18   [provider]  vitamin B-12 (CYANOCOBALAMIN) 1000 MCG tablet Take 1,000 mcg by mouth daily.    [provider]    Allergies Patient has no known allergies.  Family History  Problem Relation Age of Onset  . Other Mother        died @ 36.  . Cancer Father        died of pancreatic cancer  . CAD Brother        alive in late 46's w/ h/o stenting.  . Breast cancer Neg Hx     Social History Social History   Tobacco Use  . Smoking status: Current Some Day Smoker    Packs/day: 0.25    Years: 50.00    Pack years: 12.50    Types: Cigarettes  . Smokeless tobacco: Never Used  . Tobacco comment: Smoking 2 cigarettes every other day.   Substance Use Topics  . Alcohol use: No    Alcohol/week: 0.0 standard drinks  . Drug use: No    Review of Systems  Constitutional: No fever/chills. Eyes: No visual changes. ENT: No sore throat. Cardiovascular:  Positive for resolved chest pain. Respiratory: Denies shortness of breath. Gastrointestinal: No vomiting or diarrhea.  Genitourinary: Negative for dysuria.  Musculoskeletal: Negative for back pain. Skin: Negative for rash. Neurological: Negative for headache.   ____________________________________________   PHYSICAL EXAM:  VITAL SIGNS: ED Triage Vitals  Enc Vitals Group     BP 09/29/19 1206 (!) 102/53     Pulse Rate 09/29/19 1206 74     Resp 09/29/19 1206 18     Temp 09/29/19 1206 99.2 F (37.3 C)     Temp Source 09/29/19 1206 Oral     SpO2 09/29/19 1206 96 %     Weight 09/29/19 1204 131  lb 2.8 oz (59.5 kg)     Height 09/29/19 1204 5\' 3"  (1.6 m)     Head Circumference --      Peak Flow --      Pain Score --      Pain Loc --      Pain Edu? --      Excl. in [place]? --     Constitutional: Alert and oriented. Well appearing and in no acute distress. Eyes: Conjunctivae are normal.  Head: Atraumatic. Nose: No congestion/rhinnorhea. Mouth/Throat: Mucous membranes are moist.   Neck: Normal range of motion.  Cardiovascular: Normal rate, regular rhythm. Grossly normal heart sounds.  Good peripheral circulation. Respiratory: Normal respiratory effort.  No retractions. Lungs CTAB. Gastrointestinal: Soft and nontender. No distention.  Genitourinary: No flank tenderness. Musculoskeletal: Extremities warm and well perfused.  Neurologic:  Normal speech and language. No gross focal neurologic deficits are appreciated.  Skin:  Skin is warm and dry. No rash noted. Psychiatric: Mood and affect are normal. Speech and behavior are normal.  ____________________________________________   LABS (all labs ordered are listed, but only abnormal results are displayed)  Labs Reviewed  BASIC METABOLIC PANEL - Abnormal; Notable for the following components:      Result Value   Glucose, Bld 100 (*)    BUN 32 (*)    Creatinine, Ser 1.54 (*)    Calcium 8.6 (*)    GFR calc non Af Amer 32 (*)     GFR calc Af Amer 38 (*)    All other components within normal limits  CBC - Abnormal; Notable for the following components:   RBC 3.63 (*)    Hemoglobin 11.3 (*)    HCT 34.2 (*)    All other components within normal limits  TROPONIN I (HIGH SENSITIVITY)  TROPONIN I (HIGH SENSITIVITY)   ____________________________________________  EKG  ED ECG REPORT I, Arta Silence, the attending physician, personally viewed and interpreted this ECG.  Date: 09/29/2019 EKG Time: 1140 Rate: 60 Rhythm: normal sinus rhythm QRS Axis: normal Intervals: normal ST/T Wave abnormalities: Nonspecific T wave flattening anteriorly Narrative Interpretation: Nonspecific T wave abnormalities with no evidence of acute ischemia; no significant change when compared to EKG of 09/17/2019  ____________________________________________  RADIOLOGY  CXR: Left base atelectasis.  No focal infiltrate or other acute abnormality.  ____________________________________________   PROCEDURES  Procedure(s) performed: No  Procedures  Critical Care performed: No ____________________________________________   INITIAL IMPRESSION / ASSESSMENT AND PLAN / ED COURSE  Pertinent labs & imaging results that were available during my care of the patient were reviewed by me and considered in my medical decision making (see chart for details).  79 year old female with PMH as noted above including history of CAD and recent admission for NSTEMI presents with atypical chest pain that started after she took antibiotics, and has now completely resolved.  I reviewed the past medical records in epic.  The patient was admitted last month with an NSTEMI.  She was evaluated by cardiology and taken for catheterization, however the cath showed no acute lesions and the patient did not require intervention.  She was diagnosed with MINOCA.  She was seen by PA Carrie Mew at the [facility] yesterday, diagnosed with diverticulitis, and  started on Cipro, Flagyl, and Phenergan.  It appears that the symptoms began after she took the Cipro.  On exam currently, the patient is well-appearing.  Her vital signs are normal.  The physical exam is otherwise unremarkable.  EKG is nonischemic.  Initial work-up  is unremarkable including chest x-ray and troponin.  Given the patient's history of CAD and relatively recent NSTEMI, her risk of ACS is elevated enough that I would like to check a second troponin.  However, the presentation overall is more consistent with GERD or other GI reaction to the antibiotic given the time course, and my suspicion for ACS on this presentation is very low.  The presentation also is not consistent with PE or vascular etiology given that it resolved spontaneously, the patient appears well, and her vital signs are normal.  ----------------------------------------- 4:17 PM on 09/29/2019 -----------------------------------------  Repeat troponin is negative.  The patient remains asymptomatic.  At this time, there is no evidence of cardiac etiology of her chest pain today.  She is stable for discharge home.  I instructed her to continue with the prescribed antibiotics, as I suspect reflux and there is no evidence that she had any type of dangerous reaction to it.  I instructed her to take the antibiotic with food, rather than just some time after eating.  If she has any similar symptoms, she should call her doctor tomorrow to discuss possibly changing the antibiotic.  I gave her thorough ED return precautions, and she expressed understanding.  ____________________________________________   FINAL CLINICAL IMPRESSION(S) / ED DIAGNOSES  Final diagnoses:  Atypical chest pain      NEW MEDICATIONS STARTED DURING THIS VISIT:  New Prescriptions   No medications on file     Note:  This document was prepared using Dragon voice recognition software and may include unintentional dictation errors.    Arta Silence, MD 09/29/19 [phone]

## 2019-09-30 ENCOUNTER — Other Ambulatory Visit: Payer: Self-pay | Admitting: Physician Assistant

## 2019-09-30 ENCOUNTER — Other Ambulatory Visit: Payer: Self-pay | Admitting: Cardiovascular Disease

## 2019-10-01 DIAGNOSIS — H903 Sensorineural hearing loss, bilateral: Secondary | ICD-10-CM | POA: Diagnosis not present

## 2019-10-01 DIAGNOSIS — H6123 Impacted cerumen, bilateral: Secondary | ICD-10-CM | POA: Diagnosis not present

## 2019-10-01 DIAGNOSIS — H6061 Unspecified chronic otitis externa, right ear: Secondary | ICD-10-CM | POA: Diagnosis not present

## 2019-10-12 DIAGNOSIS — I10 Essential (primary) hypertension: Secondary | ICD-10-CM | POA: Diagnosis not present

## 2019-10-12 DIAGNOSIS — R7303 Prediabetes: Secondary | ICD-10-CM | POA: Diagnosis not present

## 2019-10-12 DIAGNOSIS — E782 Mixed hyperlipidemia: Secondary | ICD-10-CM | POA: Diagnosis not present

## 2019-10-19 DIAGNOSIS — E782 Mixed hyperlipidemia: Secondary | ICD-10-CM | POA: Diagnosis not present

## 2019-10-19 DIAGNOSIS — N183 Chronic kidney disease, stage 3 unspecified: Secondary | ICD-10-CM | POA: Diagnosis not present

## 2019-10-19 DIAGNOSIS — I129 Hypertensive chronic kidney disease with stage 1 through stage 4 chronic kidney disease, or unspecified chronic kidney disease: Secondary | ICD-10-CM | POA: Diagnosis not present

## 2019-10-19 DIAGNOSIS — E1122 Type 2 diabetes mellitus with diabetic chronic kidney disease: Secondary | ICD-10-CM | POA: Diagnosis not present

## 2019-10-19 DIAGNOSIS — K219 Gastro-esophageal reflux disease without esophagitis: Secondary | ICD-10-CM | POA: Diagnosis not present

## 2019-10-19 DIAGNOSIS — Z0000 Encounter for general adult medical examination without abnormal findings: Secondary | ICD-10-CM | POA: Diagnosis not present

## 2019-10-19 DIAGNOSIS — I2510 Atherosclerotic heart disease of native coronary artery without angina pectoris: Secondary | ICD-10-CM | POA: Diagnosis not present

## 2019-10-19 DIAGNOSIS — M810 Age-related osteoporosis without current pathological fracture: Secondary | ICD-10-CM | POA: Diagnosis not present

## 2019-10-19 DIAGNOSIS — I252 Old myocardial infarction: Secondary | ICD-10-CM | POA: Diagnosis not present

## 2019-11-02 ENCOUNTER — Telehealth: Payer: Self-pay | Admitting: Cardiovascular Disease

## 2019-11-02 MED ORDER — ATORVASTATIN CALCIUM 80 MG PO TABS: 80.0000 mg | ORAL_TABLET | Freq: Every day | ORAL | 2 refills | Status: DC

## 2019-11-02 NOTE — Telephone Encounter (Signed)
Spoke with the patients daughter in law. Advised her that when the patient was seen by Dr. Fletcher Anon in April 2021 she was to continue her current dosage of Atorvastatin 80 mg daily.  Beth sts that the patient is tolerating that dosage ok. Refill for Atorvastatin 80 mg qd #30 R-2 sent to the patients pharmacy.

## 2019-11-02 NOTE — Telephone Encounter (Signed)
Patients daughter in law is calling in due to a prescription issue. Patient was prescribed a 40 mg atorvastatin by Dr. Fletcher Anon, after a hospital stay in March she was prescribed to 80 mg daily of atorvastatin but has ran out of that medication. Patients in daughter in law is calling to see if Fletcher Anon would like her to stay on the 80 mg, go back to the 40 mg or what he would recommend. Whatever the change is, a new prescription will be needed and sent to [facility] on [address]

## 2019-11-04 ENCOUNTER — Other Ambulatory Visit: Payer: Self-pay | Admitting: Physician Assistant

## 2019-11-04 DIAGNOSIS — R1032 Left lower quadrant pain: Secondary | ICD-10-CM | POA: Diagnosis not present

## 2019-11-04 DIAGNOSIS — F17210 Nicotine dependence, cigarettes, uncomplicated: Secondary | ICD-10-CM | POA: Diagnosis not present

## 2019-11-04 DIAGNOSIS — Z8719 Personal history of other diseases of the digestive system: Secondary | ICD-10-CM | POA: Diagnosis not present

## 2019-11-04 DIAGNOSIS — R710 Precipitous drop in hematocrit: Secondary | ICD-10-CM | POA: Diagnosis not present

## 2019-11-04 DIAGNOSIS — R109 Unspecified abdominal pain: Secondary | ICD-10-CM | POA: Diagnosis not present

## 2019-11-04 DIAGNOSIS — R195 Other fecal abnormalities: Secondary | ICD-10-CM | POA: Diagnosis not present

## 2019-11-06 ENCOUNTER — Ambulatory Visit: Payer: Medicare HMO

## 2019-11-11 ENCOUNTER — Other Ambulatory Visit: Payer: Self-pay

## 2019-11-11 ENCOUNTER — Ambulatory Visit
Admission: RE | Admit: 2019-11-11 | Discharge: 2019-11-11 | Disposition: A | Discharge status: Home / Self Care | Payer: Medicare HMO | Source: Ambulatory Visit | Attending: Physician Assistant | Admitting: Physician Assistant

## 2019-11-11 DIAGNOSIS — K5730 Diverticulosis of large intestine without perforation or abscess without bleeding: Secondary | ICD-10-CM | POA: Diagnosis not present

## 2019-11-11 DIAGNOSIS — R1032 Left lower quadrant pain: Secondary | ICD-10-CM | POA: Insufficient documentation

## 2019-11-16 DIAGNOSIS — Z8719 Personal history of other diseases of the digestive system: Secondary | ICD-10-CM | POA: Diagnosis not present

## 2019-11-16 DIAGNOSIS — F17210 Nicotine dependence, cigarettes, uncomplicated: Secondary | ICD-10-CM | POA: Diagnosis not present

## 2019-11-16 DIAGNOSIS — R1032 Left lower quadrant pain: Secondary | ICD-10-CM | POA: Diagnosis not present

## 2019-11-16 DIAGNOSIS — R109 Unspecified abdominal pain: Secondary | ICD-10-CM | POA: Diagnosis not present

## 2019-11-16 DIAGNOSIS — R195 Other fecal abnormalities: Secondary | ICD-10-CM | POA: Diagnosis not present

## 2019-11-16 DIAGNOSIS — R710 Precipitous drop in hematocrit: Secondary | ICD-10-CM | POA: Diagnosis not present

## 2019-12-10 ENCOUNTER — Other Ambulatory Visit: Payer: Self-pay

## 2019-12-10 ENCOUNTER — Ambulatory Visit (INDEPENDENT_AMBULATORY_CARE_PROVIDER_SITE_OTHER): Payer: Medicare HMO | Admitting: Cardiovascular Disease

## 2019-12-10 ENCOUNTER — Encounter: Payer: Self-pay | Admitting: Cardiovascular Disease

## 2019-12-10 VITALS — BP 126/70 | HR 58 | Ht 63.0 in | Wt 128.0 lb

## 2019-12-10 DIAGNOSIS — I10 Essential (primary) hypertension: Secondary | ICD-10-CM

## 2019-12-10 DIAGNOSIS — I2510 Atherosclerotic heart disease of native coronary artery without angina pectoris: Secondary | ICD-10-CM | POA: Diagnosis not present

## 2019-12-10 DIAGNOSIS — E782 Mixed hyperlipidemia: Secondary | ICD-10-CM | POA: Diagnosis not present

## 2019-12-10 NOTE — Progress Notes (Signed)
Cardiology Office Note   Date:  12/10/2019   ID:  Cheryl Hall, DOB [birth year], MRN [id number]  PCP:  Marinda Elk, MD  Cardiologist:   [name], MD   Chief Complaint  Patient presents with  . OTHER    6 month f/u no complaints today. Meds reviewed verbally with pt.      History of Present Illness: Cheryl Hall is a 79 y.o. 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 She has known history of hypertension, GERD, hyperlipidemia, ongoing tobacco use and chronic kidney disease stage II .  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  In total, she had 3 presentations with non-ST elevation myocardial infarction since May 2016.  Cardiac catheterization was done on each occasion with no evidence of obstructive disease.  Some of these episodes were in the setting of uncontrolled hypertension.  Renal artery duplex showed normal renal arteries. Most recent presentation was in March 2021.  High-sensitivity troponin was elevated to 1500.  Cardiac catheterization was done again and showed moderately calcified coronary arteries with mild to moderate nonobstructive disease.  No significant change since most recent cardiac catheterization.  Ejection fraction was normal with mildly elevated left ventricular end-diastolic pressure.  She was placed on clopidogrel.    She had an emergency room visit in May with normal troponin.  Symptoms were felt to be due to GERD.  She quit smoking few months ago.  She has been doing well with no recent chest pain or palpitations.  She has stable exertional dyspnea.  She does complain of being tired and fatigued.   Past Medical History:  Diagnosis Date  . Anemia   . Chronic back pain   . CKD (chronic kidney disease), stage II   . Coronary artery disease    a. 09/2014 NSTEMI/Cath: moderate 2-vessel disease;  b. 09/2016  NSTEMI/Cath: LM 30, LAD 50p, 45m, LCX nl, RCA 15m, 50d, EF 55-65%.  . Diverticulitis   . Essential hypertension   . GERD (gastroesophageal reflux disease)   . H/O Vertebral Fractures   . History of echocardiogram    a. 09/2016 Echo: EF 55-60%, no rwma, mildly dil Ao root (3.7cm) and Asc Ao (3.9cm), mild to mod TR.  Marland Kitchen History of mania   . History of sciatica   . History of tobacco abuse   . Hyperlipidemia   . Peripheral neuropathy     Past Surgical History:  Procedure Laterality Date  . CARDIAC CATHETERIZATION N/A 10/21/2014   Procedure: Left Heart Cath and Coronary Angiography;  Surgeon: Wellington Hampshire, MD;  Location: [facility];  Service: Cardiovascular;  Laterality: N/A;  . CARDIAC CATHETERIZATION  10/21/2014   Dr. Fletcher Anon  . CHOLECYSTECTOMY    . LEFT HEART CATH AND CORONARY ANGIOGRAPHY N/A 10/09/2016   Procedure: Left Heart Cath and Coronary Angiography;  Surgeon: Nelva Bush, MD;  Location: [facility];  Service: Cardiovascular;  Laterality: N/A;  . LEFT HEART CATH AND CORONARY ANGIOGRAPHY N/A 08/17/2019   Procedure: LEFT HEART CATH AND CORONARY ANGIOGRAPHY;  Surgeon: Wellington Hampshire, MD;  Location: [facility];  Service: Cardiovascular;  Laterality: N/A;     Current Outpatient Medications  Medication Sig Dispense Refill  . acetaminophen (TYLENOL) 500 MG tablet Take 500 mg by mouth every 6 (six) hours as needed for mild pain or headache.     . alendronate (FOSAMAX) 70 MG  tablet Take 70 mg by mouth once a week.   11  . amLODipine (NORVASC) 5 MG tablet Take 1 tablet by mouth once daily 90 tablet 0  . aspirin EC 81 MG EC tablet Take 1 tablet (81 mg total) by mouth daily. 30 tablet 3  . atorvastatin (LIPITOR) 80 MG tablet Take 1 tablet (80 mg total) by mouth daily at 6 PM. 30 tablet 2  . carvedilol (COREG) 6.25 MG tablet Take 1 tablet (6.25 mg total) by mouth 2 (two) times daily. 180 tablet 3  . Cholecalciferol (VITAMIN D-3 PO) Take 1,000 Units by  mouth daily.     . clopidogrel (PLAVIX) 75 MG tablet Take 1 tablet (75 mg total) by mouth daily with breakfast. 30 tablet 11  . ferrous sulfate 325 (65 FE) MG tablet Take 325 mg by mouth daily.    . hydrochlorothiazide (HYDRODIURIL) 12.5 MG tablet Take 12.5 mg by mouth daily.     . isosorbide mononitrate (IMDUR) 30 MG 24 hr tablet Take 1/2 (one-half) tablet by mouth once daily 45 tablet 0  . lisinopril (ZESTRIL) 40 MG tablet Take 1 tablet by mouth once daily 90 tablet 3  . nitroGLYCERIN (NITROSTAT) 0.4 MG SL tablet Place 0.4 mg under the tongue every 5 (five) minutes as needed for chest pain.    . pantoprazole (PROTONIX) 40 MG tablet Take 1 tablet (40 mg total) by mouth daily. 30 tablet 5  . traMADol-acetaminophen (ULTRACET) 37.5-325 MG tablet Take 0.5 tablets by mouth every 6 (six) hours as needed (pain).   0  . vitamin B-12 (CYANOCOBALAMIN) 1000 MCG tablet Take 1,000 mcg by mouth daily.     No current facility-administered medications for this visit.    Allergies:   Patient has no known allergies.    Social History:  The patient  reports that she has been smoking cigarettes. She has a 12.50 pack-year smoking history. She has never used smokeless tobacco. She reports that she does not drink alcohol and does not use drugs.   Family History:  The patient's family history includes CAD in her brother; Cancer in her father; Other in her mother.    ROS:  Please see the history of present illness.   Otherwise, review of systems are positive for none.   All other systems are reviewed and negative.    PHYSICAL EXAM: VS:  BP 126/70 (BP Location: Left Arm, Patient Position: Sitting, Cuff Size: Normal)   Pulse (!) 58   Ht 5\' 3"  (1.6 m)   Wt 128 lb (58.1 kg)   SpO2 94%   BMI 22.67 kg/m  , BMI Body mass index is 22.67 kg/m. GEN: Well nourished, well developed, in no acute distress  HEENT: normal  Neck: no JVD, carotid bruits, or masses Cardiac: RRR; no murmurs, rubs, or gallops,no edema   Respiratory:  clear to auscultation bilaterally, normal work of breathing GI: soft, nontender, nondistended, + BS MS: no deformity or atrophy  Skin: warm and dry, no rash Neuro:  Strength and sensation are intact Psych: euthymic mood, full affect Right radial pulses normal with no hematoma.  EKG:  EKG is ordered today. The ekg ordered today demonstrates sinus bradycardia with no significant ST or T wave changes.  Recent Labs: 08/17/2019: ALT 10 09/29/2019: BUN 32; Creatinine, Ser 1.54; Hemoglobin 11.3; Platelets 157; Potassium 4.1; Sodium 140    Lipid Panel    Component Value Date/Time   CHOL 150 08/18/2019 0428   CHOL 149 01/31/2012 0454   TRIG 91  08/18/2019 0428   TRIG 111 01/31/2012 0454   HDL 51 08/18/2019 0428   HDL 58 01/31/2012 0454   CHOLHDL 2.9 08/18/2019 0428   VLDL 18 08/18/2019 0428   VLDL 22 01/31/2012 0454   LDLCALC 81 08/18/2019 0428   LDLCALC 69 01/31/2012 0454      Wt Readings from Last 3 Encounters:  12/10/19 128 lb (58.1 kg)  09/29/19 131 lb 2.8 oz (59.5 kg)  09/17/19 131 lb 6 oz (59.6 kg)        ASSESSMENT AND PLAN:  1.  Coronary artery disease involving native coronary arteries with recurrent myocardial infarction without obstructive disease.  Currently with no anginal symptoms.  Continue dual antiplatelet therapy as tolerated.  Continue treatment of risk factors.  2. Essential hypertension: Blood pressure is well controlled on current medications.  3. Hyperlipidemia: Continue high-dose atorvastatin.  Most recent lipid profile showed an LDL of 81.  4. Tobacco use: I congratulated her on smoking cessation.   Disposition:   FU with me in 6 months  Signed,  [name], MD  12/10/2019 9:32 AM    [place]

## 2019-12-10 NOTE — Patient Instructions (Signed)
Medication Instructions:  No changes  *If you need a refill on your cardiac medications before your next appointment, please call your pharmacy*   Lab Work: None  If you have labs (blood work) drawn today and your tests are completely normal, you will receive your results only by: . MyChart Message (if you have MyChart) OR . A paper copy in the mail If you have any lab test that is abnormal or we need to change your treatment, we will call you to review the results.   Testing/Procedures: None   Follow-Up: At [facility], you and your health needs are our priority.  As part of our continuing mission to provide you with exceptional heart care, we have created designated Provider Care Teams.  These Care Teams include your primary Cardiologist (physician) and Advanced Practice Providers (APPs -  Physician Assistants and Nurse Practitioners) who all work together to provide you with the care you need, when you need it.  We recommend signing up for the patient portal called "MyChart".  Sign up information is provided on this After Visit Summary.  MyChart is used to connect with patients for Virtual Visits (Telemedicine).  Patients are able to view lab/test results, encounter notes, upcoming appointments, etc.  Non-urgent messages can be sent to your provider as well.   To learn more about what you can do with MyChart, go to [email or web address].    Your next appointment:   6 month(s)  The format for your next appointment:   In Person  Provider:    You may see Muhammad Arida, MD or one of the following Advanced Practice Providers on your designated Care Team:    Christopher Berge, NP  Ryan Dunn, PA-C  Jacquelyn Visser, PA-C  

## 2019-12-20 ENCOUNTER — Other Ambulatory Visit: Payer: Self-pay | Admitting: Physician Assistant

## 2020-01-03 ENCOUNTER — Other Ambulatory Visit: Payer: Self-pay | Admitting: Cardiovascular Disease

## 2020-01-07 ENCOUNTER — Other Ambulatory Visit: Payer: Self-pay | Admitting: Cardiovascular Disease

## 2020-01-19 DIAGNOSIS — K5792 Diverticulitis of intestine, part unspecified, without perforation or abscess without bleeding: Secondary | ICD-10-CM | POA: Diagnosis not present

## 2020-01-19 DIAGNOSIS — R1084 Generalized abdominal pain: Secondary | ICD-10-CM | POA: Diagnosis not present

## 2020-01-28 ENCOUNTER — Other Ambulatory Visit: Payer: Self-pay | Admitting: Cardiovascular Disease

## 2020-02-04 DIAGNOSIS — H6061 Unspecified chronic otitis externa, right ear: Secondary | ICD-10-CM | POA: Diagnosis not present

## 2020-02-04 DIAGNOSIS — H6123 Impacted cerumen, bilateral: Secondary | ICD-10-CM | POA: Diagnosis not present

## 2020-04-06 DIAGNOSIS — R109 Unspecified abdominal pain: Secondary | ICD-10-CM | POA: Diagnosis not present

## 2020-04-06 DIAGNOSIS — Z79899 Other long term (current) drug therapy: Secondary | ICD-10-CM | POA: Diagnosis not present

## 2020-04-08 ENCOUNTER — Other Ambulatory Visit: Payer: Self-pay | Admitting: Gastroenterology

## 2020-04-08 DIAGNOSIS — R1030 Lower abdominal pain, unspecified: Secondary | ICD-10-CM

## 2020-04-13 DIAGNOSIS — I10 Essential (primary) hypertension: Secondary | ICD-10-CM | POA: Diagnosis not present

## 2020-04-13 DIAGNOSIS — E119 Type 2 diabetes mellitus without complications: Secondary | ICD-10-CM | POA: Diagnosis not present

## 2020-04-13 DIAGNOSIS — E782 Mixed hyperlipidemia: Secondary | ICD-10-CM | POA: Diagnosis not present

## 2020-04-25 ENCOUNTER — Ambulatory Visit
Admission: RE | Admit: 2020-04-25 | Discharge: 2020-04-25 | Disposition: A | Discharge status: Home / Self Care | Payer: Medicare HMO | Source: Ambulatory Visit | Attending: Gastroenterology | Admitting: Gastroenterology

## 2020-04-25 ENCOUNTER — Other Ambulatory Visit: Payer: Self-pay

## 2020-04-25 DIAGNOSIS — K429 Umbilical hernia without obstruction or gangrene: Secondary | ICD-10-CM | POA: Diagnosis not present

## 2020-04-25 DIAGNOSIS — R1030 Lower abdominal pain, unspecified: Secondary | ICD-10-CM | POA: Insufficient documentation

## 2020-04-25 DIAGNOSIS — N281 Cyst of kidney, acquired: Secondary | ICD-10-CM | POA: Diagnosis not present

## 2020-04-25 DIAGNOSIS — N2881 Hypertrophy of kidney: Secondary | ICD-10-CM | POA: Diagnosis not present

## 2020-04-25 DIAGNOSIS — M4186 Other forms of scoliosis, lumbar region: Secondary | ICD-10-CM | POA: Diagnosis not present

## 2020-04-25 MED ORDER — IOHEXOL 350 MG/ML SOLN
75.0000 mL | Freq: Once | INTRAVENOUS | Status: AC | PRN
  Administered 2020-04-25: 75 mL via INTRAVENOUS

## 2020-05-11 DIAGNOSIS — R079 Chest pain, unspecified: Secondary | ICD-10-CM | POA: Diagnosis not present

## 2020-05-11 DIAGNOSIS — M549 Dorsalgia, unspecified: Secondary | ICD-10-CM | POA: Diagnosis not present

## 2020-05-11 DIAGNOSIS — R0789 Other chest pain: Secondary | ICD-10-CM | POA: Diagnosis not present

## 2020-05-30 DIAGNOSIS — E1122 Type 2 diabetes mellitus with diabetic chronic kidney disease: Secondary | ICD-10-CM | POA: Diagnosis not present

## 2020-05-30 DIAGNOSIS — D509 Iron deficiency anemia, unspecified: Secondary | ICD-10-CM | POA: Diagnosis not present

## 2020-05-30 DIAGNOSIS — I774 Celiac artery compression syndrome: Secondary | ICD-10-CM | POA: Diagnosis not present

## 2020-05-30 DIAGNOSIS — I129 Hypertensive chronic kidney disease with stage 1 through stage 4 chronic kidney disease, or unspecified chronic kidney disease: Secondary | ICD-10-CM | POA: Diagnosis not present

## 2020-05-30 DIAGNOSIS — M810 Age-related osteoporosis without current pathological fracture: Secondary | ICD-10-CM | POA: Diagnosis not present

## 2020-05-30 DIAGNOSIS — K5790 Diverticulosis of intestine, part unspecified, without perforation or abscess without bleeding: Secondary | ICD-10-CM | POA: Diagnosis not present

## 2020-05-30 DIAGNOSIS — E782 Mixed hyperlipidemia: Secondary | ICD-10-CM | POA: Diagnosis not present

## 2020-05-30 DIAGNOSIS — N182 Chronic kidney disease, stage 2 (mild): Secondary | ICD-10-CM | POA: Diagnosis not present

## 2020-05-30 DIAGNOSIS — I2510 Atherosclerotic heart disease of native coronary artery without angina pectoris: Secondary | ICD-10-CM | POA: Diagnosis not present

## 2020-05-31 ENCOUNTER — Other Ambulatory Visit: Payer: Self-pay

## 2020-05-31 ENCOUNTER — Ambulatory Visit: Payer: Medicare HMO | Admitting: Cardiovascular Disease

## 2020-05-31 ENCOUNTER — Encounter: Payer: Self-pay | Admitting: Cardiovascular Disease

## 2020-05-31 VITALS — BP 120/64 | HR 69 | Ht 62.0 in | Wt 123.5 lb

## 2020-05-31 DIAGNOSIS — I10 Essential (primary) hypertension: Secondary | ICD-10-CM | POA: Diagnosis not present

## 2020-05-31 DIAGNOSIS — E782 Mixed hyperlipidemia: Secondary | ICD-10-CM | POA: Diagnosis not present

## 2020-05-31 DIAGNOSIS — I2510 Atherosclerotic heart disease of native coronary artery without angina pectoris: Secondary | ICD-10-CM | POA: Diagnosis not present

## 2020-05-31 NOTE — Progress Notes (Signed)
Cardiology Office Note   Date:  05/31/2020   ID:  Cheryl Hall, DOB [birth year], MRN [id number]  PCP:  Patrice Paradise, MD  Cardiologist:   Lorine Bears, MD   Chief Complaint  Patient presents with  . Follow-up    6 month. Medications reviewed by the patient verbally. "doing well."       History of Present Illness: Cheryl Hall is a 80 y.o. 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 She has known history of hypertension, GERD, hyperlipidemia, ongoing tobacco use and chronic kidney disease stage II .  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  In total, she had 3 presentations with non-ST elevation myocardial infarction since May 2016.  Cardiac catheterization was done on each occasion with no evidence of obstructive disease.  Some of these episodes were in the setting of uncontrolled hypertension.  Renal artery duplex showed normal renal arteries. Most recent presentation was in March 2021.  High-sensitivity troponin was elevated to 1500.  Cardiac catheterization was done again and showed moderately calcified coronary arteries with mild to moderate nonobstructive disease.  No significant change since most recent cardiac catheterization.  Ejection fraction was normal with mildly elevated left ventricular end-diastolic pressure.  She was placed on clopidogrel.   She quit smoking after her most recent myocardial infarction.  She has been doing reasonably well from a cardiac standpoint with no recurrent chest pain or significant dyspnea.  Biggest issue seems to be back pain and was told about an old fracture in her vertebrae.   Past Medical History:  Diagnosis Date  . Anemia   . Chronic back pain   . CKD (chronic kidney disease), stage II   . Coronary artery disease    a. 09/2014 NSTEMI/Cath: moderate 2-vessel disease;  b. 09/2016 NSTEMI/Cath: LM 30, LAD  50p, 21m, LCX nl, RCA 54m, 50d, EF 55-65%.  . Diverticulitis   . Essential hypertension   . GERD (gastroesophageal reflux disease)   . H/O Vertebral Fractures   . History of echocardiogram    a. 09/2016 Echo: EF 55-60%, no rwma, mildly dil Ao root (3.7cm) and Asc Ao (3.9cm), mild to mod TR.  Marland Kitchen History of mania   . History of sciatica   . History of tobacco abuse   . Hyperlipidemia   . Peripheral neuropathy     Past Surgical History:  Procedure Laterality Date  . CARDIAC CATHETERIZATION N/A 10/21/2014   Procedure: Left Heart Cath and Coronary Angiography;  Surgeon: Iran Ouch, MD;  Location: [facility];  Service: Cardiovascular;  Laterality: N/A;  . CARDIAC CATHETERIZATION  10/21/2014   Dr. Kirke Corin  . CHOLECYSTECTOMY    . LEFT HEART CATH AND CORONARY ANGIOGRAPHY N/A 10/09/2016   Procedure: Left Heart Cath and Coronary Angiography;  Surgeon: Yvonne Kendall, MD;  Location: [facility];  Service: Cardiovascular;  Laterality: N/A;  . LEFT HEART CATH AND CORONARY ANGIOGRAPHY N/A 08/17/2019   Procedure: LEFT HEART CATH AND CORONARY ANGIOGRAPHY;  Surgeon: Iran Ouch, MD;  Location: [facility];  Service: Cardiovascular;  Laterality: N/A;     Current Outpatient Medications  Medication Sig Dispense Refill  . acetaminophen (TYLENOL) 500 MG tablet Take 500 mg by mouth every 6 (six) hours as needed for mild pain or headache.     . alendronate (FOSAMAX) 70 MG tablet Take 70 mg by mouth once a week.  11  . amLODipine (NORVASC) 5 MG tablet Take 1 tablet by mouth once daily 90 tablet 1  . aspirin EC 81 MG EC tablet Take 1 tablet (81 mg total) by mouth daily. 30 tablet 3  . atorvastatin (LIPITOR) 80 MG tablet TAKE 1 TABLET BY MOUTH ONCE DAILY AT  6PM 90 tablet 3  . carvedilol (COREG) 6.25 MG tablet Take 1 tablet by mouth twice daily 180 tablet 1  . Cholecalciferol (VITAMIN D-3 PO) Take 1,000 Units by mouth daily.     . clopidogrel (PLAVIX) 75 MG tablet Take 1  tablet (75 mg total) by mouth daily with breakfast. 30 tablet 11  . ferrous sulfate 325 (65 FE) MG tablet Take 325 mg by mouth daily.    . hydrochlorothiazide (HYDRODIURIL) 12.5 MG tablet Take 12.5 mg by mouth daily.     . isosorbide mononitrate (IMDUR) 30 MG 24 hr tablet Take 1/2 (one-half) tablet by mouth once daily 45 tablet 1  . lisinopril (ZESTRIL) 40 MG tablet Take 1 tablet by mouth once daily 90 tablet 3  . nitroGLYCERIN (NITROSTAT) 0.4 MG SL tablet Place 0.4 mg under the tongue every 5 (five) minutes as needed for chest pain.    . pantoprazole (PROTONIX) 40 MG tablet Take 1 tablet (40 mg total) by mouth daily. 30 tablet 5  . traMADol-acetaminophen (ULTRACET) 37.5-325 MG tablet Take 0.5 tablets by mouth every 6 (six) hours as needed (pain).   0  . vitamin B-12 (CYANOCOBALAMIN) 1000 MCG tablet Take 1,000 mcg by mouth daily.     No current facility-administered medications for this visit.    Allergies:   Patient has no known allergies.    Social History:  The patient  reports that she has been smoking cigarettes. She has a 12.50 pack-year smoking history. She has never used smokeless tobacco. She reports that she does not drink alcohol and does not use drugs.   Family History:  The patient's family history includes CAD in her brother; Cancer in her father; Other in her mother.    ROS:  Please see the history of present illness.   Otherwise, review of systems are positive for none.   All other systems are reviewed and negative.    PHYSICAL EXAM: VS:  BP 120/64 (BP Location: Left Arm, Patient Position: Sitting, Cuff Size: Normal)   Pulse 69   Ht 5\' 2"  (1.575 m)   Wt 123 lb 8 oz (56 kg)   SpO2 97%   BMI 22.59 kg/m  , BMI Body mass index is 22.59 kg/m. GEN: Well nourished, well developed, in no acute distress  HEENT: normal  Neck: no JVD, carotid bruits, or masses Cardiac: RRR; no murmurs, rubs, or gallops,no edema  Respiratory:  clear to auscultation bilaterally, normal work of  breathing GI: soft, nontender, nondistended, + BS MS: no deformity or atrophy  Skin: warm and dry, no rash Neuro:  Strength and sensation are intact Psych: euthymic mood, full affect   EKG:  EKG is ordered today. The ekg ordered today demonstrates normal sinus rhythm with possible old septal infarct.  Recent Labs: 08/17/2019: ALT 10 09/29/2019: BUN 32; Creatinine, Ser 1.54; Hemoglobin 11.3; Platelets 157; Potassium 4.1; Sodium 140    Lipid Panel    Component Value Date/Time   CHOL 150 08/18/2019 0428   CHOL 149 01/31/2012 0454   TRIG 91 08/18/2019 0428   TRIG 111 01/31/2012 0454   HDL 51 08/18/2019 0428   HDL 58 01/31/2012 0454   CHOLHDL 2.9 08/18/2019  0428   VLDL 18 08/18/2019 0428   VLDL 22 01/31/2012 0454   LDLCALC 81 08/18/2019 0428   LDLCALC 69 01/31/2012 0454      Wt Readings from Last 3 Encounters:  05/31/20 123 lb 8 oz (56 kg)  12/10/19 128 lb (58.1 kg)  09/29/19 131 lb 2.8 oz (59.5 kg)        ASSESSMENT AND PLAN:  1.  Coronary artery disease involving native coronary arteries with recurrent myocardial infarction without obstructive disease.  Currently with no anginal symptoms.  Continue dual antiplatelet therapy as tolerated.  Continue treatment of risk factors.  2. Essential hypertension: Blood pressure is well controlled on current medications.  3. Hyperlipidemia: Continue high-dose atorvastatin.  I reviewed most recent lipid profile done in November which showed an LDL of 66.  4.  Previous tobacco use: She quit last year so far no relapse.   Disposition:   FU with me in 6 months  Signed,  [name], MD  05/31/2020 2:33 PM    [place]

## 2020-05-31 NOTE — Patient Instructions (Signed)

## 2020-06-17 ENCOUNTER — Other Ambulatory Visit: Payer: Self-pay | Admitting: Cardiovascular Disease

## 2020-06-27 ENCOUNTER — Other Ambulatory Visit: Payer: Self-pay | Admitting: Cardiovascular Disease

## 2020-06-27 NOTE — Telephone Encounter (Signed)
Rx request sent to pharmacy.  

## 2020-07-01 ENCOUNTER — Encounter (INDEPENDENT_AMBULATORY_CARE_PROVIDER_SITE_OTHER): Payer: Self-pay | Admitting: Vascular Surgery

## 2020-07-01 ENCOUNTER — Ambulatory Visit (INDEPENDENT_AMBULATORY_CARE_PROVIDER_SITE_OTHER): Payer: Medicare HMO | Admitting: Vascular Surgery

## 2020-07-01 ENCOUNTER — Other Ambulatory Visit: Payer: Self-pay

## 2020-07-01 VITALS — BP 135/68 | HR 76 | Resp 16 | Wt 124.4 lb

## 2020-07-01 DIAGNOSIS — I771 Stricture of artery: Secondary | ICD-10-CM

## 2020-07-01 DIAGNOSIS — I10 Essential (primary) hypertension: Secondary | ICD-10-CM

## 2020-07-01 DIAGNOSIS — E782 Mixed hyperlipidemia: Secondary | ICD-10-CM

## 2020-07-01 DIAGNOSIS — I774 Celiac artery compression syndrome: Secondary | ICD-10-CM

## 2020-07-01 DIAGNOSIS — N182 Chronic kidney disease, stage 2 (mild): Secondary | ICD-10-CM

## 2020-07-01 NOTE — Assessment & Plan Note (Signed)
blood pressure control important in reducing the progression of atherosclerotic disease. On appropriate oral medications.  

## 2020-07-01 NOTE — H&P (View-Only) (Signed)
Patient ID: Cheryl Hall, female   DOB: Apr 15, 1941, 80 y.o.   MRN: [id number]  Chief Complaint  Patient presents with  . Follow-up    Est pt,ref [name] cta done Nov 2021    HPI Cheryl Hall is a 80 y.o. female.  I am asked to see the patient by Dr. Haig Prophet for evaluation of abdominal pain and an abnormal finding on a recent CT angiogram which I have independently reviewed.  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  The most prominent finding is of and near occlusive stenosis and potentially an occlusion by my review of the celiac artery.  The SMA and IMA appear to have good flow without obvious stenosis.  The patient describes abdominal fullness and bloating.  She says she is unable to eat much before her abdomen becomes very bloated.  She does have upper abdominal pain.  She has also had unintentional weight loss.  She has other issues including chronic back pain and some chronic reflux, but this new pain and bloating is different.  This has been going on for several months now.  There is no clear cause or inciting event that started the symptoms.     Past Medical History:  Diagnosis Date  . Anemia   . Chronic back pain   . CKD (chronic kidney disease), stage II   . Coronary artery disease    a. 09/2014 NSTEMI/Cath: moderate 2-vessel disease;  b. 09/2016 NSTEMI/Cath: LM 30, LAD 50p, 57m, LCX nl, RCA 70m, 50d, EF 55-65%.  . Diverticulitis   . Essential hypertension   . GERD (gastroesophageal reflux disease)   . H/O Vertebral Fractures   . History of echocardiogram    a. 09/2016 Echo: EF 55-60%, no rwma, mildly dil Ao root (3.7cm) and Asc Ao (3.9cm), mild to mod TR.  Marland Kitchen History of mania   . History of sciatica   . History of tobacco abuse   . Hyperlipidemia   . Peripheral neuropathy     Past Surgical History:  Procedure Laterality Date  . CARDIAC CATHETERIZATION N/A 10/21/2014   Procedure: Left Heart Cath and Coronary Angiography;   Surgeon: Wellington Hampshire, MD;  Location: [facility];  Service: Cardiovascular;  Laterality: N/A;  . CARDIAC CATHETERIZATION  10/21/2014   Dr. Fletcher Anon  . CHOLECYSTECTOMY    . LEFT HEART CATH AND CORONARY ANGIOGRAPHY N/A 10/09/2016   Procedure: Left Heart Cath and Coronary Angiography;  Surgeon: Nelva Bush, MD;  Location: [facility];  Service: Cardiovascular;  Laterality: N/A;  . LEFT HEART CATH AND CORONARY ANGIOGRAPHY N/A 08/17/2019   Procedure: LEFT HEART CATH AND CORONARY ANGIOGRAPHY;  Surgeon: Wellington Hampshire, MD;  Location: [facility];  Service: Cardiovascular;  Laterality: N/A;     Family History  Problem Relation Age of Onset  . Other Mother        died @ 48.  . Cancer Father        died of pancreatic cancer  . CAD Brother        alive in late 75's w/ h/o stenting.  . Breast cancer Neg Hx      Social History   Tobacco Use  . Smoking status: Former Smoker    Packs/day: 0.25    Years: 50.00    Pack years: 12.50    Types: Cigarettes  . Smokeless tobacco: Never Used  . Tobacco comment: Smoking 2 cigarettes every other day.   Vaping Use  .  Vaping Use: Never used  Substance Use Topics  . Alcohol use: No    Alcohol/week: 0.0 standard drinks  . Drug use: No    No Known Allergies  Current Outpatient Medications  Medication Sig Dispense Refill  . acetaminophen (TYLENOL) 500 MG tablet Take 500 mg by mouth every 6 (six) hours as needed for mild pain or headache.     . alendronate (FOSAMAX) 70 MG tablet Take 70 mg by mouth once a week.   11  . amLODipine (NORVASC) 5 MG tablet Take 1 tablet by mouth once daily 90 tablet 1  . aspirin EC 81 MG EC tablet Take 1 tablet (81 mg total) by mouth daily. 30 tablet 3  . atorvastatin (LIPITOR) 80 MG tablet TAKE 1 TABLET BY MOUTH ONCE DAILY AT  6PM 90 tablet 3  . carvedilol (COREG) 6.25 MG tablet Take 1 tablet by mouth twice daily 180 tablet 3  . Cholecalciferol (VITAMIN D-3 PO) Take 1,000 Units by  mouth daily.     . clopidogrel (PLAVIX) 75 MG tablet Take 1 tablet (75 mg total) by mouth daily with breakfast. 30 tablet 11  . ferrous sulfate 325 (65 FE) MG tablet Take 325 mg by mouth daily.    . hydrochlorothiazide (HYDRODIURIL) 12.5 MG tablet Take 12.5 mg by mouth daily.     . isosorbide mononitrate (IMDUR) 30 MG 24 hr tablet Take 1/2 (one-half) tablet by mouth once daily 45 tablet 1  . lisinopril (ZESTRIL) 40 MG tablet Take 1 tablet by mouth once daily 90 tablet 3  . nitroGLYCERIN (NITROSTAT) 0.4 MG SL tablet Place 0.4 mg under the tongue every 5 (five) minutes as needed for chest pain.    . pantoprazole (PROTONIX) 40 MG tablet Take 1 tablet (40 mg total) by mouth daily. 30 tablet 5  . traMADol-acetaminophen (ULTRACET) 37.5-325 MG tablet Take 0.5 tablets by mouth every 6 (six) hours as needed (pain).   0  . vitamin B-12 (CYANOCOBALAMIN) 1000 MCG tablet Take 1,000 mcg by mouth daily.     No current facility-administered medications for this visit.      REVIEW OF SYSTEMS (Negative unless checked)  Constitutional: [x] Weight loss  [] Fever  [] Chills Cardiac: [] Chest pain   [] Chest pressure   [] Palpitations   [] Shortness of breath when laying flat   [] Shortness of breath at rest   [] Shortness of breath with exertion. Vascular:  [] Pain in legs with walking   [] Pain in legs at rest   [] Pain in legs when laying flat   [] Claudication   [] Pain in feet when walking  [] Pain in feet at rest  [] Pain in feet when laying flat   [] History of DVT   [] Phlebitis   [] Swelling in legs   [] Varicose veins   [] Non-healing ulcers Pulmonary:   [] Uses home oxygen   [] Productive cough   [] Hemoptysis   [] Wheeze  [] COPD   [] Asthma Neurologic:  [] Dizziness  [] Blackouts   [] Seizures   [] History of stroke   [] History of TIA  [] Aphasia   [] Temporary blindness   [] Dysphagia   [] Weakness or numbness in arms   [] Weakness or numbness in legs Musculoskeletal:  [x] Arthritis   [] Joint swelling   [] Joint pain   [x] Low back  pain Hematologic:  [] Easy bruising  [] Easy bleeding   [] Hypercoagulable state   [] Anemic  [] Hepatitis Gastrointestinal:  [] Blood in stool   [] Vomiting blood  [x] Gastroesophageal reflux/heartburn   [x] Abdominal pain Genitourinary:  [] Chronic kidney disease   [] Difficult urination  [] Frequent urination  [] Burning with  urination   [] Hematuria Skin:  [] Rashes   [] Ulcers   [] Wounds Psychological:  [] History of anxiety   []  History of major depression.    Physical Exam BP 135/68 (BP Location: Left Arm)   Pulse 76   Resp 16   Wt 124 lb 6.4 oz (56.4 kg)   BMI 22.75 kg/m  Gen:  WD/WN, NAD. Appears younger than stated age. Head: [place]/AT, No temporalis wasting.  Ear/Nose/Throat: Hearing grossly intact, nares w/o erythema or drainage, oropharynx w/o Erythema/Exudate Eyes: Conjunctiva clear, sclera non-icteric  Neck: trachea midline.  No JVD.  Pulmonary:  Good air movement, respirations not labored, no use of accessory muscles  Cardiac: RRR, no JVD Vascular:  Vessel Right Left  Radial Palpable Palpable                                   Gastrointestinal:. No masses, surgical incisions, or scars.  Mildly tender to palpation in the upper abdomen. Musculoskeletal: M/S 5/5 throughout.  Extremities without ischemic changes.  No deformity or atrophy. No edema. Neurologic: Sensation grossly intact in extremities.  Symmetrical.  Speech is fluent. Motor exam as listed above. Psychiatric: Judgment intact, Mood & affect appropriate for pt's clinical situation. Dermatologic: No rashes or ulcers noted.  No cellulitis or open wounds.    Radiology No results found.  Labs No results found for this or any previous visit (from the past 2160 hour(s)).  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  The most prominent finding is of and near occlusive  stenosis and potentially an occlusion by my review of the celiac artery.  The SMA and IMA appear to have good flow without obvious stenosis.  This was not mentioned in the official report, but my review of the scan also may suggest some degree of a median arcuate ligament situation in the celiac artery area. She does have symptoms that are worrisome and likely related to her celiac artery stenosis.  Pain, weight loss, and early satiety with bloating are frequent with chronic mesenteric ischemic symptoms.  She does not have any evidence of acute mesenteric ischemia.  Given these findings and no other clear cause of her symptoms on the CT scan, considering angiogram with possible revascularization is prudent.  We discussed that she did not have significant findings of atherosclerosis and this was felt to be extrinsic compression, consideration for release for median arcuate ligament would be considered and I would refer her for that.  Essential hypertension blood pressure control important in reducing the progression of atherosclerotic disease. On appropriate oral medications.   CKD (chronic kidney disease), stage II Mild.  Hydrate and limit contrast with angiogram.  Hyperlipidemia lipid control important in reducing the progression of atherosclerotic disease. Continue statin therapy       Leotis Pain 07/01/2020, 10:31 AM   This note was created with Dragon medical transcription system.  Any errors from dictation are unintentional.

## 2020-07-01 NOTE — Patient Instructions (Signed)
Chronic Mesenteric Ischemia  Chronic mesenteric ischemia is poor blood flow (circulation) in the vessels that supply blood to the stomach, intestines, and liver (mesenteric organs). When the blood supply is severely restricted, these organs cannot work properly. This condition is also called mesenteric angina, or intestinal angina. This condition is a long-term (chronic) condition. It happens when an artery or vein that provides blood to the mesenteric organs gradually becomes blocked or narrows over time, restricting the blood supply to these organs. What are the causes? This condition is commonly caused by fatty deposits that build up in an artery (plaque), which can narrow the artery and restrict blood flow. Other causes include:  Weakened areas in blood vessel walls (aneurysms).  Conditions that cause twisting or inflammation of blood vessels, such as fibromuscular dysplasia or arteritis.  A disorder in which blood clots form in the veins (venous thrombosis).  Scarring and thickening (fibrosis) of blood vessels caused by radiation therapy.  A tear in the aorta, the body's main artery (aortic dissection).  Blood vessel problems after illegal drug use, such as use of cocaine.  Tumors in the nervous system (neurofibromatosis).  Certain autoimmune diseases, such as lupus. What increases the risk? The following factors may make you more likely to develop this condition:  Being female.  Being over age 40, especially if you have a history of heart problems.  Smoking.  Having congestive heart failure.  Having an irregular heartbeat (arrhythmia).  Having a history of heart attack or stroke.  Having diabetes.  Having high cholesterol.  Having high blood pressure (hypertension).  Being overweight or obese.  Having kidney disease (renal disease) that requires dialysis. What are the signs or symptoms? Symptoms of this condition include:  Pain or cramps in the abdomen that  develop 15-60 minutes after a meal. This pain may last for 1-3 hours. Some people may develop a fear of eating because of this symptom.  Weight loss.  Diarrhea.  Bloody stool.  Nausea.  Vomiting.  Bloating.  Abdominal pain after stress or with exercise. How is this diagnosed? This condition is diagnosed based on:  Your medical history.  A physical exam.  Tests, such as: ? Ultrasound. ? CT scan. ? Blood tests. ? Urine tests. ? An imaging test that involves injecting a dye into your arteries to show blood flow through blood vessels (angiogram). This can help to show if there are any blockages in the vessels that lead to the intestines. ? Passing a small probe through the mouth and into the stomach to measure the output of carbon dioxide (gastric tonometry). This can help to indicate whether there is decreased blood flow to the stomach and intestines. How is this treated? This condition may be treated with:  Dietary changes such as eating smaller, low-fat, meals more frequently.  Lifestyle changes to treat underlying conditions that contribute to the disease, such as high cholesterol and high blood pressure.  Medicines to reduce blood clotting and increase blood flow.  Surgery to remove the blockage, repair arteries or veins, and restore blood flow. This may involve: ? Angioplasty. This is surgery to widen the affected artery, reduce the blockage, and sometimes insert a small, mesh tube (stent). ? Bypass surgery. This may be done to go around (bypass) the blockage and reconnect healthy arteries or veins. ? Placing a stent in the affected area. This may be done to help keep blocked arteries open. Follow these instructions at home: Eating and drinking  Eat a heart-healthy diet. This includes  fresh fruits and vegetables, whole grains, and lean proteins like chicken, fish, and beans.  Avoid foods that contain a lot of: ? Salt (sodium). ? Sugar. ? Saturated fat (such as red  meat). ? Trans fat (such as in fried foods).  Stay hydrated. Drink enough fluid to keep your urine pale yellow.   Lifestyle  Stay active and get regular exercise as told by your health care provider. Aim for 150 minutes of moderate activity or 75 minutes of vigorous activity a week. Ask your health care provider what activities and forms of exercise are safe for you.  Maintain a healthy weight.  Work with your health care provider to manage your cholesterol.  Manage any other health problems you have, such as high blood pressure, diabetes, or heart rhythm problems.  Do not use any products that contain nicotine or tobacco, such as cigarettes, e-cigarettes, and chewing tobacco. If you need help quitting, ask your health care provider. General instructions  Take over-the-counter and prescription medicines only as told by your health care provider.  Keep all follow-up visits as told by your health care provider. This is important.  You may need to take actions to prevent or treat constipation, such as: ? Drink enough fluid to keep your urine pale yellow. ? Take over-the-counter or prescription medicines. ? Eat foods that are high in fiber, such as beans, whole grains, and fresh fruits and vegetables. ? Limit foods that are high in fat and processed sugars, such as fried or sweet foods. Contact a health care provider if:  Your symptoms do not improve or they return after treatment.  You have a fever.  You are constipated. Get help right away if you:  Have severe abdominal pain.  Have severe chest pain.  Have shortness of breath.  Feel weak or dizzy.  Have fast or irregular heartbeats (palpitations).  Have numbness or weakness in your face, arm, or leg.  Are confused.  Have trouble speaking or people have trouble understanding what you are saying.  Have trouble urinating.  Have blood in your stool.  Have severe nausea, vomiting, or persistent diarrhea. These  symptoms may represent a serious problem that is an emergency. Do not wait to see if the symptoms will go away. Get medical help right away. Call your local emergency services (911 in the [place] Do not drive yourself to the hospital. Summary  Mesenteric ischemia is poor circulation in the vessels that supply blood to the the stomach, intestines, and liver (mesenteric organs).  This condition happens when an artery or vein that provides blood to the mesenteric organs gradually becomes blocked or narrow, restricting the blood supply to the organs.  This condition is commonly caused by fatty deposits that build up in an artery (plaque), which can narrow the artery and restrict blood flow.  You are more likely to develop this condition if you are over age 75 and have a history of heart problems, high blood pressure, diabetes, or high cholesterol.  This condition is usually treated with medicines, dietary and lifestyle changes, and surgery to remove the blockage, repair arteries or veins, and restore blood flow. This information is not intended to replace advice given to you by your health care provider. Make sure you discuss any questions you have with your health care provider. Document Revised: 01/17/2018 Document Reviewed: 01/17/2018 Elsevier Patient Education  2021 [employer].

## 2020-07-01 NOTE — Assessment & Plan Note (Signed)
recent CT angiogram which I have independently reviewed.  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  The most prominent finding is of and near occlusive stenosis and potentially an occlusion by my review of the celiac artery.  The SMA and IMA appear to have good flow without obvious stenosis.  This was not mentioned in the official report, but my review of the scan also may suggest some degree of a median arcuate ligament situation in the celiac artery area. She does have symptoms that are worrisome and likely related to her celiac artery stenosis.  Pain, weight loss, and early satiety with bloating are frequent with chronic mesenteric ischemic symptoms.  She does not have any evidence of acute mesenteric ischemia.  Given these findings and no other clear cause of her symptoms on the CT scan, considering angiogram with possible revascularization is prudent.  We discussed that she did not have significant findings of atherosclerosis and this was felt to be extrinsic compression, consideration for release for median arcuate ligament would be considered and I would refer her for that.

## 2020-07-01 NOTE — Assessment & Plan Note (Signed)
Mild.  Hydrate and limit contrast with angiogram.

## 2020-07-01 NOTE — Assessment & Plan Note (Signed)
lipid control important in reducing the progression of atherosclerotic disease. Continue statin therapy  

## 2020-07-01 NOTE — Progress Notes (Signed)
Patient ID: Cheryl Hall, female   DOB: Apr 15, 1941, 80 y.o.   MRN: [id number]  Chief Complaint  Patient presents with  . Follow-up    Est pt,ref [name] cta done Nov 2021    HPI Cheryl Hall is a 80 y.o. female.  I am asked to see the patient by Dr. Haig Prophet for evaluation of abdominal pain and an abnormal finding on a recent CT angiogram which I have independently reviewed.  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  The most prominent finding is of and near occlusive stenosis and potentially an occlusion by my review of the celiac artery.  The SMA and IMA appear to have good flow without obvious stenosis.  The patient describes abdominal fullness and bloating.  She says she is unable to eat much before her abdomen becomes very bloated.  She does have upper abdominal pain.  She has also had unintentional weight loss.  She has other issues including chronic back pain and some chronic reflux, but this new pain and bloating is different.  This has been going on for several months now.  There is no clear cause or inciting event that started the symptoms.     Past Medical History:  Diagnosis Date  . Anemia   . Chronic back pain   . CKD (chronic kidney disease), stage II   . Coronary artery disease    a. 09/2014 NSTEMI/Cath: moderate 2-vessel disease;  b. 09/2016 NSTEMI/Cath: LM 30, LAD 50p, 57m, LCX nl, RCA 70m, 50d, EF 55-65%.  . Diverticulitis   . Essential hypertension   . GERD (gastroesophageal reflux disease)   . H/O Vertebral Fractures   . History of echocardiogram    a. 09/2016 Echo: EF 55-60%, no rwma, mildly dil Ao root (3.7cm) and Asc Ao (3.9cm), mild to mod TR.  Marland Kitchen History of mania   . History of sciatica   . History of tobacco abuse   . Hyperlipidemia   . Peripheral neuropathy     Past Surgical History:  Procedure Laterality Date  . CARDIAC CATHETERIZATION N/A 10/21/2014   Procedure: Left Heart Cath and Coronary Angiography;   Surgeon: Wellington Hampshire, MD;  Location: [facility];  Service: Cardiovascular;  Laterality: N/A;  . CARDIAC CATHETERIZATION  10/21/2014   Dr. Fletcher Anon  . CHOLECYSTECTOMY    . LEFT HEART CATH AND CORONARY ANGIOGRAPHY N/A 10/09/2016   Procedure: Left Heart Cath and Coronary Angiography;  Surgeon: Nelva Bush, MD;  Location: [facility];  Service: Cardiovascular;  Laterality: N/A;  . LEFT HEART CATH AND CORONARY ANGIOGRAPHY N/A 08/17/2019   Procedure: LEFT HEART CATH AND CORONARY ANGIOGRAPHY;  Surgeon: Wellington Hampshire, MD;  Location: [facility];  Service: Cardiovascular;  Laterality: N/A;     Family History  Problem Relation Age of Onset  . Other Mother        died @ 48.  . Cancer Father        died of pancreatic cancer  . CAD Brother        alive in late 75's w/ h/o stenting.  . Breast cancer Neg Hx      Social History   Tobacco Use  . Smoking status: Former Smoker    Packs/day: 0.25    Years: 50.00    Pack years: 12.50    Types: Cigarettes  . Smokeless tobacco: Never Used  . Tobacco comment: Smoking 2 cigarettes every other day.   Vaping Use  .  Vaping Use: Never used  Substance Use Topics  . Alcohol use: No    Alcohol/week: 0.0 standard drinks  . Drug use: No    No Known Allergies  Current Outpatient Medications  Medication Sig Dispense Refill  . acetaminophen (TYLENOL) 500 MG tablet Take 500 mg by mouth every 6 (six) hours as needed for mild pain or headache.     . alendronate (FOSAMAX) 70 MG tablet Take 70 mg by mouth once a week.   11  . amLODipine (NORVASC) 5 MG tablet Take 1 tablet by mouth once daily 90 tablet 1  . aspirin EC 81 MG EC tablet Take 1 tablet (81 mg total) by mouth daily. 30 tablet 3  . atorvastatin (LIPITOR) 80 MG tablet TAKE 1 TABLET BY MOUTH ONCE DAILY AT  6PM 90 tablet 3  . carvedilol (COREG) 6.25 MG tablet Take 1 tablet by mouth twice daily 180 tablet 3  . Cholecalciferol (VITAMIN D-3 PO) Take 1,000 Units by  mouth daily.     . clopidogrel (PLAVIX) 75 MG tablet Take 1 tablet (75 mg total) by mouth daily with breakfast. 30 tablet 11  . ferrous sulfate 325 (65 FE) MG tablet Take 325 mg by mouth daily.    . hydrochlorothiazide (HYDRODIURIL) 12.5 MG tablet Take 12.5 mg by mouth daily.     . isosorbide mononitrate (IMDUR) 30 MG 24 hr tablet Take 1/2 (one-half) tablet by mouth once daily 45 tablet 1  . lisinopril (ZESTRIL) 40 MG tablet Take 1 tablet by mouth once daily 90 tablet 3  . nitroGLYCERIN (NITROSTAT) 0.4 MG SL tablet Place 0.4 mg under the tongue every 5 (five) minutes as needed for chest pain.    . pantoprazole (PROTONIX) 40 MG tablet Take 1 tablet (40 mg total) by mouth daily. 30 tablet 5  . traMADol-acetaminophen (ULTRACET) 37.5-325 MG tablet Take 0.5 tablets by mouth every 6 (six) hours as needed (pain).   0  . vitamin B-12 (CYANOCOBALAMIN) 1000 MCG tablet Take 1,000 mcg by mouth daily.     No current facility-administered medications for this visit.      REVIEW OF SYSTEMS (Negative unless checked)  Constitutional: [x] Weight loss  [] Fever  [] Chills Cardiac: [] Chest pain   [] Chest pressure   [] Palpitations   [] Shortness of breath when laying flat   [] Shortness of breath at rest   [] Shortness of breath with exertion. Vascular:  [] Pain in legs with walking   [] Pain in legs at rest   [] Pain in legs when laying flat   [] Claudication   [] Pain in feet when walking  [] Pain in feet at rest  [] Pain in feet when laying flat   [] History of DVT   [] Phlebitis   [] Swelling in legs   [] Varicose veins   [] Non-healing ulcers Pulmonary:   [] Uses home oxygen   [] Productive cough   [] Hemoptysis   [] Wheeze  [] COPD   [] Asthma Neurologic:  [] Dizziness  [] Blackouts   [] Seizures   [] History of stroke   [] History of TIA  [] Aphasia   [] Temporary blindness   [] Dysphagia   [] Weakness or numbness in arms   [] Weakness or numbness in legs Musculoskeletal:  [x] Arthritis   [] Joint swelling   [] Joint pain   [x] Low back  pain Hematologic:  [] Easy bruising  [] Easy bleeding   [] Hypercoagulable state   [] Anemic  [] Hepatitis Gastrointestinal:  [] Blood in stool   [] Vomiting blood  [x] Gastroesophageal reflux/heartburn   [x] Abdominal pain Genitourinary:  [] Chronic kidney disease   [] Difficult urination  [] Frequent urination  [] Burning with  urination   [] Hematuria Skin:  [] Rashes   [] Ulcers   [] Wounds Psychological:  [] History of anxiety   []  History of major depression.    Physical Exam BP 135/68 (BP Location: Left Arm)   Pulse 76   Resp 16   Wt 124 lb 6.4 oz (56.4 kg)   BMI 22.75 kg/m  Gen:  WD/WN, NAD. Appears younger than stated age. Head: [place]/AT, No temporalis wasting.  Ear/Nose/Throat: Hearing grossly intact, nares w/o erythema or drainage, oropharynx w/o Erythema/Exudate Eyes: Conjunctiva clear, sclera non-icteric  Neck: trachea midline.  No JVD.  Pulmonary:  Good air movement, respirations not labored, no use of accessory muscles  Cardiac: RRR, no JVD Vascular:  Vessel Right Left  Radial Palpable Palpable                                   Gastrointestinal:. No masses, surgical incisions, or scars.  Mildly tender to palpation in the upper abdomen. Musculoskeletal: M/S 5/5 throughout.  Extremities without ischemic changes.  No deformity or atrophy. No edema. Neurologic: Sensation grossly intact in extremities.  Symmetrical.  Speech is fluent. Motor exam as listed above. Psychiatric: Judgment intact, Mood & affect appropriate for pt's clinical situation. Dermatologic: No rashes or ulcers noted.  No cellulitis or open wounds.    Radiology No results found.  Labs No results found for this or any previous visit (from the past 2160 hour(s)).  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  The most prominent finding is of and near occlusive  stenosis and potentially an occlusion by my review of the celiac artery.  The SMA and IMA appear to have good flow without obvious stenosis.  This was not mentioned in the official report, but my review of the scan also may suggest some degree of a median arcuate ligament situation in the celiac artery area. She does have symptoms that are worrisome and likely related to her celiac artery stenosis.  Pain, weight loss, and early satiety with bloating are frequent with chronic mesenteric ischemic symptoms.  She does not have any evidence of acute mesenteric ischemia.  Given these findings and no other clear cause of her symptoms on the CT scan, considering angiogram with possible revascularization is prudent.  We discussed that she did not have significant findings of atherosclerosis and this was felt to be extrinsic compression, consideration for release for median arcuate ligament would be considered and I would refer her for that.  Essential hypertension blood pressure control important in reducing the progression of atherosclerotic disease. On appropriate oral medications.   CKD (chronic kidney disease), stage II Mild.  Hydrate and limit contrast with angiogram.  Hyperlipidemia lipid control important in reducing the progression of atherosclerotic disease. Continue statin therapy       Leotis Pain 07/01/2020, 10:31 AM   This note was created with Dragon medical transcription system.  Any errors from dictation are unintentional.

## 2020-07-04 ENCOUNTER — Telehealth (INDEPENDENT_AMBULATORY_CARE_PROVIDER_SITE_OTHER): Payer: Self-pay

## 2020-07-04 NOTE — Telephone Encounter (Signed)
Spoke with the patient and she is scheduled with Dr. Lucky Cowboy for:a Celiac angio on 07/21/20 with a 6:45 am arrival to the [facility]. Covid testing on 07/19/20 between 8-1 pm at the [facility]. Pre-procedure instructions were discussed and will be mailed.

## 2020-07-19 ENCOUNTER — Other Ambulatory Visit: Payer: Medicare HMO | Attending: Vascular Surgery

## 2020-07-20 ENCOUNTER — Telehealth (INDEPENDENT_AMBULATORY_CARE_PROVIDER_SITE_OTHER): Payer: Self-pay

## 2020-07-20 ENCOUNTER — Other Ambulatory Visit (INDEPENDENT_AMBULATORY_CARE_PROVIDER_SITE_OTHER): Payer: Self-pay | Admitting: Nurse Practitioner

## 2020-07-20 NOTE — Telephone Encounter (Signed)
From Cheryl Hall:  it doesn't look like this patient went for their covid test  Me:  called no answer and voicemail box not set up

## 2020-07-20 NOTE — Telephone Encounter (Signed)
Patient called back to reschedule her celiac angio that is scheduled for Dr. Lucky Cowboy for 07/21/20 to 07/28/20 with a 6:45 am arrival time to the [facility]. Covid testing on 07/26/20 between 8-1 pm at the [facility]. Pre-procedure instructions will be mailed.

## 2020-07-21 DIAGNOSIS — I774 Celiac artery compression syndrome: Secondary | ICD-10-CM

## 2020-07-26 ENCOUNTER — Other Ambulatory Visit
Admission: RE | Admit: 2020-07-26 | Discharge: 2020-07-26 | Disposition: A | Discharge status: Home / Self Care | Payer: Medicare HMO | Source: Ambulatory Visit | Attending: Vascular Surgery | Admitting: Vascular Surgery

## 2020-07-26 ENCOUNTER — Other Ambulatory Visit: Payer: Self-pay

## 2020-07-26 ENCOUNTER — Telehealth: Payer: Self-pay | Admitting: Cardiovascular Disease

## 2020-07-26 DIAGNOSIS — Z20822 Contact with and (suspected) exposure to covid-19: Secondary | ICD-10-CM | POA: Insufficient documentation

## 2020-07-26 DIAGNOSIS — Z01812 Encounter for preprocedural laboratory examination: Secondary | ICD-10-CM | POA: Insufficient documentation

## 2020-07-26 LAB — SARS CORONAVIRUS 2 (TAT 6-24 HRS): SARS Coronavirus 2: NEGATIVE

## 2020-07-26 MED ORDER — CLOPIDOGREL BISULFATE 75 MG PO TABS: 75.0000 mg | ORAL_TABLET | Freq: Every day | ORAL | 0 refills | Status: DC

## 2020-07-26 NOTE — Telephone Encounter (Signed)
Requested Prescriptions   Signed Prescriptions Disp Refills   clopidogrel (PLAVIX) 75 MG tablet 90 tablet 0    Sig: Take 1 tablet (75 mg total) by mouth daily with breakfast.    Authorizing Provider: [name] A    Ordering User: Raelene Bott, Jadriel Saxer L

## 2020-07-26 NOTE — Telephone Encounter (Signed)
  *  STAT* If patient is at the pharmacy, call can be transferred to refill team.   1. Which medications need to be refilled? (please list name of each medication and dose if known)  plavix 75 mg po q d   2. Which pharmacy/location (including street and city if local pharmacy) is medication to be sent to?  [place]   3. Do they need a 30 day or 90 day supply? [place]

## 2020-07-27 ENCOUNTER — Other Ambulatory Visit (INDEPENDENT_AMBULATORY_CARE_PROVIDER_SITE_OTHER): Payer: Self-pay | Admitting: Nurse Practitioner

## 2020-07-27 DIAGNOSIS — Z862 Personal history of diseases of the blood and blood-forming organs and certain disorders involving the immune mechanism: Secondary | ICD-10-CM | POA: Diagnosis not present

## 2020-07-28 ENCOUNTER — Ambulatory Visit
Admission: RE | Admit: 2020-07-28 | Discharge: 2020-07-28 | Disposition: A | Discharge status: Home / Self Care | Payer: Medicare HMO | Attending: Vascular Surgery | Admitting: Vascular Surgery

## 2020-07-28 ENCOUNTER — Other Ambulatory Visit: Payer: Self-pay

## 2020-07-28 ENCOUNTER — Encounter: Payer: Self-pay | Admitting: Vascular Surgery

## 2020-07-28 ENCOUNTER — Encounter
Admission: RE | Disposition: A | Discharge status: Home / Self Care | Payer: Self-pay | Source: Home / Self Care | Attending: Vascular Surgery

## 2020-07-28 DIAGNOSIS — Z87891 Personal history of nicotine dependence: Secondary | ICD-10-CM | POA: Diagnosis not present

## 2020-07-28 DIAGNOSIS — I771 Stricture of artery: Secondary | ICD-10-CM | POA: Insufficient documentation

## 2020-07-28 DIAGNOSIS — E785 Hyperlipidemia, unspecified: Secondary | ICD-10-CM | POA: Insufficient documentation

## 2020-07-28 DIAGNOSIS — I129 Hypertensive chronic kidney disease with stage 1 through stage 4 chronic kidney disease, or unspecified chronic kidney disease: Secondary | ICD-10-CM | POA: Insufficient documentation

## 2020-07-28 DIAGNOSIS — Z7902 Long term (current) use of antithrombotics/antiplatelets: Secondary | ICD-10-CM | POA: Insufficient documentation

## 2020-07-28 DIAGNOSIS — I774 Celiac artery compression syndrome: Secondary | ICD-10-CM

## 2020-07-28 DIAGNOSIS — K55059 Acute (reversible) ischemia of intestine, part and extent unspecified: Secondary | ICD-10-CM | POA: Diagnosis not present

## 2020-07-28 DIAGNOSIS — Z79899 Other long term (current) drug therapy: Secondary | ICD-10-CM | POA: Diagnosis not present

## 2020-07-28 DIAGNOSIS — N182 Chronic kidney disease, stage 2 (mild): Secondary | ICD-10-CM | POA: Diagnosis not present

## 2020-07-28 DIAGNOSIS — Z7983 Long term (current) use of bisphosphonates: Secondary | ICD-10-CM | POA: Diagnosis not present

## 2020-07-28 DIAGNOSIS — K551 Chronic vascular disorders of intestine: Secondary | ICD-10-CM | POA: Diagnosis not present

## 2020-07-28 DIAGNOSIS — Z7982 Long term (current) use of aspirin: Secondary | ICD-10-CM | POA: Insufficient documentation

## 2020-07-28 HISTORY — PX: VISCERAL ANGIOGRAPHY: CATH118276

## 2020-07-28 HISTORY — DX: Osteopetrosis: Q78.2

## 2020-07-28 LAB — BUN: BUN: 15 mg/dL (ref 8–23)

## 2020-07-28 LAB — CREATININE, SERUM
Creatinine, Ser: 1.2 mg/dL — ABNORMAL HIGH (ref 0.44–1.00)
GFR, Estimated: 47 mL/min — ABNORMAL LOW (ref >60)

## 2020-07-28 SURGERY — VISCERAL ANGIOGRAPHY
Anesthesia: Moderate Sedation

## 2020-07-28 MED ORDER — FAMOTIDINE 20 MG PO TABS: 40.0000 mg | ORAL_TABLET | Freq: Once | ORAL | Status: DC | PRN

## 2020-07-28 MED ORDER — FENTANYL CITRATE (PF) 100 MCG/2ML IJ SOLN
INTRAMUSCULAR | Status: AC
  Administered 2020-07-28: 50 ug
  Filled 2020-07-28: qty 2

## 2020-07-28 MED ORDER — ONDANSETRON HCL 4 MG/2ML IJ SOLN: 4.0000 mg | Freq: Four times a day (QID) | INTRAMUSCULAR | Status: DC | PRN

## 2020-07-28 MED ORDER — IODIXANOL 320 MG/ML IV SOLN
INTRAVENOUS | Status: DC | PRN
  Administered 2020-07-28: 75 mL

## 2020-07-28 MED ORDER — CEFAZOLIN SODIUM-DEXTROSE 2-4 GM/100ML-% IV SOLN
INTRAVENOUS | Status: AC
  Filled 2020-07-28: qty 100

## 2020-07-28 MED ORDER — MIDAZOLAM HCL 2 MG/ML PO SYRP: 8.0000 mg | ORAL_SOLUTION | Freq: Once | ORAL | Status: DC | PRN

## 2020-07-28 MED ORDER — METHYLPREDNISOLONE SODIUM SUCC 125 MG IJ SOLR: 125.0000 mg | Freq: Once | INTRAMUSCULAR | Status: DC | PRN

## 2020-07-28 MED ORDER — CEFAZOLIN SODIUM-DEXTROSE 2-4 GM/100ML-% IV SOLN
2.0000 g | Freq: Once | INTRAVENOUS | Status: AC
  Administered 2020-07-28: 2 g via INTRAVENOUS

## 2020-07-28 MED ORDER — SODIUM CHLORIDE 0.9 % IV SOLN: INTRAVENOUS | Status: DC

## 2020-07-28 MED ORDER — DIPHENHYDRAMINE HCL 50 MG/ML IJ SOLN: 50.0000 mg | Freq: Once | INTRAMUSCULAR | Status: DC | PRN

## 2020-07-28 MED ORDER — HYDROMORPHONE HCL 1 MG/ML IJ SOLN: 1.0000 mg | Freq: Once | INTRAMUSCULAR | Status: DC | PRN

## 2020-07-28 MED ORDER — HEPARIN SODIUM (PORCINE) 1000 UNIT/ML IJ SOLN
INTRAMUSCULAR | Status: AC
  Administered 2020-07-28: 4000 [IU]
  Filled 2020-07-28: qty 1

## 2020-07-28 MED ORDER — MIDAZOLAM HCL 5 MG/5ML IJ SOLN
INTRAMUSCULAR | Status: AC
  Administered 2020-07-28: 2 mg
  Filled 2020-07-28: qty 5

## 2020-07-28 SURGICAL SUPPLY — 21 items
CATH ANGIO 5F 80CM MHK1-H (CATHETERS) ×2 IMPLANT
CATH ANGIO 5F PIGTAIL 65CM (CATHETERS) ×2 IMPLANT
CATH BEACON 5 .035 65 C2 TIP (CATHETERS) ×2 IMPLANT
CATH BEACON 5 .035 65 KMP TIP (CATHETERS) ×2 IMPLANT
CATH TEMPO 5F RIM 65CM (CATHETERS) ×2 IMPLANT
CATH VS1 5X80 (CATHETERS) ×2 IMPLANT
CATH VS15FR (CATHETERS) ×2 IMPLANT
DEVICE STARCLOSE SE CLOSURE (Vascular Products) ×2 IMPLANT
DEVICE TORQUE (MISCELLANEOUS) ×2 IMPLANT
DEVICE TORQUE .014-.018 (MISCELLANEOUS) ×1 IMPLANT
GLIDECATH 4FR STR (CATHETERS) ×2 IMPLANT
GLIDEWIRE STIFF .35X180X3 HYDR (WIRE) ×2 IMPLANT
GUIDEWIRE ADV .018X180CM (WIRE) ×2 IMPLANT
GUIDEWIRE ANGLED .035 180CM (WIRE) ×2 IMPLANT
KIT ENCORE 26 ADVANTAGE (KITS) IMPLANT
PACK ANGIOGRAPHY (CUSTOM PROCEDURE TRAY) ×2 IMPLANT
SHEATH BRITE TIP 5FRX11 (SHEATH) ×2 IMPLANT
SYR MEDRAD MARK 7 150ML (SYRINGE) ×2 IMPLANT
TORQUE DEVICE .014-.018 (MISCELLANEOUS) ×2
TUBING CONTRAST HIGH PRESS 72 (TUBING) ×2 IMPLANT
WIRE GUIDERIGHT .035X150 (WIRE) ×2 IMPLANT

## 2020-07-28 NOTE — Op Note (Signed)
[facility] Percutaneous Study/Intervention Procedural Note   Date: 07/28/2020  Surgeon(s): Leotis Pain, MD  Assistants: none  Pre-operative Diagnosis: 1.  Chronic mesenteric ischemia 2.  CT scan suggesting celiac artery stenosis   Post-operative diagnosis: Same  Procedure(s) Performed: 1. Ultrasound guidance for vascular access right femoral artery  2. Catheter placement into SMA and celiac artery from right femoral approach 3. Aortogram and selective angiogram of the SMA and celiac artery 4.  StarClose closure device right femoral artery  Contrast: 75 cc  Fluoro time: 26.1 minutes  EBL: 5 cc  Anesthesia: Approximately 59 minutes of Moderate conscious sedation using 2 mg of Versed and 50 mcg of Fentanyl  Indications: Patient is a 80 y.o. female who has symptoms consistent with mesenteric ischemia. The patient has a CT scan showing significant aortoiliac calcification with calcification of the visceral vessels and what appeared to be a high-grade stenosis of the celiac artery. The patient is brought in for angiography for further evaluation and potential treatment. Risks and benefits are discussed and informed consent is obtained  Procedure: The patient was identified and appropriate procedural time out was performed. The patient was then placed supine on the table and prepped and draped in the usual sterile fashion. Moderate conscious sedation was administered during a face to face encounter with the patient throughout the procedure with my supervision of the RN administering medicines and monitoring the patient's vital signs, pulse oximetry, telemetry and mental status throughout from the start of the procedure until the patient was taken to the recovery room. Ultrasound was used to evaluate the right common femoral artery. It was patent . A digital ultrasound image was acquired. A Seldinger  needle was used to access the right common femoral artery under direct ultrasound guidance and a permanent image was performed. A 0.035 J wire was advanced without resistance and a 5Fr sheath was placed. Pigtail catheter was placed into the aorta and an AP aortogram was performed. This demonstrated a highly tortuous aorta and iliac arteries.  The renal arteries appeared to be patent.  Flow could be seen in the celiac and SMA although the origins cannot be seen in the AP projection. We transitioned to the lateral projection to image the celiac and SMA. The lateral image demonstrated what appeared to be a relatively high-grade stenosis of the celiac artery at its origin although it was not particularly well opacified on the aortogram.  The SMA was calcific but did not appear highly stenotic on the aortogram. The patient was given 4000 units of IV heparin.  Initially, a V S1 catheter was used and the SMA was easily cannulated and selective imaging showed minimal disease of the SMA without any significant stenosis.  The V S1 catheter was used to cannulate the celiac artery and a high-grade stenosis in the 90% range was confirmed, and this was highly calcific.  Due to her extremely tortuous aorta and iliac arteries, the catheter was very difficult to manipulate and with the tight calcific lesion I could get a wire across the lesion but could never get catheter support in the celiac artery.  Multiple catheters were tried including a V S1, C2, REM, Kumpe, and a glide catheter but I was never able to get stable catheter position to perform intervention on the celiac artery.  We used over 25 minutes of fluoroscopy and it was clear this endeavor was not going to be successful from the right femoral approach.  I thought bringing the patient back and  considering an arm approach may be a better option at a later date.  At this point, I elected to terminate the procedure. The diagnostic catheter was removed. StarClose closure  device was deployed in usual fashion with excellent hemostatic result. The patient was taken to the recovery room in stable condition having tolerated the procedure well.     Findings:High-grade stenosis of the celiac artery, minimal disease in the SMA.  Aorta and iliac arteries were extremely tortuous.  Disposition: Patient was taken to the recovery room in stable condition having tolerated the procedure well.  Complications: None  Leotis Pain 07/28/2020 9:24 AM   This note was created with Dragon Medical transcription system. Any errors in dictation are purely unintentional.

## 2020-07-28 NOTE — Interval H&P Note (Signed)
History and Physical Interval Note:  07/28/2020 8:03 AM  Cheryl Hall  has presented today for surgery, with the diagnosis of Celiac angio   Celiac artery stenosis Covid  Feb 21.  The various methods of treatment have been discussed with the patient and family. After consideration of risks, benefits and other options for treatment, the patient has consented to  Procedure(s): VISCERAL ANGIOGRAPHY (N/A) as a surgical intervention.  The patient's history has been reviewed, patient examined, no change in status, stable for surgery.  I have reviewed the patient's chart and labs.  Questions were answered to the patient's satisfaction.     Leotis Pain

## 2020-07-28 NOTE — Discharge Instructions (Signed)

## 2020-08-19 ENCOUNTER — Encounter (INDEPENDENT_AMBULATORY_CARE_PROVIDER_SITE_OTHER): Payer: Self-pay | Admitting: Vascular Surgery

## 2020-08-19 ENCOUNTER — Other Ambulatory Visit: Payer: Self-pay

## 2020-08-19 ENCOUNTER — Ambulatory Visit (INDEPENDENT_AMBULATORY_CARE_PROVIDER_SITE_OTHER): Payer: Medicare HMO | Admitting: Vascular Surgery

## 2020-08-19 VITALS — BP 150/87 | HR 65 | Ht 62.0 in | Wt 123.0 lb

## 2020-08-19 DIAGNOSIS — F419 Anxiety disorder, unspecified: Secondary | ICD-10-CM | POA: Insufficient documentation

## 2020-08-19 DIAGNOSIS — N182 Chronic kidney disease, stage 2 (mild): Secondary | ICD-10-CM

## 2020-08-19 DIAGNOSIS — I774 Celiac artery compression syndrome: Secondary | ICD-10-CM | POA: Diagnosis not present

## 2020-08-19 DIAGNOSIS — M810 Age-related osteoporosis without current pathological fracture: Secondary | ICD-10-CM | POA: Insufficient documentation

## 2020-08-19 DIAGNOSIS — I771 Stricture of artery: Secondary | ICD-10-CM

## 2020-08-19 DIAGNOSIS — E782 Mixed hyperlipidemia: Secondary | ICD-10-CM

## 2020-08-19 DIAGNOSIS — I10 Essential (primary) hypertension: Secondary | ICD-10-CM | POA: Diagnosis not present

## 2020-08-19 DIAGNOSIS — K5790 Diverticulosis of intestine, part unspecified, without perforation or abscess without bleeding: Secondary | ICD-10-CM | POA: Insufficient documentation

## 2020-08-19 NOTE — Assessment & Plan Note (Signed)
We discussed that approach from a radial access or brachial access may have more success at getting to the angle of the celiac artery.  It is certainly worth giving it a try from this approach given her significant symptomatic status.  This would still be far less morbid than an open surgical approach which would be more morbid that is likely of benefit for her symptoms.  Risks and benefits were discussed and she is agreeable to proceed.

## 2020-08-19 NOTE — Progress Notes (Signed)
MRN : [id number]  Cheryl Hall is a 80 y.o. ([birth year]) female who presents with chief complaint of  Chief Complaint  Patient presents with  . Follow-up    4 wk  [facility] post Visceral angio  .  History of Present Illness: Patient returns today in follow up of her chronic visceral symptoms.  She underwent an angiogram a few weeks ago from a femoral approach to cross the celiac artery lesion and treated from the femoral approach.  She had no periprocedural complications.  Her access site is well-healed.  She still has a lot of bloating and postprandial discomfort.  This bothers her most every day.  We discussed that at times the mesenteric vessels can be difficult to approach from a femoral approach, and consideration from an arm approach would be reasonable.  She is familiar with this as she has had multiple cardiac catheterizations from arm approaches.  Current Outpatient Medications  Medication Sig Dispense Refill  . acetaminophen (TYLENOL) 500 MG tablet Take 500 mg by mouth every 6 (six) hours as needed for mild pain or headache.     . alendronate (FOSAMAX) 70 MG tablet Take 70 mg by mouth once a week.  11  . amLODipine (NORVASC) 5 MG tablet Take 1 tablet by mouth once daily 90 tablet 1  . aspirin EC 81 MG EC tablet Take 1 tablet (81 mg total) by mouth daily. 30 tablet 3  . atorvastatin (LIPITOR) 80 MG tablet TAKE 1 TABLET BY MOUTH ONCE DAILY AT  6PM 90 tablet 3  . carvedilol (COREG) 6.25 MG tablet Take 1 tablet by mouth twice daily 180 tablet 3  . Cholecalciferol (VITAMIN D-3 PO) Take 1,000 Units by mouth daily.     . clopidogrel (PLAVIX) 75 MG tablet Take 1 tablet (75 mg total) by mouth daily with breakfast. 90 tablet 0  . ferrous sulfate 325 (65 FE) MG tablet Take 325 mg by mouth daily.    . hydrochlorothiazide (HYDRODIURIL) 12.5 MG tablet Take 12.5 mg by mouth daily.     . isosorbide mononitrate (IMDUR) 30 MG 24 hr tablet Take 1/2 (one-half) tablet by mouth once daily 45 tablet 1  .  lisinopril (ZESTRIL) 40 MG tablet Take 1 tablet by mouth once daily 90 tablet 3  . nitroGLYCERIN (NITROSTAT) 0.4 MG SL tablet Place 0.4 mg under the tongue every 5 (five) minutes as needed for chest pain.    . pantoprazole (PROTONIX) 40 MG tablet Take 1 tablet (40 mg total) by mouth daily. 30 tablet 5  . traMADol-acetaminophen (ULTRACET) 37.5-325 MG tablet Take 0.5 tablets by mouth every 6 (six) hours as needed (pain).   0  . vitamin B-12 (CYANOCOBALAMIN) 1000 MCG tablet Take 1,000 mcg by mouth daily.    . predniSONE (DELTASONE) 10 MG tablet Take by mouth.     No current facility-administered medications for this visit.    Past Medical History:  Diagnosis Date  . Anemia   . Chronic back pain   . CKD (chronic kidney disease), stage II   . Coronary artery disease    a. 09/2014 NSTEMI/Cath: moderate 2-vessel disease;  b. 09/2016 NSTEMI/Cath: LM 30, LAD 50p, 20m, LCX nl, RCA 52m, 50d, EF 55-65%.  . Diverticulitis   . Essential hypertension   . GERD (gastroesophageal reflux disease)   . H/O Vertebral Fractures   . History of echocardiogram    a. 09/2016 Echo: EF 55-60%, no rwma, mildly dil Ao root (3.7cm) and Asc Ao (3.9cm), mild to  mod TR.  Marland Kitchen History of mania   . History of sciatica   . History of tobacco abuse   . Hyperlipidemia   . Osteopetrosis   . Peripheral neuropathy     Past Surgical History:  Procedure Laterality Date  . CARDIAC CATHETERIZATION N/A 10/21/2014   Procedure: Left Heart Cath and Coronary Angiography;  Surgeon: Wellington Hampshire, MD;  Location: [place] [facility];  Service: Cardiovascular;  Laterality: N/A;  . CARDIAC CATHETERIZATION  10/21/2014   Dr. Fletcher Anon  . CHOLECYSTECTOMY    . LEFT HEART CATH AND CORONARY ANGIOGRAPHY N/A 10/09/2016   Procedure: Left Heart Cath and Coronary Angiography;  Surgeon: Nelva Bush, MD;  Location: [place] [facility];  Service: Cardiovascular;  Laterality: N/A;  . LEFT HEART CATH AND CORONARY ANGIOGRAPHY N/A 08/17/2019    Procedure: LEFT HEART CATH AND CORONARY ANGIOGRAPHY;  Surgeon: Wellington Hampshire, MD;  Location: [place] [facility];  Service: Cardiovascular;  Laterality: N/A;  . VISCERAL ANGIOGRAPHY N/A 07/28/2020   Procedure: VISCERAL ANGIOGRAPHY;  Surgeon: Algernon Huxley, MD;  Location: [place] [facility];  Service: Cardiovascular;  Laterality: N/A;     Social History   Tobacco Use  . Smoking status: Current Some Day Smoker    Packs/day: 0.25    Years: 50.00    Pack years: 12.50    Types: Cigarettes  . Smokeless tobacco: Never Used  . Tobacco comment: Smoking 2 cigarettes every other day.   Vaping Use  . Vaping Use: Never used  Substance Use Topics  . Alcohol use: No    Alcohol/week: 0.0 standard drinks  . Drug use: No       Family History  Problem Relation Age of Onset  . Other Mother        died @ 41.  . Cancer Father        died of pancreatic cancer  . CAD Brother        alive in late 80's w/ h/o stenting.  . Breast cancer Neg Hx      No Known Allergies  REVIEW OF SYSTEMS (Negative unless checked)  Constitutional: [x] ?Weight loss  [] ?Fever  [] ?Chills Cardiac: [] ?Chest pain   [] ?Chest pressure   [] ?Palpitations   [] ?Shortness of breath when laying flat   [] ?Shortness of breath at rest   [] ?Shortness of breath with exertion. Vascular:  [] ?Pain in legs with walking   [] ?Pain in legs at rest   [] ?Pain in legs when laying flat   [] ?Claudication   [] ?Pain in feet when walking  [] ?Pain in feet at rest  [] ?Pain in feet when laying flat   [] ?History of DVT   [] ?Phlebitis   [] ?Swelling in legs   [] ?Varicose veins   [] ?Non-healing ulcers Pulmonary:   [] ?Uses home oxygen   [] ?Productive cough   [] ?Hemoptysis   [] ?Wheeze  [] ?COPD   [] ?Asthma Neurologic:  [] ?Dizziness  [] ?Blackouts   [] ?Seizures   [] ?History of stroke   [] ?History of TIA  [] ?Aphasia   [] ?Temporary blindness   [] ?Dysphagia   [] ?Weakness or numbness in arms   [] ?Weakness or numbness in legs Musculoskeletal:  [x] ?Arthritis    [] ?Joint swelling   [] ?Joint pain   [x] ?Low back pain Hematologic:  [] ?Easy bruising  [] ?Easy bleeding   [] ?Hypercoagulable state   [] ?Anemic  [] ?Hepatitis Gastrointestinal:  [] ?Blood in stool   [] ?Vomiting blood  [x] ?Gastroesophageal reflux/heartburn   [x] ?Abdominal pain Genitourinary:  [] ?Chronic kidney disease   [] ?Difficult urination  [] ?Frequent urination  [] ?Burning with urination   [] ?Hematuria Skin:  [] ?  Rashes   [] ?Ulcers   [] ?Wounds Psychological:  [] ?History of anxiety   [] ? History of major depression.  Physical Examination  BP (!) 150/87   Pulse 65   Ht 5\' 2"  (1.575 m)   Wt 123 lb (55.8 kg)   BMI 22.50 kg/m  Gen:  WD/WN, NAD.  Appears younger than stated age Head: [place]/AT, No temporalis wasting. Ear/Nose/Throat: Hearing grossly intact, nares w/o erythema or drainage Eyes: Conjunctiva clear. Sclera non-icteric Neck: Supple.  Trachea midline Pulmonary:  Good air movement, no use of accessory muscles.  Cardiac: RRR, no JVD Vascular:  Vessel Right Left  Radial Palpable Palpable                          PT Palpable Palpable  DP Palpable Palpable   Gastrointestinal: soft, mildly tender in the upper abdomen. Musculoskeletal: M/S 5/5 throughout.  No deformity or atrophy.  No edema. Neurologic: Sensation grossly intact in extremities.  Symmetrical.  Speech is fluent.  Psychiatric: Judgment intact, Mood & affect appropriate for pt's clinical situation. Dermatologic: No rashes or ulcers noted.  No cellulitis or open wounds.  Access site is well-healed       Labs Recent Results (from the past 2160 hour(s))  SARS CORONAVIRUS 2 (TAT 6-24 HRS) Nasopharyngeal Nasopharyngeal Swab     Status: None   Collection Time: 07/26/20  8:54 AM   Specimen: Nasopharyngeal Swab  Result Value Ref Range   SARS Coronavirus 2 NEGATIVE NEGATIVE    Comment: (NOTE) SARS-CoV-2 target nucleic acids are NOT DETECTED.  The SARS-CoV-2 RNA is generally detectable in upper and lower respiratory  specimens during the acute phase of infection. Negative results do not preclude SARS-CoV-2 infection, do not rule out co-infections with other pathogens, and should not be used as the sole basis for treatment or other patient management decisions. Negative results must be combined with clinical observations, patient history, and epidemiological information. The expected result is Negative.  Fact Sheet for Patients: SugarRoll.be  Fact Sheet for Healthcare Providers: https://www.woods-mathews.com/  This test is not yet approved or cleared by the [place] FDA and  has been authorized for detection and/or diagnosis of SARS-CoV-2 by FDA under an Emergency Use Authorization (EUA). This EUA will remain  in effect (meaning this test can be used) for the duration of the COVID-19 declaration under Se ction 564(b)(1) of the Act, 21 U.S.C. section 360bbb-3(b)(1), unless the authorization is terminated or revoked sooner.  Performed at [facility], [place] [address] [place], [place] [address]   BUN     Status: None   Collection Time: 07/28/20  7:25 AM  Result Value Ref Range   BUN 15 8 - 23 mg/dL    Comment: Performed at [facility], [address] [place], [place] [address]  Creatinine, serum     Status: Abnormal   Collection Time: 07/28/20  7:25 AM  Result Value Ref Range   Creatinine, Ser 1.20 (H) 0.44 - 1.00 mg/dL   GFR, Estimated 47 (L) >60 mL/min    Comment: (NOTE) Calculated using the CKD-EPI Creatinine Equation (2021) Performed at [facility], [address] [place], [place] [address]     Radiology PERIPHERAL VASCULAR CATHETERIZATION  Result Date: 07/28/2020 See op note   Assessment/Plan Essential hypertension blood pressure control important in reducing the progression of atherosclerotic disease. On appropriate oral medications.   CKD (chronic kidney disease), stage II Mild.  Hydrate and limit  contrast with angiogram.  Hyperlipidemia lipid control important in reducing the progression of atherosclerotic disease. Continue statin therapy  Celiac artery stenosis (HCC) We discussed that approach from a radial access or brachial access may have more success at getting to the angle of the celiac artery.  It is certainly worth giving it a try from this approach given her significant symptomatic status.  This would still be far less morbid than an open surgical approach which would be more morbid that is likely of benefit for her symptoms.  Risks and benefits were discussed and she is agreeable to proceed.    Leotis Pain, MD  08/19/2020 1:38 PM    This note was created with Dragon medical transcription system.  Any errors from dictation are purely unintentional

## 2020-08-30 ENCOUNTER — Telehealth (INDEPENDENT_AMBULATORY_CARE_PROVIDER_SITE_OTHER): Payer: Self-pay

## 2020-08-30 NOTE — Telephone Encounter (Signed)
I attempted to contact the patient to schedule her for a celiac angio with Dr. Lucky Cowboy. Patient's voicemail box is not set up so I was unable to leave a message.

## 2020-09-08 NOTE — Telephone Encounter (Signed)
I attempted to contact the patient to schedule her for a celiac angio stent and her voicemail box is not set up so I was unable to leave a message.

## 2020-09-11 ENCOUNTER — Other Ambulatory Visit: Payer: Self-pay | Admitting: Cardiovascular Disease

## 2020-09-20 ENCOUNTER — Telehealth (INDEPENDENT_AMBULATORY_CARE_PROVIDER_SITE_OTHER): Payer: Self-pay

## 2020-09-20 NOTE — Telephone Encounter (Signed)
Spoke with the patient and she is scheduled with Dr. Lucky Cowboy for a celiac angio stent placement on 09/29/20 with a 6:45 am arrival time to the MM. Covid testing on 09/27/20 between 8-2 pm at the [place]. Pre-procedure instructions were discussed and will be mailed.

## 2020-09-27 ENCOUNTER — Other Ambulatory Visit
Admission: RE | Admit: 2020-09-27 | Discharge: 2020-09-27 | Disposition: A | Discharge status: Home / Self Care | Payer: Medicare HMO | Source: Ambulatory Visit | Attending: Vascular Surgery | Admitting: Vascular Surgery

## 2020-09-27 ENCOUNTER — Other Ambulatory Visit: Payer: Self-pay

## 2020-09-27 DIAGNOSIS — Z01812 Encounter for preprocedural laboratory examination: Secondary | ICD-10-CM | POA: Diagnosis not present

## 2020-09-27 DIAGNOSIS — Z20822 Contact with and (suspected) exposure to covid-19: Secondary | ICD-10-CM | POA: Insufficient documentation

## 2020-09-27 LAB — SARS CORONAVIRUS 2 (TAT 6-24 HRS): SARS Coronavirus 2: NEGATIVE

## 2020-09-29 ENCOUNTER — Encounter: Payer: Self-pay | Admitting: Vascular Surgery

## 2020-09-29 ENCOUNTER — Other Ambulatory Visit (INDEPENDENT_AMBULATORY_CARE_PROVIDER_SITE_OTHER): Payer: Self-pay | Admitting: Nurse Practitioner

## 2020-09-29 ENCOUNTER — Ambulatory Visit
Admission: RE | Admit: 2020-09-29 | Discharge: 2020-09-29 | Disposition: A | Discharge status: Home / Self Care | Payer: Medicare HMO | Attending: Vascular Surgery | Admitting: Vascular Surgery

## 2020-09-29 ENCOUNTER — Other Ambulatory Visit: Payer: Self-pay

## 2020-09-29 ENCOUNTER — Encounter
Admission: RE | Disposition: A | Discharge status: Home / Self Care | Payer: Self-pay | Source: Home / Self Care | Attending: Vascular Surgery

## 2020-09-29 DIAGNOSIS — I774 Celiac artery compression syndrome: Secondary | ICD-10-CM

## 2020-09-29 DIAGNOSIS — K55059 Acute (reversible) ischemia of intestine, part and extent unspecified: Secondary | ICD-10-CM | POA: Diagnosis not present

## 2020-09-29 DIAGNOSIS — N182 Chronic kidney disease, stage 2 (mild): Secondary | ICD-10-CM | POA: Insufficient documentation

## 2020-09-29 DIAGNOSIS — K551 Chronic vascular disorders of intestine: Secondary | ICD-10-CM | POA: Diagnosis not present

## 2020-09-29 DIAGNOSIS — F17210 Nicotine dependence, cigarettes, uncomplicated: Secondary | ICD-10-CM | POA: Diagnosis not present

## 2020-09-29 DIAGNOSIS — I771 Stricture of artery: Secondary | ICD-10-CM | POA: Diagnosis not present

## 2020-09-29 DIAGNOSIS — I129 Hypertensive chronic kidney disease with stage 1 through stage 4 chronic kidney disease, or unspecified chronic kidney disease: Secondary | ICD-10-CM | POA: Insufficient documentation

## 2020-09-29 DIAGNOSIS — E785 Hyperlipidemia, unspecified: Secondary | ICD-10-CM | POA: Diagnosis not present

## 2020-09-29 HISTORY — PX: VISCERAL ANGIOGRAPHY: CATH118276

## 2020-09-29 LAB — CREATININE, SERUM
Creatinine, Ser: 1.3 mg/dL — ABNORMAL HIGH (ref 0.44–1.00)
GFR, Estimated: 42 mL/min — ABNORMAL LOW (ref >60)

## 2020-09-29 LAB — BUN: BUN: 17 mg/dL (ref 8–23)

## 2020-09-29 SURGERY — VISCERAL ANGIOGRAPHY
Anesthesia: Moderate Sedation

## 2020-09-29 MED ORDER — HYDROMORPHONE HCL 1 MG/ML IJ SOLN
INTRAMUSCULAR | Status: AC
  Administered 2020-09-29: 0.5 mg via INTRAVENOUS
  Filled 2020-09-29: qty 0.5

## 2020-09-29 MED ORDER — FAMOTIDINE 20 MG PO TABS: 40.0000 mg | ORAL_TABLET | Freq: Once | ORAL | Status: DC | PRN

## 2020-09-29 MED ORDER — DIPHENHYDRAMINE HCL 50 MG/ML IJ SOLN: 50.0000 mg | Freq: Once | INTRAMUSCULAR | Status: DC | PRN

## 2020-09-29 MED ORDER — HEPARIN SODIUM (PORCINE) 1000 UNIT/ML IJ SOLN
INTRAMUSCULAR | Status: AC
  Filled 2020-09-29: qty 1

## 2020-09-29 MED ORDER — HYDROMORPHONE HCL 1 MG/ML IJ SOLN
1.0000 mg | Freq: Once | INTRAMUSCULAR | Status: AC | PRN
Start: 2020-09-29 — End: 2020-09-29

## 2020-09-29 MED ORDER — METHYLPREDNISOLONE SODIUM SUCC 125 MG IJ SOLR: 125.0000 mg | Freq: Once | INTRAMUSCULAR | Status: DC | PRN

## 2020-09-29 MED ORDER — CEFAZOLIN SODIUM-DEXTROSE 2-4 GM/100ML-% IV SOLN
INTRAVENOUS | Status: AC
  Administered 2020-09-29: 2 g via INTRAVENOUS
  Filled 2020-09-29: qty 100

## 2020-09-29 MED ORDER — MIDAZOLAM HCL 2 MG/2ML IJ SOLN
INTRAMUSCULAR | Status: DC | PRN
  Administered 2020-09-29 (×2): 1 mg via INTRAVENOUS

## 2020-09-29 MED ORDER — MIDAZOLAM HCL 5 MG/5ML IJ SOLN
INTRAMUSCULAR | Status: AC
  Filled 2020-09-29: qty 5

## 2020-09-29 MED ORDER — HEPARIN SODIUM (PORCINE) 1000 UNIT/ML IJ SOLN
INTRAMUSCULAR | Status: DC | PRN
  Administered 2020-09-29: 4000 [IU] via INTRAVENOUS

## 2020-09-29 MED ORDER — ONDANSETRON HCL 4 MG/2ML IJ SOLN: 4.0000 mg | Freq: Four times a day (QID) | INTRAMUSCULAR | Status: DC | PRN

## 2020-09-29 MED ORDER — FENTANYL CITRATE (PF) 100 MCG/2ML IJ SOLN
INTRAMUSCULAR | Status: AC
  Filled 2020-09-29: qty 2

## 2020-09-29 MED ORDER — CEFAZOLIN SODIUM-DEXTROSE 2-4 GM/100ML-% IV SOLN
INTRAVENOUS | Status: AC
  Filled 2020-09-29: qty 100

## 2020-09-29 MED ORDER — FENTANYL CITRATE (PF) 100 MCG/2ML IJ SOLN
INTRAMUSCULAR | Status: DC | PRN
  Administered 2020-09-29: 25 ug via INTRAVENOUS
  Administered 2020-09-29: 50 ug via INTRAVENOUS

## 2020-09-29 MED ORDER — CEFAZOLIN SODIUM-DEXTROSE 2-4 GM/100ML-% IV SOLN: 2.0000 g | Freq: Once | INTRAVENOUS | Status: AC

## 2020-09-29 MED ORDER — MIDAZOLAM HCL 2 MG/ML PO SYRP: 8.0000 mg | ORAL_SOLUTION | Freq: Once | ORAL | Status: DC | PRN

## 2020-09-29 MED ORDER — SODIUM CHLORIDE 0.9 % IV SOLN: INTRAVENOUS | Status: DC

## 2020-09-29 SURGICAL SUPPLY — 18 items
BALLN LUTONIX DCB 5X40X130 (BALLOONS) ×2
BALLN LUTONIX DCB 6X40X130 (BALLOONS) ×2
BALLOON LUTONIX DCB 5X40X130 (BALLOONS) ×1 IMPLANT
BALLOON LUTONIX DCB 6X40X130 (BALLOONS) ×1 IMPLANT
CANNULA 5F STIFF (CANNULA) ×2 IMPLANT
CATH ANGIO 5F 100CM .035 PIG (CATHETERS) ×2 IMPLANT
CATH VERT 5FR 125CM (CATHETERS) ×2 IMPLANT
COVER PROBE U/S 5X48 (MISCELLANEOUS) ×2 IMPLANT
DRAPE BRACHIAL (DRAPES) ×4 IMPLANT
GLIDEWIRE ADV .035X260CM (WIRE) ×2 IMPLANT
KIT ENCORE 26 ADVANTAGE (KITS) ×2 IMPLANT
PACK ANGIOGRAPHY (CUSTOM PROCEDURE TRAY) ×2 IMPLANT
SHEATH BRITE TIP 5FRX11 (SHEATH) ×2 IMPLANT
SHEATH BRITE TIP 6FRX11 (SHEATH) ×2 IMPLANT
SHEATH SHUTTLE SELECT 6F (SHEATH) ×2 IMPLANT
TUBING CONTRAST HIGH PRESS 72 (TUBING) ×2 IMPLANT
VALVE CHECKFLO PERFORMER (SHEATH) ×2 IMPLANT
WIRE GUIDERIGHT .035X150 (WIRE) ×2 IMPLANT

## 2020-09-29 NOTE — Op Note (Signed)
[facility] Percutaneous Study/Intervention Procedural Note   Date: 09/29/2020  Surgeon(s): Leotis Pain, MD  Assistants: none  Pre-operative Diagnosis: 1.  Chronic Mesenteric ischemia 2. Celiac artery stenosis   Post-operative diagnosis: Same  Procedure(s) Performed: 1. Ultrasound guidance for vascular access left radial 2. Catheter placement into celiac artery from left radial approach 3. Aortogram and selective angiogram of the celiac artery 4. PTA of the celiac artery with 5 and 6 mm diameter Lutonix drug-coated angioplasty balloon   Contrast: 60  Fluoro time: 6.3  EBL: 10 cc  Anesthesia: Approximately 48 minutes of Moderate conscious sedation using 2 mg of Versed and 75 mcg of Fentanyl  Indications: Patient is a 80 y.o. female who has symptoms consistent with mesenteric ischemia. The patient has a duplex and previous angiogram showing celiac artery stenosis. The patient is brought in for angiography for further evaluation and potential treatment. Risks and benefits are discussed and informed consent is obtained  Procedure: The patient was identified and appropriate procedural time out was performed. The patient was then placed supine on the table and prepped and draped in the usual sterile fashion. Moderate conscious sedation was administered during a face to face encounter with the patient throughout the procedure with my supervision of the RN administering medicines and monitoring the patient's vital signs, pulse oximetry, telemetry and mental status throughout from the start of the procedure until the patient was taken to the recovery room. Ultrasound was used to evaluate the left radial artery. It was patent . A digital ultrasound image was acquired. A micropuncture needle was used to access the left radial artery under direct ultrasound guidance and a permanent image was  performed. A micropuncture wire and sheath were then placed.  A 0.035 J wire was advanced without resistance and a 5Fr sheath was placed. I then got to the LAO projection to navigate into the descending thoracic aorta with the pigtail catheter and an advantage wire.  Pigtail catheter was placed into the aorta and an oblique aortogram was performed. This demonstrated good flow in the renal arteries without obvious stenosis and an SMA which was widely patent.  The celiac artery was not well seen on the initial projection, and with the arm out we could not get all the way to the lateral projection.  We went to a steep LAO projection with some cranial that showed the origins of the SMA and celiac artery better.  I used a Kumpe catheter and an advantage wire to cannulate the celiac artery.  There did appear to be about an 80% stenosis of the celiac artery.  It did not appear quite as tight as it did in the lateral projection on femoral approach, but still had a significant lesion.  This also showed the trifurcation of the celiac artery reasonably well.  The patient was given 4000 units of IV heparin.We upsized to a 6 Fr sheath over a versa core wire parked well out the celiac branches after removing the Kumpe catheter. Based on her symptoms and these findings, I elected to treat the celiac artery to try to improve the patient's clinical course. I then used a 5 mm diameter x 4 mm length Lutonix drug-coated angioplasty balloon to perform percutaneous transluminal angioplasty of the celiac artery. I inflated the balloon to 12 atm and held the inflation for 1 minute.  This was slightly undersized, so upsized to a 6 mm diameter by 4 cm length Lutonix drug-coated angioplasty balloon inflated to 10 atm for 1  minute.  On completion angiogram following this angioplasty, 15-20% residual stenosis was identified. At this point, I elected to terminate the procedure with good response to angioplasty and the difficulties of placing  a stent in this location avoiding the trifurcation vessels. The diagnostic catheter was removed. StarClose closure device was deployed in usual fashion with excellent hemostatic result. The patient was taken to the recovery room in stable condition having tolerated the procedure well.     Findings:Celiac artery with approximately 80% stenosis improved with angioplasty.  Disposition: Patient was taken to the recovery room in stable condition having tolerated the procedure well.  Complications: None  Leotis Pain 09/29/2020 9:26 AM   This note was created with Dragon Medical transcription system. Any errors in dictation are purely unintentional.

## 2020-09-29 NOTE — Progress Notes (Signed)
Pt. Med. With Dilaudid 0.5 mg slow IVP secondary to pain 8-9/10 per pt. And hematoma extension about 1 " above TR Band site. Darnelle Maffucci [place] , [place]. Tech. Assessed site and Dr. Lucky Cowboy made aware .

## 2020-09-29 NOTE — H&P (Signed)
[facility] Admission History & Physical  MRN : [id number]  Cheryl Hall is a 80 y.o. (Jul 06, 1940) female who presents with chief complaint of No chief complaint on file. Marland Kitchen  History of Present Illness: Patient presents for attempt at celiac artery intervention after previous femoral approach angiogram was unsuccessful at crossing the high-grade celiac lesion.  She has no new complaints today.  She still has postprandial abdominal pain, cramping and bloating.  Current Facility-Administered Medications  Medication Dose Route Frequency Provider Last Rate Last Admin  . fentaNYL (SUBLIMAZE) 100 MCG/2ML injection           . heparin sodium (porcine) 1000 UNIT/ML injection           . midazolam (VERSED) 5 MG/5ML injection           . 0.9 %  sodium chloride infusion   Intravenous Continuous Kris Hartmann, NP 75 mL/hr at 09/29/20 0735 New Bag at 09/29/20 0735  . ceFAZolin (ANCEF) 2-4 GM/100ML-% IVPB           . ceFAZolin (ANCEF) IVPB 2g/100 mL premix  2 g Intravenous Once Kris Hartmann, NP      . diphenhydrAMINE (BENADRYL) injection 50 mg  50 mg Intravenous Once PRN Kris Hartmann, NP      . famotidine (PEPCID) tablet 40 mg  40 mg Oral Once PRN Kris Hartmann, NP      . HYDROmorphone (DILAUDID) injection 1 mg  1 mg Intravenous Once PRN Eulogio Ditch E, NP      . methylPREDNISolone sodium succinate (SOLU-MEDROL) 125 mg/2 mL injection 125 mg  125 mg Intravenous Once PRN Eulogio Ditch E, NP      . midazolam (VERSED) 2 MG/ML syrup 8 mg  8 mg Oral Once PRN Kris Hartmann, NP      . ondansetron (ZOFRAN) injection 4 mg  4 mg Intravenous Q6H PRN Kris Hartmann, NP        Past Medical History:  Diagnosis Date  . Anemia   . Chronic back pain   . CKD (chronic kidney disease), stage II   . Coronary artery disease    a. 09/2014 NSTEMI/Cath: moderate 2-vessel disease;  b. 09/2016 NSTEMI/Cath: LM 30, LAD 50p, 76m, LCX nl, RCA 38m, 50d, EF 55-65%.  . Diverticulitis   .  Essential hypertension   . GERD (gastroesophageal reflux disease)   . H/O Vertebral Fractures   . History of echocardiogram    a. 09/2016 Echo: EF 55-60%, no rwma, mildly dil Ao root (3.7cm) and Asc Ao (3.9cm), mild to mod TR.  Marland Kitchen History of mania   . History of sciatica   . History of tobacco abuse   . Hyperlipidemia   . Osteopetrosis   . Peripheral neuropathy     Past Surgical History:  Procedure Laterality Date  . CARDIAC CATHETERIZATION N/A 10/21/2014   Procedure: Left Heart Cath and Coronary Angiography;  Surgeon: Wellington Hampshire, MD;  Location: [facility];  Service: Cardiovascular;  Laterality: N/A;  . CARDIAC CATHETERIZATION  10/21/2014   Dr. Fletcher Anon  . CHOLECYSTECTOMY    . LEFT HEART CATH AND CORONARY ANGIOGRAPHY N/A 10/09/2016   Procedure: Left Heart Cath and Coronary Angiography;  Surgeon: Nelva Bush, MD;  Location: [place] [facility];  Service: Cardiovascular;  Laterality: N/A;  . LEFT HEART CATH AND CORONARY ANGIOGRAPHY N/A 08/17/2019   Procedure: LEFT HEART CATH AND CORONARY ANGIOGRAPHY;  Surgeon: Wellington Hampshire, MD;  Location: [facility]  INV[facility]IVE [facility];  Service: Cardiovascular;  Laterality: N/A;  . VISCERAL ANGIOGRAPHY N/A 07/28/2020   Procedure: VISCERAL ANGIOGRAPHY;  Surgeon: Algernon Huxley, MD;  Location: [facility];  Service: Cardiovascular;  Laterality: N/A;     Social History   Tobacco Use  . Smoking status: Current Some Day Smoker    Packs/day: 0.25    Years: 50.00    Pack years: 12.50    Types: Cigarettes  . Smokeless tobacco: Never Used  . Tobacco comment: Smoking 2 cigarettes every other day.   Vaping Use  . Vaping Use: Never used  Substance Use Topics  . Alcohol use: No    Alcohol/week: 0.0 standard drinks  . Drug use: No     Family History  Problem Relation Age of Onset  . Other Mother        died @ 44.  . Cancer Father        died of pancreatic cancer  . CAD Brother        alive in late 33's w/ h/o stenting.  . Breast  cancer Neg Hx     No Known Allergies    REVIEW OF SYSTEMS(Negative unless checked)  Constitutional: [x] ??Weight loss [] ??Fever [] ??Chills Cardiac: [] ??Chest pain [] ??Chest pressure [] ??Palpitations [] ??Shortness of breath when laying flat [] ??Shortness of breath at rest [] ??Shortness of breath with exertion. Vascular: [] ??Pain in legs with walking [] ??Pain in legs at rest [] ??Pain in legs when laying flat [] ??Claudication [] ??Pain in feet when walking [] ??Pain in feet at rest [] ??Pain in feet when laying flat [] ??History of DVT [] ??Phlebitis [] ??Swelling in legs [] ??Varicose veins [] ??Non-healing ulcers Pulmonary: [] ??Uses home oxygen [] ??Productive cough [] ??Hemoptysis [] ??Wheeze [] ??COPD [] ??Asthma Neurologic: [] ??Dizziness [] ??Blackouts [] ??Seizures [] ??History of stroke [] ??History of TIA [] ??Aphasia [] ??Temporary blindness [] ??Dysphagia [] ??Weakness or numbness in arms [] ??Weakness or numbness in legs Musculoskeletal: [x] ??Arthritis [] ??Joint swelling [] ??Joint pain [x] ??Low back pain Hematologic: [] ??Easy bruising [] ??Easy bleeding [] ??Hypercoagulable state [] ??Anemic [] ??Hepatitis Gastrointestinal: [] ??Blood in stool [] ??Vomiting blood [x] ??Gastroesophageal reflux/heartburn [x] ??Abdominal pain Genitourinary: [] ??Chronic kidney disease [] ??Difficult urination [] ??Frequent urination [] ??Burning with urination [] ??Hematuria Skin: [] ??Rashes [] ??Ulcers [] ??Wounds Psychological: [] ??History of anxiety [] ??History of major depression.  Physical Examination  Vitals:   09/29/20 0725  BP: (!) 113/94  Temp: 97.9 F (36.6 C)  TempSrc: Oral  SpO2: 96%  Weight: 55.3 kg  Height: 5\' 2"  (1.575 m)   Body mass index is 22.31 kg/m. Gen: Thin, NAD Head: [place]/AT, No temporalis wasting.  Ear/Nose/Throat: Hearing grossly intact, nares w/o erythema or drainage, oropharynx w/o Erythema/Exudate,  Eyes:  Conjunctiva clear, sclera non-icteric Neck: Trachea midline.  No JVD.  Pulmonary:  Good air movement, respirations not labored, no use of accessory muscles.  Cardiac: RRR, normal S1, S2. Vascular:  Vessel Right Left  Radial Palpable Palpable                          PT Palpable Palpable  DP Palpable Palpable   Gastrointestinal: soft, non-tender/non-distended. No guarding/reflex.  Musculoskeletal: M/S 5/5 throughout.  Extremities without ischemic changes.  No deformity or atrophy.  Neurologic: Sensation grossly intact in extremities.  Symmetrical.  Speech is fluent. Motor exam as listed above. Psychiatric: Judgment intact, Mood & affect appropriate for pt's clinical situation. Dermatologic: No rashes or ulcers noted.  No cellulitis or open wounds.      CBC Lab Results  Component Value Date   WBC 5.9 09/29/2019   HGB 11.3 (L) 09/29/2019   HCT 34.2 (L) 09/29/2019   MCV 94.2 09/29/2019  PLT 157 09/29/2019    BMET    Component Value Date/Time   NA 140 09/29/2019 1207   NA 141 06/28/2017 1414   NA 145 10/18/2013 0618   K 4.1 09/29/2019 1207   K 3.8 10/18/2013 0618   CL 111 09/29/2019 1207   CL 113 (H) 10/18/2013 0618   CO2 24 09/29/2019 1207   CO2 28 10/18/2013 0618   GLUCOSE 100 (H) 09/29/2019 1207   GLUCOSE 70 10/18/2013 0618   BUN 17 09/29/2020 0716   BUN 22 06/28/2017 1414   BUN 14 10/18/2013 0618   CREATININE 1.30 (H) 09/29/2020 0716   CREATININE 1.02 10/18/2013 0618   CALCIUM 8.6 (L) 09/29/2019 1207   CALCIUM 8.2 (L) 10/18/2013 0618   GFRNONAA 42 (L) 09/29/2020 0716   GFRNONAA 56 (L) 10/18/2013 0618   GFRAA 38 (L) 09/29/2019 1207   GFRAA >60 10/18/2013 0618   Estimated Creatinine Clearance: 28.7 mL/min (A) (by C-G formula based on SCr of 1.3 mg/dL (H)).  COAG Lab Results  Component Value Date   INR 1.0 08/17/2019   INR 1.11 10/09/2016   INR 0.95 08/18/2016    Radiology No results found.   Assessment/Plan Essential hypertension blood  pressure control important in reducing the progression of atherosclerotic disease. On appropriate oral medications.   CKD (chronic kidney disease), stage II Mild. Hydrate and limit contrast with angiogram.  Hyperlipidemia lipid control important in reducing the progression of atherosclerotic disease. Continue statin therapy  Celiac artery stenosis (HCC) We discussed that approach from a radial access or brachial access may have more success at getting to the angle of the celiac artery.  It is certainly worth giving it a try from this approach given her significant symptomatic status.  This would still be far less morbid than an open surgical approach which would be more morbid that is likely of benefit for her symptoms.  Risks and benefits were discussed and she is agreeable to proceed.   Leotis Pain, MD  09/29/2020 8:17 AM

## 2020-09-29 NOTE — Progress Notes (Signed)
Manual pressure held now x 5 min. Above & below TR band site secondary to "firmness" felt at both sites. Pt. Tolerated well. Ecchymosis extending, but soft after manual pressure held.

## 2020-09-29 NOTE — Progress Notes (Incomplete)
Dr. Lucky Cowboy called & spoke with pt. Son [name] re: pt. Procedural results. TR band clean, dry, intact to left forearm

## 2020-09-29 NOTE — Progress Notes (Signed)
Noted pt. Left forearm appears more swollen around TR band. Vascular tech. Darnelle Maffucci [place] came and assessed arm & stated "it appears to be the same." Dr. Lucky Cowboy made aware. Pt. Med. With Dilaudid 0.5 mg slow IVP for c/o "8-9" out of 10 upon palpation of forearm. Arm remains elevated on pillow

## 2020-09-29 NOTE — Progress Notes (Signed)
Dr. Lucky Cowboy at bedside to assess left arm status. Noted hematoma soft all around TR band. MD noted ecchymosis along radial to upper forearm. Family called to pick up pt. At 1:00 pm today. DC instructions reviewed with pt. And daughter in law with verbalized understanding.

## 2020-09-30 ENCOUNTER — Telehealth (INDEPENDENT_AMBULATORY_CARE_PROVIDER_SITE_OTHER): Payer: Self-pay | Admitting: Vascular Surgery

## 2020-09-30 NOTE — Telephone Encounter (Signed)
Called stating that her arm and stomach are swollen and wanted to know if it was normal or what she needs to do. She also mentioned that she is having nausea spells and hasn't been able to eat. I spoke briefly with FB and she assured me to tell patient that the swelling is normal however if it continues to swell or ache over the weekend to go to the ED. Also, since the stomach is getting more blood flow it is normal for her stomach to have some swelling. Patient was made aware of NP's suggestions. I also scheduled patient for her first post op visit and she is to call if she has any questions/concenrs.   **Patient had Visceral Angio done 09/29/20**  This note is for documentation purposes only.

## 2020-10-03 DIAGNOSIS — I10 Essential (primary) hypertension: Secondary | ICD-10-CM | POA: Diagnosis not present

## 2020-10-03 DIAGNOSIS — I700 Atherosclerosis of aorta: Secondary | ICD-10-CM | POA: Diagnosis not present

## 2020-10-03 DIAGNOSIS — R110 Nausea: Secondary | ICD-10-CM | POA: Diagnosis not present

## 2020-10-20 DIAGNOSIS — E782 Mixed hyperlipidemia: Secondary | ICD-10-CM | POA: Diagnosis not present

## 2020-10-20 DIAGNOSIS — E119 Type 2 diabetes mellitus without complications: Secondary | ICD-10-CM | POA: Diagnosis not present

## 2020-10-20 DIAGNOSIS — M810 Age-related osteoporosis without current pathological fracture: Secondary | ICD-10-CM | POA: Diagnosis not present

## 2020-10-20 DIAGNOSIS — I10 Essential (primary) hypertension: Secondary | ICD-10-CM | POA: Diagnosis not present

## 2020-10-25 ENCOUNTER — Other Ambulatory Visit: Payer: Self-pay | Admitting: Cardiovascular Disease

## 2020-10-27 ENCOUNTER — Other Ambulatory Visit (INDEPENDENT_AMBULATORY_CARE_PROVIDER_SITE_OTHER): Payer: Self-pay | Admitting: Vascular Surgery

## 2020-10-27 DIAGNOSIS — I771 Stricture of artery: Secondary | ICD-10-CM | POA: Diagnosis not present

## 2020-10-27 DIAGNOSIS — N183 Chronic kidney disease, stage 3 unspecified: Secondary | ICD-10-CM | POA: Diagnosis not present

## 2020-10-27 DIAGNOSIS — M810 Age-related osteoporosis without current pathological fracture: Secondary | ICD-10-CM | POA: Diagnosis not present

## 2020-10-27 DIAGNOSIS — Z0000 Encounter for general adult medical examination without abnormal findings: Secondary | ICD-10-CM | POA: Diagnosis not present

## 2020-10-27 DIAGNOSIS — E782 Mixed hyperlipidemia: Secondary | ICD-10-CM | POA: Diagnosis not present

## 2020-10-27 DIAGNOSIS — K551 Chronic vascular disorders of intestine: Secondary | ICD-10-CM | POA: Diagnosis not present

## 2020-10-27 DIAGNOSIS — Z9862 Peripheral vascular angioplasty status: Secondary | ICD-10-CM

## 2020-10-27 DIAGNOSIS — E1122 Type 2 diabetes mellitus with diabetic chronic kidney disease: Secondary | ICD-10-CM | POA: Diagnosis not present

## 2020-10-27 DIAGNOSIS — I129 Hypertensive chronic kidney disease with stage 1 through stage 4 chronic kidney disease, or unspecified chronic kidney disease: Secondary | ICD-10-CM | POA: Diagnosis not present

## 2020-10-27 DIAGNOSIS — I2510 Atherosclerotic heart disease of native coronary artery without angina pectoris: Secondary | ICD-10-CM | POA: Diagnosis not present

## 2020-10-28 ENCOUNTER — Ambulatory Visit (INDEPENDENT_AMBULATORY_CARE_PROVIDER_SITE_OTHER): Payer: Medicare HMO | Admitting: Vascular Surgery

## 2020-10-28 ENCOUNTER — Other Ambulatory Visit: Payer: Self-pay

## 2020-10-28 ENCOUNTER — Encounter (INDEPENDENT_AMBULATORY_CARE_PROVIDER_SITE_OTHER): Payer: Self-pay | Admitting: Vascular Surgery

## 2020-10-28 ENCOUNTER — Ambulatory Visit (INDEPENDENT_AMBULATORY_CARE_PROVIDER_SITE_OTHER): Payer: Medicare HMO

## 2020-10-28 VITALS — BP 124/71 | HR 66 | Ht 62.0 in | Wt 117.0 lb

## 2020-10-28 DIAGNOSIS — K551 Chronic vascular disorders of intestine: Secondary | ICD-10-CM

## 2020-10-28 DIAGNOSIS — I10 Essential (primary) hypertension: Secondary | ICD-10-CM

## 2020-10-28 DIAGNOSIS — I774 Celiac artery compression syndrome: Secondary | ICD-10-CM

## 2020-10-28 DIAGNOSIS — E782 Mixed hyperlipidemia: Secondary | ICD-10-CM

## 2020-10-28 DIAGNOSIS — Z9862 Peripheral vascular angioplasty status: Secondary | ICD-10-CM

## 2020-10-28 DIAGNOSIS — N182 Chronic kidney disease, stage 2 (mild): Secondary | ICD-10-CM | POA: Diagnosis not present

## 2020-10-28 DIAGNOSIS — I771 Stricture of artery: Secondary | ICD-10-CM

## 2020-10-28 NOTE — Progress Notes (Signed)
MRN : [id number]  Cheryl Hall is a 80 y.o. (1940-12-13) female who presents with chief complaint of  Chief Complaint  Patient presents with  . Follow-up    4 week [facility]  Visceral Mesenteric   .  History of Present Illness: Patient returns today in follow up of her celiac stenosis.  She has undergone celiac angioplasty about 4 weeks ago for a left radial approach.  Her access site has healed well.  She has not noticed significant improvement in her nausea or abdominal pain.  Her duplex today shows normal velocities in the celiac and superior mesenteric artery consistent with successful intervention and no hemodynamically significant stenosis in these vessels.  Current Outpatient Medications  Medication Sig Dispense Refill  . acetaminophen (TYLENOL) 500 MG tablet Take 500 mg by mouth every 6 (six) hours as needed for mild pain or headache.    . alendronate (FOSAMAX) 70 MG tablet Take 70 mg by mouth once a week.  11  . amLODipine (NORVASC) 5 MG tablet Take 1 tablet by mouth once daily 90 tablet 1  . aspirin EC 81 MG EC tablet Take 1 tablet (81 mg total) by mouth daily. 30 tablet 3  . atorvastatin (LIPITOR) 80 MG tablet TAKE 1 TABLET BY MOUTH ONCE DAILY AT  6PM 90 tablet 3  . carvedilol (COREG) 6.25 MG tablet Take 1 tablet by mouth twice daily 180 tablet 3  . Cholecalciferol (VITAMIN D-3 PO) Take 1,000 Units by mouth daily.     . clopidogrel (PLAVIX) 75 MG tablet Take 1 tablet by mouth once daily with breakfast 90 tablet 0  . ferrous sulfate 325 (65 FE) MG tablet Take 325 mg by mouth daily.    . hydrochlorothiazide (HYDRODIURIL) 12.5 MG tablet Take 12.5 mg by mouth daily.     . isosorbide mononitrate (IMDUR) 30 MG 24 hr tablet Take 1/2 (one-half) tablet by mouth once daily 45 tablet 1  . lisinopril (ZESTRIL) 40 MG tablet Take 1 tablet by mouth once daily 90 tablet 0  . nitroGLYCERIN (NITROSTAT) 0.4 MG SL tablet Place 0.4 mg under the tongue every 5 (five) minutes as needed for chest pain.     . pantoprazole (PROTONIX) 40 MG tablet Take 1 tablet (40 mg total) by mouth daily. 30 tablet 5  . predniSONE (DELTASONE) 10 MG tablet Take by mouth.    . traMADol-acetaminophen (ULTRACET) 37.5-325 MG tablet Take 0.5 tablets by mouth every 6 (six) hours as needed (pain).  0  . vitamin B-12 (CYANOCOBALAMIN) 1000 MCG tablet Take 1,000 mcg by mouth daily.     No current facility-administered medications for this visit.    Past Medical History:  Diagnosis Date  . Anemia   . Chronic back pain   . CKD (chronic kidney disease), stage II   . Coronary artery disease    a. 09/2014 NSTEMI/Cath: moderate 2-vessel disease;  b. 09/2016 NSTEMI/Cath: LM 30, LAD 50p, 48m, LCX nl, RCA 68m, 50d, EF 55-65%.  . Diverticulitis   . Essential hypertension   . GERD (gastroesophageal reflux disease)   . H/O Vertebral Fractures   . History of echocardiogram    a. 09/2016 Echo: EF 55-60%, no rwma, mildly dil Ao root (3.7cm) and Asc Ao (3.9cm), mild to mod TR.  Marland Kitchen History of mania   . History of sciatica   . History of tobacco abuse   . Hyperlipidemia   . Osteopetrosis   . Peripheral neuropathy     Past Surgical History:  Procedure  Laterality Date  . CARDIAC CATHETERIZATION N/A 10/21/2014   Procedure: Left Heart Cath and Coronary Angiography;  Surgeon: Wellington Hampshire, MD;  Location: [facility];  Service: Cardiovascular;  Laterality: N/A;  . CARDIAC CATHETERIZATION  10/21/2014   Dr. Fletcher Anon  . CHOLECYSTECTOMY    . LEFT HEART CATH AND CORONARY ANGIOGRAPHY N/A 10/09/2016   Procedure: Left Heart Cath and Coronary Angiography;  Surgeon: Nelva Bush, MD;  Location: [facility];  Service: Cardiovascular;  Laterality: N/A;  . LEFT HEART CATH AND CORONARY ANGIOGRAPHY N/A 08/17/2019   Procedure: LEFT HEART CATH AND CORONARY ANGIOGRAPHY;  Surgeon: Wellington Hampshire, MD;  Location: [place] [facility];  Service: Cardiovascular;  Laterality: N/A;  . VISCERAL ANGIOGRAPHY N/A 07/28/2020   Procedure:  VISCERAL ANGIOGRAPHY;  Surgeon: Algernon Huxley, MD;  Location: [place] [facility];  Service: Cardiovascular;  Laterality: N/A;  . VISCERAL ANGIOGRAPHY N/A 09/29/2020   Procedure: VISCERAL ANGIOGRAPHY;  Surgeon: Algernon Huxley, MD;  Location: [facility];  Service: Cardiovascular;  Laterality: N/A;     Social History   Tobacco Use  . Smoking status: Current Some Day Smoker    Packs/day: 0.25    Years: 50.00    Pack years: 12.50    Types: Cigarettes  . Smokeless tobacco: Never Used  . Tobacco comment: Smoking 2 cigarettes every other day.   Vaping Use  . Vaping Use: Never used  Substance Use Topics  . Alcohol use: No    Alcohol/week: 0.0 standard drinks  . Drug use: No      Family History  Problem Relation Age of Onset  . Other Mother        died @ 58.  . Cancer Father        died of pancreatic cancer  . CAD Brother        alive in late 78's w/ h/o stenting.  . Breast cancer Neg Hx     No Known Allergies   REVIEW OF SYSTEMS (Negative unless checked)  Constitutional: [x] ?Weight loss  [] ?Fever  [] ?Chills Cardiac: [] ?Chest pain   [] ?Chest pressure   [] ?Palpitations   [] ?Shortness of breath when laying flat   [] ?Shortness of breath at rest   [] ?Shortness of breath with exertion. Vascular:  [] ?Pain in legs with walking   [] ?Pain in legs at rest   [] ?Pain in legs when laying flat   [] ?Claudication   [] ?Pain in feet when walking  [] ?Pain in feet at rest  [] ?Pain in feet when laying flat   [] ?History of DVT   [] ?Phlebitis   [] ?Swelling in legs   [] ?Varicose veins   [] ?Non-healing ulcers Pulmonary:   [] ?Uses home oxygen   [] ?Productive cough   [] ?Hemoptysis   [] ?Wheeze  [] ?COPD   [] ?Asthma Neurologic:  [] ?Dizziness  [] ?Blackouts   [] ?Seizures   [] ?History of stroke   [] ?History of TIA  [] ?Aphasia   [] ?Temporary blindness   [] ?Dysphagia   [] ?Weakness or numbness in arms   [] ?Weakness or numbness in legs Musculoskeletal:  [x] ?Arthritis   [] ?Joint swelling   [] ?Joint pain    [x] ?Low back pain Hematologic:  [] ?Easy bruising  [] ?Easy bleeding   [] ?Hypercoagulable state   [] ?Anemic  [] ?Hepatitis Gastrointestinal:  [] ?Blood in stool   [] ?Vomiting blood  [x] ?Gastroesophageal reflux/heartburn   [x] ?Abdominal pain Genitourinary:  [] ?Chronic kidney disease   [] ?Difficult urination  [] ?Frequent urination  [] ?Burning with urination   [] ?Hematuria Skin:  [] ?Rashes   [] ?Ulcers   [] ?Wounds Psychological:  [] ?History of anxiety   [] ?  History of major depression.  Physical Examination  BP 124/71   Pulse 66   Ht 5\' 2"  (1.575 m)   Wt 117 lb (53.1 kg)   BMI 21.40 kg/m  Gen:  WD/WN, NAD.  Appears younger than stated age Head: [place]/AT, No temporalis wasting. Ear/Nose/Throat: Hearing grossly intact, nares w/o erythema or drainage Eyes: Conjunctiva clear. Sclera non-icteric Neck: Supple.  Trachea midline Pulmonary:  Good air movement, no use of accessory muscles.  Cardiac: RRR, no JVD Vascular:  Vessel Right Left  Radial Palpable Palpable               Musculoskeletal: M/S 5/5 throughout.  No deformity or atrophy.  No edema. Neurologic: Sensation grossly intact in extremities.  Symmetrical.  Speech is fluent.  Psychiatric: Judgment intact, Mood & affect appropriate for pt's clinical situation. Dermatologic: No rashes or ulcers noted.  No cellulitis or open wounds.       Labs Recent Results (from the past 2160 hour(s))  SARS CORONAVIRUS 2 (TAT 6-24 HRS) Nasopharyngeal Nasopharyngeal Swab     Status: None   Collection Time: 09/27/20  8:10 AM   Specimen: Nasopharyngeal Swab  Result Value Ref Range   SARS Coronavirus 2 NEGATIVE NEGATIVE    Comment: (NOTE) SARS-CoV-2 target nucleic acids are NOT DETECTED.  The SARS-CoV-2 RNA is generally detectable in upper and lower respiratory specimens during the acute phase of infection. Negative results do not preclude SARS-CoV-2 infection, do not rule out co-infections with other pathogens, and should not be used as  the sole basis for treatment or other patient management decisions. Negative results must be combined with clinical observations, patient history, and epidemiological information. The expected result is Negative.  Fact Sheet for Patients: SugarRoll.be  Fact Sheet for Healthcare Providers: https://www.woods-mathews.com/  This test is not yet approved or cleared by the [place] FDA and  has been authorized for detection and/or diagnosis of SARS-CoV-2 by FDA under an Emergency Use Authorization (EUA). This EUA will remain  in effect (meaning this test can be used) for the duration of the COVID-19 declaration under Se ction 564(b)(1) of the Act, 21 U.S.C. section 360bbb-3(b)(1), unless the authorization is terminated or revoked sooner.  Performed at [facility], [place] [address], [place], [place] [address]   BUN     Status: None   Collection Time: 09/29/20  7:16 AM  Result Value Ref Range   BUN 17 8 - 23 mg/dL    Comment: Performed at [facility], [address] [place], [place] [address]  Creatinine, serum     Status: Abnormal   Collection Time: 09/29/20  7:16 AM  Result Value Ref Range   Creatinine, Ser 1.30 (H) 0.44 - 1.00 mg/dL   GFR, Estimated 42 (L) >60 mL/min    Comment: (NOTE) Calculated using the CKD-EPI Creatinine Equation (2021) Performed at [facility], [address], [place], [place] [address]     Radiology PERIPHERAL VASCULAR CATHETERIZATION  Result Date: 09/29/2020 See op note  VAS [place] MESENTERIC  Result Date: 10/28/2020 ABDOMINAL VISCERAL Patient Name:  KELAIAH ESCALONA  Date of Exam:   10/28/2020 Medical Rec #: [id number]     Accession #:    [id number] Date of Birth: 1940-08-26      Patient Gender: F Patient Age:   84Y Exam Location:  [facility] Procedure:      VAS [place] MESENTERIC Referring Phys: [id number] Erskine Squibb Marua Qin  -------------------------------------------------------------------------------- Indications: S/P Angioplasty Vascular Interventions: 07/28/2020: Catheter placement  into the SMA and Celiac                         Artery from Right Femoral approach. Aortogram and                         Selective Angiogram of the SMA and Celiac Artery.                          09/29/2020: Catheter placement into the Celiac Artery                         from Left Radial Approach. Aortogram and Selective                         Angiogram of the Celiac Artery. PTA of the Celiac Artery                         with 5 and 6 mm diameter Lutonix drug coated Angioplasty                         balloon. Performing Technologist: Almira Coaster RVS  Examination Guidelines: A complete evaluation includes B-mode imaging, spectral Doppler, color Doppler, and power Doppler as needed of all accessible portions of each vessel. Bilateral testing is considered an integral part of a complete examination. Limited examinations for reoccurring indications may be performed as noted.  Duplex Findings: +----------------------+--------+--------+------+--------+ Mesenteric            PSV cm/sEDV cm/sPlaqueComments +----------------------+--------+--------+------+--------+ Aorta Prox               56      12                  +----------------------+--------+--------+------+--------+ Aorta Mid                67      25                  +----------------------+--------+--------+------+--------+ Aorta Distal             63      20                  +----------------------+--------+--------+------+--------+ Celiac Artery Origin    157      33                  +----------------------+--------+--------+------+--------+ Celiac Artery Proximal  143      37                  +----------------------+--------+--------+------+--------+ SMA Origin              155      36                   +----------------------+--------+--------+------+--------+ SMA Proximal            259      36                  +----------------------+--------+--------+------+--------+ SMA Mid                 112      24                  +----------------------+--------+--------+------+--------+ SMA  Distal              162      29                  +----------------------+--------+--------+------+--------+ CHA                      65      17                  +----------------------+--------+--------+------+--------+ Splenic                  55      18                  +----------------------+--------+--------+------+--------+    Summary: Largest Aortic Diameter: 2.6 cm  Mesenteric: Normal Superior Mesenteric artery, Splenic artery and Hepatic artery findings.  *See table(s) above for measurements and observations.  Diagnosing physician: Leotis Pain MD  Electronically signed by Leotis Pain MD on 10/28/2020 at 10:55:21 AM.    Final     Assessment/Plan  Essential hypertension blood pressure control important in reducing the progression of atherosclerotic disease. On appropriate oral medications.   CKD (chronic kidney disease), stage II Mild.  Hydrate and limit contrast with angiogram.  Hyperlipidemia lipid control important in reducing the progression of atherosclerotic disease. Continue statin therapy  Celiac artery stenosis (HCC) Her duplex today shows normal velocities in the celiac and superior mesenteric artery consistent with successful intervention and no hemodynamically significant stenosis in these vessels.  Despite this, she continues to have abdominal pain and nausea symptoms.  It is unlikely to be from chronic mesenteric ischemia at this point given the normal findings on duplex and recent successful intervention.  We will see her back in 6 months at this point, but I will defer further work-up to her primary care physician or gastroenterologist.    Leotis Pain,  MD  10/28/2020 2:32 PM    This note was created with Dragon medical transcription system.  Any errors from dictation are purely unintentional

## 2020-10-28 NOTE — Assessment & Plan Note (Signed)
Her duplex today shows normal velocities in the celiac and superior mesenteric artery consistent with successful intervention and no hemodynamically significant stenosis in these vessels.  Despite this, she continues to have abdominal pain and nausea symptoms.  It is unlikely to be from chronic mesenteric ischemia at this point given the normal findings on duplex and recent successful intervention.  We will see her back in 6 months at this point, but I will defer further work-up to her primary care physician or gastroenterologist.

## 2020-11-09 DIAGNOSIS — R1084 Generalized abdominal pain: Secondary | ICD-10-CM | POA: Diagnosis not present

## 2020-11-09 DIAGNOSIS — D509 Iron deficiency anemia, unspecified: Secondary | ICD-10-CM | POA: Diagnosis not present

## 2020-12-06 ENCOUNTER — Ambulatory Visit: Payer: Medicare HMO | Admitting: Cardiovascular Disease

## 2020-12-06 ENCOUNTER — Other Ambulatory Visit: Payer: Self-pay

## 2020-12-06 VITALS — BP 122/70 | HR 72 | Ht 62.0 in | Wt 116.4 lb

## 2020-12-06 DIAGNOSIS — I10 Essential (primary) hypertension: Secondary | ICD-10-CM | POA: Diagnosis not present

## 2020-12-06 DIAGNOSIS — E785 Hyperlipidemia, unspecified: Secondary | ICD-10-CM | POA: Diagnosis not present

## 2020-12-06 DIAGNOSIS — I2510 Atherosclerotic heart disease of native coronary artery without angina pectoris: Secondary | ICD-10-CM | POA: Diagnosis not present

## 2020-12-06 DIAGNOSIS — Z87891 Personal history of nicotine dependence: Secondary | ICD-10-CM | POA: Diagnosis not present

## 2020-12-06 NOTE — Progress Notes (Signed)
Cardiology Office Note   Date:  12/06/2020   ID:  Cheryl Hall, DOB [birth year], MRN [id number]  PCP:  Marinda Elk, MD  Cardiologist:   [name], MD   Chief Complaint  Patient presents with   Other    6 month f/u no complaints today. Meds reviewed verbally with pt.      History of Present Illness: Cheryl Hall is a 80 y.o. 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 She has known history of hypertension, GERD, hyperlipidemia, ongoing tobacco use and chronic kidney disease stage II .  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  In total, she had 3 presentations with non-ST elevation myocardial infarction since May 2016.  Cardiac catheterization was done on each occasion with no evidence of obstructive disease.  Some of these episodes were in the setting of uncontrolled hypertension.  Renal artery duplex showed normal renal arteries. Most recent presentation was in March 2021.  High-sensitivity troponin was elevated to 1500.  Cardiac catheterization was done again and showed moderately calcified coronary arteries with mild to moderate nonobstructive disease.  No significant change since most recent cardiac catheterization.  Ejection fraction was normal with mildly elevated left ventricular end-diastolic pressure.  She was placed on clopidogrel.   She quit smoking after her most recent myocardial infarction.    She had celiac artery drug-coated balloon angioplasty in May of this year for GI symptoms but the patient had no significant improvement.  She has been stable from a cardiac standpoint with no recent chest pain or worsening dyspnea.  Past Medical History:  Diagnosis Date   Anemia    Chronic back pain    CKD (chronic kidney disease), stage II    Coronary artery disease    a. 09/2014 NSTEMI/Cath: moderate 2-vessel disease;  b. 09/2016  NSTEMI/Cath: LM 30, LAD 50p, 46m, LCX nl, RCA 45m, 50d, EF 55-65%.   Diverticulitis    Essential hypertension    GERD (gastroesophageal reflux disease)    H/O Vertebral Fractures    History of echocardiogram    a. 09/2016 Echo: EF 55-60%, no rwma, mildly dil Ao root (3.7cm) and Asc Ao (3.9cm), mild to mod TR.   History of mania    History of sciatica    History of tobacco abuse    Hyperlipidemia    Osteopetrosis    Peripheral neuropathy     Past Surgical History:  Procedure Laterality Date   CARDIAC CATHETERIZATION N/A 10/21/2014   Procedure: Left Heart Cath and Coronary Angiography;  Surgeon: Wellington Hampshire, MD;  Location: [facility];  Service: Cardiovascular;  Laterality: N/A;   CARDIAC CATHETERIZATION  10/21/2014   Dr. Fletcher Anon   CHOLECYSTECTOMY     LEFT HEART CATH AND CORONARY ANGIOGRAPHY N/A 10/09/2016   Procedure: Left Heart Cath and Coronary Angiography;  Surgeon: Nelva Bush, MD;  Location: [facility];  Service: Cardiovascular;  Laterality: N/A;   LEFT HEART CATH AND CORONARY ANGIOGRAPHY N/A 08/17/2019   Procedure: LEFT HEART CATH AND CORONARY ANGIOGRAPHY;  Surgeon: Wellington Hampshire, MD;  Location: [facility];  Service: Cardiovascular;  Laterality: N/A;   VISCERAL ANGIOGRAPHY N/A 07/28/2020   Procedure: VISCERAL ANGIOGRAPHY;  Surgeon: Algernon Huxley, MD;  Location: [facility];  Service: Cardiovascular;  Laterality: N/A;   VISCERAL ANGIOGRAPHY N/A 09/29/2020   Procedure: VISCERAL ANGIOGRAPHY;  Surgeon: Algernon Huxley, MD;  Location: [facility];  Service: Cardiovascular;  Laterality: N/A;     Current Outpatient Medications  Medication Sig Dispense Refill   acetaminophen (TYLENOL) 500 MG tablet Take 500 mg by mouth every 6 (six) hours as needed for mild pain or headache.     alendronate (FOSAMAX) 70 MG tablet Take 70 mg by mouth once a week.  11   amLODipine (NORVASC) 5 MG tablet Take 1 tablet by mouth once daily 90 tablet 1    aspirin EC 81 MG EC tablet Take 1 tablet (81 mg total) by mouth daily. 30 tablet 3   atorvastatin (LIPITOR) 80 MG tablet TAKE 1 TABLET BY MOUTH ONCE DAILY AT  6PM 90 tablet 3   carvedilol (COREG) 6.25 MG tablet Take 1 tablet by mouth twice daily 180 tablet 3   Cholecalciferol (VITAMIN D-3 PO) Take 1,000 Units by mouth daily.      clopidogrel (PLAVIX) 75 MG tablet Take 1 tablet by mouth once daily with breakfast 90 tablet 0   ferrous sulfate 325 (65 FE) MG tablet Take 325 mg by mouth daily.     hydrochlorothiazide (HYDRODIURIL) 12.5 MG tablet Take 12.5 mg by mouth daily.      isosorbide mononitrate (IMDUR) 30 MG 24 hr tablet Take 1/2 (one-half) tablet by mouth once daily 45 tablet 1   lisinopril (ZESTRIL) 40 MG tablet Take 1 tablet by mouth once daily 90 tablet 0   nitroGLYCERIN (NITROSTAT) 0.4 MG SL tablet Place 0.4 mg under the tongue every 5 (five) minutes as needed for chest pain.     pantoprazole (PROTONIX) 40 MG tablet Take 1 tablet (40 mg total) by mouth daily. 30 tablet 5   predniSONE (DELTASONE) 10 MG tablet Take by mouth.     traMADol-acetaminophen (ULTRACET) 37.5-325 MG tablet Take 0.5 tablets by mouth every 6 (six) hours as needed (pain).  0   vitamin B-12 (CYANOCOBALAMIN) 1000 MCG tablet Take 1,000 mcg by mouth daily.     No current facility-administered medications for this visit.    Allergies:   Patient has no known allergies.    Social History:  The patient  reports that she has been smoking cigarettes. She has a 12.50 pack-year smoking history. She has never used smokeless tobacco. She reports that she does not drink alcohol and does not use drugs.   Family History:  The patient's family history includes CAD in her brother; Cancer in her father; Other in her mother.    ROS:  Please see the history of present illness.   Otherwise, review of systems are positive for none.   All other systems are reviewed and negative.    PHYSICAL EXAM: VS:  BP 122/70 (BP Location: Left  Arm, Patient Position: Sitting, Cuff Size: Normal)   Pulse 72   Ht 5\' 2"  (1.575 m)   Wt 116 lb 6 oz (52.8 kg)   SpO2 96%   BMI 21.29 kg/m  , BMI Body mass index is 21.29 kg/m. GEN: Well nourished, well developed, in no acute distress  HEENT: normal  Neck: no JVD, carotid bruits, or masses Cardiac: RRR; no murmurs, rubs, or gallops,no edema  Respiratory:  clear to auscultation bilaterally, normal work of breathing GI: soft, nontender, nondistended, + BS MS: no deformity or atrophy  Skin: warm and dry, no rash Neuro:  Strength and sensation are intact Psych: euthymic mood, full affect   EKG:  EKG is ordered today. The ekg ordered today demonstrates normal sinus rhythm with no significant ST  or T wave changes.   Recent Labs: 09/29/2020: BUN 17; Creatinine, Ser 1.30    Lipid Panel    Component Value Date/Time   CHOL 150 08/18/2019 0428   CHOL 149 01/31/2012 0454   TRIG 91 08/18/2019 0428   TRIG 111 01/31/2012 0454   HDL 51 08/18/2019 0428   HDL 58 01/31/2012 0454   CHOLHDL 2.9 08/18/2019 0428   VLDL 18 08/18/2019 0428   VLDL 22 01/31/2012 0454   LDLCALC 81 08/18/2019 0428   LDLCALC 69 01/31/2012 0454      Wt Readings from Last 3 Encounters:  12/06/20 116 lb 6 oz (52.8 kg)  10/28/20 117 lb (53.1 kg)  09/29/20 122 lb (55.3 kg)        ASSESSMENT AND PLAN:  1.  Coronary artery disease involving native coronary arteries with recurrent myocardial infarction without obstructive disease.  Currently with no anginal symptoms.  It has been more than 1 year since her most recent myocardial infarction and thus I elected to discontinue clopidogrel.  Continue aspirin indefinitely.  2. Essential hypertension: Blood pressure is well controlled on current medications.  3. Hyperlipidemia: I reviewed most recent lipid profile done in May which showed an LDL of 73 which is close to target of less than 70.  Based on this, continue atorvastatin 80 mg daily.  4.  Previous tobacco use:  She quit last year so far no relapse.   Disposition:   FU with me in 6 months  Signed,  [name], MD  12/06/2020 2:25 PM    [place]

## 2020-12-06 NOTE — Patient Instructions (Signed)
Medication Instructions:  Your physician has recommended you make the following change in your medication:   STOP Plavix (clopidogrel)  Continue your other medications as prescribed.  *If you need a refill on your cardiac medications before your next appointment, please call your pharmacy*   Lab Work: None ordered If you have labs (blood work) drawn today and your tests are completely normal, you will receive your results only by: [place] (if you have MyChart) OR A paper copy in the mail If you have any lab test that is abnormal or we need to change your treatment, we will call you to review the results.   Testing/Procedures: None ordered   Follow-Up: At [facility], you and your health needs are our priority.  As part of our continuing mission to provide you with exceptional heart care, we have created designated Provider Care Teams.  These Care Teams include your primary Cardiologist (physician) and Advanced Practice Providers (APPs -  Physician Assistants and Nurse Practitioners) who all work together to provide you with the care you need, when you need it.  We recommend signing up for the patient portal called "MyChart".  Sign up information is provided on this After Visit Summary.  MyChart is used to connect with patients for Virtual Visits (Telemedicine).  Patients are able to view lab/test results, encounter notes, upcoming appointments, etc.  Non-urgent messages can be sent to your provider as well.   To learn more about what you can do with MyChart, go to [email or web address].    Your next appointment:   Your physician wants you to follow-up in: 6 months You will receive a reminder letter in the mail two months in advance. If you don't receive a letter, please call our office to schedule the follow-up appointment.   The format for your next appointment:   In Person  Provider:   You may see [name], MD or one of the following Advanced Practice  Providers on your designated Care Team:   Murray Hodgkins, NP Christell Faith, PA-C Marrianne Mood, PA-C Cadence Kathlen Mody, [place]   Other Instructions N/A

## 2020-12-07 NOTE — Addendum Note (Signed)
Addended by: Britt Bottom on: 12/07/2020 10:37 AM   Modules accepted: Orders

## 2020-12-14 ENCOUNTER — Other Ambulatory Visit: Payer: Self-pay | Admitting: Cardiovascular Disease

## 2020-12-19 ENCOUNTER — Other Ambulatory Visit: Payer: Self-pay | Admitting: Cardiovascular Disease

## 2020-12-28 ENCOUNTER — Other Ambulatory Visit: Payer: Self-pay | Admitting: Cardiovascular Disease

## 2021-01-23 ENCOUNTER — Other Ambulatory Visit: Payer: Self-pay | Admitting: Cardiovascular Disease

## 2021-02-10 DIAGNOSIS — E1122 Type 2 diabetes mellitus with diabetic chronic kidney disease: Secondary | ICD-10-CM | POA: Diagnosis not present

## 2021-02-10 DIAGNOSIS — I129 Hypertensive chronic kidney disease with stage 1 through stage 4 chronic kidney disease, or unspecified chronic kidney disease: Secondary | ICD-10-CM | POA: Diagnosis not present

## 2021-02-10 DIAGNOSIS — E782 Mixed hyperlipidemia: Secondary | ICD-10-CM | POA: Diagnosis not present

## 2021-02-10 DIAGNOSIS — I10 Essential (primary) hypertension: Secondary | ICD-10-CM | POA: Diagnosis not present

## 2021-02-10 DIAGNOSIS — I2510 Atherosclerotic heart disease of native coronary artery without angina pectoris: Secondary | ICD-10-CM | POA: Diagnosis not present

## 2021-02-10 DIAGNOSIS — E119 Type 2 diabetes mellitus without complications: Secondary | ICD-10-CM | POA: Diagnosis not present

## 2021-02-10 DIAGNOSIS — M810 Age-related osteoporosis without current pathological fracture: Secondary | ICD-10-CM | POA: Diagnosis not present

## 2021-02-10 DIAGNOSIS — I771 Stricture of artery: Secondary | ICD-10-CM | POA: Diagnosis not present

## 2021-02-10 DIAGNOSIS — Z23 Encounter for immunization: Secondary | ICD-10-CM | POA: Diagnosis not present

## 2021-02-17 ENCOUNTER — Encounter: Payer: Self-pay | Admitting: *Deleted

## 2021-02-20 ENCOUNTER — Ambulatory Visit: Payer: Medicare HMO | Admitting: Certified Registered"

## 2021-02-20 ENCOUNTER — Ambulatory Visit
Admission: RE | Admit: 2021-02-20 | Discharge: 2021-02-20 | Disposition: A | Discharge status: Home / Self Care | Payer: Medicare HMO | Source: Ambulatory Visit | Attending: Gastroenterology | Admitting: Gastroenterology

## 2021-02-20 ENCOUNTER — Encounter: Payer: Self-pay | Admitting: *Deleted

## 2021-02-20 ENCOUNTER — Encounter
Admission: RE | Disposition: A | Discharge status: Home / Self Care | Payer: Self-pay | Source: Ambulatory Visit | Attending: Gastroenterology

## 2021-02-20 DIAGNOSIS — D123 Benign neoplasm of transverse colon: Secondary | ICD-10-CM | POA: Insufficient documentation

## 2021-02-20 DIAGNOSIS — K2970 Gastritis, unspecified, without bleeding: Secondary | ICD-10-CM | POA: Insufficient documentation

## 2021-02-20 DIAGNOSIS — Z7952 Long term (current) use of systemic steroids: Secondary | ICD-10-CM | POA: Diagnosis not present

## 2021-02-20 DIAGNOSIS — D122 Benign neoplasm of ascending colon: Secondary | ICD-10-CM | POA: Diagnosis not present

## 2021-02-20 DIAGNOSIS — K5730 Diverticulosis of large intestine without perforation or abscess without bleeding: Secondary | ICD-10-CM | POA: Insufficient documentation

## 2021-02-20 DIAGNOSIS — K31819 Angiodysplasia of stomach and duodenum without bleeding: Secondary | ICD-10-CM | POA: Diagnosis not present

## 2021-02-20 DIAGNOSIS — I2510 Atherosclerotic heart disease of native coronary artery without angina pectoris: Secondary | ICD-10-CM | POA: Insufficient documentation

## 2021-02-20 DIAGNOSIS — K635 Polyp of colon: Secondary | ICD-10-CM | POA: Diagnosis not present

## 2021-02-20 DIAGNOSIS — K5900 Constipation, unspecified: Secondary | ICD-10-CM | POA: Insufficient documentation

## 2021-02-20 DIAGNOSIS — K219 Gastro-esophageal reflux disease without esophagitis: Secondary | ICD-10-CM | POA: Diagnosis not present

## 2021-02-20 DIAGNOSIS — R1084 Generalized abdominal pain: Secondary | ICD-10-CM | POA: Diagnosis not present

## 2021-02-20 DIAGNOSIS — Z79899 Other long term (current) drug therapy: Secondary | ICD-10-CM | POA: Diagnosis not present

## 2021-02-20 DIAGNOSIS — D125 Benign neoplasm of sigmoid colon: Secondary | ICD-10-CM | POA: Insufficient documentation

## 2021-02-20 DIAGNOSIS — D124 Benign neoplasm of descending colon: Secondary | ICD-10-CM | POA: Diagnosis not present

## 2021-02-20 DIAGNOSIS — Z7983 Long term (current) use of bisphosphonates: Secondary | ICD-10-CM | POA: Diagnosis not present

## 2021-02-20 DIAGNOSIS — D509 Iron deficiency anemia, unspecified: Secondary | ICD-10-CM | POA: Insufficient documentation

## 2021-02-20 DIAGNOSIS — Z7982 Long term (current) use of aspirin: Secondary | ICD-10-CM | POA: Insufficient documentation

## 2021-02-20 DIAGNOSIS — K3189 Other diseases of stomach and duodenum: Secondary | ICD-10-CM | POA: Diagnosis not present

## 2021-02-20 DIAGNOSIS — Z800 Family history of malignant neoplasm of digestive organs: Secondary | ICD-10-CM | POA: Insufficient documentation

## 2021-02-20 HISTORY — DX: Other specified enthesopathies of unspecified lower limb, excluding foot: M76.899

## 2021-02-20 HISTORY — DX: Altered mental status, unspecified: R41.82

## 2021-02-20 HISTORY — DX: Enterocolitis due to Clostridium difficile, not specified as recurrent: A04.72

## 2021-02-20 HISTORY — DX: Vitamin D deficiency, unspecified: E55.9

## 2021-02-20 HISTORY — PX: COLONOSCOPY WITH PROPOFOL: SHX5780

## 2021-02-20 HISTORY — DX: Other specified postprocedural states: Z98.890

## 2021-02-20 HISTORY — DX: Low back pain, unspecified: M54.50

## 2021-02-20 HISTORY — PX: ESOPHAGOGASTRODUODENOSCOPY: SHX5428

## 2021-02-20 HISTORY — DX: Anxiety disorder, unspecified: F41.9

## 2021-02-20 SURGERY — COLONOSCOPY WITH PROPOFOL
Anesthesia: General

## 2021-02-20 MED ORDER — PROPOFOL 10 MG/ML IV BOLUS
INTRAVENOUS | Status: DC | PRN
  Administered 2021-02-20: 70 mg via INTRAVENOUS

## 2021-02-20 MED ORDER — SODIUM CHLORIDE 0.9 % IV SOLN: INTRAVENOUS | Status: DC

## 2021-02-20 MED ORDER — PROPOFOL 500 MG/50ML IV EMUL
INTRAVENOUS | Status: DC | PRN
  Administered 2021-02-20: 120 ug/kg/min via INTRAVENOUS

## 2021-02-20 MED ORDER — LIDOCAINE 2% (20 MG/ML) 5 ML SYRINGE
INTRAMUSCULAR | Status: DC | PRN
  Administered 2021-02-20: 25 mg via INTRAVENOUS

## 2021-02-20 NOTE — Transfer of Care (Signed)
Immediate Anesthesia Transfer of Care Note  Patient: TANIESHA GLANZ  Procedure(s) Performed: COLONOSCOPY WITH PROPOFOL ESOPHAGOGASTRODUODENOSCOPY (EGD)  Patient Location: Endoscopy Unit  Anesthesia Type:General  Level of Consciousness: awake  Airway & Oxygen Therapy: Patient Spontanous Breathing  Post-op Assessment: Report given to RN and Post -op Vital signs reviewed and stable  Post vital signs: Reviewed  Last Vitals:  Vitals Value Taken Time  BP 96/47 02/20/21 1029  Temp 36.2 C 02/20/21 1029  Pulse 61 02/20/21 1031  Resp 22 02/20/21 1031  SpO2 100 % 02/20/21 1031  Vitals shown include unvalidated device data.  Last Pain:  Vitals:   02/20/21 1029  TempSrc: Temporal  PainSc: Asleep         Complications: No notable events documented.

## 2021-02-20 NOTE — Op Note (Signed)
[facility] Gastroenterology Patient Name: Cheryl Hall Procedure Date: 02/20/2021 9:32 AM MRN: [id number] Account #: [id number] Date of Birth: 23-Sep-1940 Admit Type: Outpatient Age: 80 Room: [facility] ENDO ROOM 3 Gender: Female Note Status: Finalized Instrument Name: Upper Endoscope [id number] Procedure:             Upper GI endoscopy Indications:           Iron deficiency anemia, Dyspepsia Providers:             Andrey Farmer MD, MD Medicines:             Monitored Anesthesia Care Complications:         No immediate complications. Estimated blood loss:                         Minimal. Procedure:             Pre-Anesthesia Assessment:                        - Prior to the procedure, a History and Physical was                         performed, and patient medications and allergies were                         reviewed. The patient is competent. The risks and                         benefits of the procedure and the sedation options and                         risks were discussed with the patient. All questions                         were answered and informed consent was obtained.                         Patient identification and proposed procedure were                         verified by the physician, the nurse, the anesthetist                         and the technician in the endoscopy suite. Mental                         Status Examination: alert and oriented. Airway                         Examination: normal oropharyngeal airway and neck                         mobility. Respiratory Examination: clear to                         auscultation. CV Examination: normal. Prophylactic                         Antibiotics: The patient does not require prophylactic  antibiotics. Prior Anticoagulants: The patient has                         taken no previous anticoagulant or antiplatelet                         agents. ASA Grade Assessment: III - A  patient with                         severe systemic disease. After reviewing the risks and                         benefits, the patient was deemed in satisfactory                         condition to undergo the procedure. The anesthesia                         plan was to use monitored anesthesia care (MAC).                         Immediately prior to administration of medications,                         the patient was re-assessed for adequacy to receive                         sedatives. The heart rate, respiratory rate, oxygen                         saturations, blood pressure, adequacy of pulmonary                         ventilation, and response to care were monitored                         throughout the procedure. The physical status of the                         patient was re-assessed after the procedure.                        After obtaining informed consent, the endoscope was                         passed under direct vision. Throughout the procedure,                         the patient's blood pressure, pulse, and oxygen                         saturations were monitored continuously. The Endoscope                         was introduced through the mouth, and advanced to the                         second part of duodenum. The upper GI endoscopy was  accomplished without difficulty. The patient tolerated                         the procedure well. Findings:      The examined esophagus was normal.      Patchy minimal inflammation characterized by erythema was found in the       gastric antrum. Biopsies were taken with a cold forceps for Helicobacter       pylori testing. Estimated blood loss was minimal.      A single less than 1 mm angioectasia without bleeding was found in the       second portion of the duodenum.      The exam of the duodenum was otherwise normal. Impression:            - Normal esophagus.                        -  Gastritis. Biopsied.                        - A single non-bleeding angioectasia in the duodenum. Recommendation:        - Discharge patient to home.                        - Resume previous diet.                        - Continue present medications.                        - Await pathology results.                        - Perform a colonoscopy today. Procedure Code(s):     --- Professional ---                        [id number], Esophagogastroduodenoscopy, flexible,                         transoral; with biopsy, single or multiple Diagnosis Code(s):     --- Professional ---                        K29.70, Gastritis, unspecified, without bleeding                        K31.819, Angiodysplasia of stomach and duodenum                         without bleeding                        D50.9, Iron deficiency anemia, unspecified                        R10.13, Epigastric pain CPT copyright 2019 American Medical Association. All rights reserved. The codes documented in this report are preliminary and upon coder review may  be revised to meet current compliance requirements. Andrey Farmer MD, MD 02/20/2021 10:28:49 AM Number of Addenda: 0 Note Initiated On: 02/20/2021 9:32 AM Estimated Blood Loss:  Estimated blood loss was minimal.      [place]  [facility]

## 2021-02-20 NOTE — Op Note (Signed)
[facility] Gastroenterology Patient Name: Cheryl Hall Procedure Date: 02/20/2021 9:31 AM MRN: [id number] Account #: [id number] Date of Birth: 09-17-1940 Admit Type: Outpatient Age: 80 Room: [facility] ENDO ROOM 3 Gender: Female Note Status: Finalized Instrument Name: Peds Colonoscope [id number] Procedure:             Colonoscopy Indications:           Generalized abdominal pain, Iron deficiency anemia Providers:             Andrey Farmer MD, MD Medicines:             Monitored Anesthesia Care Complications:         No immediate complications. Estimated blood loss:                         Minimal. Procedure:             Pre-Anesthesia Assessment:                        - Prior to the procedure, a History and Physical was                         performed, and patient medications and allergies were                         reviewed. The patient is competent. The risks and                         benefits of the procedure and the sedation options and                         risks were discussed with the patient. All questions                         were answered and informed consent was obtained.                         Patient identification and proposed procedure were                         verified by the physician, the nurse, the anesthetist                         and the technician in the endoscopy suite. Mental                         Status Examination: alert and oriented. Airway                         Examination: normal oropharyngeal airway and neck                         mobility. Respiratory Examination: clear to                         auscultation. CV Examination: normal. Prophylactic                         Antibiotics: The patient does not require prophylactic  antibiotics. Prior Anticoagulants: The patient has                         taken no previous anticoagulant or antiplatelet                         agents. ASA Grade  Assessment: III - A patient with                         severe systemic disease. After reviewing the risks and                         benefits, the patient was deemed in satisfactory                         condition to undergo the procedure. The anesthesia                         plan was to use monitored anesthesia care (MAC).                         Immediately prior to administration of medications,                         the patient was re-assessed for adequacy to receive                         sedatives. The heart rate, respiratory rate, oxygen                         saturations, blood pressure, adequacy of pulmonary                         ventilation, and response to care were monitored                         throughout the procedure. The physical status of the                         patient was re-assessed after the procedure.                        After obtaining informed consent, the colonoscope was                         passed under direct vision. Throughout the procedure,                         the patient's blood pressure, pulse, and oxygen                         saturations were monitored continuously. The                         Colonoscope was introduced through the anus and                         advanced to the the terminal ileum. The colonoscopy  was somewhat difficult due to a tortuous colon. The                         patient tolerated the procedure well. The quality of                         the bowel preparation was good except the cecum was                         poor. Findings:      The perianal and digital rectal examinations were normal.      A 3 mm polyp was found in the hepatic flexure. The polyp was sessile.       The polyp was removed with a cold snare. Resection and retrieval were       complete. Estimated blood loss was minimal.      A 2 mm polyp was found in the descending colon. The polyp was sessile.       The  polyp was removed with a jumbo cold forceps. Resection and retrieval       were complete. Estimated blood loss was minimal.      A 10 mm polyp was found in the sigmoid colon. The polyp was       pedunculated. The polyp was removed with a hot snare. Resection and       retrieval were complete. Estimated blood loss was minimal.      Multiple small and large-mouthed diverticula were found in the sigmoid       colon.      The exam was otherwise without abnormality on direct and retroflexion       views. Impression:            - One 3 mm polyp at the hepatic flexure, removed with                         a cold snare. Resected and retrieved.                        - One 2 mm polyp in the descending colon, removed with                         a jumbo cold forceps. Resected and retrieved.                        - One 10 mm polyp in the sigmoid colon, removed with a                         hot snare. Resected and retrieved.                        - Diverticulosis in the sigmoid colon.                        - The examination was otherwise normal on direct and                         retroflexion views. Recommendation:        - Discharge patient to home.                        -  Resume previous diet.                        - Continue present medications.                        - Await pathology results.                        - Repeat colonoscopy date to be determined after                         pending pathology results are reviewed for                         surveillance. Will need to weigh risks/benefits of                         repeat colonoscopy given inadequate prep in cecum but                         no large lesions seen.                        - Return to referring physician as previously                         scheduled. Procedure Code(s):     --- Professional ---                        [phone], Colonoscopy, flexible; with removal of                         tumor(s), polyp(s), or  other lesion(s) by snare                         technique                        [id number], 22, Colonoscopy, flexible; with biopsy, single                         or multiple Diagnosis Code(s):     --- Professional ---                        K63.5, Polyp of colon                        R10.84, Generalized abdominal pain                        D50.9, Iron deficiency anemia, unspecified                        K57.30, Diverticulosis of large intestine without                         perforation or abscess without bleeding CPT copyright 2019 American Medical Association. All rights reserved. The codes documented in this report are preliminary and upon coder review may  be revised to meet current compliance requirements. Andrey Farmer MD, MD 02/20/2021 10:32:56  AM Number of Addenda: 0 Note Initiated On: 02/20/2021 9:31 AM Scope Withdrawal Time: 0 hours 13 minutes 29 seconds  Total Procedure Duration: 0 hours 22 minutes 59 seconds  Estimated Blood Loss:  Estimated blood loss was minimal.      [facility]

## 2021-02-20 NOTE — Interval H&P Note (Signed)
History and Physical Interval Note:  02/20/2021 9:43 AM  Cheryl Hall  has presented today for surgery, with the diagnosis of ABD PAIN IDA.  The various methods of treatment have been discussed with the patient and family. After consideration of risks, benefits and other options for treatment, the patient has consented to  Procedure(s): COLONOSCOPY WITH PROPOFOL (N/A) ESOPHAGOGASTRODUODENOSCOPY (EGD) (N/A) as a surgical intervention.  The patient's history has been reviewed, patient examined, no change in status, stable for surgery.  I have reviewed the patient's chart and labs.  Questions were answered to the patient's satisfaction.     Lesly Rubenstein  Ok to proceed with EGD/Colonoscopy

## 2021-02-20 NOTE — H&P (Signed)
Outpatient short stay form Pre-procedure 02/20/2021  Cheryl Rubenstein, MD  Primary Physician: Marinda Elk, MD  Reason for visit:  IDA  History of present illness:   80 y/o lady with history of IDA, abdominal pain, dyspepsia, CAD here for EGD/Colonoscopy. Had work-up for chronic mesenteric ischemia that was negative. Denies pain currently, mainly bloating now. Has constipation. Denies any blood thinners. No family history of colon or stomach cancer. Sister had pancreatic cancer.    Current Facility-Administered Medications:    0.9 %  sodium chloride infusion, , Intravenous, Continuous, Bandy Honaker, Hilton Cork, MD, Last Rate: 20 mL/hr at 02/20/21 0936, New Bag at 02/20/21 0936  Medications Prior to Admission  Medication Sig Dispense Refill Last Dose   amLODipine (NORVASC) 5 MG tablet Take 1 tablet by mouth once daily 90 tablet 1 02/20/2021   atorvastatin (LIPITOR) 80 MG tablet TAKE 1 TABLET BY MOUTH ONCE DAILY AT  6PM 90 tablet 3 02/20/2021   carvedilol (COREG) 6.25 MG tablet Take 1 tablet by mouth twice daily 180 tablet 3 02/20/2021   ferrous sulfate 325 (65 FE) MG tablet Take 325 mg by mouth daily.   02/19/2021   isosorbide mononitrate (IMDUR) 30 MG 24 hr tablet Take 1/2 (one-half) tablet by mouth once daily 45 tablet 1 02/20/2021   lisinopril (ZESTRIL) 40 MG tablet Take 1 tablet by mouth once daily 90 tablet 0 02/20/2021   predniSONE (DELTASONE) 10 MG tablet Take by mouth.   02/20/2021   traMADol-acetaminophen (ULTRACET) 37.5-325 MG tablet Take 0.5 tablets by mouth every 6 (six) hours as needed (pain).  0 Past Week   vitamin B-12 (CYANOCOBALAMIN) 1000 MCG tablet Take 1,000 mcg by mouth daily.   02/19/2021   acetaminophen (TYLENOL) 500 MG tablet Take 500 mg by mouth every 6 (six) hours as needed for mild pain or headache.      alendronate (FOSAMAX) 70 MG tablet Take 70 mg by mouth once a week.  11    aspirin EC 81 MG EC tablet Take 1 tablet (81 mg total) by mouth daily. 30 tablet 3  02/18/2021   Cholecalciferol (VITAMIN D-3 PO) Take 1,000 Units by mouth daily.       hydrochlorothiazide (HYDRODIURIL) 12.5 MG tablet Take 12.5 mg by mouth daily.       nitroGLYCERIN (NITROSTAT) 0.4 MG SL tablet Place 0.4 mg under the tongue every 5 (five) minutes as needed for chest pain.      pantoprazole (PROTONIX) 40 MG tablet Take 1 tablet (40 mg total) by mouth daily. 30 tablet 5      No Known Allergies   Past Medical History:  Diagnosis Date   Altered mental status    Anemia    Anxiety    [name] colitis    Chronic back pain    CKD (chronic kidney disease), stage II    Coronary artery disease    a. 09/2014 NSTEMI/Cath: moderate 2-vessel disease;  b. 09/2016 NSTEMI/Cath: LM 30, LAD 50p, 34m, LCX nl, RCA 62m, 50d, EF 55-65%.   Diverticulitis    Enthesopathy of hip region    Essential hypertension    GERD (gastroesophageal reflux disease)    H/O Vertebral Fractures    History of colonoscopy    History of echocardiogram    a. 09/2016 Echo: EF 55-60%, no rwma, mildly dil Ao root (3.7cm) and Asc Ao (3.9cm), mild to mod TR.   History of esophagogastroduodenoscopy (EGD)    History of mania    History of sciatica  History of tobacco abuse    Hyperlipidemia    Lumbago    Osteopetrosis    Peripheral neuropathy    Vitamin D deficiency     Review of systems:  Otherwise negative.    Physical Exam  Gen: Alert, oriented. Appears stated age.  HEENT: PERRLA. Lungs: No respiratory distress CV: RRR Abd: soft, benign, no masses Ext: No edema   Planned procedures: Proceed with EGD/colonoscopy. The patient understands the nature of the planned procedure, indications, risks, alternatives and potential complications including but not limited to bleeding, infection, perforation, damage to internal organs and possible oversedation/side effects from anesthesia. The patient agrees and gives consent to proceed.  Please refer to procedure notes for findings, recommendations and  patient disposition/instructions.     Cheryl Rubenstein, MD [facility]

## 2021-02-20 NOTE — Anesthesia Preprocedure Evaluation (Signed)
Anesthesia Evaluation  Patient identified by MRN, date of birth, ID band Patient awake    Reviewed: Allergy & Precautions, H&P , NPO status , Patient's Chart, lab work & pertinent test results, reviewed documented beta blocker date and time   Airway Mallampati: II   Neck ROM: full    Dental  (+) Poor Dentition, Teeth Intact   Pulmonary neg pulmonary ROS, Current Smoker and Patient abstained from smoking.,    Pulmonary exam normal        Cardiovascular Exercise Tolerance: Poor hypertension, On Medications + CAD and + Past MI  Normal cardiovascular exam Rhythm:regular Rate:Normal     Neuro/Psych Anxiety  Neuromuscular disease negative psych ROS   GI/Hepatic Neg liver ROS, GERD  Medicated,  Endo/Other  negative endocrine ROS  Renal/GU Renal disease  negative genitourinary   Musculoskeletal   Abdominal   Peds  Hematology  (+) Blood dyscrasia, anemia ,   Anesthesia Other Findings Past Medical History: No date: Altered mental status No date: Anemia No date: Anxiety No date: [name] colitis No date: Chronic back pain No date: CKD (chronic kidney disease), stage II No date: Coronary artery disease     Comment:  a. 09/2014 NSTEMI/Cath: moderate 2-vessel disease;  b.               09/2016 NSTEMI/Cath: LM 30, LAD 50p, 87m, LCX nl, RCA 51m,              50d, EF 55-65%. No date: Diverticulitis No date: Enthesopathy of hip region No date: Essential hypertension No date: GERD (gastroesophageal reflux disease) No date: H/O Vertebral Fractures No date: History of colonoscopy No date: History of echocardiogram     Comment:  a. 09/2016 Echo: EF 55-60%, no rwma, mildly dil Ao root               (3.7cm) and Asc Ao (3.9cm), mild to mod TR. No date: History of esophagogastroduodenoscopy (EGD) No date: History of mania No date: History of sciatica No date: History of tobacco abuse No date: Hyperlipidemia No date:  Lumbago No date: Osteopetrosis No date: Peripheral neuropathy No date: Vitamin D deficiency Past Surgical History: 10/21/2014: CARDIAC CATHETERIZATION; N/A     Comment:  Procedure: Left Heart Cath and Coronary Angiography;                Surgeon: Wellington Hampshire, MD;  Location: [place]               CV LAB;  Service: Cardiovascular;  Laterality: N/A; 10/21/2014: CARDIAC CATHETERIZATION     Comment:  Dr. Fletcher Anon No date: CHOLECYSTECTOMY 10/09/2016: LEFT HEART CATH AND CORONARY ANGIOGRAPHY; N/A     Comment:  Procedure: Left Heart Cath and Coronary Angiography;                Surgeon: Nelva Bush, MD;  Location: [place]               CV LAB;  Service: Cardiovascular;  Laterality: N/A; 08/17/2019: LEFT HEART CATH AND CORONARY ANGIOGRAPHY; N/A     Comment:  Procedure: LEFT HEART CATH AND CORONARY ANGIOGRAPHY;                Surgeon: Wellington Hampshire, MD;  Location: [place]               CV LAB;  Service: Cardiovascular;  Laterality: N/A; 07/28/2020: VISCERAL ANGIOGRAPHY; N/A     Comment:  Procedure: VISCERAL ANGIOGRAPHY;  Surgeon: Algernon Huxley,  MD;  Location: [place] CV LAB;  Service:               Cardiovascular;  Laterality: N/A; 09/29/2020: VISCERAL ANGIOGRAPHY; N/A     Comment:  Procedure: VISCERAL ANGIOGRAPHY;  Surgeon: Algernon Huxley,              MD;  Location: [place] CV LAB;  Service:               Cardiovascular;  Laterality: N/A; BMI    Body Mass Index: 21.77 kg/m     Reproductive/Obstetrics negative OB ROS                             Anesthesia Physical Anesthesia Plan  ASA: 3  Anesthesia Plan: General   Post-op Pain Management:    Induction:   PONV Risk Score and Plan:   Airway Management Planned:   Additional Equipment:   Intra-op Plan:   Post-operative Plan:   Informed Consent: I have reviewed the patients History and Physical, chart, labs and discussed the procedure including the risks,  benefits and alternatives for the proposed anesthesia with the patient or authorized representative who has indicated his/her understanding and acceptance.     Dental Advisory Given  Plan Discussed with: CRNA  Anesthesia Plan Comments:         Anesthesia Quick Evaluation

## 2021-02-21 ENCOUNTER — Encounter: Payer: Self-pay | Admitting: Gastroenterology

## 2021-02-21 LAB — SURGICAL PATHOLOGY

## 2021-02-21 NOTE — Anesthesia Postprocedure Evaluation (Signed)
Anesthesia Post Note  Patient: Cheryl Hall  Procedure(s) Performed: COLONOSCOPY WITH PROPOFOL ESOPHAGOGASTRODUODENOSCOPY (EGD)  Patient location during evaluation: PACU Anesthesia Type: General Level of consciousness: awake and alert Pain management: pain level controlled Vital Signs Assessment: post-procedure vital signs reviewed and stable Respiratory status: spontaneous breathing, nonlabored ventilation, respiratory function stable and patient connected to nasal cannula oxygen Cardiovascular status: blood pressure returned to baseline and stable Postop Assessment: no apparent nausea or vomiting Anesthetic complications: no   No notable events documented.   Last Vitals:  Vitals:   02/20/21 1049 02/20/21 1059  BP: (!) 118/56 (!) 134/57  Pulse: 60 64  Resp: 13 14  Temp:    SpO2: 96% 93%    Last Pain:  Vitals:   02/20/21 1059  TempSrc:   PainSc: 0-No pain                 Molli Barrows

## 2021-03-12 ENCOUNTER — Other Ambulatory Visit: Payer: Self-pay | Admitting: Cardiovascular Disease

## 2021-03-15 DIAGNOSIS — R930 Abnormal findings on diagnostic imaging of skull and head, not elsewhere classified: Secondary | ICD-10-CM | POA: Diagnosis not present

## 2021-03-15 DIAGNOSIS — S22059A Unspecified fracture of T5-T6 vertebra, initial encounter for closed fracture: Secondary | ICD-10-CM | POA: Diagnosis not present

## 2021-03-15 DIAGNOSIS — M542 Cervicalgia: Secondary | ICD-10-CM | POA: Diagnosis not present

## 2021-03-15 DIAGNOSIS — S6992XA Unspecified injury of left wrist, hand and finger(s), initial encounter: Secondary | ICD-10-CM | POA: Diagnosis not present

## 2021-03-15 DIAGNOSIS — N838 Other noninflammatory disorders of ovary, fallopian tube and broad ligament: Secondary | ICD-10-CM | POA: Diagnosis not present

## 2021-03-15 DIAGNOSIS — R454 Irritability and anger: Secondary | ICD-10-CM | POA: Diagnosis not present

## 2021-03-15 DIAGNOSIS — S2222XA Fracture of body of sternum, initial encounter for closed fracture: Secondary | ICD-10-CM | POA: Diagnosis not present

## 2021-03-15 DIAGNOSIS — J9811 Atelectasis: Secondary | ICD-10-CM | POA: Diagnosis not present

## 2021-03-15 DIAGNOSIS — S60512A Abrasion of left hand, initial encounter: Secondary | ICD-10-CM | POA: Diagnosis not present

## 2021-03-15 DIAGNOSIS — R0689 Other abnormalities of breathing: Secondary | ICD-10-CM | POA: Diagnosis not present

## 2021-03-15 DIAGNOSIS — Z9981 Dependence on supplemental oxygen: Secondary | ICD-10-CM | POA: Diagnosis not present

## 2021-03-15 DIAGNOSIS — S299XXA Unspecified injury of thorax, initial encounter: Secondary | ICD-10-CM | POA: Diagnosis not present

## 2021-03-15 DIAGNOSIS — S3282XA Multiple fractures of pelvis without disruption of pelvic ring, initial encounter for closed fracture: Secondary | ICD-10-CM | POA: Diagnosis not present

## 2021-03-15 DIAGNOSIS — S29009A Unspecified injury of muscle and tendon of unspecified wall of thorax, initial encounter: Secondary | ICD-10-CM | POA: Diagnosis not present

## 2021-03-15 DIAGNOSIS — M47816 Spondylosis without myelopathy or radiculopathy, lumbar region: Secondary | ICD-10-CM | POA: Diagnosis not present

## 2021-03-15 DIAGNOSIS — M79602 Pain in left arm: Secondary | ICD-10-CM | POA: Diagnosis not present

## 2021-03-15 DIAGNOSIS — Z20822 Contact with and (suspected) exposure to covid-19: Secondary | ICD-10-CM | POA: Diagnosis not present

## 2021-03-15 DIAGNOSIS — S12100A Unspecified displaced fracture of second cervical vertebra, initial encounter for closed fracture: Secondary | ICD-10-CM | POA: Diagnosis not present

## 2021-03-15 DIAGNOSIS — I959 Hypotension, unspecified: Secondary | ICD-10-CM | POA: Diagnosis not present

## 2021-03-15 DIAGNOSIS — S3993XA Unspecified injury of pelvis, initial encounter: Secondary | ICD-10-CM | POA: Diagnosis not present

## 2021-03-15 DIAGNOSIS — E119 Type 2 diabetes mellitus without complications: Secondary | ICD-10-CM | POA: Diagnosis not present

## 2021-03-15 DIAGNOSIS — R079 Chest pain, unspecified: Secondary | ICD-10-CM | POA: Diagnosis not present

## 2021-03-15 DIAGNOSIS — J432 Centrilobular emphysema: Secondary | ICD-10-CM | POA: Diagnosis not present

## 2021-03-15 DIAGNOSIS — S12200A Unspecified displaced fracture of third cervical vertebra, initial encounter for closed fracture: Secondary | ICD-10-CM | POA: Diagnosis not present

## 2021-03-15 DIAGNOSIS — S1083XA Contusion of other specified part of neck, initial encounter: Secondary | ICD-10-CM | POA: Diagnosis not present

## 2021-03-15 DIAGNOSIS — N2889 Other specified disorders of kidney and ureter: Secondary | ICD-10-CM | POA: Diagnosis not present

## 2021-03-15 DIAGNOSIS — S12101A Unspecified nondisplaced fracture of second cervical vertebra, initial encounter for closed fracture: Secondary | ICD-10-CM | POA: Diagnosis not present

## 2021-03-15 DIAGNOSIS — R34 Anuria and oliguria: Secondary | ICD-10-CM | POA: Diagnosis not present

## 2021-03-15 DIAGNOSIS — G479 Sleep disorder, unspecified: Secondary | ICD-10-CM | POA: Diagnosis not present

## 2021-03-15 DIAGNOSIS — S12290A Other displaced fracture of third cervical vertebra, initial encounter for closed fracture: Secondary | ICD-10-CM | POA: Diagnosis not present

## 2021-03-15 DIAGNOSIS — S22069A Unspecified fracture of T7-T8 vertebra, initial encounter for closed fracture: Secondary | ICD-10-CM | POA: Diagnosis not present

## 2021-03-15 DIAGNOSIS — S40012A Contusion of left shoulder, initial encounter: Secondary | ICD-10-CM | POA: Diagnosis not present

## 2021-03-15 DIAGNOSIS — S29001A Unspecified injury of muscle and tendon of front wall of thorax, initial encounter: Secondary | ICD-10-CM | POA: Diagnosis not present

## 2021-03-15 DIAGNOSIS — M5385 Other specified dorsopathies, thoracolumbar region: Secondary | ICD-10-CM | POA: Diagnosis not present

## 2021-03-15 DIAGNOSIS — K838 Other specified diseases of biliary tract: Secondary | ICD-10-CM | POA: Diagnosis not present

## 2021-03-15 DIAGNOSIS — R0600 Dyspnea, unspecified: Secondary | ICD-10-CM | POA: Diagnosis not present

## 2021-03-15 DIAGNOSIS — R0789 Other chest pain: Secondary | ICD-10-CM | POA: Diagnosis not present

## 2021-03-15 DIAGNOSIS — I10 Essential (primary) hypertension: Secondary | ICD-10-CM | POA: Diagnosis not present

## 2021-03-15 DIAGNOSIS — M79643 Pain in unspecified hand: Secondary | ICD-10-CM | POA: Diagnosis not present

## 2021-03-15 DIAGNOSIS — Z720 Tobacco use: Secondary | ICD-10-CM | POA: Diagnosis not present

## 2021-03-15 DIAGNOSIS — R0902 Hypoxemia: Secondary | ICD-10-CM | POA: Diagnosis not present

## 2021-03-15 DIAGNOSIS — G8911 Acute pain due to trauma: Secondary | ICD-10-CM | POA: Diagnosis not present

## 2021-03-15 DIAGNOSIS — S32591A Other specified fracture of right pubis, initial encounter for closed fracture: Secondary | ICD-10-CM | POA: Diagnosis not present

## 2021-03-15 DIAGNOSIS — S12300A Unspecified displaced fracture of fourth cervical vertebra, initial encounter for closed fracture: Secondary | ICD-10-CM | POA: Diagnosis not present

## 2021-03-15 DIAGNOSIS — N390 Urinary tract infection, site not specified: Secondary | ICD-10-CM | POA: Diagnosis not present

## 2021-03-15 DIAGNOSIS — S60812A Abrasion of left wrist, initial encounter: Secondary | ICD-10-CM | POA: Diagnosis not present

## 2021-03-15 DIAGNOSIS — S32592A Other specified fracture of left pubis, initial encounter for closed fracture: Secondary | ICD-10-CM | POA: Diagnosis not present

## 2021-03-15 DIAGNOSIS — S12191A Other nondisplaced fracture of second cervical vertebra, initial encounter for closed fracture: Secondary | ICD-10-CM | POA: Diagnosis not present

## 2021-03-15 DIAGNOSIS — R918 Other nonspecific abnormal finding of lung field: Secondary | ICD-10-CM | POA: Diagnosis not present

## 2021-03-15 DIAGNOSIS — S12190A Other displaced fracture of second cervical vertebra, initial encounter for closed fracture: Secondary | ICD-10-CM | POA: Diagnosis not present

## 2021-03-15 DIAGNOSIS — J90 Pleural effusion, not elsewhere classified: Secondary | ICD-10-CM | POA: Diagnosis not present

## 2021-03-15 DIAGNOSIS — E278 Other specified disorders of adrenal gland: Secondary | ICD-10-CM | POA: Diagnosis not present

## 2021-03-15 DIAGNOSIS — K551 Chronic vascular disorders of intestine: Secondary | ICD-10-CM | POA: Diagnosis not present

## 2021-03-15 DIAGNOSIS — Z041 Encounter for examination and observation following transport accident: Secondary | ICD-10-CM | POA: Diagnosis not present

## 2021-03-15 DIAGNOSIS — S2220XA Unspecified fracture of sternum, initial encounter for closed fracture: Secondary | ICD-10-CM | POA: Diagnosis not present

## 2021-03-15 DIAGNOSIS — S2243XA Multiple fractures of ribs, bilateral, initial encounter for closed fracture: Secondary | ICD-10-CM | POA: Diagnosis not present

## 2021-03-15 DIAGNOSIS — N182 Chronic kidney disease, stage 2 (mild): Secondary | ICD-10-CM | POA: Diagnosis not present

## 2021-03-15 DIAGNOSIS — S1093XA Contusion of unspecified part of neck, initial encounter: Secondary | ICD-10-CM | POA: Diagnosis not present

## 2021-03-15 DIAGNOSIS — I491 Atrial premature depolarization: Secondary | ICD-10-CM | POA: Diagnosis not present

## 2021-03-15 DIAGNOSIS — R410 Disorientation, unspecified: Secondary | ICD-10-CM | POA: Diagnosis not present

## 2021-03-15 DIAGNOSIS — S62112A Displaced fracture of triquetrum [cuneiform] bone, left wrist, initial encounter for closed fracture: Secondary | ICD-10-CM | POA: Diagnosis not present

## 2021-03-15 DIAGNOSIS — R451 Restlessness and agitation: Secondary | ICD-10-CM | POA: Diagnosis not present

## 2021-03-15 DIAGNOSIS — R937 Abnormal findings on diagnostic imaging of other parts of musculoskeletal system: Secondary | ICD-10-CM | POA: Diagnosis not present

## 2021-03-15 DIAGNOSIS — M549 Dorsalgia, unspecified: Secondary | ICD-10-CM | POA: Diagnosis not present

## 2021-03-15 DIAGNOSIS — F69 Unspecified disorder of adult personality and behavior: Secondary | ICD-10-CM | POA: Diagnosis not present

## 2021-03-15 DIAGNOSIS — S6292XA Unspecified fracture of left wrist and hand, initial encounter for closed fracture: Secondary | ICD-10-CM | POA: Diagnosis not present

## 2021-03-15 DIAGNOSIS — F05 Delirium due to known physiological condition: Secondary | ICD-10-CM | POA: Diagnosis not present

## 2021-03-15 DIAGNOSIS — R062 Wheezing: Secondary | ICD-10-CM | POA: Diagnosis not present

## 2021-03-15 DIAGNOSIS — K8689 Other specified diseases of pancreas: Secondary | ICD-10-CM | POA: Diagnosis not present

## 2021-03-15 DIAGNOSIS — S2241XA Multiple fractures of ribs, right side, initial encounter for closed fracture: Secondary | ICD-10-CM | POA: Diagnosis not present

## 2021-03-15 DIAGNOSIS — R338 Other retention of urine: Secondary | ICD-10-CM | POA: Diagnosis not present

## 2021-03-16 DIAGNOSIS — R0789 Other chest pain: Secondary | ICD-10-CM | POA: Diagnosis not present

## 2021-03-16 DIAGNOSIS — S6292XA Unspecified fracture of left wrist and hand, initial encounter for closed fracture: Secondary | ICD-10-CM | POA: Diagnosis not present

## 2021-03-16 DIAGNOSIS — S2241XA Multiple fractures of ribs, right side, initial encounter for closed fracture: Secondary | ICD-10-CM | POA: Diagnosis not present

## 2021-03-16 DIAGNOSIS — R0689 Other abnormalities of breathing: Secondary | ICD-10-CM | POA: Diagnosis not present

## 2021-03-16 DIAGNOSIS — N182 Chronic kidney disease, stage 2 (mild): Secondary | ICD-10-CM | POA: Diagnosis not present

## 2021-03-16 DIAGNOSIS — I10 Essential (primary) hypertension: Secondary | ICD-10-CM | POA: Diagnosis not present

## 2021-03-16 DIAGNOSIS — S29001A Unspecified injury of muscle and tendon of front wall of thorax, initial encounter: Secondary | ICD-10-CM | POA: Diagnosis not present

## 2021-03-17 DIAGNOSIS — S2241XA Multiple fractures of ribs, right side, initial encounter for closed fracture: Secondary | ICD-10-CM | POA: Diagnosis not present

## 2021-03-17 DIAGNOSIS — S2222XA Fracture of body of sternum, initial encounter for closed fracture: Secondary | ICD-10-CM | POA: Diagnosis not present

## 2021-03-17 DIAGNOSIS — J432 Centrilobular emphysema: Secondary | ICD-10-CM | POA: Diagnosis not present

## 2021-03-17 DIAGNOSIS — S12190A Other displaced fracture of second cervical vertebra, initial encounter for closed fracture: Secondary | ICD-10-CM | POA: Diagnosis not present

## 2021-03-18 DIAGNOSIS — J90 Pleural effusion, not elsewhere classified: Secondary | ICD-10-CM | POA: Diagnosis not present

## 2021-03-18 DIAGNOSIS — S2241XA Multiple fractures of ribs, right side, initial encounter for closed fracture: Secondary | ICD-10-CM | POA: Diagnosis not present

## 2021-03-18 DIAGNOSIS — I10 Essential (primary) hypertension: Secondary | ICD-10-CM | POA: Diagnosis not present

## 2021-03-18 DIAGNOSIS — R0902 Hypoxemia: Secondary | ICD-10-CM | POA: Diagnosis not present

## 2021-03-18 DIAGNOSIS — S12190A Other displaced fracture of second cervical vertebra, initial encounter for closed fracture: Secondary | ICD-10-CM | POA: Diagnosis not present

## 2021-03-18 DIAGNOSIS — J432 Centrilobular emphysema: Secondary | ICD-10-CM | POA: Diagnosis not present

## 2021-03-18 DIAGNOSIS — R918 Other nonspecific abnormal finding of lung field: Secondary | ICD-10-CM | POA: Diagnosis not present

## 2021-03-18 DIAGNOSIS — S2222XA Fracture of body of sternum, initial encounter for closed fracture: Secondary | ICD-10-CM | POA: Diagnosis not present

## 2021-03-19 DIAGNOSIS — I491 Atrial premature depolarization: Secondary | ICD-10-CM | POA: Diagnosis not present

## 2021-03-19 DIAGNOSIS — S2222XA Fracture of body of sternum, initial encounter for closed fracture: Secondary | ICD-10-CM | POA: Diagnosis not present

## 2021-03-19 DIAGNOSIS — R454 Irritability and anger: Secondary | ICD-10-CM | POA: Diagnosis not present

## 2021-03-19 DIAGNOSIS — S29009A Unspecified injury of muscle and tendon of unspecified wall of thorax, initial encounter: Secondary | ICD-10-CM | POA: Diagnosis not present

## 2021-03-19 DIAGNOSIS — Z720 Tobacco use: Secondary | ICD-10-CM | POA: Diagnosis not present

## 2021-03-19 DIAGNOSIS — N390 Urinary tract infection, site not specified: Secondary | ICD-10-CM | POA: Diagnosis not present

## 2021-03-19 DIAGNOSIS — R451 Restlessness and agitation: Secondary | ICD-10-CM | POA: Diagnosis not present

## 2021-03-19 DIAGNOSIS — R410 Disorientation, unspecified: Secondary | ICD-10-CM | POA: Diagnosis not present

## 2021-03-19 DIAGNOSIS — S2241XA Multiple fractures of ribs, right side, initial encounter for closed fracture: Secondary | ICD-10-CM | POA: Diagnosis not present

## 2021-03-19 DIAGNOSIS — S12190A Other displaced fracture of second cervical vertebra, initial encounter for closed fracture: Secondary | ICD-10-CM | POA: Diagnosis not present

## 2021-03-19 DIAGNOSIS — G479 Sleep disorder, unspecified: Secondary | ICD-10-CM | POA: Diagnosis not present

## 2021-03-20 DIAGNOSIS — S12190A Other displaced fracture of second cervical vertebra, initial encounter for closed fracture: Secondary | ICD-10-CM | POA: Diagnosis not present

## 2021-03-20 DIAGNOSIS — R410 Disorientation, unspecified: Secondary | ICD-10-CM | POA: Insufficient documentation

## 2021-03-20 DIAGNOSIS — S2222XA Fracture of body of sternum, initial encounter for closed fracture: Secondary | ICD-10-CM | POA: Diagnosis not present

## 2021-03-20 DIAGNOSIS — I10 Essential (primary) hypertension: Secondary | ICD-10-CM | POA: Diagnosis not present

## 2021-03-20 DIAGNOSIS — F05 Delirium due to known physiological condition: Secondary | ICD-10-CM | POA: Diagnosis not present

## 2021-03-20 DIAGNOSIS — S2241XA Multiple fractures of ribs, right side, initial encounter for closed fracture: Secondary | ICD-10-CM | POA: Diagnosis not present

## 2021-03-20 DIAGNOSIS — Z9981 Dependence on supplemental oxygen: Secondary | ICD-10-CM | POA: Diagnosis not present

## 2021-03-20 DIAGNOSIS — N390 Urinary tract infection, site not specified: Secondary | ICD-10-CM | POA: Diagnosis not present

## 2021-03-20 DIAGNOSIS — R451 Restlessness and agitation: Secondary | ICD-10-CM | POA: Diagnosis not present

## 2021-03-21 DIAGNOSIS — S12190A Other displaced fracture of second cervical vertebra, initial encounter for closed fracture: Secondary | ICD-10-CM | POA: Diagnosis not present

## 2021-03-21 DIAGNOSIS — R079 Chest pain, unspecified: Secondary | ICD-10-CM | POA: Diagnosis not present

## 2021-03-21 DIAGNOSIS — S2241XA Multiple fractures of ribs, right side, initial encounter for closed fracture: Secondary | ICD-10-CM | POA: Diagnosis not present

## 2021-03-21 DIAGNOSIS — R338 Other retention of urine: Secondary | ICD-10-CM | POA: Diagnosis not present

## 2021-03-21 DIAGNOSIS — N390 Urinary tract infection, site not specified: Secondary | ICD-10-CM | POA: Diagnosis not present

## 2021-03-21 DIAGNOSIS — J90 Pleural effusion, not elsewhere classified: Secondary | ICD-10-CM | POA: Diagnosis not present

## 2021-03-21 DIAGNOSIS — J9811 Atelectasis: Secondary | ICD-10-CM | POA: Diagnosis not present

## 2021-03-21 DIAGNOSIS — F05 Delirium due to known physiological condition: Secondary | ICD-10-CM | POA: Diagnosis not present

## 2021-03-21 DIAGNOSIS — I10 Essential (primary) hypertension: Secondary | ICD-10-CM | POA: Diagnosis not present

## 2021-03-22 DIAGNOSIS — S2241XA Multiple fractures of ribs, right side, initial encounter for closed fracture: Secondary | ICD-10-CM | POA: Diagnosis not present

## 2021-03-22 DIAGNOSIS — J90 Pleural effusion, not elsewhere classified: Secondary | ICD-10-CM | POA: Diagnosis not present

## 2021-03-22 DIAGNOSIS — N390 Urinary tract infection, site not specified: Secondary | ICD-10-CM | POA: Diagnosis not present

## 2021-03-22 DIAGNOSIS — S12101A Unspecified nondisplaced fracture of second cervical vertebra, initial encounter for closed fracture: Secondary | ICD-10-CM | POA: Diagnosis not present

## 2021-03-22 DIAGNOSIS — F05 Delirium due to known physiological condition: Secondary | ICD-10-CM | POA: Diagnosis not present

## 2021-03-22 DIAGNOSIS — Z9981 Dependence on supplemental oxygen: Secondary | ICD-10-CM | POA: Diagnosis not present

## 2021-03-22 DIAGNOSIS — I10 Essential (primary) hypertension: Secondary | ICD-10-CM | POA: Diagnosis not present

## 2021-03-23 DIAGNOSIS — J9811 Atelectasis: Secondary | ICD-10-CM | POA: Diagnosis not present

## 2021-03-23 DIAGNOSIS — I10 Essential (primary) hypertension: Secondary | ICD-10-CM | POA: Diagnosis not present

## 2021-03-23 DIAGNOSIS — N390 Urinary tract infection, site not specified: Secondary | ICD-10-CM | POA: Diagnosis not present

## 2021-03-23 DIAGNOSIS — S12191A Other nondisplaced fracture of second cervical vertebra, initial encounter for closed fracture: Secondary | ICD-10-CM | POA: Diagnosis not present

## 2021-03-23 DIAGNOSIS — F05 Delirium due to known physiological condition: Secondary | ICD-10-CM | POA: Diagnosis not present

## 2021-03-23 DIAGNOSIS — S2241XA Multiple fractures of ribs, right side, initial encounter for closed fracture: Secondary | ICD-10-CM | POA: Diagnosis not present

## 2021-03-23 DIAGNOSIS — J90 Pleural effusion, not elsewhere classified: Secondary | ICD-10-CM | POA: Diagnosis not present

## 2021-03-24 DIAGNOSIS — R410 Disorientation, unspecified: Secondary | ICD-10-CM | POA: Diagnosis not present

## 2021-03-24 DIAGNOSIS — R451 Restlessness and agitation: Secondary | ICD-10-CM | POA: Diagnosis not present

## 2021-03-24 DIAGNOSIS — I10 Essential (primary) hypertension: Secondary | ICD-10-CM | POA: Diagnosis not present

## 2021-03-24 DIAGNOSIS — R918 Other nonspecific abnormal finding of lung field: Secondary | ICD-10-CM | POA: Diagnosis not present

## 2021-03-24 DIAGNOSIS — Z9981 Dependence on supplemental oxygen: Secondary | ICD-10-CM | POA: Diagnosis not present

## 2021-03-24 DIAGNOSIS — J90 Pleural effusion, not elsewhere classified: Secondary | ICD-10-CM | POA: Diagnosis not present

## 2021-03-24 DIAGNOSIS — R937 Abnormal findings on diagnostic imaging of other parts of musculoskeletal system: Secondary | ICD-10-CM | POA: Diagnosis not present

## 2021-03-24 DIAGNOSIS — S12191A Other nondisplaced fracture of second cervical vertebra, initial encounter for closed fracture: Secondary | ICD-10-CM | POA: Diagnosis not present

## 2021-03-24 DIAGNOSIS — F05 Delirium due to known physiological condition: Secondary | ICD-10-CM | POA: Diagnosis not present

## 2021-03-25 DIAGNOSIS — I10 Essential (primary) hypertension: Secondary | ICD-10-CM | POA: Diagnosis not present

## 2021-03-25 DIAGNOSIS — J90 Pleural effusion, not elsewhere classified: Secondary | ICD-10-CM | POA: Diagnosis not present

## 2021-03-25 DIAGNOSIS — R918 Other nonspecific abnormal finding of lung field: Secondary | ICD-10-CM | POA: Diagnosis not present

## 2021-03-25 DIAGNOSIS — S12191A Other nondisplaced fracture of second cervical vertebra, initial encounter for closed fracture: Secondary | ICD-10-CM | POA: Diagnosis not present

## 2021-03-25 DIAGNOSIS — S12190A Other displaced fracture of second cervical vertebra, initial encounter for closed fracture: Secondary | ICD-10-CM | POA: Diagnosis not present

## 2021-03-25 DIAGNOSIS — F05 Delirium due to known physiological condition: Secondary | ICD-10-CM | POA: Diagnosis not present

## 2021-03-25 DIAGNOSIS — S2241XA Multiple fractures of ribs, right side, initial encounter for closed fracture: Secondary | ICD-10-CM | POA: Diagnosis not present

## 2021-03-26 DIAGNOSIS — R451 Restlessness and agitation: Secondary | ICD-10-CM | POA: Diagnosis not present

## 2021-03-26 DIAGNOSIS — Z9981 Dependence on supplemental oxygen: Secondary | ICD-10-CM | POA: Diagnosis not present

## 2021-03-26 DIAGNOSIS — Z720 Tobacco use: Secondary | ICD-10-CM | POA: Diagnosis not present

## 2021-03-26 DIAGNOSIS — S2241XA Multiple fractures of ribs, right side, initial encounter for closed fracture: Secondary | ICD-10-CM | POA: Diagnosis not present

## 2021-03-26 DIAGNOSIS — F69 Unspecified disorder of adult personality and behavior: Secondary | ICD-10-CM | POA: Diagnosis not present

## 2021-03-26 DIAGNOSIS — R0600 Dyspnea, unspecified: Secondary | ICD-10-CM | POA: Diagnosis not present

## 2021-03-26 DIAGNOSIS — R918 Other nonspecific abnormal finding of lung field: Secondary | ICD-10-CM | POA: Diagnosis not present

## 2021-03-26 DIAGNOSIS — J90 Pleural effusion, not elsewhere classified: Secondary | ICD-10-CM | POA: Diagnosis not present

## 2021-03-26 DIAGNOSIS — F05 Delirium due to known physiological condition: Secondary | ICD-10-CM | POA: Diagnosis not present

## 2021-03-26 DIAGNOSIS — I10 Essential (primary) hypertension: Secondary | ICD-10-CM | POA: Diagnosis not present

## 2021-03-27 DIAGNOSIS — Z9981 Dependence on supplemental oxygen: Secondary | ICD-10-CM | POA: Diagnosis not present

## 2021-03-27 DIAGNOSIS — N390 Urinary tract infection, site not specified: Secondary | ICD-10-CM | POA: Diagnosis not present

## 2021-03-27 DIAGNOSIS — F05 Delirium due to known physiological condition: Secondary | ICD-10-CM | POA: Diagnosis not present

## 2021-03-27 DIAGNOSIS — R918 Other nonspecific abnormal finding of lung field: Secondary | ICD-10-CM | POA: Diagnosis not present

## 2021-03-27 DIAGNOSIS — G8911 Acute pain due to trauma: Secondary | ICD-10-CM | POA: Diagnosis not present

## 2021-03-27 DIAGNOSIS — S2241XA Multiple fractures of ribs, right side, initial encounter for closed fracture: Secondary | ICD-10-CM | POA: Diagnosis not present

## 2021-03-27 DIAGNOSIS — J90 Pleural effusion, not elsewhere classified: Secondary | ICD-10-CM | POA: Diagnosis not present

## 2021-03-28 DIAGNOSIS — R34 Anuria and oliguria: Secondary | ICD-10-CM | POA: Diagnosis not present

## 2021-03-28 DIAGNOSIS — S2241XA Multiple fractures of ribs, right side, initial encounter for closed fracture: Secondary | ICD-10-CM | POA: Diagnosis not present

## 2021-03-28 DIAGNOSIS — F05 Delirium due to known physiological condition: Secondary | ICD-10-CM | POA: Diagnosis not present

## 2021-03-28 DIAGNOSIS — G8911 Acute pain due to trauma: Secondary | ICD-10-CM | POA: Diagnosis not present

## 2021-03-29 DIAGNOSIS — I959 Hypotension, unspecified: Secondary | ICD-10-CM | POA: Diagnosis not present

## 2021-03-29 DIAGNOSIS — S2241XA Multiple fractures of ribs, right side, initial encounter for closed fracture: Secondary | ICD-10-CM | POA: Diagnosis not present

## 2021-03-29 DIAGNOSIS — F05 Delirium due to known physiological condition: Secondary | ICD-10-CM | POA: Diagnosis not present

## 2021-03-29 DIAGNOSIS — Z9981 Dependence on supplemental oxygen: Secondary | ICD-10-CM | POA: Diagnosis not present

## 2021-03-30 DIAGNOSIS — E119 Type 2 diabetes mellitus without complications: Secondary | ICD-10-CM | POA: Diagnosis not present

## 2021-03-30 DIAGNOSIS — Z9981 Dependence on supplemental oxygen: Secondary | ICD-10-CM | POA: Diagnosis not present

## 2021-03-30 DIAGNOSIS — R451 Restlessness and agitation: Secondary | ICD-10-CM | POA: Diagnosis not present

## 2021-03-30 DIAGNOSIS — F05 Delirium due to known physiological condition: Secondary | ICD-10-CM | POA: Diagnosis not present

## 2021-03-30 DIAGNOSIS — I10 Essential (primary) hypertension: Secondary | ICD-10-CM | POA: Diagnosis not present

## 2021-03-30 DIAGNOSIS — S2241XA Multiple fractures of ribs, right side, initial encounter for closed fracture: Secondary | ICD-10-CM | POA: Diagnosis not present

## 2021-03-30 DIAGNOSIS — Z720 Tobacco use: Secondary | ICD-10-CM | POA: Diagnosis not present

## 2021-03-30 DIAGNOSIS — R410 Disorientation, unspecified: Secondary | ICD-10-CM | POA: Diagnosis not present

## 2021-03-31 DIAGNOSIS — S12190A Other displaced fracture of second cervical vertebra, initial encounter for closed fracture: Secondary | ICD-10-CM | POA: Diagnosis not present

## 2021-03-31 DIAGNOSIS — I10 Essential (primary) hypertension: Secondary | ICD-10-CM | POA: Diagnosis not present

## 2021-03-31 DIAGNOSIS — S2241XA Multiple fractures of ribs, right side, initial encounter for closed fracture: Secondary | ICD-10-CM | POA: Diagnosis not present

## 2021-03-31 DIAGNOSIS — G8911 Acute pain due to trauma: Secondary | ICD-10-CM | POA: Diagnosis not present

## 2021-04-01 DIAGNOSIS — Z9981 Dependence on supplemental oxygen: Secondary | ICD-10-CM | POA: Diagnosis not present

## 2021-04-01 DIAGNOSIS — S2241XA Multiple fractures of ribs, right side, initial encounter for closed fracture: Secondary | ICD-10-CM | POA: Diagnosis not present

## 2021-04-01 DIAGNOSIS — I10 Essential (primary) hypertension: Secondary | ICD-10-CM | POA: Diagnosis not present

## 2021-04-01 DIAGNOSIS — S12190A Other displaced fracture of second cervical vertebra, initial encounter for closed fracture: Secondary | ICD-10-CM | POA: Diagnosis not present

## 2021-04-02 DIAGNOSIS — Z9981 Dependence on supplemental oxygen: Secondary | ICD-10-CM | POA: Diagnosis not present

## 2021-04-02 DIAGNOSIS — S12190A Other displaced fracture of second cervical vertebra, initial encounter for closed fracture: Secondary | ICD-10-CM | POA: Diagnosis not present

## 2021-04-02 DIAGNOSIS — I10 Essential (primary) hypertension: Secondary | ICD-10-CM | POA: Diagnosis not present

## 2021-04-02 DIAGNOSIS — S2241XA Multiple fractures of ribs, right side, initial encounter for closed fracture: Secondary | ICD-10-CM | POA: Diagnosis not present

## 2021-04-11 ENCOUNTER — Telehealth: Payer: Self-pay | Admitting: Cardiovascular Disease
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
## 2021-04-13 NOTE — Telephone Encounter (Signed)
I agree with you that they probably stop some of her medications due to hypotension.  I suggest to continue current dose of carvedilol and monitor blood pressure 2-3 times per week.  Schedule office follow-up with an APP in 1 to 2 weeks to see if we need to resume some of the medications.

## 2021-04-13 NOTE — Telephone Encounter (Signed)
Spoke with the patients daughter in [employer]. Cheryl Hall made aware of Dr. Tyrell Antonio response and recommendation. Patient sts that she is currently wearing a cervical collar and she would like to wait until she sees her orthopedic surgeon on 05/05/21. Appt scheduled on 05/08/21 @ 2:45 pm with Cheryl Faith, PA. [facility] will [facility] this [facility]. Talbert Cage to monitor the patients BP as recommended. Patient is to contact the office if her BP readings is consistently low of high. Patient and Eustaquio Maize agreeable with the plan and voiced appreciation for the call back.

## 2021-04-26 DIAGNOSIS — I129 Hypertensive chronic kidney disease with stage 1 through stage 4 chronic kidney disease, or unspecified chronic kidney disease: Secondary | ICD-10-CM | POA: Diagnosis not present

## 2021-04-26 DIAGNOSIS — I6522 Occlusion and stenosis of left carotid artery: Secondary | ICD-10-CM | POA: Diagnosis not present

## 2021-04-26 DIAGNOSIS — E1122 Type 2 diabetes mellitus with diabetic chronic kidney disease: Secondary | ICD-10-CM | POA: Diagnosis not present

## 2021-04-26 DIAGNOSIS — I2510 Atherosclerotic heart disease of native coronary artery without angina pectoris: Secondary | ICD-10-CM | POA: Diagnosis not present

## 2021-04-26 DIAGNOSIS — N390 Urinary tract infection, site not specified: Secondary | ICD-10-CM | POA: Diagnosis not present

## 2021-04-26 DIAGNOSIS — N182 Chronic kidney disease, stage 2 (mild): Secondary | ICD-10-CM | POA: Diagnosis not present

## 2021-04-26 DIAGNOSIS — J432 Centrilobular emphysema: Secondary | ICD-10-CM | POA: Diagnosis not present

## 2021-04-27 DIAGNOSIS — S62115D Nondisplaced fracture of triquetrum [cuneiform] bone, left wrist, subsequent encounter for fracture with routine healing: Secondary | ICD-10-CM | POA: Diagnosis not present

## 2021-04-27 DIAGNOSIS — S52502A Unspecified fracture of the lower end of left radius, initial encounter for closed fracture: Secondary | ICD-10-CM | POA: Diagnosis not present

## 2021-04-27 DIAGNOSIS — Z7982 Long term (current) use of aspirin: Secondary | ICD-10-CM | POA: Diagnosis not present

## 2021-04-27 DIAGNOSIS — S52502D Unspecified fracture of the lower end of left radius, subsequent encounter for closed fracture with routine healing: Secondary | ICD-10-CM | POA: Diagnosis not present

## 2021-04-27 DIAGNOSIS — S52592A Other fractures of lower end of left radius, initial encounter for closed fracture: Secondary | ICD-10-CM | POA: Diagnosis not present

## 2021-04-28 ENCOUNTER — Ambulatory Visit (INDEPENDENT_AMBULATORY_CARE_PROVIDER_SITE_OTHER): Payer: Medicare HMO | Admitting: Vascular Surgery

## 2021-04-28 ENCOUNTER — Other Ambulatory Visit: Payer: Self-pay

## 2021-04-28 ENCOUNTER — Ambulatory Visit (INDEPENDENT_AMBULATORY_CARE_PROVIDER_SITE_OTHER): Payer: Medicare HMO

## 2021-04-28 VITALS — BP 138/77 | HR 75 | Ht 62.0 in | Wt 116.0 lb

## 2021-04-28 DIAGNOSIS — E782 Mixed hyperlipidemia: Secondary | ICD-10-CM

## 2021-04-28 DIAGNOSIS — M7989 Other specified soft tissue disorders: Secondary | ICD-10-CM

## 2021-04-28 DIAGNOSIS — N182 Chronic kidney disease, stage 2 (mild): Secondary | ICD-10-CM

## 2021-04-28 DIAGNOSIS — I10 Essential (primary) hypertension: Secondary | ICD-10-CM | POA: Diagnosis not present

## 2021-04-28 DIAGNOSIS — I771 Stricture of artery: Secondary | ICD-10-CM | POA: Diagnosis not present

## 2021-04-28 NOTE — Assessment & Plan Note (Signed)
The patient has noticeable swelling in the lower extremities and was in a hospital bed for several weeks.  Given this finding, I have some concern about a DVT and we will get a DVT study in the near future.  It has gotten better and she has been using compression stocking so DVT is not the most likely cause, but it is something that needs to be ruled out.

## 2021-04-28 NOTE — Assessment & Plan Note (Signed)
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  Her symptoms are better.  At this point, we will see her back in 6 months.
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
## 2021-05-04 ENCOUNTER — Ambulatory Visit (INDEPENDENT_AMBULATORY_CARE_PROVIDER_SITE_OTHER): Payer: Medicare HMO

## 2021-05-04 ENCOUNTER — Other Ambulatory Visit: Payer: Self-pay

## 2021-05-04 ENCOUNTER — Ambulatory Visit (INDEPENDENT_AMBULATORY_CARE_PROVIDER_SITE_OTHER): Payer: Medicare HMO | Admitting: Nurse Practitioner

## 2021-05-04 VITALS — BP 169/78 | HR 82 | Ht 62.0 in | Wt 119.0 lb

## 2021-05-04 DIAGNOSIS — E782 Mixed hyperlipidemia: Secondary | ICD-10-CM | POA: Diagnosis not present

## 2021-05-04 DIAGNOSIS — M7989 Other specified soft tissue disorders: Secondary | ICD-10-CM

## 2021-05-04 DIAGNOSIS — I10 Essential (primary) hypertension: Secondary | ICD-10-CM

## 2021-05-04 DIAGNOSIS — I771 Stricture of artery: Secondary | ICD-10-CM | POA: Diagnosis not present

## 2021-05-05 DIAGNOSIS — K219 Gastro-esophageal reflux disease without esophagitis: Secondary | ICD-10-CM | POA: Diagnosis not present

## 2021-05-05 DIAGNOSIS — M47812 Spondylosis without myelopathy or radiculopathy, cervical region: Secondary | ICD-10-CM | POA: Diagnosis not present

## 2021-05-05 DIAGNOSIS — Z79899 Other long term (current) drug therapy: Secondary | ICD-10-CM | POA: Diagnosis not present

## 2021-05-05 DIAGNOSIS — Z7982 Long term (current) use of aspirin: Secondary | ICD-10-CM | POA: Diagnosis not present

## 2021-05-05 DIAGNOSIS — I129 Hypertensive chronic kidney disease with stage 1 through stage 4 chronic kidney disease, or unspecified chronic kidney disease: Secondary | ICD-10-CM | POA: Diagnosis not present

## 2021-05-05 DIAGNOSIS — E1122 Type 2 diabetes mellitus with diabetic chronic kidney disease: Secondary | ICD-10-CM | POA: Diagnosis not present

## 2021-05-05 DIAGNOSIS — M5030 Other cervical disc degeneration, unspecified cervical region: Secondary | ICD-10-CM | POA: Diagnosis not present

## 2021-05-05 DIAGNOSIS — S12101D Unspecified nondisplaced fracture of second cervical vertebra, subsequent encounter for fracture with routine healing: Secondary | ICD-10-CM | POA: Diagnosis not present

## 2021-05-05 DIAGNOSIS — E785 Hyperlipidemia, unspecified: Secondary | ICD-10-CM | POA: Diagnosis not present

## 2021-05-05 DIAGNOSIS — S12101A Unspecified nondisplaced fracture of second cervical vertebra, initial encounter for closed fracture: Secondary | ICD-10-CM | POA: Diagnosis not present

## 2021-05-05 DIAGNOSIS — N182 Chronic kidney disease, stage 2 (mild): Secondary | ICD-10-CM | POA: Diagnosis not present
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
## 2021-05-08 ENCOUNTER — Other Ambulatory Visit: Payer: Self-pay

## 2021-05-08 ENCOUNTER — Ambulatory Visit: Payer: Medicare HMO | Admitting: Physician Assistant

## 2021-05-08 ENCOUNTER — Encounter: Payer: Self-pay | Admitting: Physician Assistant

## 2021-05-08 VITALS — BP 180/90 | HR 68 | Ht 62.0 in | Wt 118.5 lb

## 2021-05-08 DIAGNOSIS — I2510 Atherosclerotic heart disease of native coronary artery without angina pectoris: Secondary | ICD-10-CM

## 2021-05-08 DIAGNOSIS — Z87891 Personal history of nicotine dependence: Secondary | ICD-10-CM

## 2021-05-08 DIAGNOSIS — T07XXXA Unspecified multiple injuries, initial encounter: Secondary | ICD-10-CM

## 2021-05-08 DIAGNOSIS — I10 Essential (primary) hypertension: Secondary | ICD-10-CM | POA: Diagnosis not present

## 2021-05-08 DIAGNOSIS — E785 Hyperlipidemia, unspecified: Secondary | ICD-10-CM | POA: Diagnosis not present

## 2021-05-08 MED ORDER — LIDOCAINE 5 % EX PTCH: 1.0000 | MEDICATED_PATCH | Freq: Two times a day (BID) | CUTANEOUS | 3 refills | Status: DC | PRN

## 2021-05-08 MED ORDER — ISOSORBIDE MONONITRATE ER 30 MG PO TB24: 15.0000 mg | ORAL_TABLET | Freq: Every day | ORAL | 3 refills | Status: DC

## 2021-05-08 MED ORDER — CARVEDILOL 6.25 MG PO TABS: 6.2500 mg | ORAL_TABLET | Freq: Two times a day (BID) | ORAL | 3 refills | Status: DC

## 2021-05-08 NOTE — Patient Instructions (Addendum)
Medication Instructions:  Your physician has recommended you make the following change in your medication:   INCREASE Carvedilol 6.25 mg twice a day START Isosorbide mononitrate 30 mg and take one half tablet (15 mg) once a day START Lidoderm 5% patch every 12 hours as needed for pain.   *If you need a refill on your cardiac medications before your next appointment, please call your pharmacy*   Lab Work: None  If you have labs (blood work) drawn today and your tests are completely normal, you will receive your results only by: [place] (if you have MyChart) OR A paper copy in the mail If you have any lab test that is abnormal or we need to change your treatment, we will call you to review the results.   Testing/Procedures: None   Follow-Up: At [facility], you and your health needs are our priority.  As part of our continuing mission to provide you with exceptional heart care, we have created designated Provider Care Teams.  These Care Teams include your primary Cardiologist (physician) and Advanced Practice Providers (APPs -  Physician Assistants and Nurse Practitioners) who all work together to provide you with the care you need, when you need it.   Your next appointment:   2 month(s)  The format for your next appointment:   In Person  Provider:   [name], MD or Christell Faith, PA-C

## 2021-05-09 ENCOUNTER — Telehealth: Payer: Self-pay

## 2021-05-09 NOTE — Telephone Encounter (Signed)
PA initiated by [email or web address] for Lidocaine 5% patches. KEY: [id number]   RESPONSE: Your information has been submitted to [facility]. [employer] will review the request and will issue a decision, typically within 3-7 days from your submission. You can check the updated outcome later by reopening this request.  If [employer] has not responded in 3-7 days or if you have any questions about your ePA request, please contact [employer] at [phone].

## 2021-05-10 DIAGNOSIS — Z86010 Personal history of colonic polyps: Secondary | ICD-10-CM | POA: Diagnosis not present

## 2021-05-11 NOTE — Telephone Encounter (Signed)
Letter received from [facility] stating that the lidocaine patches were not approved by her insurance. Will reach out to patient to review other options which are to reach out to her primary care provider and/or use the lidocaine patches over the counter which are 4%.

## 2021-05-11 NOTE — Telephone Encounter (Signed)
Spoke with Cheryl Hall patients daughter in law and reviewed insurance did not approve the lidocaine patches. Reviewed options as she truly needs something for her pain. Advised the 4% patches are available over the counter and she could also speak with her primary care provider about them and/or something a little stronger to help with her recovery. She verbalized understanding of our conversation, agreement with plan, and had no further questions at this time.

## 2021-05-11 NOTE — Telephone Encounter (Signed)
No answer/Voicemail box is full.  

## 2021-05-14 ENCOUNTER — Encounter (INDEPENDENT_AMBULATORY_CARE_PROVIDER_SITE_OTHER): Payer: Self-pay | Admitting: Nurse Practitioner
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
## 2021-05-15 DIAGNOSIS — I10 Essential (primary) hypertension: Secondary | ICD-10-CM | POA: Diagnosis not present

## 2021-05-15 DIAGNOSIS — S12101D Unspecified nondisplaced fracture of second cervical vertebra, subsequent encounter for fracture with routine healing: Secondary | ICD-10-CM | POA: Diagnosis not present

## 2021-05-15 DIAGNOSIS — R110 Nausea: Secondary | ICD-10-CM | POA: Diagnosis not present

## 2021-05-15 DIAGNOSIS — R531 Weakness: Secondary | ICD-10-CM | POA: Diagnosis not present

## 2021-05-15 DIAGNOSIS — Z87828 Personal history of other (healed) physical injury and trauma: Secondary | ICD-10-CM | POA: Diagnosis not present

## 2021-05-15 DIAGNOSIS — R0789 Other chest pain: Secondary | ICD-10-CM | POA: Diagnosis not present

## 2021-05-15 DIAGNOSIS — F419 Anxiety disorder, unspecified: Secondary | ICD-10-CM | POA: Diagnosis not present

## 2021-05-15 DIAGNOSIS — R0602 Shortness of breath: Secondary | ICD-10-CM | POA: Diagnosis not present

## 2021-05-23 DIAGNOSIS — R0902 Hypoxemia: Secondary | ICD-10-CM | POA: Diagnosis not present

## 2021-05-23 DIAGNOSIS — S12101D Unspecified nondisplaced fracture of second cervical vertebra, subsequent encounter for fracture with routine healing: Secondary | ICD-10-CM | POA: Diagnosis not present

## 2021-05-23 DIAGNOSIS — E782 Mixed hyperlipidemia: Secondary | ICD-10-CM | POA: Diagnosis not present

## 2021-05-23 DIAGNOSIS — I10 Essential (primary) hypertension: Secondary | ICD-10-CM | POA: Diagnosis not present

## 2021-05-23 DIAGNOSIS — E119 Type 2 diabetes mellitus without complications: Secondary | ICD-10-CM | POA: Diagnosis not present

## 2021-05-23 DIAGNOSIS — S22000D Wedge compression fracture of unspecified thoracic vertebra, subsequent encounter for fracture with routine healing: Secondary | ICD-10-CM | POA: Diagnosis not present

## 2021-06-16 DIAGNOSIS — M47812 Spondylosis without myelopathy or radiculopathy, cervical region: Secondary | ICD-10-CM | POA: Diagnosis not present

## 2021-06-16 DIAGNOSIS — N182 Chronic kidney disease, stage 2 (mild): Secondary | ICD-10-CM | POA: Diagnosis not present

## 2021-06-16 DIAGNOSIS — E785 Hyperlipidemia, unspecified: Secondary | ICD-10-CM | POA: Diagnosis not present

## 2021-06-16 DIAGNOSIS — S12101D Unspecified nondisplaced fracture of second cervical vertebra, subsequent encounter for fracture with routine healing: Secondary | ICD-10-CM | POA: Diagnosis not present

## 2021-06-16 DIAGNOSIS — E1122 Type 2 diabetes mellitus with diabetic chronic kidney disease: Secondary | ICD-10-CM | POA: Diagnosis not present

## 2021-06-16 DIAGNOSIS — Z7982 Long term (current) use of aspirin: Secondary | ICD-10-CM | POA: Diagnosis not present

## 2021-06-16 DIAGNOSIS — K219 Gastro-esophageal reflux disease without esophagitis: Secondary | ICD-10-CM | POA: Diagnosis not present

## 2021-06-16 DIAGNOSIS — S12101A Unspecified nondisplaced fracture of second cervical vertebra, initial encounter for closed fracture: Secondary | ICD-10-CM | POA: Diagnosis not present

## 2021-06-16 DIAGNOSIS — I129 Hypertensive chronic kidney disease with stage 1 through stage 4 chronic kidney disease, or unspecified chronic kidney disease: Secondary | ICD-10-CM | POA: Diagnosis not present

## 2021-06-21 ENCOUNTER — Encounter: Payer: Self-pay | Admitting: Emergency Medicine

## 2021-06-21 ENCOUNTER — Emergency Department: Payer: Medicare HMO

## 2021-06-21 ENCOUNTER — Emergency Department
Admission: EM | Admit: 2021-06-21 | Discharge: 2021-06-21 | Disposition: A | Discharge status: Home / Self Care | Payer: Medicare HMO | Attending: Emergency Medicine | Admitting: Emergency Medicine

## 2021-06-21 ENCOUNTER — Other Ambulatory Visit: Payer: Self-pay

## 2021-06-21 DIAGNOSIS — N189 Chronic kidney disease, unspecified: Secondary | ICD-10-CM | POA: Insufficient documentation

## 2021-06-21 DIAGNOSIS — E860 Dehydration: Secondary | ICD-10-CM | POA: Insufficient documentation

## 2021-06-21 DIAGNOSIS — K5730 Diverticulosis of large intestine without perforation or abscess without bleeding: Secondary | ICD-10-CM | POA: Diagnosis not present

## 2021-06-21 DIAGNOSIS — K429 Umbilical hernia without obstruction or gangrene: Secondary | ICD-10-CM | POA: Diagnosis not present

## 2021-06-21 DIAGNOSIS — N83202 Unspecified ovarian cyst, left side: Secondary | ICD-10-CM | POA: Diagnosis not present

## 2021-06-21 DIAGNOSIS — R34 Anuria and oliguria: Secondary | ICD-10-CM | POA: Insufficient documentation

## 2021-06-21 DIAGNOSIS — I2510 Atherosclerotic heart disease of native coronary artery without angina pectoris: Secondary | ICD-10-CM | POA: Diagnosis not present

## 2021-06-21 DIAGNOSIS — N83201 Unspecified ovarian cyst, right side: Secondary | ICD-10-CM | POA: Diagnosis not present

## 2021-06-21 DIAGNOSIS — N281 Cyst of kidney, acquired: Secondary | ICD-10-CM | POA: Diagnosis not present

## 2021-06-21 DIAGNOSIS — I10 Essential (primary) hypertension: Secondary | ICD-10-CM | POA: Diagnosis not present

## 2021-06-21 LAB — CBC
HCT: 39.1 % (ref 36.0–46.0)
Hemoglobin: 12.2 g/dL (ref 12.0–15.0)
MCH: 29.7 pg (ref 26.0–34.0)
MCHC: 31.2 g/dL (ref 30.0–36.0)
MCV: 95.1 fL (ref 80.0–100.0)
Platelets: 156 10*3/uL (ref 150–400)
RBC: 4.11 MIL/uL (ref 3.87–5.11)
RDW: 14 % (ref 11.5–15.5)
WBC: 5.7 10*3/uL (ref 4.0–10.5)
nRBC: 0 % (ref 0.0–0.2)

## 2021-06-21 LAB — URINALYSIS, MICROSCOPIC (REFLEX)

## 2021-06-21 LAB — BASIC METABOLIC PANEL
Anion gap: 10 (ref 5–15)
BUN: 22 mg/dL (ref 8–23)
CO2: 32 mmol/L (ref 22–32)
Calcium: 8.9 mg/dL (ref 8.9–10.3)
Chloride: 100 mmol/L (ref 98–111)
Creatinine, Ser: 1.62 mg/dL — ABNORMAL HIGH (ref 0.44–1.00)
GFR, Estimated: 32 mL/min — ABNORMAL LOW (ref >60)
Glucose, Bld: 142 mg/dL — ABNORMAL HIGH (ref 70–99)
Potassium: 3.2 mmol/L — ABNORMAL LOW (ref 3.5–5.1)
Sodium: 142 mmol/L (ref 135–145)

## 2021-06-21 LAB — URINALYSIS, ROUTINE W REFLEX MICROSCOPIC
Glucose, UA: NEGATIVE mg/dL
Hgb urine dipstick: NEGATIVE
Ketones, ur: NEGATIVE mg/dL
Leukocytes,Ua: NEGATIVE
Nitrite: NEGATIVE
Protein, ur: 100 mg/dL — AB
Specific Gravity, Urine: >1.03 — ABNORMAL HIGH (ref 1.005–1.030)
pH: 5 (ref 5.0–8.0)

## 2021-06-21 MED ORDER — IOHEXOL 300 MG/ML  SOLN
100.0000 mL | Freq: Once | INTRAMUSCULAR | Status: AC | PRN
  Administered 2021-06-21: 21:00:00 60 mL via INTRAVENOUS

## 2021-06-21 MED ORDER — SODIUM CHLORIDE 0.9 % IV BOLUS
1000.0000 mL | Freq: Once | INTRAVENOUS | Status: AC
Start: 2021-06-21 — End: 2021-06-21
  Administered 2021-06-21: 19:00:00 1000 mL via INTRAVENOUS
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
## 2021-06-21 NOTE — ED Notes (Signed)
CT notified that pt has finished oral contrast

## 2021-06-21 NOTE — ED Notes (Signed)
Pt discharge information reviewed. Pt understands need for follow up care and when to return if symptoms worsen. All questions answered. Pt is alert and oriented with even and regular respirations. Pt is seen ambulating out of department with string steady gait with family.  °
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
## 2021-07-12 ENCOUNTER — Ambulatory Visit: Payer: Medicare HMO | Admitting: Physician Assistant

## 2021-07-12 ENCOUNTER — Other Ambulatory Visit: Payer: Self-pay

## 2021-07-12 ENCOUNTER — Encounter: Payer: Self-pay | Admitting: Physician Assistant

## 2021-07-12 VITALS — BP 130/80 | HR 68 | Ht 62.0 in | Wt 104.2 lb

## 2021-07-12 DIAGNOSIS — I2510 Atherosclerotic heart disease of native coronary artery without angina pectoris: Secondary | ICD-10-CM | POA: Diagnosis not present

## 2021-07-12 DIAGNOSIS — E785 Hyperlipidemia, unspecified: Secondary | ICD-10-CM

## 2021-07-12 DIAGNOSIS — N179 Acute kidney failure, unspecified: Secondary | ICD-10-CM

## 2021-07-12 DIAGNOSIS — R634 Abnormal weight loss: Secondary | ICD-10-CM

## 2021-07-12 DIAGNOSIS — I10 Essential (primary) hypertension: Secondary | ICD-10-CM

## 2021-07-12 NOTE — Patient Instructions (Signed)
Medication Instructions:  Your physician recommends that you continue on your current medications as directed. Please refer to the Current Medication list given to you today.  *If you need a refill on your cardiac medications before your next appointment, please call your pharmacy*   Lab Work: Bmp today If you have labs (blood work) drawn today and your tests are completely normal, you will receive your results only by: [place] (if you have MyChart) OR A paper copy in the mail If you have any lab test that is abnormal or we need to change your treatment, we will call you to review the results.   Testing/Procedures: None ordered   Follow-Up: At [facility], you and your health needs are our priority.  As part of our continuing mission to provide you with exceptional heart care, we have created designated Provider Care Teams.  These Care Teams include your primary Cardiologist (physician) and Advanced Practice Providers (APPs -  Physician Assistants and Nurse Practitioners) who all work together to provide you with the care you need, when you need it.  We recommend signing up for the patient portal called "MyChart".  Sign up information is provided on this After Visit Summary.  MyChart is used to connect with patients for Virtual Visits (Telemedicine).  Patients are able to view lab/test results, encounter notes, upcoming appointments, etc.  Non-urgent messages can be sent to your provider as well.   To learn more about what you can do with MyChart, go to [email or web address].    Your next appointment:   6 month(s)  The format for your next appointment:   In Person  Provider:   You may see [name], MD or one of the following Advanced Practice Providers on your designated Care Team:   Murray Hodgkins, NP Christell Faith, PA-C Cadence Kathlen Mody, [place]   Other Instructions N/A

## 2021-07-13 LAB — BASIC METABOLIC PANEL
BUN/Creatinine Ratio: 14 (ref 12–28)
BUN: 18 mg/dL (ref 8–27)
CO2: 25 mmol/L (ref 20–29)
Calcium: 9.3 mg/dL (ref 8.7–10.3)
Chloride: 110 mmol/L — ABNORMAL HIGH (ref 96–106)
Creatinine, Ser: 1.28 mg/dL — ABNORMAL HIGH (ref 0.57–1.00)
Glucose: 72 mg/dL (ref 70–99)
Potassium: 3.9 mmol/L (ref 3.5–5.2)
Sodium: 148 mmol/L — ABNORMAL HIGH (ref 134–144)
eGFR: 43 mL/min/{1.73_m2} — ABNORMAL LOW (ref >59)

## 2021-07-19 DIAGNOSIS — S22000D Wedge compression fracture of unspecified thoracic vertebra, subsequent encounter for fracture with routine healing: Secondary | ICD-10-CM | POA: Diagnosis not present

## 2021-07-19 DIAGNOSIS — R531 Weakness: Secondary | ICD-10-CM | POA: Diagnosis not present

## 2021-07-19 DIAGNOSIS — I10 Essential (primary) hypertension: Secondary | ICD-10-CM | POA: Diagnosis not present

## 2021-07-19 DIAGNOSIS — L820 Inflamed seborrheic keratosis: Secondary | ICD-10-CM | POA: Diagnosis not present

## 2021-07-19 DIAGNOSIS — R0902 Hypoxemia: Secondary | ICD-10-CM | POA: Diagnosis not present

## 2021-07-19 DIAGNOSIS — R634 Abnormal weight loss: Secondary | ICD-10-CM | POA: Diagnosis not present

## 2021-07-20 ENCOUNTER — Encounter (HOSPITAL_COMMUNITY): Payer: Self-pay | Admitting: Radiology

## 2021-08-11 DIAGNOSIS — S129XXA Fracture of neck, unspecified, initial encounter: Secondary | ICD-10-CM | POA: Diagnosis not present

## 2021-08-11 DIAGNOSIS — R0602 Shortness of breath: Secondary | ICD-10-CM | POA: Diagnosis not present

## 2021-08-11 DIAGNOSIS — J9621 Acute and chronic respiratory failure with hypoxia: Secondary | ICD-10-CM | POA: Diagnosis not present

## 2021-08-21 DIAGNOSIS — N949 Unspecified condition associated with female genital organs and menstrual cycle: Secondary | ICD-10-CM | POA: Diagnosis not present

## 2021-08-21 DIAGNOSIS — E782 Mixed hyperlipidemia: Secondary | ICD-10-CM | POA: Diagnosis not present

## 2021-08-21 DIAGNOSIS — J432 Centrilobular emphysema: Secondary | ICD-10-CM | POA: Diagnosis not present

## 2021-08-21 DIAGNOSIS — R634 Abnormal weight loss: Secondary | ICD-10-CM | POA: Diagnosis not present

## 2021-08-21 DIAGNOSIS — I10 Essential (primary) hypertension: Secondary | ICD-10-CM | POA: Diagnosis not present

## 2021-08-21 DIAGNOSIS — R0902 Hypoxemia: Secondary | ICD-10-CM | POA: Diagnosis not present

## 2021-08-21 DIAGNOSIS — S22000S Wedge compression fracture of unspecified thoracic vertebra, sequela: Secondary | ICD-10-CM | POA: Diagnosis not present

## 2021-08-21 DIAGNOSIS — E119 Type 2 diabetes mellitus without complications: Secondary | ICD-10-CM | POA: Diagnosis not present

## 2021-08-26 ENCOUNTER — Encounter: Payer: Self-pay | Admitting: Emergency Medicine

## 2021-08-26 ENCOUNTER — Emergency Department: Payer: Medicare HMO

## 2021-08-26 ENCOUNTER — Other Ambulatory Visit: Payer: Self-pay

## 2021-08-26 ENCOUNTER — Emergency Department
Admission: EM | Admit: 2021-08-26 | Discharge: 2021-08-26 | Disposition: A | Discharge status: Home / Self Care | Payer: Medicare HMO | Attending: Emergency Medicine | Admitting: Emergency Medicine

## 2021-08-26 DIAGNOSIS — N281 Cyst of kidney, acquired: Secondary | ICD-10-CM | POA: Diagnosis not present

## 2021-08-26 DIAGNOSIS — R519 Headache, unspecified: Secondary | ICD-10-CM | POA: Insufficient documentation

## 2021-08-26 DIAGNOSIS — Z20822 Contact with and (suspected) exposure to covid-19: Secondary | ICD-10-CM | POA: Insufficient documentation

## 2021-08-26 DIAGNOSIS — D649 Anemia, unspecified: Secondary | ICD-10-CM | POA: Diagnosis not present

## 2021-08-26 DIAGNOSIS — K5900 Constipation, unspecified: Secondary | ICD-10-CM | POA: Diagnosis not present

## 2021-08-26 DIAGNOSIS — R110 Nausea: Secondary | ICD-10-CM | POA: Insufficient documentation

## 2021-08-26 DIAGNOSIS — R102 Pelvic and perineal pain: Secondary | ICD-10-CM | POA: Diagnosis not present

## 2021-08-26 DIAGNOSIS — I2510 Atherosclerotic heart disease of native coronary artery without angina pectoris: Secondary | ICD-10-CM | POA: Insufficient documentation

## 2021-08-26 DIAGNOSIS — J449 Chronic obstructive pulmonary disease, unspecified: Secondary | ICD-10-CM | POA: Diagnosis not present

## 2021-08-26 DIAGNOSIS — R109 Unspecified abdominal pain: Secondary | ICD-10-CM

## 2021-08-26 DIAGNOSIS — R1030 Lower abdominal pain, unspecified: Secondary | ICD-10-CM | POA: Diagnosis not present

## 2021-08-26 DIAGNOSIS — N189 Chronic kidney disease, unspecified: Secondary | ICD-10-CM | POA: Insufficient documentation

## 2021-08-26 DIAGNOSIS — K5730 Diverticulosis of large intestine without perforation or abscess without bleeding: Secondary | ICD-10-CM | POA: Diagnosis not present

## 2021-08-26 DIAGNOSIS — N261 Atrophy of kidney (terminal): Secondary | ICD-10-CM | POA: Diagnosis not present

## 2021-08-26 DIAGNOSIS — R1084 Generalized abdominal pain: Secondary | ICD-10-CM | POA: Diagnosis present

## 2021-08-26 LAB — CBC WITH DIFFERENTIAL/PLATELET
Abs Immature Granulocytes: 0.01 10*3/uL (ref 0.00–0.07)
Basophils Absolute: 0 10*3/uL (ref 0.0–0.1)
Basophils Relative: 1 %
Eosinophils Absolute: 0.1 10*3/uL (ref 0.0–0.5)
Eosinophils Relative: 2 %
HCT: 33.5 % — ABNORMAL LOW (ref 36.0–46.0)
Hemoglobin: 10.3 g/dL — ABNORMAL LOW (ref 12.0–15.0)
Immature Granulocytes: 0 %
Lymphocytes Relative: 22 %
Lymphs Abs: 1.2 10*3/uL (ref 0.7–4.0)
MCH: 28.7 pg (ref 26.0–34.0)
MCHC: 30.7 g/dL (ref 30.0–36.0)
MCV: 93.3 fL (ref 80.0–100.0)
Monocytes Absolute: 0.6 10*3/uL (ref 0.1–1.0)
Monocytes Relative: 11 %
Neutro Abs: 3.6 10*3/uL (ref 1.7–7.7)
Neutrophils Relative %: 64 %
Platelets: 125 10*3/uL — ABNORMAL LOW (ref 150–400)
RBC: 3.59 MIL/uL — ABNORMAL LOW (ref 3.87–5.11)
RDW: 16.5 % — ABNORMAL HIGH (ref 11.5–15.5)
WBC: 5.5 10*3/uL (ref 4.0–10.5)
nRBC: 0 % (ref 0.0–0.2)

## 2021-08-26 LAB — COMPREHENSIVE METABOLIC PANEL
ALT: 8 U/L (ref 0–44)
AST: 21 U/L (ref 15–41)
Albumin: 3.6 g/dL (ref 3.5–5.0)
Alkaline Phosphatase: 76 U/L (ref 38–126)
Anion gap: 6 (ref 5–15)
BUN: 19 mg/dL (ref 8–23)
CO2: 35 mmol/L — ABNORMAL HIGH (ref 22–32)
Calcium: 9 mg/dL (ref 8.9–10.3)
Chloride: 97 mmol/L — ABNORMAL LOW (ref 98–111)
Creatinine, Ser: 1.29 mg/dL — ABNORMAL HIGH (ref 0.44–1.00)
GFR, Estimated: 42 mL/min — ABNORMAL LOW (ref >60)
Glucose, Bld: 100 mg/dL — ABNORMAL HIGH (ref 70–99)
Potassium: 4.1 mmol/L (ref 3.5–5.1)
Sodium: 138 mmol/L (ref 135–145)
Total Bilirubin: 0.8 mg/dL (ref 0.3–1.2)
Total Protein: 6.1 g/dL — ABNORMAL LOW (ref 6.5–8.1)

## 2021-08-26 LAB — URINALYSIS, ROUTINE W REFLEX MICROSCOPIC
Bilirubin Urine: NEGATIVE
Glucose, UA: NEGATIVE mg/dL
Hgb urine dipstick: NEGATIVE
Ketones, ur: NEGATIVE mg/dL
Leukocytes,Ua: NEGATIVE
Nitrite: NEGATIVE
Protein, ur: NEGATIVE mg/dL
Specific Gravity, Urine: 1.018 (ref 1.005–1.030)
pH: 9 — ABNORMAL HIGH (ref 5.0–8.0)

## 2021-08-26 LAB — TROPONIN I (HIGH SENSITIVITY)
Troponin I (High Sensitivity): 11 ng/L (ref <18)
Troponin I (High Sensitivity): 11 ng/L (ref <18)

## 2021-08-26 LAB — LIPASE, BLOOD: Lipase: 30 U/L (ref 11–51)

## 2021-08-26 LAB — RESP PANEL BY RT-PCR (FLU A&B, COVID) ARPGX2
Influenza A by PCR: NEGATIVE
Influenza B by PCR: NEGATIVE
SARS Coronavirus 2 by RT PCR: NEGATIVE

## 2021-08-26 MED ORDER — ONDANSETRON HCL 4 MG/2ML IJ SOLN
4.0000 mg | Freq: Once | INTRAMUSCULAR | Status: AC
  Administered 2021-08-26: 4 mg via INTRAVENOUS
  Filled 2021-08-26: qty 2

## 2021-08-26 MED ORDER — SODIUM CHLORIDE 0.9 % IV BOLUS
500.0000 mL | Freq: Once | INTRAVENOUS | Status: AC
Start: 2021-08-26 — End: 2021-08-26
  Administered 2021-08-26: 500 mL via INTRAVENOUS

## 2021-08-26 MED ORDER — IOHEXOL 300 MG/ML  SOLN
100.0000 mL | Freq: Once | INTRAMUSCULAR | Status: AC | PRN
  Administered 2021-08-26: 100 mL via INTRAVENOUS

## 2021-08-26 NOTE — ED Notes (Addendum)
This RN made aware by NT that pt SpO2 in 60s. Pt was on RA when arrived from [place]. Improvement to 70s on 4L [place]. Placed on NRB for less than 5 minutes, improved to 100%. Titrated back to 4L . Dr Jari Pigg and Primary RN Crystal made aware of O2 requirement.  ?

## 2021-08-26 NOTE — ED Triage Notes (Signed)
Pt via POV from home. Pt c/o lower abd pain since Thursday, pt has been has been having constipation for the past couple of days. Pt is on [place] at baseline 2L. Pt is A&OX4 and NAD.  ?

## 2021-08-26 NOTE — ED Notes (Signed)
Dc instructions reviewed with pt and family member no questions or concerns at this time. Will follow up with pcp this week to have blood redrawn.  ?

## 2021-08-26 NOTE — ED Notes (Signed)
RN to bedside to introduce self to pt and initiate pt care. MD at bedside. Rectal exam done by MD.  ?

## 2021-08-26 NOTE — Discharge Instructions (Addendum)
Your work-up was reassuring.  Your hemoglobin was slightly lower and she should follow-up for repeat in 1 week and return to the ER if you develop any black tarry stool. ?She should remain on her oxygen at all times. ?Return to the ER for any other concerns. ?
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
## 2021-08-28 ENCOUNTER — Other Ambulatory Visit: Payer: Self-pay | Admitting: Physician Assistant

## 2021-08-28 ENCOUNTER — Other Ambulatory Visit (HOSPITAL_COMMUNITY): Payer: Self-pay | Admitting: Physician Assistant

## 2021-08-28 DIAGNOSIS — R531 Weakness: Secondary | ICD-10-CM

## 2021-08-28 DIAGNOSIS — R0902 Hypoxemia: Secondary | ICD-10-CM

## 2021-08-28 DIAGNOSIS — J432 Centrilobular emphysema: Secondary | ICD-10-CM

## 2021-08-28 DIAGNOSIS — R634 Abnormal weight loss: Secondary | ICD-10-CM

## 2021-09-04 DIAGNOSIS — R399 Unspecified symptoms and signs involving the genitourinary system: Secondary | ICD-10-CM | POA: Diagnosis not present

## 2021-09-05 DIAGNOSIS — E861 Hypovolemia: Secondary | ICD-10-CM | POA: Diagnosis not present

## 2021-09-05 DIAGNOSIS — R42 Dizziness and giddiness: Secondary | ICD-10-CM | POA: Diagnosis not present

## 2021-09-05 DIAGNOSIS — I9589 Other hypotension: Secondary | ICD-10-CM | POA: Diagnosis not present

## 2021-09-07 ENCOUNTER — Ambulatory Visit
Admission: RE | Admit: 2021-09-07 | Discharge: 2021-09-07 | Disposition: A | Discharge status: Home / Self Care | Payer: Medicare HMO | Source: Ambulatory Visit | Attending: Physician Assistant | Admitting: Physician Assistant

## 2021-09-07 DIAGNOSIS — J432 Centrilobular emphysema: Secondary | ICD-10-CM | POA: Diagnosis not present

## 2021-09-07 DIAGNOSIS — R531 Weakness: Secondary | ICD-10-CM | POA: Diagnosis not present

## 2021-09-07 DIAGNOSIS — R634 Abnormal weight loss: Secondary | ICD-10-CM

## 2021-09-07 DIAGNOSIS — R0902 Hypoxemia: Secondary | ICD-10-CM | POA: Diagnosis not present

## 2021-09-07 DIAGNOSIS — J9809 Other diseases of bronchus, not elsewhere classified: Secondary | ICD-10-CM | POA: Diagnosis not present

## 2021-09-07 DIAGNOSIS — R918 Other nonspecific abnormal finding of lung field: Secondary | ICD-10-CM | POA: Diagnosis not present

## 2021-09-07 MED ORDER — IOHEXOL 300 MG/ML  SOLN
75.0000 mL | Freq: Once | INTRAMUSCULAR | Status: AC | PRN
  Administered 2021-09-07: 60 mL via INTRAVENOUS

## 2021-09-15 ENCOUNTER — Other Ambulatory Visit: Payer: Self-pay

## 2021-09-15 ENCOUNTER — Emergency Department
Admission: EM | Admit: 2021-09-15 | Discharge: 2021-09-15 | Disposition: A | Discharge status: Home / Self Care | Payer: Medicare HMO | Attending: Emergency Medicine | Admitting: Emergency Medicine

## 2021-09-15 ENCOUNTER — Emergency Department: Payer: Medicare HMO

## 2021-09-15 DIAGNOSIS — R630 Anorexia: Secondary | ICD-10-CM | POA: Insufficient documentation

## 2021-09-15 DIAGNOSIS — M439 Deforming dorsopathy, unspecified: Secondary | ICD-10-CM | POA: Diagnosis not present

## 2021-09-15 DIAGNOSIS — R109 Unspecified abdominal pain: Secondary | ICD-10-CM | POA: Insufficient documentation

## 2021-09-15 DIAGNOSIS — M25512 Pain in left shoulder: Secondary | ICD-10-CM | POA: Diagnosis not present

## 2021-09-15 DIAGNOSIS — M954 Acquired deformity of chest and rib: Secondary | ICD-10-CM | POA: Diagnosis not present

## 2021-09-15 DIAGNOSIS — X58XXXA Exposure to other specified factors, initial encounter: Secondary | ICD-10-CM | POA: Diagnosis not present

## 2021-09-15 DIAGNOSIS — R1012 Left upper quadrant pain: Secondary | ICD-10-CM | POA: Diagnosis not present

## 2021-09-15 DIAGNOSIS — R110 Nausea: Secondary | ICD-10-CM | POA: Diagnosis not present

## 2021-09-15 DIAGNOSIS — S2242XA Multiple fractures of ribs, left side, initial encounter for closed fracture: Secondary | ICD-10-CM | POA: Diagnosis not present

## 2021-09-15 DIAGNOSIS — S2232XA Fracture of one rib, left side, initial encounter for closed fracture: Secondary | ICD-10-CM | POA: Insufficient documentation

## 2021-09-15 DIAGNOSIS — R0781 Pleurodynia: Secondary | ICD-10-CM | POA: Diagnosis not present

## 2021-09-15 LAB — COMPREHENSIVE METABOLIC PANEL
ALT: 9 U/L (ref 0–44)
AST: 20 U/L (ref 15–41)
Albumin: 3.8 g/dL (ref 3.5–5.0)
Alkaline Phosphatase: 68 U/L (ref 38–126)
Anion gap: 8 (ref 5–15)
BUN: 14 mg/dL (ref 8–23)
CO2: 33 mmol/L — ABNORMAL HIGH (ref 22–32)
Calcium: 9.3 mg/dL (ref 8.9–10.3)
Chloride: 95 mmol/L — ABNORMAL LOW (ref 98–111)
Creatinine, Ser: 0.99 mg/dL (ref 0.44–1.00)
GFR, Estimated: 58 mL/min — ABNORMAL LOW (ref >60)
Glucose, Bld: 100 mg/dL — ABNORMAL HIGH (ref 70–99)
Potassium: 4.2 mmol/L (ref 3.5–5.1)
Sodium: 136 mmol/L (ref 135–145)
Total Bilirubin: 1.1 mg/dL (ref 0.3–1.2)
Total Protein: 6.3 g/dL — ABNORMAL LOW (ref 6.5–8.1)

## 2021-09-15 LAB — URINALYSIS, ROUTINE W REFLEX MICROSCOPIC
Bilirubin Urine: NEGATIVE
Glucose, UA: NEGATIVE mg/dL
Hgb urine dipstick: NEGATIVE
Ketones, ur: NEGATIVE mg/dL
Leukocytes,Ua: NEGATIVE
Nitrite: NEGATIVE
Protein, ur: NEGATIVE mg/dL
Specific Gravity, Urine: 1.004 — ABNORMAL LOW (ref 1.005–1.030)
pH: 8 (ref 5.0–8.0)

## 2021-09-15 LAB — CBC
HCT: 33.6 % — ABNORMAL LOW (ref 36.0–46.0)
Hemoglobin: 10.3 g/dL — ABNORMAL LOW (ref 12.0–15.0)
MCH: 28.8 pg (ref 26.0–34.0)
MCHC: 30.7 g/dL (ref 30.0–36.0)
MCV: 93.9 fL (ref 80.0–100.0)
Platelets: 129 10*3/uL — ABNORMAL LOW (ref 150–400)
RBC: 3.58 MIL/uL — ABNORMAL LOW (ref 3.87–5.11)
RDW: 16.3 % — ABNORMAL HIGH (ref 11.5–15.5)
WBC: 6.3 10*3/uL (ref 4.0–10.5)
nRBC: 0 % (ref 0.0–0.2)

## 2021-09-15 LAB — LIPASE, BLOOD: Lipase: 27 U/L (ref 11–51)

## 2021-09-15 MED ORDER — LIDOCAINE 5 % EX PTCH: 1.0000 | MEDICATED_PATCH | Freq: Two times a day (BID) | CUTANEOUS | 0 refills | Status: AC

## 2021-09-15 MED ORDER — SODIUM CHLORIDE 0.9 % IV BOLUS
1000.0000 mL | Freq: Once | INTRAVENOUS | Status: AC
  Administered 2021-09-15: 1000 mL via INTRAVENOUS
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
## 2021-09-15 NOTE — ED Notes (Signed)
Pt assisted back to stretcher ? ?

## 2021-09-15 NOTE — ED Notes (Signed)
Pt assisted to toilet min assist via wheelchair ? ?

## 2021-09-15 NOTE — ED Notes (Signed)
Dc ppw provided. Pt follow up information reviewed. Pt rx information given. Pt denies any questions at this time. Pt and daughter provide verbal consent for dc. Pt assisted to lobby via wheelchair. ?
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
## 2021-09-15 NOTE — ED Notes (Signed)
Pt requesting to go home. MD made aware

## 2021-09-15 NOTE — Discharge Instructions (Signed)
Please seek medical attention for any high fevers, chest pain, shortness of breath, change in behavior, persistent vomiting, bloody stool or any other new or concerning symptoms.  

## 2021-09-18 DIAGNOSIS — S22000S Wedge compression fracture of unspecified thoracic vertebra, sequela: Secondary | ICD-10-CM | POA: Diagnosis not present

## 2021-09-18 DIAGNOSIS — R0902 Hypoxemia: Secondary | ICD-10-CM | POA: Diagnosis not present

## 2021-09-18 DIAGNOSIS — K5900 Constipation, unspecified: Secondary | ICD-10-CM | POA: Diagnosis not present

## 2021-09-18 DIAGNOSIS — N182 Chronic kidney disease, stage 2 (mild): Secondary | ICD-10-CM | POA: Diagnosis not present

## 2021-09-18 DIAGNOSIS — G894 Chronic pain syndrome: Secondary | ICD-10-CM | POA: Diagnosis not present

## 2021-09-18 DIAGNOSIS — R634 Abnormal weight loss: Secondary | ICD-10-CM | POA: Diagnosis not present

## 2021-09-27 ENCOUNTER — Telehealth: Payer: Self-pay | Admitting: Student

## 2021-09-27 NOTE — Telephone Encounter (Signed)
Ret'd call to patient's daughter-in-law, Justice Rocher and we discussed the Palliative referral/services and all questions were answered and she was in agreement with beginning services with [place].  I have scheduled a MyChart Consult for 10/03/21 @ 2:30 PM ?

## 2021-10-03 ENCOUNTER — Telehealth: Payer: Self-pay | Admitting: Student

## 2021-10-03 DIAGNOSIS — E43 Unspecified severe protein-calorie malnutrition: Secondary | ICD-10-CM | POA: Diagnosis not present

## 2021-10-03 DIAGNOSIS — R52 Pain, unspecified: Secondary | ICD-10-CM

## 2021-10-03 DIAGNOSIS — Z515 Encounter for palliative care: Secondary | ICD-10-CM | POA: Diagnosis not present

## 2021-10-03 DIAGNOSIS — R110 Nausea: Secondary | ICD-10-CM | POA: Diagnosis not present
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
## 2021-10-05 ENCOUNTER — Telehealth: Payer: Self-pay

## 2021-10-05 NOTE — Telephone Encounter (Signed)
132 pm.  Call back received from [facility] with Dr. Carrie Mew.  Okay for hospice referral and PCP will remain AOR while patient is under hospice. ? ?[facility] Intake updated on new referral.  ?

## 2021-10-05 NOTE — Telephone Encounter (Signed)
830 am.  Request received from [facility], NP to obtain a hospice order and see if PCP will continue to serve as attending of record while patient is under hospice care.  Phone call made to PCP office with request.  ?

## 2021-10-18 DIAGNOSIS — N838 Other noninflammatory disorders of ovary, fallopian tube and broad ligament: Secondary | ICD-10-CM | POA: Diagnosis not present

## 2021-10-18 DIAGNOSIS — I774 Celiac artery compression syndrome: Secondary | ICD-10-CM | POA: Diagnosis not present

## 2021-10-18 DIAGNOSIS — K551 Chronic vascular disorders of intestine: Secondary | ICD-10-CM | POA: Diagnosis not present

## 2021-10-18 DIAGNOSIS — R634 Abnormal weight loss: Secondary | ICD-10-CM | POA: Diagnosis not present

## 2021-10-18 DIAGNOSIS — I252 Old myocardial infarction: Secondary | ICD-10-CM | POA: Diagnosis not present

## 2021-10-18 DIAGNOSIS — J9611 Chronic respiratory failure with hypoxia: Secondary | ICD-10-CM | POA: Diagnosis not present

## 2021-10-18 DIAGNOSIS — I2510 Atherosclerotic heart disease of native coronary artery without angina pectoris: Secondary | ICD-10-CM | POA: Diagnosis not present

## 2021-10-19 ENCOUNTER — Encounter: Payer: Self-pay | Admitting: Cardiovascular Disease

## 2021-11-10 ENCOUNTER — Ambulatory Visit (INDEPENDENT_AMBULATORY_CARE_PROVIDER_SITE_OTHER): Payer: Medicare HMO | Admitting: Nurse Practitioner

## 2021-11-10 ENCOUNTER — Encounter (INDEPENDENT_AMBULATORY_CARE_PROVIDER_SITE_OTHER): Payer: Medicare HMO

## 2021-11-15 ENCOUNTER — Ambulatory Visit (INDEPENDENT_AMBULATORY_CARE_PROVIDER_SITE_OTHER): Payer: Medicare HMO | Admitting: Nurse Practitioner

## 2021-11-15 ENCOUNTER — Encounter (INDEPENDENT_AMBULATORY_CARE_PROVIDER_SITE_OTHER): Payer: Medicare HMO

## 2022-01-04 DIAGNOSIS — I252 Old myocardial infarction: Secondary | ICD-10-CM | POA: Diagnosis not present

## 2022-01-04 DIAGNOSIS — J9611 Chronic respiratory failure with hypoxia: Secondary | ICD-10-CM | POA: Diagnosis not present

## 2022-01-04 DIAGNOSIS — I774 Celiac artery compression syndrome: Secondary | ICD-10-CM | POA: Diagnosis not present

## 2022-01-04 DIAGNOSIS — I2510 Atherosclerotic heart disease of native coronary artery without angina pectoris: Secondary | ICD-10-CM | POA: Diagnosis not present

## 2022-01-04 DIAGNOSIS — N838 Other noninflammatory disorders of ovary, fallopian tube and broad ligament: Secondary | ICD-10-CM | POA: Diagnosis not present

## 2022-01-04 DIAGNOSIS — K551 Chronic vascular disorders of intestine: Secondary | ICD-10-CM | POA: Diagnosis not present

## 2022-01-04 DIAGNOSIS — R634 Abnormal weight loss: Secondary | ICD-10-CM | POA: Diagnosis not present

## 2022-01-05 ENCOUNTER — Encounter: Payer: Self-pay | Admitting: Cardiovascular Disease

## 2022-01-05 ENCOUNTER — Ambulatory Visit: Payer: Medicare HMO | Admitting: Cardiovascular Disease

## 2022-01-05 VITALS — BP 158/78 | HR 67 | Ht 62.0 in | Wt 91.5 lb

## 2022-01-05 DIAGNOSIS — I2510 Atherosclerotic heart disease of native coronary artery without angina pectoris: Secondary | ICD-10-CM

## 2022-01-05 DIAGNOSIS — I10 Essential (primary) hypertension: Secondary | ICD-10-CM | POA: Diagnosis not present

## 2022-01-05 DIAGNOSIS — E785 Hyperlipidemia, unspecified: Secondary | ICD-10-CM | POA: Diagnosis not present

## 2022-01-05 MED ORDER — CARVEDILOL 3.125 MG PO TABS: 3.1250 mg | ORAL_TABLET | Freq: Two times a day (BID) | ORAL | 1 refills | Status: DC

## 2022-01-05 NOTE — Patient Instructions (Signed)
Medication Instructions:  Your physician has recommended you make the following change in your medication:   Take an additional 3.125 mg of Carvedilol for systolic blood pressure >789.   *If you need a refill on your cardiac medications before your next appointment, please call your pharmacy*   Lab Work: None ordered If you have labs (blood work) drawn today and your tests are completely normal, you will receive your results only by: [place] (if you have MyChart) OR A paper copy in the mail If you have any lab test that is abnormal or we need to change your treatment, we will call you to review the results.   Testing/Procedures: None ordered   Follow-Up: At [facility], you and your health needs are our priority.  As part of our continuing mission to provide you with exceptional heart care, we have created designated Provider Care Teams.  These Care Teams include your primary Cardiologist (physician) and Advanced Practice Providers (APPs -  Physician Assistants and Nurse Practitioners) who all work together to provide you with the care you need, when you need it.  We recommend signing up for the patient portal called "MyChart".  Sign up information is provided on this After Visit Summary.  MyChart is used to connect with patients for Virtual Visits (Telemedicine).  Patients are able to view lab/test results, encounter notes, upcoming appointments, etc.  Non-urgent messages can be sent to your provider as well.   To learn more about what you can do with MyChart, go to [email or web address].    Your next appointment:   4 month(s)  The format for your next appointment:   In Person  Provider:   You may see [name], MD or one of the following Advanced Practice Providers on your designated Care Team:   Murray Hodgkins, NP Christell Faith, PA-C Cadence Kathlen Mody, [place]  Other Instructions N/A  Important Information About Sugar

## 2022-01-05 NOTE — Progress Notes (Signed)
Cardiology Office Note   Date:  01/05/2022   ID:  Cheryl Hall, DOB [birth year], MRN [id number]  PCP:  Marinda Elk, MD  Cardiologist:   [name], MD   Chief Complaint  Patient presents with   Other    6 month f/u pt would like to discuss BP medication. Meds reviewed verbally with pt.      History of Present Illness: Cheryl Hall is a 81 y.o. 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 She has known history of hypertension, GERD, hyperlipidemia, ongoing tobacco use and chronic kidney disease stage II .  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  In total, she had 3 presentations with non-ST elevation myocardial infarction since May 2016.  Cardiac catheterization was done on each occasion with no evidence of obstructive disease.  Some of these episodes were in the setting of uncontrolled hypertension.  Renal artery duplex showed normal renal arteries. Most recent presentation was in March 2021.  High-sensitivity troponin was elevated to 1500.  Cardiac catheterization was done again and showed moderately calcified coronary arteries with mild to moderate nonobstructive disease.  No significant change since most recent cardiac catheterization.  Ejection fraction was normal with mildly elevated left ventricular end-diastolic pressure.  She was placed on clopidogrel.   She quit smoking after her most recent myocardial infarction.    She had celiac artery drug-coated balloon angioplasty in May of 2022 for GI symptoms but the patient had no significant improvement.  The patient's nutritional status continued to deteriorate and she has been having progressive weight loss.  She is currently under hospice care.  Her blood pressure was intermittently running low and thus we discontinued isosorbide.  The dose of carvedilol was decreased to 3.125 mg twice  daily.  Past Medical History:  Diagnosis Date   Altered mental status    Anemia    Anxiety    [name] colitis    Chronic back pain    CKD (chronic kidney disease), stage II    Coronary artery disease    a. 09/2014 NSTEMI/Cath: moderate 2-vessel disease;  b. 09/2016 NSTEMI/Cath: LM 30, LAD 50p, 39m LCX nl, RCA 495m50d, EF 55-65%.   Diverticulitis    Enthesopathy of hip region    Essential hypertension    GERD (gastroesophageal reflux disease)    H/O Vertebral Fractures    History of colonoscopy    History of echocardiogram    a. 09/2016 Echo: EF 55-60%, no rwma, mildly dil Ao root (3.7cm) and Asc Ao (3.9cm), mild to mod TR.   History of esophagogastroduodenoscopy (EGD)    History of mania    History of sciatica    History of tobacco abuse    Hyperlipidemia    Lumbago    Osteopetrosis    Peripheral neuropathy    Vitamin D deficiency     Past Surgical History:  Procedure Laterality Date   CARDIAC CATHETERIZATION N/A 10/21/2014   Procedure: Left Heart Cath and Coronary Angiography;  Surgeon: MuWellington HampshireMD;  [facility];  Service: Cardiovascular;  Laterality: N/A;   CARDIAC CATHETERIZATION  10/21/2014   Dr. ArFletcher Anon CHOLECYSTECTOMY     COLONOSCOPY WITH PROPOFOL N/A 02/20/2021   Procedure: COLONOSCOPY WITH PROPOFOL;  Surgeon: LoLesly RubensteinMD;  [facility];  Service: Endoscopy;  Laterality: N/A;   ESOPHAGOGASTRODUODENOSCOPY N/A 02/20/2021   Procedure: ESOPHAGOGASTRODUODENOSCOPY (EGD);  Surgeon: Lesly Rubenstein, MD;  Location: [facility] ENDOSCOPY;  Service: Endoscopy;  Laterality: N/A;   LEFT HEART CATH AND CORONARY ANGIOGRAPHY N/A 10/09/2016   Procedure: Left Heart Cath and Coronary Angiography;  Surgeon: Nelva Bush, MD;  Location: [place] [facility];  Service: Cardiovascular;  Laterality: N/A;   LEFT HEART CATH AND CORONARY ANGIOGRAPHY N/A 08/17/2019   Procedure: LEFT HEART CATH AND CORONARY ANGIOGRAPHY;  Surgeon: Wellington Hampshire, MD;  Location: [place] [facility];  Service: Cardiovascular;  Laterality: N/A;   VISCERAL ANGIOGRAPHY N/A 07/28/2020   Procedure: VISCERAL ANGIOGRAPHY;  Surgeon: Algernon Huxley, MD;  Location: [place] [facility];  Service: Cardiovascular;  Laterality: N/A;   VISCERAL ANGIOGRAPHY N/A 09/29/2020   Procedure: VISCERAL ANGIOGRAPHY;  Surgeon: Algernon Huxley, MD;  Location: [facility];  Service: Cardiovascular;  Laterality: N/A;     Current Outpatient Medications  Medication Sig Dispense Refill   carvedilol (COREG) 3.125 MG tablet Take 3.125 mg by mouth 2 (two) times daily with a meal.     Cholecalciferol (VITAMIN D-3 PO) Take 1,000 Units by mouth daily.     HYDROcodone-acetaminophen (NORCO/VICODIN) 5-325 MG tablet Take 1 tablet by mouth every 6 (six) hours as needed for moderate pain.     nitroGLYCERIN (NITROSTAT) 0.4 MG SL tablet Place 0.4 mg under the tongue every 5 (five) minutes as needed for chest pain.     pantoprazole (PROTONIX) 40 MG tablet Take 1 tablet (40 mg total) by mouth daily. (Patient taking differently: Take 40 mg by mouth 2 (two) times daily.) 30 tablet 5   vitamin B-12 (CYANOCOBALAMIN) 1000 MCG tablet Take 1,000 mcg by mouth daily.     acetaminophen (TYLENOL) 500 MG tablet Take 500 mg by mouth every 6 (six) hours as needed for mild pain or headache. (Patient not taking: Reported on 01/05/2022)     aspirin EC 81 MG EC tablet Take 1 tablet (81 mg total) by mouth daily. (Patient not taking: Reported on 01/05/2022) 30 tablet 3   atorvastatin (LIPITOR) 80 MG tablet TAKE 1 TABLET BY MOUTH ONCE DAILY AT  6PM (Patient not taking: Reported on 01/05/2022) 90 tablet 3   hydrochlorothiazide (HYDRODIURIL) 12.5 MG tablet Take 12.5 mg by mouth daily.  (Patient not taking: Reported on 01/05/2022)     isosorbide mononitrate (IMDUR) 30 MG 24 hr tablet Take 0.5 tablets (15 mg total) by mouth daily. (Patient not taking: Reported on 01/05/2022) 45 tablet 3   lidocaine (LIDODERM) 5 % Place  1 patch onto the skin every 12 (twelve) hours as needed (As needed for pain). Remove & Discard patch within 12 hours or as directed by MD (Patient not taking: Reported on 01/05/2022) 60 patch 3   lidocaine (LIDODERM) 5 % Place 1 patch onto the skin every 12 (twelve) hours. Remove & Discard patch within 12 hours or as directed by MD (Patient not taking: Reported on 01/05/2022) 10 patch 0   traMADol (ULTRAM) 50 MG tablet Take 50 mg by mouth every 6 (six) hours as needed. (Patient not taking: Reported on 01/05/2022)     No current facility-administered medications for this visit.    Allergies:   Patient has no known allergies.    Social History:  The patient  reports that she has been smoking cigarettes. She has a 12.50 pack-year smoking history. She has never used smokeless tobacco. She reports that she does not drink alcohol and does not use drugs.   Family History:  The patient's family history  includes CAD in her brother; Cancer in her father; Other in her mother.    ROS:  Please see the history of present illness.   Otherwise, review of systems are positive for none.   All other systems are reviewed and negative.    PHYSICAL EXAM: VS:  BP (!) 152/85 (BP Location: Left Arm, Patient Position: Sitting, Cuff Size: Normal)   Pulse 67   Ht '5\' 2"'$  (1.575 m)   Wt 91 lb 8 oz (41.5 kg)   SpO2 (!) 86%   BMI 16.74 kg/m  , BMI Body mass index is 16.74 kg/m. GEN: Malnourished and in no acute distress. HEENT: normal  Neck: no JVD, carotid bruits, or masses Cardiac: RRR; no murmurs, rubs, or gallops,no edema  Respiratory: Diminished breath sounds bilaterally. GI: soft, nontender, nondistended, + BS MS: no deformity or atrophy  Skin: warm and dry, no rash Neuro:  Strength and sensation are intact Psych: euthymic mood, full affect   EKG:  EKG is ordered today. The ekg ordered today demonstrates n normal sinus rhythm with no significant ST or T wave changes.  Recent Labs: 09/15/2021: ALT 9; BUN  14; Creatinine, Ser 0.99; Hemoglobin 10.3; Platelets 129; Potassium 4.2; Sodium 136    Lipid Panel    Component Value Date/Time   CHOL 150 08/18/2019 0428   CHOL 149 01/31/2012 0454   TRIG 91 08/18/2019 0428   TRIG 111 01/31/2012 0454   HDL 51 08/18/2019 0428   HDL 58 01/31/2012 0454   CHOLHDL 2.9 08/18/2019 0428   VLDL 18 08/18/2019 0428   VLDL 22 01/31/2012 0454   LDLCALC 81 08/18/2019 0428   LDLCALC 69 01/31/2012 0454      Wt Readings from Last 3 Encounters:  01/05/22 91 lb 8 oz (41.5 kg)  09/15/21 98 lb (44.5 kg)  08/26/21 110 lb 3.7 oz (50 kg)        ASSESSMENT AND PLAN:  1.  Coronary artery disease involving native coronary arteries with recurrent myocardial infarction without obstructive disease.  Currently with no anginal symptoms.   she is now under hospice care given gradual decline in functional capacity and continued weight loss.  She is down to 91 pounds.  2. Essential hypertension: She was having episodes of low blood pressure that improved after stopping isosorbide and decreasing carvedilol.  The plan is to continue carvedilol 3.125 mg twice daily and take an additional dose only if systolic blood pressure is above 180.  3. Hyperlipidemia: She is no longer on atorvastatin for hyperlipidemia since she has been under hospice.  4.  Previous tobacco use: She is not smoking at the present time.  Disposition:   FU with me in 4 months  Signed,  [name], MD  01/05/2022 8:40 AM    [place]

## 2022-02-14 IMAGING — CR DG CHEST 2V
1 series · 2 of 2 positions shown · non-contrast
Comparison: August 17, 2019

CLINICAL DATA: Chest pain

EXAM:
CHEST - 2 VIEW

[Series 1: dg chest 2 view · 0.14mm/px · 2 of 2 slices shown]
[im 1/2]
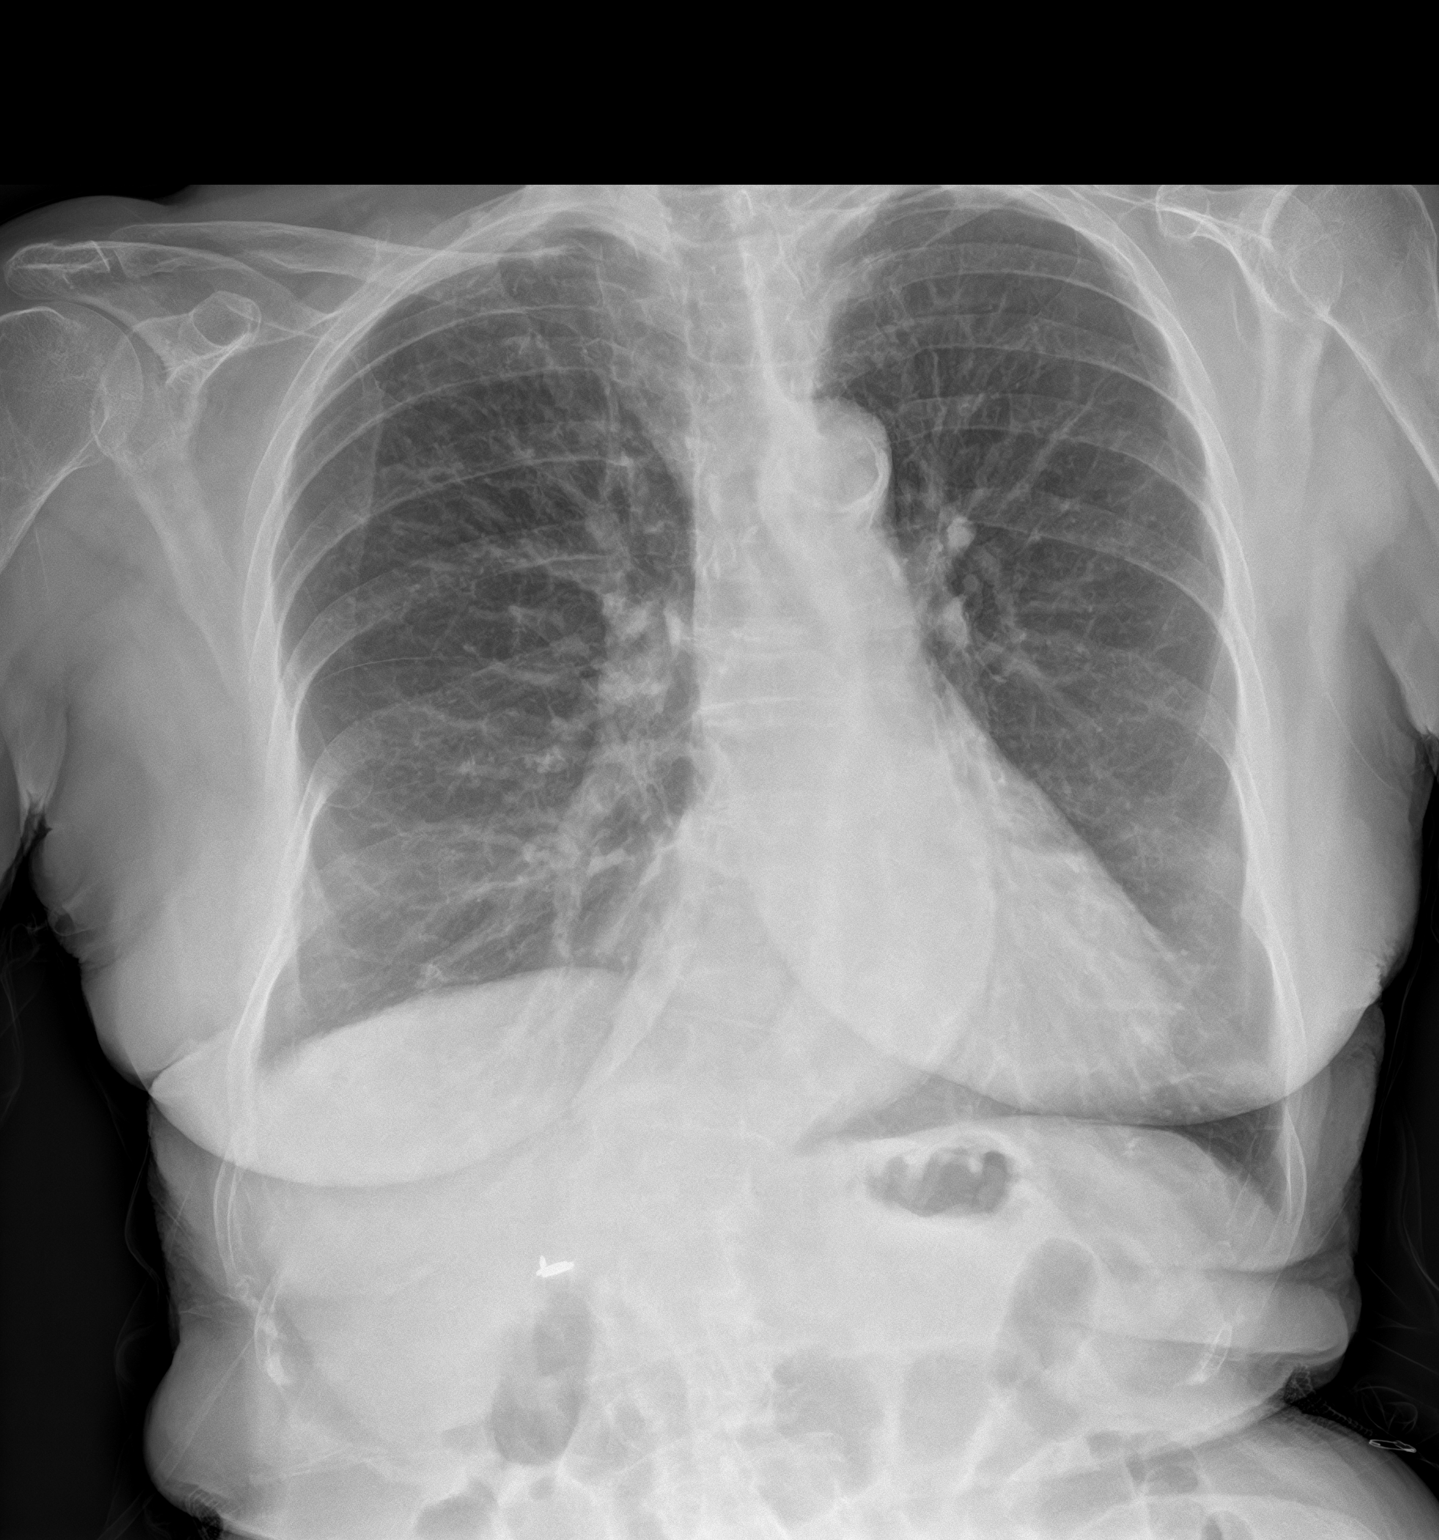
[im 2/2]
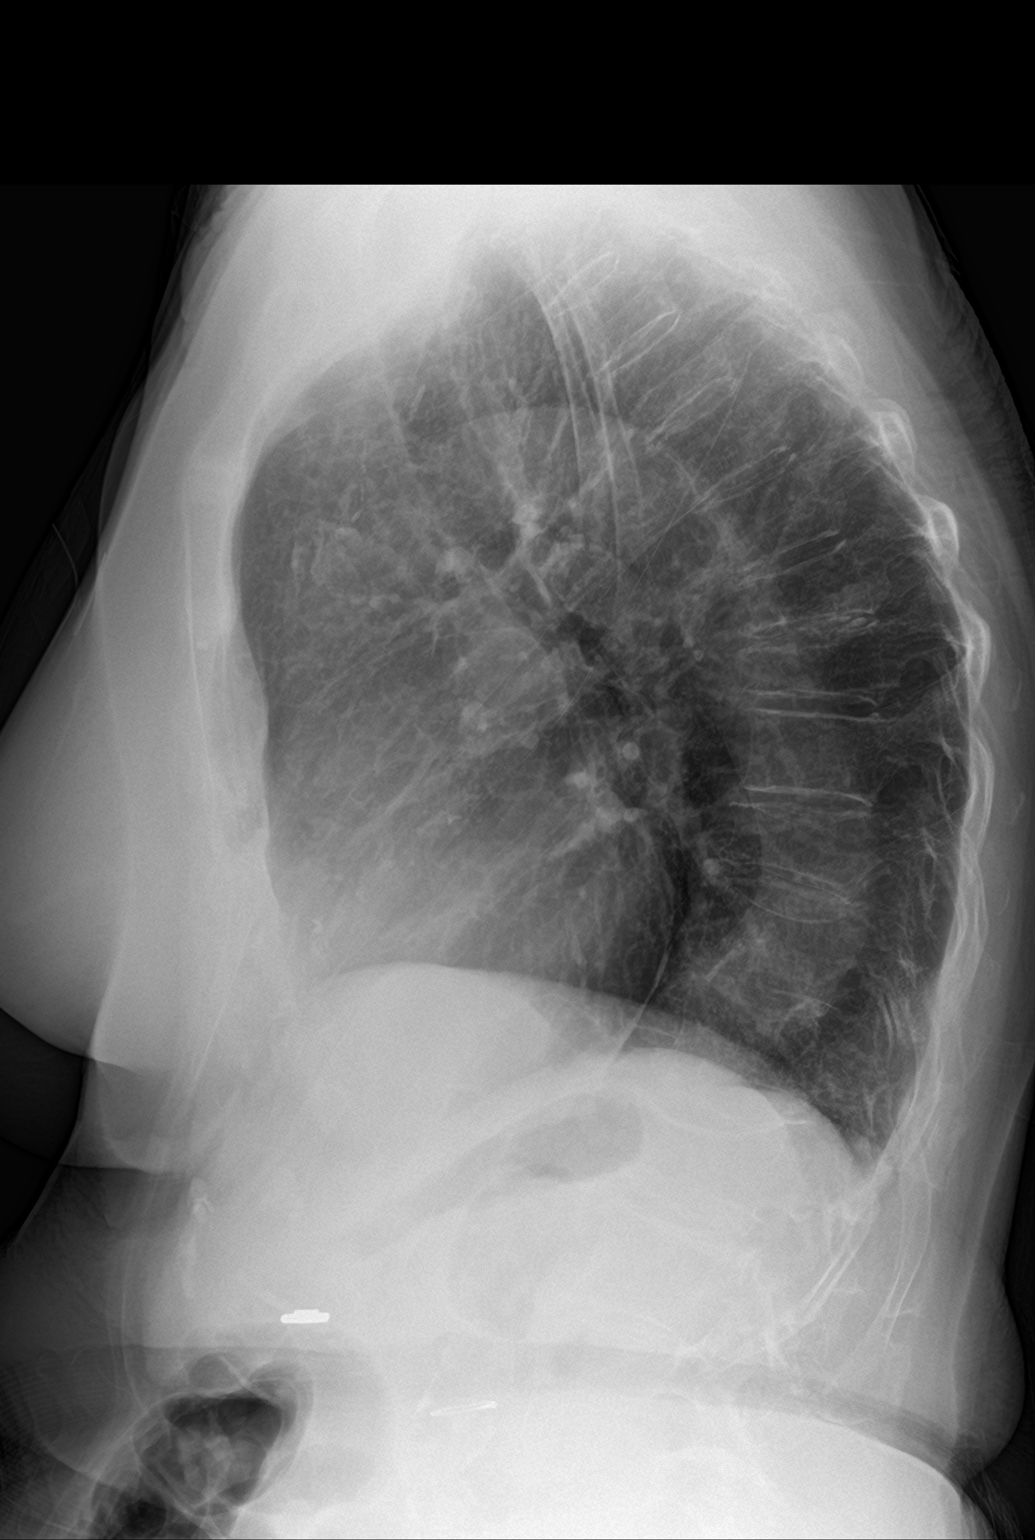

[2 of 2 positions shown; findings below may reference images not displayed]

FINDINGS: There is mild atelectasis in the left base. The lungs elsewhere are
clear. Heart size and pulmonary vascularity are normal. No
adenopathy. Aorta is tortuous with atherosclerotic calcification.
There is thoracolumbar dextroscoliosis. There is anterior wedging of
several mid and lower thoracic vertebral bodies, stable. No
pneumothorax.
IMPRESSION: Left base atelectasis. No edema or airspace opacity. Stable cardiac
silhouette. Aorta tortuous with aortic atherosclerosis. Stable wedge
fractures at several levels.

Aortic Atherosclerosis (BIRQN-PW7.7).

## 2022-03-26 ENCOUNTER — Encounter (INDEPENDENT_AMBULATORY_CARE_PROVIDER_SITE_OTHER): Payer: Self-pay

## 2022-04-17 DIAGNOSIS — I25110 Atherosclerotic heart disease of native coronary artery with unstable angina pectoris: Secondary | ICD-10-CM | POA: Diagnosis not present

## 2022-04-17 DIAGNOSIS — I774 Celiac artery compression syndrome: Secondary | ICD-10-CM | POA: Diagnosis not present

## 2022-04-17 DIAGNOSIS — J9611 Chronic respiratory failure with hypoxia: Secondary | ICD-10-CM | POA: Diagnosis not present

## 2022-04-17 DIAGNOSIS — R634 Abnormal weight loss: Secondary | ICD-10-CM | POA: Diagnosis not present

## 2022-04-17 DIAGNOSIS — N838 Other noninflammatory disorders of ovary, fallopian tube and broad ligament: Secondary | ICD-10-CM | POA: Diagnosis not present

## 2022-04-17 DIAGNOSIS — I252 Old myocardial infarction: Secondary | ICD-10-CM | POA: Diagnosis not present

## 2022-04-17 DIAGNOSIS — K551 Chronic vascular disorders of intestine: Secondary | ICD-10-CM | POA: Diagnosis not present

## 2022-05-07 ENCOUNTER — Ambulatory Visit: Payer: Medicare HMO | Admitting: Physician Assistant

## 2022-06-03 ENCOUNTER — Emergency Department
Admission: EM | Admit: 2022-06-03 | Discharge: 2022-06-03 | Disposition: A | Discharge status: Home / Self Care | Payer: Medicare Other | Attending: Emergency Medicine | Admitting: Emergency Medicine

## 2022-06-03 ENCOUNTER — Other Ambulatory Visit: Payer: Self-pay

## 2022-06-03 ENCOUNTER — Emergency Department: Payer: Medicare Other

## 2022-06-03 DIAGNOSIS — Z23 Encounter for immunization: Secondary | ICD-10-CM | POA: Diagnosis not present

## 2022-06-03 DIAGNOSIS — W5501XA Bitten by cat, initial encounter: Secondary | ICD-10-CM | POA: Diagnosis not present

## 2022-06-03 DIAGNOSIS — S51811A Laceration without foreign body of right forearm, initial encounter: Secondary | ICD-10-CM | POA: Insufficient documentation

## 2022-06-03 DIAGNOSIS — Z203 Contact with and (suspected) exposure to rabies: Secondary | ICD-10-CM | POA: Diagnosis not present

## 2022-06-03 DIAGNOSIS — Z2914 Encounter for prophylactic rabies immune globin: Secondary | ICD-10-CM | POA: Insufficient documentation

## 2022-06-03 DIAGNOSIS — S51851A Open bite of right forearm, initial encounter: Secondary | ICD-10-CM | POA: Diagnosis not present

## 2022-06-03 MED ORDER — RABIES VACCINE, PCEC IM SUSR
1.0000 mL | Freq: Once | INTRAMUSCULAR | Status: AC
  Administered 2022-06-03: 1 mL via INTRAMUSCULAR
  Filled 2022-06-03: qty 1

## 2022-06-03 MED ORDER — LIDOCAINE-EPINEPHRINE 2 %-1:100000 IJ SOLN
20.0000 mL | Freq: Once | INTRAMUSCULAR | Status: AC
  Administered 2022-06-03: 20 mL
  Filled 2022-06-03: qty 1

## 2022-06-03 MED ORDER — TETANUS-DIPHTH-ACELL PERTUSSIS 5-2.5-18.5 LF-MCG/0.5 IM SUSY
0.5000 mL | PREFILLED_SYRINGE | Freq: Once | INTRAMUSCULAR | Status: AC
Start: 2022-06-03 — End: 2022-06-03
  Administered 2022-06-03: 0.5 mL via INTRAMUSCULAR
  Filled 2022-06-03: qty 0.5

## 2022-06-03 MED ORDER — RABIES IMMUNE GLOBULIN 150 UNIT/ML IM INJ
20.0000 [IU]/kg | INJECTION | Freq: Once | INTRAMUSCULAR | Status: AC
  Administered 2022-06-03: 825 [IU] via INTRAMUSCULAR
  Filled 2022-06-03: qty 6

## 2022-06-03 MED ORDER — AMOXICILLIN-POT CLAVULANATE 875-125 MG PO TABS: 1.0000 | ORAL_TABLET | Freq: Two times a day (BID) | ORAL | 0 refills | Status: AC

## 2022-06-03 NOTE — Discharge Instructions (Signed)
You were evaluated in the emergency department for a laceration. It was repaired with sutures. Keep the area clean and dry.  Wash multiple times per day with soap and water.  Do not go into the ocean or swimming pool.  Return to the emergency department or your primary care physician's office in 7-10 days for suture removal.  Return to the emergency department for:  -- Fever > 100.82F -- Increase pain in the wound -- Increase redness and swelling -- Pus coming from the wound -- Wound bleeds more than a small amount or it does not stop -- Wound edges come apart -- Severe pain -- Weakness or numbness in the affected area  Or any other new or worsening symptoms. It was a pleasure caring for you.  You were given the first dose of the rabies vaccination. You must get a repeat immunoglobulin on days 3, 7, and 14. Today is considered day 0. You may go to the [place] or [place] [facility] care for this:  [facility] at [facility], open Monday through Friday 8am-4pm with online scheduling. [facility] at [facility] [address] [place]. Open Monday-Friday 8am-8pm, Saturday and Sunday 8am-4pm with online scheduling or walk in.  Return for any new, worsening, or change in symptoms or other concerns. It was a pleasure caring for you.

## 2022-06-03 NOTE — ED Provider Notes (Signed)
[facility] Provider Note    Event Date/Time   First MD Initiated Contact with Patient 06/03/22 1046     (approximate)   History   Animal Bite   HPI  Cheryl Hall is a 82 y.o. female who presents today for evaluation of cat bite that occurred this morning.  Patient reports that she feeds a stray cat daily, but today it bit her arm.  She reports that she is unsure of her last tetanus shot.  She reports that she has sustained a large laceration from this.  She denies numbness or tingling.  She would like to proceed with the rabies vaccination.  She presents with her son.  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   Acute renal insufficiency 10/10/2016   Hyperglycemia 10/10/2016   Thrombocytopenia ([place]) 10/10/2016   CAD in native artery 10/09/2016   Aortic atherosclerosis ([place]) 09/18/2016   Colitis    Lower GI bleed    Acute colitis 08/18/2016   Coronary artery disease involving native coronary artery without angina pectoris 11/15/2014   Hyperlipidemia    Essential hypertension    Tobacco abuse    CKD (chronic kidney disease), stage II    GERD (gastroesophageal reflux disease)    NSTEMI (non-ST elevated myocardial infarction) ([place]) 22/97/9892   Salicylate poisoning 11/94/1740   Altered mental status 10/16/2013          Physical Exam   Triage Vital Signs: ED Triage Vitals  Enc Vitals Group     BP 06/03/22 1036 (!) 145/77     Pulse Rate 06/03/22 1036 84     Resp 06/03/22 1036 20     Temp 06/03/22 1036 98.6 F (37 C)     Temp src --      SpO2 06/03/22 1036 98 %     Weight 06/03/22 1037 91 lb 7.9 oz (41.5 kg)     Height --      Head Circumference --      Peak Flow --      Pain Score 06/03/22 1036  0     Pain Loc --      Pain Edu? --      Excl. in [place]? --     Most recent vital signs: Vitals:   06/03/22 1036  BP: (!) 145/77  Pulse: 84  Resp: 20  Temp: 98.6 F (37 C)  SpO2: 98%    Physical Exam Vitals and nursing note reviewed.  Constitutional:      General: Awake and alert. No acute distress.    Appearance: Normal appearance. The patient is normal weight.  HENT:     Head: Normocephalic and atraumatic.     Mouth: Mucous membranes are moist.  Eyes:     General: PERRL. Normal EOMs        Right eye: No discharge.        Left eye: No discharge.     Conjunctiva/sclera: Conjunctivae normal.  Cardiovascular:     Rate and Rhythm: Normal rate and regular rhythm.     Pulses: Normal pulses.  Pulmonary:     Effort: Pulmonary effort is normal. No respiratory distress.     Breath sounds: Normal breath sounds.  On 2 L nasal cannula which is her baseline Abdominal:  Abdomen is soft. There is no abdominal tenderness. No rebound or guarding. No distention. Musculoskeletal:        General: No swelling. Normal range of motion.     Cervical back: Normal range of motion and neck supple.  Left forearm with 6 cm curvilinear laceration with skin flap with bright red blood.  No visible foreign body.  Full and normal range of motion at the level of the wrist, fingers, elbow. Skin:    General: Skin is warm and dry.     Capillary Refill: Capillary refill takes less than 2 seconds.     Findings: No rash.  Neurological:     Mental Status: The patient is awake and alert.      ED Results / Procedures / Treatments   Labs (all labs ordered are listed, but only abnormal results are displayed) Labs Reviewed - No data to display   EKG     RADIOLOGY I independently reviewed and interpreted imaging and agree with radiologists findings.     PROCEDURES:  Critical Care performed:   Marland KitchenMarland KitchenLaceration Repair  Date/Time: 06/03/2022 2:42 PM  Performed by: Marquette Old, PA-C Authorized  by: Marquette Old, PA-C   Consent:    Consent obtained:  Verbal   Consent given by:  Patient   Risks, benefits, and alternatives were discussed: yes     Risks discussed:  Infection, need for additional repair, nerve damage, poor wound healing, poor cosmetic result, pain, retained foreign body, tendon damage and vascular damage   Alternatives discussed:  No treatment Universal protocol:    Procedure explained and questions answered to patient or proxy's satisfaction: yes     Relevant documents present and verified: yes     Test results available: yes     Imaging studies available: yes     Required blood products, implants, devices, and special equipment available: yes     Site/side marked: yes     Immediately prior to procedure, a time out was called: yes     Patient identity confirmed:  Verbally with patient Anesthesia:    Anesthesia method:  Local infiltration   Local anesthetic:  Lidocaine 2% WITH epi Laceration details:    Location:  Shoulder/arm   Shoulder/arm location:  R lower arm   Length (cm):  7.5   Depth (mm):  4 Pre-procedure details:    Preparation:  Patient was prepped and draped in usual sterile fashion and imaging obtained to evaluate for foreign bodies Exploration:    Limited defect created (wound extended): no     Hemostasis achieved with:  Direct pressure   Imaging obtained: x-ray     Imaging outcome: foreign body noted     Wound exploration: wound explored through full range of motion and entire depth of wound visualized     Wound extent: no foreign body, no signs of injury, no nerve damage, no tendon damage and no underlying fracture   Treatment:    Area cleansed with:  Saline   Amount of cleaning:  Extensive   Irrigation solution:  Sterile saline   Irrigation method:  Pressure wash and syringe   Debridement:  None   Undermining:  None   Scar revision: no   Skin repair:    Repair method:  Sutures   Suture size:  5-0   Suture material:  Nylon   Suture  technique:  Simple interrupted Approximation:    Approximation:  Loose Repair type:    Repair type:  Intermediate Post-procedure details:  Dressing:  Non-adherent dressing   Procedure completion:  Tolerated well, no immediate complications    MEDICATIONS ORDERED IN ED: Medications  rabies immune globulin (HYPERRAB/KEDRAB) injection 825 Units (825 Units Intramuscular Given 06/03/22 1228)  rabies vaccine (RABAVERT) injection 1 mL (1 mL Intramuscular Given 06/03/22 1220)  Tdap (BOOSTRIX) injection 0.5 mL (0.5 mLs Intramuscular Given 06/03/22 1226)  lidocaine-EPINEPHrine (XYLOCAINE W/EPI) 2 %-1:100000 (with pres) injection 20 mL (20 mLs Infiltration Given 06/03/22 1352)     IMPRESSION / MDM / ASSESSMENT AND PLAN / ED COURSE  I reviewed the triage vital signs and the nursing notes.   Differential diagnosis includes, but is not limited to, cat bite, laceration, retained foreign body, bony injury, skin tear.  Wound was anesthetized and irrigated extensively given that it was a cat bite.  Given the size and depth, it was closed loosely with sutures.  Patient and her son were educated on suture care and timeline for removal.  Her tetanus was also updated and we discussed the option of rabies vaccination with patient and her son would like to proceed with.  We discussed the timeline for further rabies vaccinations.  She was also started on antibiotics.  We discussed very strict return precautions and the way to care for this wound given the high risk for infection given that it is a cat bite.  Patient and son understand and agree with plan.  Discharged in stable condition.   Patient's presentation is most consistent with acute complicated illness / injury requiring diagnostic workup.    FINAL CLINICAL IMPRESSION(S) / ED DIAGNOSES   Final diagnoses:  Cat bite, initial encounter  Laceration of right forearm, initial encounter     Rx / DC Orders   ED Discharge Orders          Ordered     amoxicillin-clavulanate (AUGMENTIN) 875-125 MG tablet  2 times daily        06/03/22 1317             Note:  This document was prepared using Dragon voice recognition software and may include unintentional dictation errors.   Marquette Old, PA-C 06/03/22 1445    Nathaniel Man, MD 06/03/22 1520

## 2022-06-03 NOTE — ED Triage Notes (Signed)
As per son, pt has been feeding a stray cat for the last year, but today the cat attacked her right arm, possible biting it. Pt has skin tear. Wet gauze with kerlex placed in triage.  Son states they have not called animal control

## 2022-06-06 ENCOUNTER — Ambulatory Visit
Admission: EM | Admit: 2022-06-06 | Discharge: 2022-06-06 | Disposition: A | Discharge status: Home / Self Care | Payer: Medicare HMO | Attending: Internal Medicine | Admitting: Internal Medicine

## 2022-06-06 DIAGNOSIS — Z203 Contact with and (suspected) exposure to rabies: Secondary | ICD-10-CM

## 2022-06-06 DIAGNOSIS — Z23 Encounter for immunization: Secondary | ICD-10-CM

## 2022-06-06 MED ORDER — RABIES VACCINE, PCEC IM SUSR
1.0000 mL | Freq: Once | INTRAMUSCULAR | Status: AC
  Administered 2022-06-06: 1 mL via INTRAMUSCULAR

## 2022-06-06 NOTE — ED Notes (Addendum)
Pt. Has received day 3 shot 1 of her follow up rabies vaccination. Pt. Tolerated it well. Pt.states she has her has follow up information for following vaccinations and does not need a copy.

## 2022-06-11 ENCOUNTER — Ambulatory Visit
Admission: EM | Admit: 2022-06-11 | Discharge: 2022-06-11 | Disposition: A | Discharge status: Home / Self Care | Payer: Medicare HMO | Attending: Internal Medicine | Admitting: Internal Medicine

## 2022-06-11 ENCOUNTER — Other Ambulatory Visit: Payer: Self-pay

## 2022-06-11 ENCOUNTER — Encounter: Payer: Self-pay | Admitting: Emergency Medicine

## 2022-06-11 DIAGNOSIS — N838 Other noninflammatory disorders of ovary, fallopian tube and broad ligament: Secondary | ICD-10-CM | POA: Diagnosis not present

## 2022-06-11 DIAGNOSIS — I774 Celiac artery compression syndrome: Secondary | ICD-10-CM | POA: Diagnosis not present

## 2022-06-11 DIAGNOSIS — Z23 Encounter for immunization: Secondary | ICD-10-CM

## 2022-06-11 DIAGNOSIS — Z203 Contact with and (suspected) exposure to rabies: Secondary | ICD-10-CM

## 2022-06-11 DIAGNOSIS — R634 Abnormal weight loss: Secondary | ICD-10-CM | POA: Diagnosis not present

## 2022-06-11 DIAGNOSIS — J9611 Chronic respiratory failure with hypoxia: Secondary | ICD-10-CM | POA: Diagnosis not present

## 2022-06-11 DIAGNOSIS — I25110 Atherosclerotic heart disease of native coronary artery with unstable angina pectoris: Secondary | ICD-10-CM | POA: Diagnosis not present

## 2022-06-11 DIAGNOSIS — I252 Old myocardial infarction: Secondary | ICD-10-CM | POA: Diagnosis not present

## 2022-06-11 DIAGNOSIS — K551 Chronic vascular disorders of intestine: Secondary | ICD-10-CM | POA: Diagnosis not present

## 2022-06-11 MED ORDER — RABIES VACCINE, PCEC IM SUSR
1.0000 mL | Freq: Once | INTRAMUSCULAR | Status: AC
  Administered 2022-06-11: 1 mL via INTRAMUSCULAR

## 2022-06-11 NOTE — Discharge Instructions (Addendum)
Return for remainder of rabies series and return as instructed for suture removal.  Return sooner for any concerns

## 2022-06-11 NOTE — ED Triage Notes (Signed)
Patient is requesting a dressing change .  Patient reports Saturday was the last time dressing was changed

## 2022-06-18 ENCOUNTER — Ambulatory Visit
Admission: EM | Admit: 2022-06-18 | Discharge: 2022-06-18 | Disposition: A | Discharge status: Home / Self Care | Payer: Medicare HMO | Attending: Emergency Medicine | Admitting: Emergency Medicine

## 2022-06-18 DIAGNOSIS — Z23 Encounter for immunization: Secondary | ICD-10-CM | POA: Diagnosis not present

## 2022-06-18 DIAGNOSIS — Z203 Contact with and (suspected) exposure to rabies: Secondary | ICD-10-CM

## 2022-06-18 DIAGNOSIS — Z7689 Persons encountering health services in other specified circumstances: Secondary | ICD-10-CM

## 2022-06-18 DIAGNOSIS — Z4802 Encounter for removal of sutures: Secondary | ICD-10-CM

## 2022-06-18 DIAGNOSIS — S51811D Laceration without foreign body of right forearm, subsequent encounter: Secondary | ICD-10-CM | POA: Diagnosis not present

## 2022-06-18 DIAGNOSIS — I10 Essential (primary) hypertension: Secondary | ICD-10-CM | POA: Diagnosis not present

## 2022-06-18 MED ORDER — RABIES VACCINE, PCEC IM SUSR
1.0000 mL | Freq: Once | INTRAMUSCULAR | Status: AC
  Administered 2022-06-18: 1 mL via INTRAMUSCULAR

## 2022-06-18 NOTE — ED Provider Notes (Signed)
Cheryl Hall    CSN: [id number] Arrival date & time: 06/18/22  1232      History   Chief Complaint Chief Complaint  Patient presents with   Suture / Staple Removal   Follow-up    HPI Cheryl Hall is a 82 y.o. female.  Accompanied by her daughter-in-law, patient presents for suture removal and rabies shot.  No wound drainage, redness, fever, or other symptoms.  Patient was seen in the ED on 06/03/2022; diagnosed with cat bite and laceration of right forearm; tetanus updated and patient started on rabies series; laceration closed loosely with sutures; treated with Augmentin.    The history is provided by the patient, a relative and medical records.    Past Medical History:  Diagnosis Date   Altered mental status    Anemia    Anxiety    [name] colitis    Chronic back pain    CKD (chronic kidney disease), stage II    Coronary artery disease    a. 09/2014 NSTEMI/Cath: moderate 2-vessel disease;  b. 09/2016 NSTEMI/Cath: LM 30, LAD 50p, 16m LCX nl, RCA 438m50d, EF 55-65%.   Diverticulitis    Enthesopathy of hip region    Essential hypertension    GERD (gastroesophageal reflux disease)    H/O Vertebral Fractures    History of colonoscopy    History of echocardiogram    a. 09/2016 Echo: EF 55-60%, no rwma, mildly dil Ao root (3.7cm) and Asc Ao (3.9cm), mild to mod TR.   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   Acute renal insufficiency 10/10/2016   Hyperglycemia 10/10/2016   Thrombocytopenia ([facility] 10/10/2016   CAD in native artery 10/09/2016   Aortic atherosclerosis (HCPurcell04/24/2018   Colitis    Lower GI bleed    Acute colitis 08/18/2016   Coronary artery disease involving native coronary artery without angina pectoris 11/15/2014   Hyperlipidemia    Essential hypertension    Tobacco abuse    CKD (chronic kidney disease), stage II    GERD (gastroesophageal reflux disease)    NSTEMI (non-ST elevated myocardial infarction) ([place]0590/30/0923 Salicylate poisoning 0530/11/6224 Altered mental status 10/16/2013    Past Surgical History:  Procedure Laterality Date   CARDIAC CATHETERIZATION N/A 10/21/2014   Procedure: Left Heart Cath and Coronary Angiography;  Surgeon: MuWellington HampshireMD;  [facility];  Service: Cardiovascular;  Laterality: N/A;   CARDIAC CATHETERIZATION  10/21/2014   Dr. ArFletcher Anon CHOLECYSTECTOMY     COLONOSCOPY WITH PROPOFOL N/A 02/20/2021   Procedure: COLONOSCOPY WITH PROPOFOL;  Surgeon: LoLesly RubensteinMD;  [facility];  Service: Endoscopy;  Laterality: N/A;   ESOPHAGOGASTRODUODENOSCOPY N/A 02/20/2021   Procedure: ESOPHAGOGASTRODUODENOSCOPY (EGD);  Surgeon: LoLesly RubensteinMD;  [facility];  Service: Endoscopy;  Laterality: N/A;   LEFT HEART CATH AND CORONARY ANGIOGRAPHY N/A 10/09/2016   Procedure: Left Heart Cath and Coronary Angiography;  Surgeon: EnNelva Bush  MD;  Location: [place] [facility] LAB;  Service: Cardiovascular;  Laterality: N/A;   LEFT HEART CATH AND CORONARY ANGIOGRAPHY N/A 08/17/2019   Procedure: LEFT HEART CATH AND CORONARY ANGIOGRAPHY;  Surgeon: Wellington Hampshire, MD;  Location: [place] [facility];  Service: Cardiovascular;  Laterality: N/A;   VISCERAL ANGIOGRAPHY N/A 07/28/2020   Procedure: VISCERAL ANGIOGRAPHY;  Surgeon: Algernon Huxley, MD;  Location: [place] [facility];  Service: Cardiovascular;  Laterality: N/A;   VISCERAL ANGIOGRAPHY N/A 09/29/2020   Procedure: VISCERAL ANGIOGRAPHY;   Surgeon: Algernon Huxley, MD;  Location: [place] [facility];  Service: Cardiovascular;  Laterality: N/A;    OB History   No obstetric history on file.      Home Medications    Prior to Admission medications   Medication Sig Start Date End Date Taking? Authorizing Provider  acetaminophen (TYLENOL) 500 MG tablet Take 500 mg by mouth every 6 (six) hours as needed for mild pain or headache. Patient not taking: Reported on 01/05/2022    [provider]  aspirin EC 81 MG EC tablet Take 1 tablet (81 mg total) by mouth daily. Patient not taking: Reported on 01/05/2022 10/11/16   Theodoro Grist, MD  atorvastatin (LIPITOR) 80 MG tablet TAKE 1 TABLET BY MOUTH ONCE DAILY AT  6PM Patient not taking: Reported on 01/05/2022 01/23/21   Wellington Hampshire, MD  carvedilol (COREG) 3.125 MG tablet Take 1 tablet (3.125 mg total) by mouth 2 (two) times daily with a meal. Take an additional 3.125 mg daily for systolic BP >161 0/96/04   Wellington Hampshire, MD  Cholecalciferol (VITAMIN D-3 PO) Take 1,000 Units by mouth daily.    [provider]  hydrochlorothiazide (HYDRODIURIL) 12.5 MG tablet Take 12.5 mg by mouth daily.  Patient not taking: Reported on 01/05/2022    [provider]  HYDROcodone-acetaminophen (NORCO/VICODIN) 5-325 MG tablet Take 1 tablet by mouth every 6 (six) hours as needed for moderate pain.    [provider]  isosorbide mononitrate (IMDUR) 30 MG 24 hr tablet Take 0.5 tablets (15 mg total) by mouth daily. Patient not taking: Reported on 01/05/2022 05/08/21 08/06/21  Rise Mu, PA-C  lidocaine (LIDODERM) 5 % Place 1 patch onto the skin every 12 (twelve) hours as needed (As needed for pain). Remove & Discard patch within 12 hours or as directed by MD Patient not taking: Reported on 01/05/2022 05/08/21   Rise Mu, PA-C  lidocaine (LIDODERM) 5 % Place 1 patch onto the skin every 12 (twelve) hours. Remove & Discard patch within 12 hours or as directed by MD Patient  not taking: Reported on 01/05/2022 09/15/21 09/15/22  Nance Pear, MD  nitroGLYCERIN (NITROSTAT) 0.4 MG SL tablet Place 0.4 mg under the tongue every 5 (five) minutes as needed for chest pain.    [provider]  pantoprazole (PROTONIX) 40 MG tablet Take 1 tablet (40 mg total) by mouth daily. Patient taking differently: Take 40 mg by mouth 2 (two) times daily. 04/18/18   Theora Gianotti, NP  traMADol (ULTRAM) 50 MG tablet Take 50 mg by mouth every 6 (six) hours as needed. Patient not taking: Reported on 01/05/2022 05/23/21   [provider]  vitamin B-12 (CYANOCOBALAMIN) 1000 MCG tablet Take 1,000 mcg by mouth daily.    [provider]    Family History Family History  Problem Relation Age of Onset   Other Mother        died @ 64.   Cancer  Father        died of pancreatic cancer   CAD Brother        alive in late 47's w/ h/o stenting.   Breast cancer Neg Hx     Social History Social History   Tobacco Use   Smoking status: Some Days    Packs/day: 0.25    Years: 50.00    Total pack years: 12.50    Types: Cigarettes   Smokeless tobacco: Never   Tobacco comments:    Smoking 2 cigarettes every other day.   Vaping Use   Vaping Use: Never used  Substance Use Topics   Alcohol use: No    Alcohol/week: 0.0 standard drinks of alcohol   Drug use: No     Allergies   Patient has no known allergies.   Review of Systems Review of Systems  Constitutional:  Negative for chills and fever.  Skin:  Positive for wound. Negative for color change.  All other systems reviewed and are negative.    Physical Exam Triage Vital Signs ED Triage Vitals [06/18/22 1245]  Enc Vitals Group     BP      Pulse      Resp      Temp      Temp src      SpO2      Weight      Height      Head Circumference      Peak Flow      Pain Score 0     Pain Loc      Pain Edu?      Excl. in [place]?    No data found.  Updated Vital Signs BP (!) 168/91   Pulse 73    Temp 98.5 F (36.9 C)   Resp 20   Ht '5\' 2"'$  (1.575 m)   SpO2 95%   BMI 16.73 kg/m   Visual Acuity Right Eye Distance:   Left Eye Distance:   Bilateral Distance:    Right Eye Near:   Left Eye Near:    Bilateral Near:     Physical Exam Vitals and nursing note reviewed.  Constitutional:      General: She is not in acute distress.    Appearance: She is well-developed.  HENT:     Mouth/Throat:     Mouth: Mucous membranes are moist.  Cardiovascular:     Rate and Rhythm: Normal rate and regular rhythm.  Pulmonary:     Effort: Pulmonary effort is normal. No respiratory distress.  Musculoskeletal:     Cervical back: Neck supple.  Skin:    General: Skin is warm and dry.     Findings: Lesion present.     Comments: Laceration on right forearm appears to be healing well. No erythema or drainage.    Neurological:     Mental Status: She is alert.  Psychiatric:        Mood and Affect: Mood normal.        Behavior: Behavior normal.      UC Treatments / Results  Labs (all labs ordered are listed, but only abnormal results are displayed) Labs Reviewed - No data to display  EKG   Radiology No results found.  Procedures Procedures (including critical care time)  Medications Ordered in UC Medications  rabies vaccine (RABAVERT) injection 1 mL (1 mL Intramuscular Given 06/18/22 1301)    Initial Impression / Assessment and Plan / UC Course  I have reviewed the triage vital signs  and the nursing notes.  Pertinent labs & imaging results that were available during my care of the patient were reviewed by me and considered in my medical decision making (see chart for details).    Sutures removal, laceration of right forearm, rabies injection.   8 sutures removed and rabies series injection given by RN.  Wound appears to be healing well.  Education on sutures removal after care given.  Also discussed with patient that her blood pressure is elevated today and needs to be  rechecked by PCP in 2 to 4 weeks.  Education provided on managing hypertension.  She agrees to plan of care.    Final Clinical Impressions(s) / UC Diagnoses   Final diagnoses:  Encounter for removal of sutures  Laceration of right forearm, subsequent encounter  Encounter for medication administration  Elevated blood pressure reading in office with diagnosis of hypertension     Discharge Instructions      Your blood pressure is elevated today at 168/91.  Please have this rechecked by your primary care provider in 2-4 weeks.  See the attached information on managing your high blood pressure.  Keep your wound clean and dry.  Wash it gently twice a day with soap and water.  Follow up with your primary care provider as needed.  See the attached information on wound care after stitches are removed.            ED Prescriptions   None    PDMP not reviewed this encounter.   Sharion Balloon, NP 06/18/22 1320

## 2022-06-18 NOTE — ED Notes (Signed)
Eight sutures removed from right forearm. Wound well healed. Antibiotic ointment and dressing placed per patient request.  Patient tolerated rabies vaccine into left deltoid well without incident.

## 2022-06-18 NOTE — Discharge Instructions (Addendum)
Your blood pressure is elevated today at 168/91.  Please have this rechecked by your primary care provider in 2-4 weeks.  See the attached information on managing your high blood pressure.  Keep your wound clean and dry.  Wash it gently twice a day with soap and water.  Follow up with your primary care provider as needed.  See the attached information on wound care after stitches are removed.

## 2022-06-18 NOTE — ED Triage Notes (Signed)
Patient to Urgent Care for final rabies vaccine.  Patient also presents for suture removal. Patient has sutures present to right forearm following cat bite 1/7. Unsure of how many sutures were placed on H&P from ER visit does not specify.

## 2022-06-25 ENCOUNTER — Other Ambulatory Visit: Payer: Self-pay | Admitting: Cardiovascular Disease

## 2022-06-25 NOTE — Telephone Encounter (Signed)
Please schedule overdue 4 month f/u with Dr Fletcher Anon, refill pending appt.  Thank you

## 2022-07-11 ENCOUNTER — Telehealth: Payer: Self-pay | Admitting: Cardiovascular Disease

## 2022-07-11 NOTE — Telephone Encounter (Signed)
Patient is under Hospice Care.

## 2022-07-11 NOTE — Telephone Encounter (Signed)
Noted thanks.

## 2022-08-15 DIAGNOSIS — J9611 Chronic respiratory failure with hypoxia: Secondary | ICD-10-CM | POA: Diagnosis not present

## 2022-08-15 DIAGNOSIS — I774 Celiac artery compression syndrome: Secondary | ICD-10-CM | POA: Diagnosis not present

## 2022-08-15 DIAGNOSIS — R634 Abnormal weight loss: Secondary | ICD-10-CM | POA: Diagnosis not present

## 2022-08-15 DIAGNOSIS — I25110 Atherosclerotic heart disease of native coronary artery with unstable angina pectoris: Secondary | ICD-10-CM | POA: Diagnosis not present

## 2022-08-15 DIAGNOSIS — I252 Old myocardial infarction: Secondary | ICD-10-CM | POA: Diagnosis not present

## 2022-08-15 DIAGNOSIS — N838 Other noninflammatory disorders of ovary, fallopian tube and broad ligament: Secondary | ICD-10-CM | POA: Diagnosis not present

## 2022-08-15 DIAGNOSIS — K551 Chronic vascular disorders of intestine: Secondary | ICD-10-CM | POA: Diagnosis not present

## 2022-09-11 IMAGING — CT CT ANGIO ABDOMEN
3 of 9 series · 11 of 46 positions shown, 17 images · IV contrast (omnipaque)
Comparison: Prior CT scan of the abdomen and pelvis 11/11/2019

CLINICAL DATA: Lower and mid upper abdominal pain/soreness for the
past 3 months.

EXAM:
CT ANGIOGRAPHY ABDOMEN
TECHNIQUE: Multidetector CT imaging of the abdomen was performed using the
standard protocol during bolus administration of intravenous
contrast. Multiplanar reconstructed images and MIPs were obtained
and reviewed to evaluate the vascular anatomy.
CONTRAST:  75mL OMNIPAQUE IOHEXOL 350 MG/ML SOLN

[Series 4: axial st cta arterial 2.00 · axial · arterial · 0.58mm/px · z∈[-1377,-1293]mm · 4 of 107 slices shown]
[im 8/107  soft-tissue]
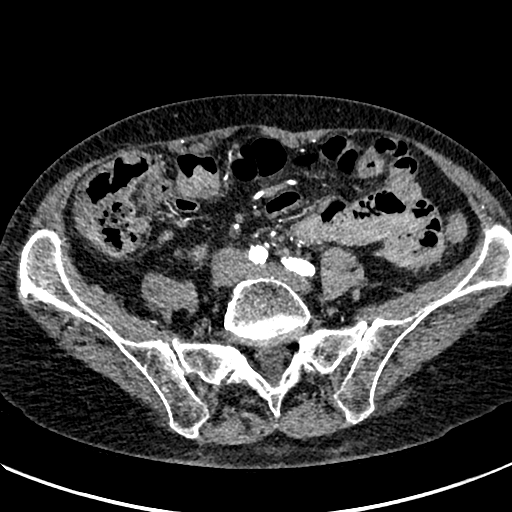
[im 22/107  soft-tissue]
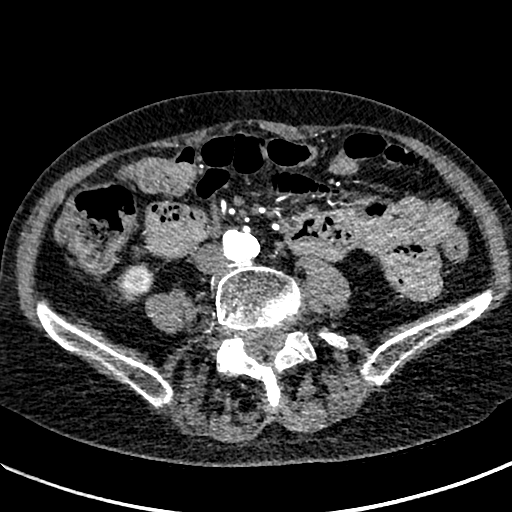
[im 36/107  soft-tissue]
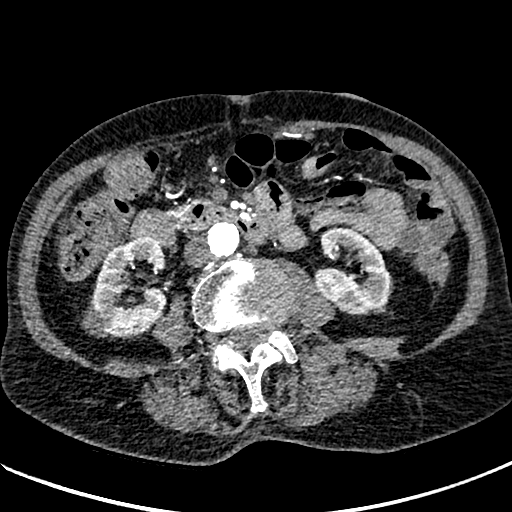
[im 50/107  soft-tissue]
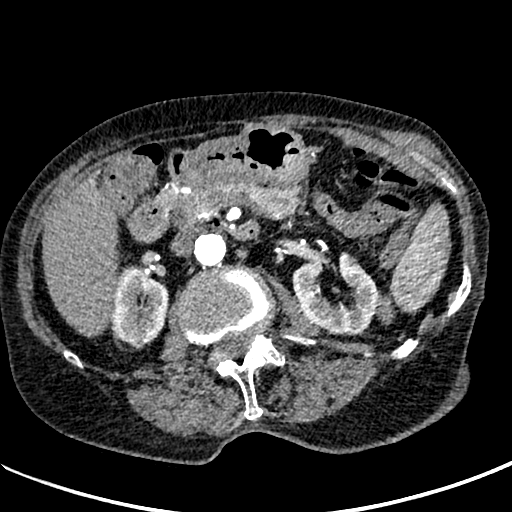

[Series 7: cor art st cta arterial 2.00 cor · coronal · arterial · 0.42mm/px · 2 of 116 slices shown, 3 images]
[im 39/116  soft-tissue]
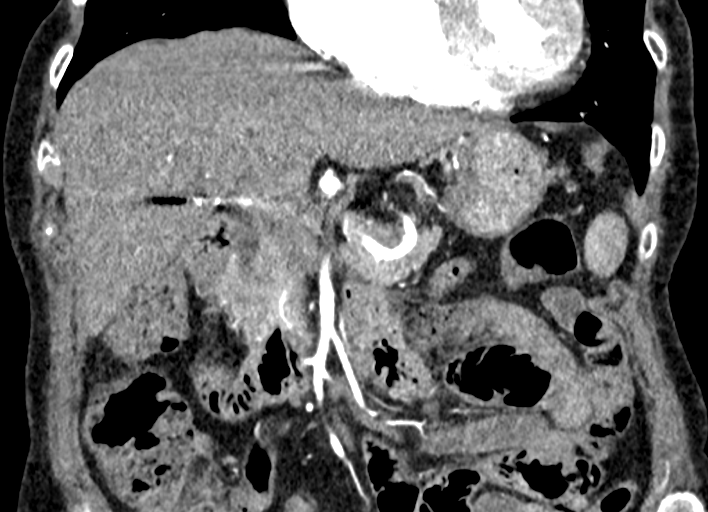
[im 39/116  bone]
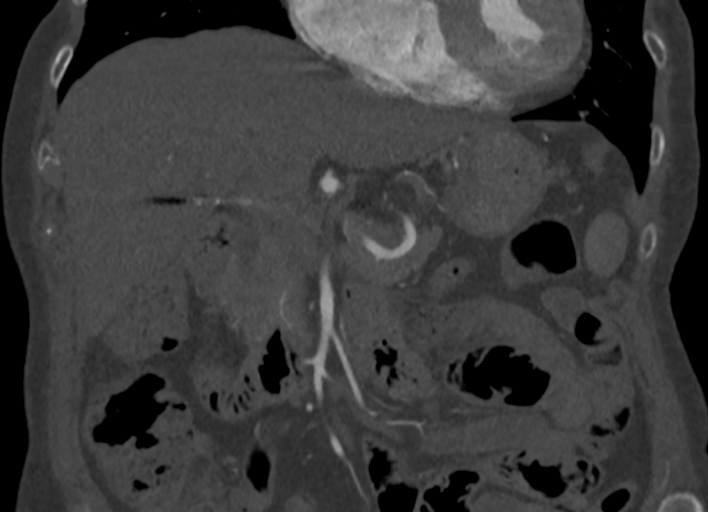
[im 77/116  soft-tissue]
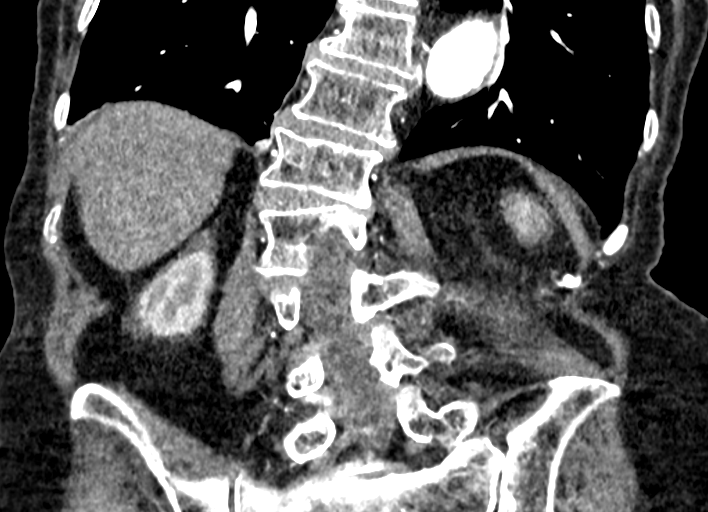

[Series 15: axial st cta venous 5.00 · axial · portal-venous · 0.59mm/px · z∈[-1355,-1215]mm · 5 of 43 slices shown, 10 images]
[im 8/43  soft-tissue]
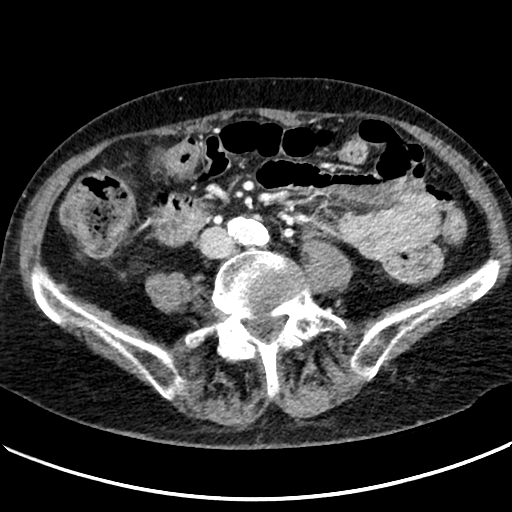
[im 8/43  bone]
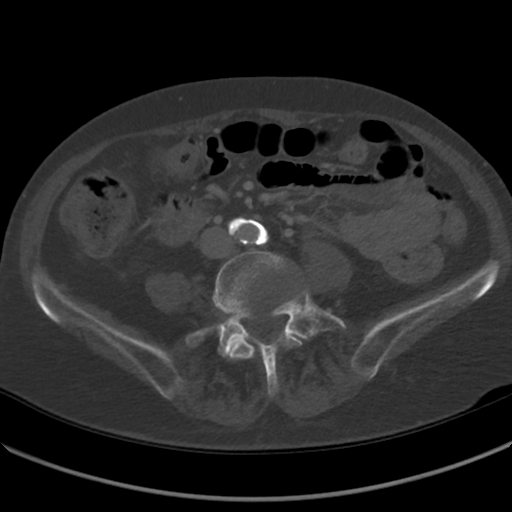
[im 15/43  soft-tissue]
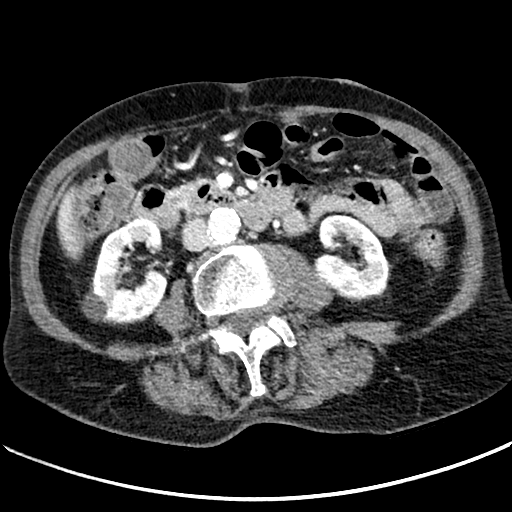
[im 15/43  lung]
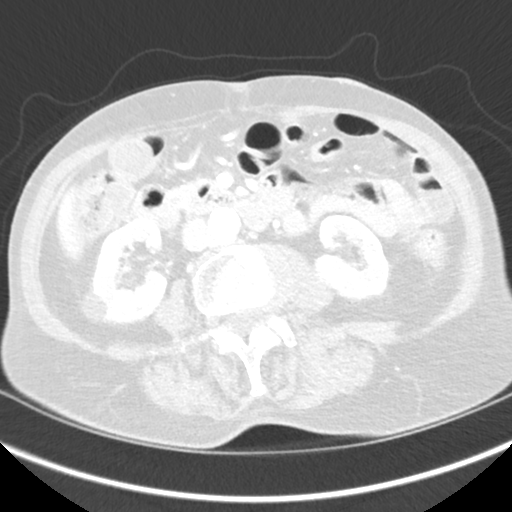
[im 22/43  soft-tissue]
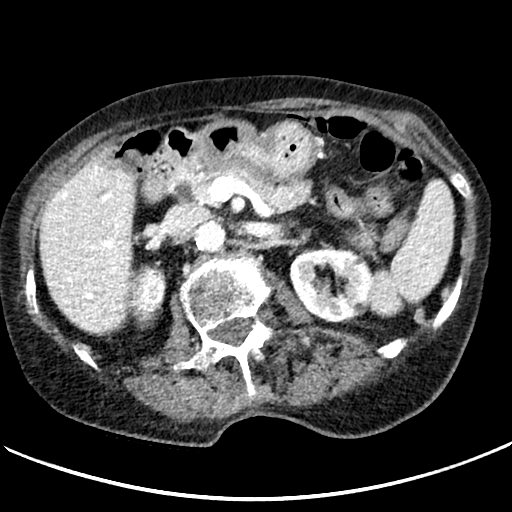
[im 22/43  lung]
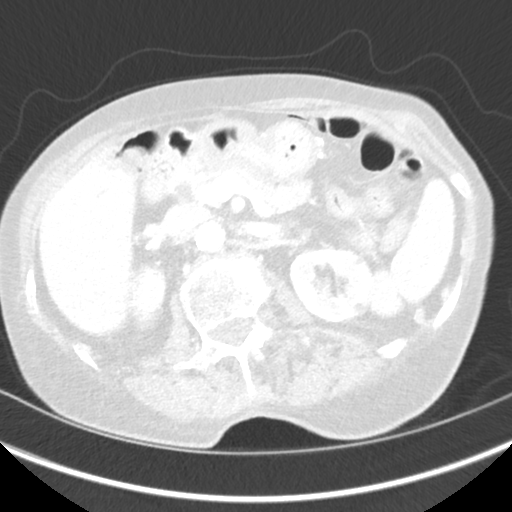
[im 29/43  soft-tissue]
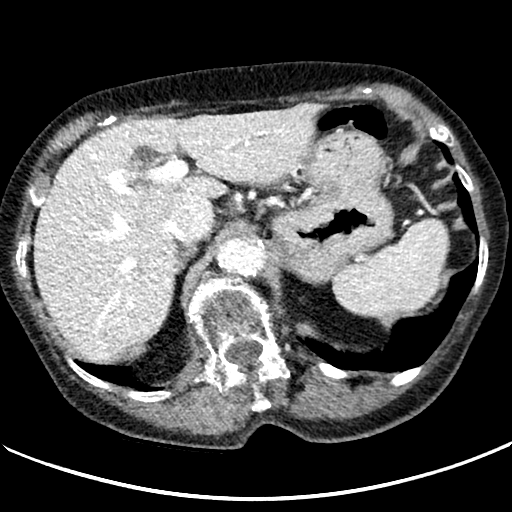
[im 29/43  lung]
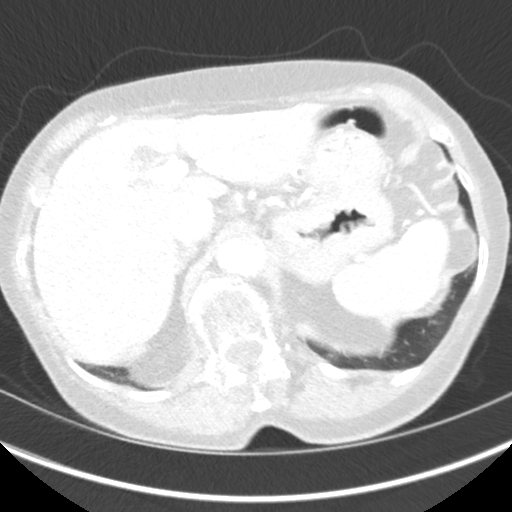
[im 36/43  soft-tissue]
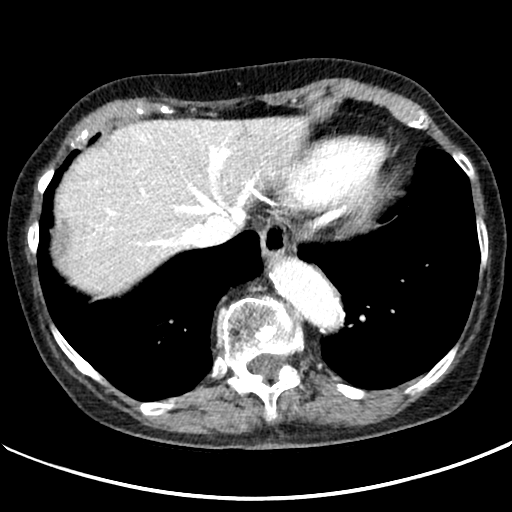
[im 36/43  lung]
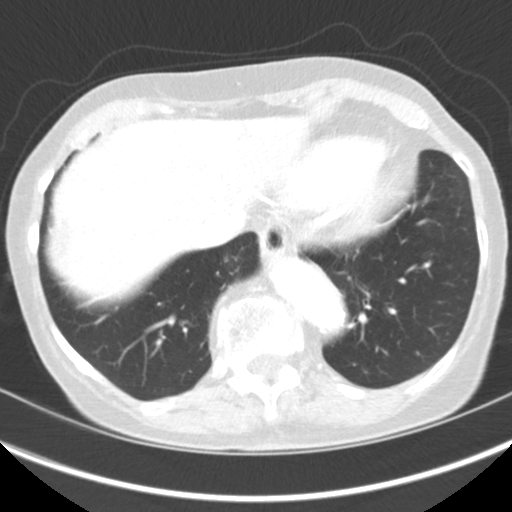

[11 of 46 positions shown; findings below may reference images not displayed]

FINDINGS: VASCULAR

Aorta: Tortuous but normal caliber aorta with scattered calcified
atherosclerotic plaque. No evidence of aneurysm or dissection.
Various areas of mild focal ectasia.

Celiac: Critical stenosis versus short segment occlusion at the
origin of the celiac artery. Mildly hypertrophic pancreaticoduodenal
arcade.

SMA: Patent without evidence of aneurysm, dissection, vasculitis or
significant stenosis.

Renals: Solitary right renal artery. 2 left-sided renal arteries.
Mild atherosclerotic plaque without significant stenosis, aneurysm,
dissection or changes of fibromuscular dysplasia.

IMA: Patent without evidence of aneurysm, dissection, vasculitis or
significant stenosis.

Inflow: The imaged portions are patent with heavy calcified plaque
but no significant stenosis, occlusion, dissection or aneurysm.

Veins: No focal venous abnormality.

Review of the MIP images confirms the above findings.

NON-VASCULAR

Lower chest: Mild cardiomegaly. No acute abnormality in the lower
lungs.

Hepatobiliary: No focal liver abnormality is seen. Status post
cholecystectomy. Stable mild chronic biliary dilatation.

Pancreas: Unremarkable. No pancreatic ductal dilatation or
surrounding inflammatory changes.

Spleen: Normal in size without focal abnormality.

Adrenals/Urinary Tract: The adrenal glands are normal. Small low and
intermediate attenuation cystic lesions present in both kidneys. The
largest exophytic from the interpolar right kidney measures up to
1.8 cm, minimally enlarged compared to prior imaging dating back to
Tuesday July, 2016. No convincing evidence of delayed enhancement.

Stomach/Bowel: No focal bowel wall thickening or evidence of
obstruction.

Lymphatic: No suspicious lymphadenopathy.

Other: Small fat containing umbilical hernia. No abdominopelvic
ascites.

Musculoskeletal: Stable chronic L5 and L1 compression fractures
without significant interval height loss. Dextroconvex scoliosis of
the thoracolumbar spine centered at L1-L2.
IMPRESSION: VASCULAR

1. Critical stenosis versus short segment occlusion of the proximal
celiac axis. The remainder of the vessel is well opacified likely
secondary to collateral flow from the SMA through the
pancreaticoduodenal arcade.
2. Widely patent SMA and IMA.
3.  Aortic Atherosclerosis (F876N-170.0).

NON-VASCULAR

1. No acute abnormality within the abdomen.
2. Ancillary findings as above without significant interval change.

## 2022-10-17 DIAGNOSIS — I25110 Atherosclerotic heart disease of native coronary artery with unstable angina pectoris: Secondary | ICD-10-CM | POA: Diagnosis not present

## 2022-10-17 DIAGNOSIS — N189 Chronic kidney disease, unspecified: Secondary | ICD-10-CM | POA: Diagnosis not present

## 2022-10-17 DIAGNOSIS — I130 Hypertensive heart and chronic kidney disease with heart failure and stage 1 through stage 4 chronic kidney disease, or unspecified chronic kidney disease: Secondary | ICD-10-CM | POA: Diagnosis not present

## 2022-10-17 DIAGNOSIS — K551 Chronic vascular disorders of intestine: Secondary | ICD-10-CM | POA: Diagnosis not present

## 2022-10-17 DIAGNOSIS — I509 Heart failure, unspecified: Secondary | ICD-10-CM | POA: Diagnosis not present

## 2022-10-17 DIAGNOSIS — I774 Celiac artery compression syndrome: Secondary | ICD-10-CM | POA: Diagnosis not present

## 2022-10-17 DIAGNOSIS — I252 Old myocardial infarction: Secondary | ICD-10-CM | POA: Diagnosis not present

## 2022-12-18 DIAGNOSIS — I774 Celiac artery compression syndrome: Secondary | ICD-10-CM | POA: Diagnosis not present

## 2022-12-18 DIAGNOSIS — I25110 Atherosclerotic heart disease of native coronary artery with unstable angina pectoris: Secondary | ICD-10-CM | POA: Diagnosis not present

## 2022-12-18 DIAGNOSIS — N189 Chronic kidney disease, unspecified: Secondary | ICD-10-CM | POA: Diagnosis not present

## 2022-12-18 DIAGNOSIS — K551 Chronic vascular disorders of intestine: Secondary | ICD-10-CM | POA: Diagnosis not present

## 2022-12-18 DIAGNOSIS — I509 Heart failure, unspecified: Secondary | ICD-10-CM | POA: Diagnosis not present

## 2022-12-18 DIAGNOSIS — I252 Old myocardial infarction: Secondary | ICD-10-CM | POA: Diagnosis not present

## 2022-12-18 DIAGNOSIS — I130 Hypertensive heart and chronic kidney disease with heart failure and stage 1 through stage 4 chronic kidney disease, or unspecified chronic kidney disease: Secondary | ICD-10-CM | POA: Diagnosis not present

## 2023-06-23 ENCOUNTER — Encounter: Payer: Self-pay | Admitting: Emergency Medicine

## 2023-06-23 ENCOUNTER — Emergency Department

## 2023-06-23 ENCOUNTER — Emergency Department
Admission: EM | Admit: 2023-06-23 | Discharge: 2023-06-23 | Disposition: A | Discharge status: Home / Self Care | Attending: Emergency Medicine | Admitting: Emergency Medicine

## 2023-06-23 ENCOUNTER — Other Ambulatory Visit: Payer: Self-pay

## 2023-06-23 DIAGNOSIS — S0181XA Laceration without foreign body of other part of head, initial encounter: Secondary | ICD-10-CM | POA: Insufficient documentation

## 2023-06-23 DIAGNOSIS — I700 Atherosclerosis of aorta: Secondary | ICD-10-CM | POA: Diagnosis not present

## 2023-06-23 DIAGNOSIS — S0990XA Unspecified injury of head, initial encounter: Secondary | ICD-10-CM | POA: Diagnosis not present

## 2023-06-23 DIAGNOSIS — W01198A Fall on same level from slipping, tripping and stumbling with subsequent striking against other object, initial encounter: Secondary | ICD-10-CM | POA: Insufficient documentation

## 2023-06-23 DIAGNOSIS — M4856XA Collapsed vertebra, not elsewhere classified, lumbar region, initial encounter for fracture: Secondary | ICD-10-CM | POA: Diagnosis not present

## 2023-06-23 DIAGNOSIS — M4854XA Collapsed vertebra, not elsewhere classified, thoracic region, initial encounter for fracture: Secondary | ICD-10-CM | POA: Diagnosis not present

## 2023-06-23 DIAGNOSIS — W19XXXA Unspecified fall, initial encounter: Secondary | ICD-10-CM | POA: Diagnosis not present

## 2023-06-23 DIAGNOSIS — Y92009 Unspecified place in unspecified non-institutional (private) residence as the place of occurrence of the external cause: Secondary | ICD-10-CM | POA: Diagnosis not present

## 2023-06-23 DIAGNOSIS — Z043 Encounter for examination and observation following other accident: Secondary | ICD-10-CM | POA: Diagnosis not present

## 2023-06-23 NOTE — ED Provider Notes (Signed)
[facility] Provider Note    Event Date/Time   First MD Initiated Contact with Patient 06/23/23 1058     (approximate)   History   Fall   HPI  Cheryl Hall is a 83 year old female presenting to the emergency department for evaluation after a fall.  Patient turned around in her home, lost her balance and fell and hit her head on her coffee table.  No LOC.  No preceding chest pain, shortness of breath, lightheadedness.  Not on anticoagulation.  Patient reports chronic back pain, not acutely worse, denies pain in other areas.  Tetanus UTD.     Physical Exam   Triage Vital Signs: ED Triage Vitals  Encounter Vitals Group     BP 06/23/23 1028 97/78     Systolic BP Percentile --      Diastolic BP Percentile --      Pulse Rate 06/23/23 1028 94     Resp 06/23/23 1028 18     Temp 06/23/23 1028 97.8 F (36.6 C)     Temp Source 06/23/23 1028 Oral     SpO2 06/23/23 1028 97 %     Weight --      Height --      Head Circumference --      Peak Flow --      Pain Score 06/23/23 1024 6     Pain Loc --      Pain Education --      Exclude from Growth Chart --     Most recent vital signs: Vitals:   06/23/23 1028  BP: 97/78  Pulse: 94  Resp: 18  Temp: 97.8 F (36.6 C)  SpO2: 97%     Nursing notes and vital signs reviewed.  General: Adult female, laying in bed, awake and interactive Head: Dried blood over the face, 1 cm superficial laceration over the right forehead without active bleeding, no other visible trauma to the head Back: No palpable deformities, no point tenderness, freely ranging neck Chest: Symmetric chest rise, no tenderness to palpation.  Cardiac: Regular rhythm and rate.  Respiratory: Lungs clear to auscultation Abdomen: Soft, nondistended. No tenderness to palpation.  MSK: No deformity to bilateral upper and lower extremity. Full range of motion to bilateral upper lower extremity with no pain. Neuro: Alert, oriented. GCS 15. 5 out of 5  strength in bilateral upper and lower extremities. Normal sensation to light touch in bilateral upper and lower extremity. Skin: No evidence of burns or lacerations.   ED Results / Procedures / Treatments   Labs (all labs ordered are listed, but only abnormal results are displayed) Labs Reviewed - No data to display   EKG EKG independently reviewed interpreted by myself (ER attending) demonstrates:    RADIOLOGY Imaging independently reviewed and interpreted by myself demonstrates:  CT head without acute bleed CT C-spine without acute fracture.  Multiple chronic thoracic compression fractures noted, age-indeterminate T2 compression fracture noted, patient without focal tenderness in this area.  PROCEDURES:  Critical Care performed: No  .Laceration Repair  Date/Time: 06/23/2023 11:35 AM  Performed by: Trinna Post, MD Authorized by: Trinna Post, MD   Consent:    Consent obtained:  Verbal   Consent given by:  Patient   Risks, benefits, and alternatives were discussed: yes   Anesthesia:    Anesthesia method:  None Laceration details:    Location:  Face   Face location:  Forehead   Length (cm):  1 Treatment:    Area  cleansed with:  Saline Skin repair:    Repair method:  Tissue adhesive Repair type:    Repair type:  Simple Post-procedure details:    Procedure completion:  Tolerated    MEDICATIONS ORDERED IN ED: Medications - No data to display   IMPRESSION / MDM / ASSESSMENT AND PLAN / ED COURSE  I reviewed the triage vital signs and the nursing notes.  Differential diagnosis includes, but is not limited to, intracranial bleed, skull fracture, spine fracture, no evidence of thoracoabdominal trauma  Patient's presentation is most consistent with acute presentation with potential threat to life or bodily function.  83 year old female presenting after fall with head trauma.  Reassuring exam here.  Small laceration noted over right forehead without significant gaping,  closed with skin glue as above.  Patient eager to be discharged home.  Do think this is reasonable with her reassuring workup.  Strict return precautions provided.  Patient discharged stable condition.     FINAL CLINICAL IMPRESSION(S) / ED DIAGNOSES   Final diagnoses:  Facial laceration, initial encounter  Closed head injury, initial encounter     Rx / DC Orders   ED Discharge Orders     None        Note:  This document was prepared using Dragon voice recognition software and may include unintentional dictation errors.   Trinna Post, MD 06/23/23 [phone]

## 2023-06-23 NOTE — ED Triage Notes (Signed)
First Nurse Note:  Pt via [facility] from home. Pt c/o mechanical fall. Pt did hit the front of head on the coffee table. Denies LOC. Laceration to R forehead. Denies blood thinners. Pt is A&OX4 and NAD  144/88 BP  98% on 2L [place] Chronically.  77 HR

## 2023-06-23 NOTE — Discharge Instructions (Signed)
You were seen in the emergency department today after a head injury. Fortunately, your exam and CT scan were overall reassuring. It is still possible that you have a concussion. I have included more information about this in your paperwork.  Your laceration was repaired with skin glue.  You can shower, but avoid submerging the area such as bathing or swimming.  Avoid picking at the area, the glue should fall off on its own in about a week.  Return to the ER for any worsening symptoms including worsening headache, confusion, or any other new or concerning symptoms.

## 2023-07-25 ENCOUNTER — Other Ambulatory Visit: Payer: Self-pay

## 2023-07-25 ENCOUNTER — Inpatient Hospital Stay
Admission: EM | Admit: 2023-07-25 | Discharge: 2023-07-31 | DRG: 521 | Disposition: A | Discharge status: Skilled Nursing Facility | Payer: Medicare HMO | Attending: Internal Medicine | Admitting: Internal Medicine

## 2023-07-25 ENCOUNTER — Emergency Department: Payer: Medicare HMO

## 2023-07-25 DIAGNOSIS — D509 Iron deficiency anemia, unspecified: Secondary | ICD-10-CM | POA: Diagnosis present

## 2023-07-25 DIAGNOSIS — N1831 Chronic kidney disease, stage 3a: Secondary | ICD-10-CM

## 2023-07-25 DIAGNOSIS — H9190 Unspecified hearing loss, unspecified ear: Secondary | ICD-10-CM | POA: Diagnosis present

## 2023-07-25 DIAGNOSIS — K219 Gastro-esophageal reflux disease without esophagitis: Secondary | ICD-10-CM | POA: Diagnosis present

## 2023-07-25 DIAGNOSIS — I252 Old myocardial infarction: Secondary | ICD-10-CM

## 2023-07-25 DIAGNOSIS — Z681 Body mass index (BMI) 19 or less, adult: Secondary | ICD-10-CM | POA: Diagnosis not present

## 2023-07-25 DIAGNOSIS — Z7982 Long term (current) use of aspirin: Secondary | ICD-10-CM

## 2023-07-25 DIAGNOSIS — Z7401 Bed confinement status: Secondary | ICD-10-CM | POA: Diagnosis not present

## 2023-07-25 DIAGNOSIS — Z96642 Presence of left artificial hip joint: Secondary | ICD-10-CM | POA: Diagnosis not present

## 2023-07-25 DIAGNOSIS — I517 Cardiomegaly: Secondary | ICD-10-CM | POA: Diagnosis not present

## 2023-07-25 DIAGNOSIS — D631 Anemia in chronic kidney disease: Secondary | ICD-10-CM | POA: Diagnosis present

## 2023-07-25 DIAGNOSIS — J9601 Acute respiratory failure with hypoxia: Secondary | ICD-10-CM

## 2023-07-25 DIAGNOSIS — S72002A Fracture of unspecified part of neck of left femur, initial encounter for closed fracture: Principal | ICD-10-CM

## 2023-07-25 DIAGNOSIS — I700 Atherosclerosis of aorta: Secondary | ICD-10-CM | POA: Diagnosis not present

## 2023-07-25 DIAGNOSIS — Q782 Osteopetrosis: Secondary | ICD-10-CM | POA: Diagnosis not present

## 2023-07-25 DIAGNOSIS — I129 Hypertensive chronic kidney disease with stage 1 through stage 4 chronic kidney disease, or unspecified chronic kidney disease: Secondary | ICD-10-CM | POA: Diagnosis present

## 2023-07-25 DIAGNOSIS — Z8249 Family history of ischemic heart disease and other diseases of the circulatory system: Secondary | ICD-10-CM

## 2023-07-25 DIAGNOSIS — Z515 Encounter for palliative care: Secondary | ICD-10-CM

## 2023-07-25 DIAGNOSIS — I2510 Atherosclerotic heart disease of native coronary artery without angina pectoris: Secondary | ICD-10-CM | POA: Diagnosis present

## 2023-07-25 DIAGNOSIS — M549 Dorsalgia, unspecified: Secondary | ICD-10-CM | POA: Diagnosis present

## 2023-07-25 DIAGNOSIS — R54 Age-related physical debility: Secondary | ICD-10-CM | POA: Diagnosis present

## 2023-07-25 DIAGNOSIS — Z751 Person awaiting admission to adequate facility elsewhere: Secondary | ICD-10-CM

## 2023-07-25 DIAGNOSIS — W19XXXA Unspecified fall, initial encounter: Secondary | ICD-10-CM | POA: Diagnosis present

## 2023-07-25 DIAGNOSIS — G8929 Other chronic pain: Secondary | ICD-10-CM | POA: Diagnosis present

## 2023-07-25 DIAGNOSIS — S72009A Fracture of unspecified part of neck of unspecified femur, initial encounter for closed fracture: Secondary | ICD-10-CM | POA: Diagnosis present

## 2023-07-25 DIAGNOSIS — M8580 Other specified disorders of bone density and structure, unspecified site: Secondary | ICD-10-CM | POA: Diagnosis not present

## 2023-07-25 DIAGNOSIS — M25552 Pain in left hip: Secondary | ICD-10-CM | POA: Diagnosis not present

## 2023-07-25 DIAGNOSIS — I10 Essential (primary) hypertension: Secondary | ICD-10-CM | POA: Diagnosis not present

## 2023-07-25 DIAGNOSIS — F17210 Nicotine dependence, cigarettes, uncomplicated: Secondary | ICD-10-CM | POA: Diagnosis present

## 2023-07-25 DIAGNOSIS — R339 Retention of urine, unspecified: Secondary | ICD-10-CM | POA: Diagnosis present

## 2023-07-25 DIAGNOSIS — R338 Other retention of urine: Secondary | ICD-10-CM

## 2023-07-25 DIAGNOSIS — E43 Unspecified severe protein-calorie malnutrition: Secondary | ICD-10-CM | POA: Diagnosis present

## 2023-07-25 DIAGNOSIS — Z79899 Other long term (current) drug therapy: Secondary | ICD-10-CM

## 2023-07-25 DIAGNOSIS — E785 Hyperlipidemia, unspecified: Secondary | ICD-10-CM | POA: Diagnosis present

## 2023-07-25 DIAGNOSIS — S32592A Other specified fracture of left pubis, initial encounter for closed fracture: Secondary | ICD-10-CM | POA: Diagnosis not present

## 2023-07-25 DIAGNOSIS — G629 Polyneuropathy, unspecified: Secondary | ICD-10-CM | POA: Diagnosis present

## 2023-07-25 DIAGNOSIS — Z800 Family history of malignant neoplasm of digestive organs: Secondary | ICD-10-CM

## 2023-07-25 DIAGNOSIS — M545 Low back pain, unspecified: Secondary | ICD-10-CM | POA: Diagnosis not present

## 2023-07-25 DIAGNOSIS — R0902 Hypoxemia: Secondary | ICD-10-CM | POA: Diagnosis not present

## 2023-07-25 DIAGNOSIS — S79929A Unspecified injury of unspecified thigh, initial encounter: Secondary | ICD-10-CM | POA: Diagnosis not present

## 2023-07-25 MED ORDER — SODIUM CHLORIDE 0.9 % IV SOLN: 500.0000 mg | Freq: Once | INTRAVENOUS | Status: DC

## 2023-07-25 MED ORDER — FENTANYL CITRATE PF 50 MCG/ML IJ SOSY
100.0000 ug | PREFILLED_SYRINGE | Freq: Once | INTRAMUSCULAR | Status: AC
  Administered 2023-07-25: 100 ug via INTRAVENOUS
  Filled 2023-07-25: qty 2

## 2023-07-25 MED ORDER — MORPHINE SULFATE (PF) 2 MG/ML IV SOLN
2.0000 mg | Freq: Once | INTRAVENOUS | Status: AC | PRN
  Administered 2023-07-26: 2 mg via INTRAVENOUS
  Filled 2023-07-25: qty 1

## 2023-07-25 MED ORDER — SODIUM CHLORIDE 0.9 % IV SOLN: 1.0000 g | Freq: Once | INTRAVENOUS | Status: DC

## 2023-07-25 MED ORDER — MORPHINE SULFATE (PF) 2 MG/ML IV SOLN
2.0000 mg | Freq: Once | INTRAVENOUS | Status: AC
  Administered 2023-07-26: 2 mg via INTRAVENOUS
  Filled 2023-07-25: qty 1

## 2023-07-25 NOTE — Progress Notes (Signed)
 Imaging and case reviewed Patient with displaced left femoral neck fracture Patient will need surgical intervention for this fracture likely with hemiarthroplasty Tentative plan for surgery tomorrow 07/26/2023  NPO after midnight  Hold chemical AC after midnight for OR Will review hip fracture and surgical treatment options with patient tomorrow and will fill out consent tomorrow. Admit to medical team Labs pending for Preop CBC/CMP Full consult dictation to follow  Reinaldo Berber

## 2023-07-25 NOTE — ED Provider Notes (Signed)
 [facility] Provider Note    Event Date/Time   First MD Initiated Contact with Patient 07/25/23 2202     (approximate)  History   Chief Complaint: Fall  HPI  AYSLIN KUNDERT is a 83 y.o. female with a past medical history of anemia, chronic back pain, CKD, hypertension, hyperlipidemia, osteoporosis, presents to the emergency department after a fall with left hip pain.  According to the patient she had a mechanical fall falling onto her left side with significant left hip pain.  Did not hit her head.  No blood thinner.  No LOC.  Patient has significant pain in left hip unable to stand or bear weight due to pain.  Physical Exam   Triage Vital Signs: ED Triage Vitals  Encounter Vitals Group     BP 07/25/23 2203 (!) 170/95     Systolic BP Percentile --      Diastolic BP Percentile --      Pulse Rate 07/25/23 2203 83     Resp 07/25/23 2203 20     Temp 07/25/23 2203 (!) 97.5 F (36.4 C)     Temp Source 07/25/23 2203 Oral     SpO2 07/25/23 2203 99 %     Weight 07/25/23 2206 98 lb 11.2 oz (44.8 kg)     Height 07/25/23 2206 5\' 3"  (1.6 m)     Head Circumference --      Peak Flow --      Pain Score 07/25/23 2204 0     Pain Loc --      Pain Education --      Exclude from Growth Chart --     Most recent vital signs: Vitals:   07/25/23 2203  BP: (!) 170/95  Pulse: 83  Resp: 20  Temp: (!) 97.5 F (36.4 C)  SpO2: 99%    General: Awake, no distress.  CV:  Good peripheral perfusion.  Regular rate and rhythm  Resp:  Normal effort.  Equal breath sounds bilaterally.  Abd:  No distention.  Soft, nontender.  No rebound or guarding. Other:  Moderate tenderness to palpation of the left hip.  Neurovascularly intact with 1-2+ DP pulse.  Sensation intact extremities warm.  Shortened and externally rotated left lower extremity.   ED Results / Procedures / Treatments     RADIOLOGY  I have reviewed and did the hip x-ray images.  Patient appears to have a left  femoral neck fracture.   MEDICATIONS ORDERED IN ED: Medications - No data to display   IMPRESSION / MDM / ASSESSMENT AND PLAN / ED COURSE  I reviewed the triage vital signs and the nursing notes.  Patient's presentation is most consistent with acute presentation with potential threat to life or bodily function.  Patient presents emergency department for left hip pain after a fall.  Patient has a shortened externally rotated left lower extremity with moderate tenderness to palpation of the left hip.  Highly suspicious for left hip fracture.  Will check labs obtain a preop chest x-ray obtain hip x-rays and continue to closely monitor.  Will treat pain as needed while awaiting results.  Patient appears to have a left femoral neck fracture.  I spoke to Dr. Audelia Acton of orthopedics who will plan to operate tomorrow.  We will make the patient n.p.o. at midnight.  Remainder of the lab work and workup is pending.  Patient will require admission to the hospital service once her ED workup is completed.  FINAL CLINICAL IMPRESSION(S) /  ED DIAGNOSES   Left hip fracture   Note:  This document was prepared using Dragon voice recognition software and may include unintentional dictation errors.   Minna Antis, MD 07/25/23 2314
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
## 2023-07-26 ENCOUNTER — Encounter: Payer: Self-pay | Admitting: Internal Medicine

## 2023-07-26 ENCOUNTER — Other Ambulatory Visit: Payer: Self-pay

## 2023-07-26 ENCOUNTER — Inpatient Hospital Stay: Payer: Medicare HMO | Admitting: Certified Registered"

## 2023-07-26 ENCOUNTER — Encounter
Admission: EM | Disposition: A | Discharge status: Skilled Nursing Facility | Payer: Self-pay | Source: Home / Self Care | Attending: Internal Medicine

## 2023-07-26 ENCOUNTER — Inpatient Hospital Stay: Payer: Medicare HMO

## 2023-07-26 DIAGNOSIS — D509 Iron deficiency anemia, unspecified: Secondary | ICD-10-CM | POA: Diagnosis present

## 2023-07-26 DIAGNOSIS — Z7982 Long term (current) use of aspirin: Secondary | ICD-10-CM | POA: Diagnosis not present

## 2023-07-26 DIAGNOSIS — I2510 Atherosclerotic heart disease of native coronary artery without angina pectoris: Secondary | ICD-10-CM

## 2023-07-26 DIAGNOSIS — H9190 Unspecified hearing loss, unspecified ear: Secondary | ICD-10-CM | POA: Diagnosis present

## 2023-07-26 DIAGNOSIS — S79929A Unspecified injury of unspecified thigh, initial encounter: Secondary | ICD-10-CM | POA: Diagnosis not present

## 2023-07-26 DIAGNOSIS — S72002A Fracture of unspecified part of neck of left femur, initial encounter for closed fracture: Secondary | ICD-10-CM | POA: Diagnosis present

## 2023-07-26 DIAGNOSIS — R339 Retention of urine, unspecified: Secondary | ICD-10-CM | POA: Diagnosis present

## 2023-07-26 DIAGNOSIS — G8929 Other chronic pain: Secondary | ICD-10-CM

## 2023-07-26 DIAGNOSIS — E43 Unspecified severe protein-calorie malnutrition: Secondary | ICD-10-CM | POA: Insufficient documentation

## 2023-07-26 DIAGNOSIS — Q782 Osteopetrosis: Secondary | ICD-10-CM | POA: Diagnosis not present

## 2023-07-26 DIAGNOSIS — N1831 Chronic kidney disease, stage 3a: Secondary | ICD-10-CM | POA: Diagnosis present

## 2023-07-26 DIAGNOSIS — I252 Old myocardial infarction: Secondary | ICD-10-CM | POA: Diagnosis not present

## 2023-07-26 DIAGNOSIS — J9601 Acute respiratory failure with hypoxia: Secondary | ICD-10-CM

## 2023-07-26 DIAGNOSIS — Z515 Encounter for palliative care: Secondary | ICD-10-CM | POA: Diagnosis not present

## 2023-07-26 DIAGNOSIS — F17210 Nicotine dependence, cigarettes, uncomplicated: Secondary | ICD-10-CM | POA: Diagnosis present

## 2023-07-26 DIAGNOSIS — E785 Hyperlipidemia, unspecified: Secondary | ICD-10-CM | POA: Diagnosis present

## 2023-07-26 DIAGNOSIS — W19XXXA Unspecified fall, initial encounter: Secondary | ICD-10-CM | POA: Diagnosis present

## 2023-07-26 DIAGNOSIS — R338 Other retention of urine: Secondary | ICD-10-CM | POA: Diagnosis not present

## 2023-07-26 DIAGNOSIS — S72009A Fracture of unspecified part of neck of unspecified femur, initial encounter for closed fracture: Secondary | ICD-10-CM | POA: Diagnosis present

## 2023-07-26 DIAGNOSIS — Z7401 Bed confinement status: Secondary | ICD-10-CM | POA: Diagnosis not present

## 2023-07-26 DIAGNOSIS — G629 Polyneuropathy, unspecified: Secondary | ICD-10-CM | POA: Diagnosis present

## 2023-07-26 DIAGNOSIS — Z96642 Presence of left artificial hip joint: Secondary | ICD-10-CM | POA: Diagnosis not present

## 2023-07-26 DIAGNOSIS — K219 Gastro-esophageal reflux disease without esophagitis: Secondary | ICD-10-CM | POA: Diagnosis present

## 2023-07-26 DIAGNOSIS — R54 Age-related physical debility: Secondary | ICD-10-CM | POA: Diagnosis present

## 2023-07-26 DIAGNOSIS — I129 Hypertensive chronic kidney disease with stage 1 through stage 4 chronic kidney disease, or unspecified chronic kidney disease: Secondary | ICD-10-CM | POA: Diagnosis present

## 2023-07-26 DIAGNOSIS — Z681 Body mass index (BMI) 19 or less, adult: Secondary | ICD-10-CM | POA: Diagnosis not present

## 2023-07-26 DIAGNOSIS — Z751 Person awaiting admission to adequate facility elsewhere: Secondary | ICD-10-CM | POA: Diagnosis not present

## 2023-07-26 DIAGNOSIS — M549 Dorsalgia, unspecified: Secondary | ICD-10-CM | POA: Diagnosis present

## 2023-07-26 DIAGNOSIS — Z8249 Family history of ischemic heart disease and other diseases of the circulatory system: Secondary | ICD-10-CM | POA: Diagnosis not present

## 2023-07-26 DIAGNOSIS — M5489 Other dorsalgia: Secondary | ICD-10-CM

## 2023-07-26 DIAGNOSIS — M545 Low back pain, unspecified: Secondary | ICD-10-CM | POA: Diagnosis not present

## 2023-07-26 DIAGNOSIS — D631 Anemia in chronic kidney disease: Secondary | ICD-10-CM | POA: Diagnosis present

## 2023-07-26 DIAGNOSIS — I10 Essential (primary) hypertension: Secondary | ICD-10-CM | POA: Diagnosis not present

## 2023-07-26 HISTORY — PX: HIP ARTHROPLASTY: SHX981

## 2023-07-26 LAB — COMPREHENSIVE METABOLIC PANEL
ALT: 15 U/L (ref 0–44)
AST: 25 U/L (ref 15–41)
Albumin: 3.7 g/dL (ref 3.5–5.0)
Alkaline Phosphatase: 82 U/L (ref 38–126)
Anion gap: 9 (ref 5–15)
BUN: 16 mg/dL (ref 8–23)
CO2: 27 mmol/L (ref 22–32)
Calcium: 9 mg/dL (ref 8.9–10.3)
Chloride: 103 mmol/L (ref 98–111)
Creatinine, Ser: 1.04 mg/dL — ABNORMAL HIGH (ref 0.44–1.00)
GFR, Estimated: 54 mL/min — ABNORMAL LOW (ref >60)
Glucose, Bld: 96 mg/dL (ref 70–99)
Potassium: 4.2 mmol/L (ref 3.5–5.1)
Sodium: 139 mmol/L (ref 135–145)
Total Bilirubin: 0.6 mg/dL (ref 0.0–1.2)
Total Protein: 6.5 g/dL (ref 6.5–8.1)

## 2023-07-26 LAB — CBC
HCT: 34.1 % — ABNORMAL LOW (ref 36.0–46.0)
Hemoglobin: 10.5 g/dL — ABNORMAL LOW (ref 12.0–15.0)
MCH: 30.5 pg (ref 26.0–34.0)
MCHC: 30.8 g/dL (ref 30.0–36.0)
MCV: 99.1 fL (ref 80.0–100.0)
Platelets: 151 10*3/uL (ref 150–400)
RBC: 3.44 MIL/uL — ABNORMAL LOW (ref 3.87–5.11)
RDW: 15.3 % (ref 11.5–15.5)
WBC: 5.5 10*3/uL (ref 4.0–10.5)
nRBC: 0 % (ref 0.0–0.2)

## 2023-07-26 SURGERY — HEMIARTHROPLASTY, HIP, DIRECT ANTERIOR APPROACH, FOR FRACTURE
Anesthesia: General | Site: Hip | Laterality: Left

## 2023-07-26 SURGERY — ARTHROPLASTY BIPOLAR HIP (HEMIARTHROPLASTY)
Anesthesia: Choice | Site: Hip | Laterality: Left

## 2023-07-26 MED ORDER — CEFAZOLIN SODIUM-DEXTROSE 2-4 GM/100ML-% IV SOLN
INTRAVENOUS | Status: AC
  Filled 2023-07-26: qty 100

## 2023-07-26 MED ORDER — FENTANYL CITRATE (PF) 100 MCG/2ML IJ SOLN
INTRAMUSCULAR | Status: AC
Start: 2023-07-26 — End: ?
  Filled 2023-07-26: qty 2

## 2023-07-26 MED ORDER — PHENOL 1.4 % MT LIQD: 1.0000 | OROMUCOSAL | Status: DC | PRN

## 2023-07-26 MED ORDER — GLYCOPYRROLATE 0.2 MG/ML IJ SOLN
INTRAMUSCULAR | Status: DC | PRN
  Administered 2023-07-26: .2 mg via INTRAVENOUS

## 2023-07-26 MED ORDER — ENOXAPARIN SODIUM 40 MG/0.4ML IJ SOSY
40.0000 mg | PREFILLED_SYRINGE | INTRAMUSCULAR | Status: DC
  Administered 2023-07-27: 40 mg via SUBCUTANEOUS
  Filled 2023-07-26: qty 0.4

## 2023-07-26 MED ORDER — KETAMINE HCL 50 MG/5ML IJ SOSY
PREFILLED_SYRINGE | INTRAMUSCULAR | Status: AC
  Filled 2023-07-26: qty 5

## 2023-07-26 MED ORDER — FENTANYL CITRATE (PF) 100 MCG/2ML IJ SOLN
INTRAMUSCULAR | Status: DC | PRN
Start: 2023-07-26 — End: 2023-07-26
  Administered 2023-07-26: 75 ug via INTRAVENOUS
  Administered 2023-07-26: 25 ug via INTRAVENOUS

## 2023-07-26 MED ORDER — PROPOFOL 10 MG/ML IV BOLUS
INTRAVENOUS | Status: DC | PRN
  Administered 2023-07-26: 150 ug/kg/min via INTRAVENOUS
  Administered 2023-07-26: 100 ug/kg/min via INTRAVENOUS

## 2023-07-26 MED ORDER — FENTANYL CITRATE (PF) 100 MCG/2ML IJ SOLN
25.0000 ug | INTRAMUSCULAR | Status: DC | PRN
Start: 2023-07-26 — End: 2023-07-26

## 2023-07-26 MED ORDER — ADULT MULTIVITAMIN W/MINERALS CH
1.0000 | ORAL_TABLET | Freq: Every day | ORAL | Status: DC
  Administered 2023-07-27 – 2023-07-31 (×5): 1 via ORAL
  Filled 2023-07-26 (×5): qty 1

## 2023-07-26 MED ORDER — SUGAMMADEX SODIUM 200 MG/2ML IV SOLN
INTRAVENOUS | Status: AC
  Filled 2023-07-26: qty 2

## 2023-07-26 MED ORDER — PROPOFOL 1000 MG/100ML IV EMUL
INTRAVENOUS | Status: AC
  Filled 2023-07-26: qty 100

## 2023-07-26 MED ORDER — CEFAZOLIN SODIUM-DEXTROSE 2-4 GM/100ML-% IV SOLN
2.0000 g | Freq: Four times a day (QID) | INTRAVENOUS | Status: AC
  Administered 2023-07-26 – 2023-07-27 (×2): 2 g via INTRAVENOUS
  Filled 2023-07-26 (×2): qty 100

## 2023-07-26 MED ORDER — METHOCARBAMOL 1000 MG/10ML IJ SOLN: 500.0000 mg | Freq: Four times a day (QID) | INTRAMUSCULAR | Status: DC | PRN

## 2023-07-26 MED ORDER — ONDANSETRON HCL 4 MG/2ML IJ SOLN
4.0000 mg | Freq: Four times a day (QID) | INTRAMUSCULAR | Status: DC | PRN
  Administered 2023-07-26 – 2023-07-30 (×3): 4 mg via INTRAVENOUS
  Filled 2023-07-26 (×2): qty 2

## 2023-07-26 MED ORDER — LACTATED RINGERS IV SOLN: INTRAVENOUS | Status: DC

## 2023-07-26 MED ORDER — SUGAMMADEX SODIUM 200 MG/2ML IV SOLN
INTRAVENOUS | Status: DC | PRN
  Administered 2023-07-26: 100 mg via INTRAVENOUS

## 2023-07-26 MED ORDER — OXYCODONE HCL 5 MG PO TABS
5.0000 mg | ORAL_TABLET | ORAL | Status: DC | PRN
  Administered 2023-07-27 – 2023-07-30 (×7): 5 mg via ORAL
  Filled 2023-07-26 (×9): qty 1

## 2023-07-26 MED ORDER — TRANEXAMIC ACID-NACL 1000-0.7 MG/100ML-% IV SOLN
INTRAVENOUS | Status: AC
  Filled 2023-07-26: qty 100

## 2023-07-26 MED ORDER — PHENYLEPHRINE HCL-NACL 20-0.9 MG/250ML-% IV SOLN
INTRAVENOUS | Status: DC | PRN
  Administered 2023-07-26: 20 ug/min via INTRAVENOUS
  Administered 2023-07-26: 50 ug/min via INTRAVENOUS

## 2023-07-26 MED ORDER — LACTATED RINGERS IV SOLN: INTRAVENOUS | Status: DC | PRN

## 2023-07-26 MED ORDER — PANTOPRAZOLE SODIUM 40 MG PO TBEC
40.0000 mg | DELAYED_RELEASE_TABLET | Freq: Every day | ORAL | Status: DC
  Administered 2023-07-27 – 2023-07-31 (×5): 40 mg via ORAL
  Filled 2023-07-26 (×6): qty 1

## 2023-07-26 MED ORDER — MORPHINE SULFATE (PF) 2 MG/ML IV SOLN
2.0000 mg | INTRAVENOUS | Status: DC | PRN
  Administered 2023-07-26 – 2023-07-30 (×7): 2 mg via INTRAVENOUS
  Filled 2023-07-26 (×7): qty 1

## 2023-07-26 MED ORDER — MENTHOL 3 MG MT LOZG: 1.0000 | LOZENGE | OROMUCOSAL | Status: DC | PRN

## 2023-07-26 MED ORDER — DEXAMETHASONE SODIUM PHOSPHATE 10 MG/ML IJ SOLN
INTRAMUSCULAR | Status: DC | PRN
  Administered 2023-07-26: 10 mg via INTRAVENOUS

## 2023-07-26 MED ORDER — KETAMINE HCL 50 MG/5ML IJ SOSY
PREFILLED_SYRINGE | INTRAMUSCULAR | Status: DC | PRN
  Administered 2023-07-26: 10 mg via INTRAVENOUS
  Administered 2023-07-26: 20 mg via INTRAVENOUS

## 2023-07-26 MED ORDER — ACETAMINOPHEN 500 MG PO TABS
500.0000 mg | ORAL_TABLET | Freq: Four times a day (QID) | ORAL | Status: AC
  Administered 2023-07-26 – 2023-07-27 (×3): 500 mg via ORAL
  Filled 2023-07-26 (×3): qty 1

## 2023-07-26 MED ORDER — 0.9 % SODIUM CHLORIDE (POUR BTL) OPTIME
TOPICAL | Status: DC | PRN
  Administered 2023-07-26: 500 mL

## 2023-07-26 MED ORDER — DOCUSATE SODIUM 100 MG PO CAPS
100.0000 mg | ORAL_CAPSULE | Freq: Two times a day (BID) | ORAL | Status: DC
  Administered 2023-07-26 – 2023-07-31 (×8): 100 mg via ORAL
  Filled 2023-07-26 (×9): qty 1

## 2023-07-26 MED ORDER — METHOCARBAMOL 500 MG PO TABS
500.0000 mg | ORAL_TABLET | Freq: Four times a day (QID) | ORAL | Status: DC | PRN
  Administered 2023-07-29: 500 mg via ORAL
  Filled 2023-07-26: qty 1

## 2023-07-26 MED ORDER — CARVEDILOL 3.125 MG PO TABS
3.1250 mg | ORAL_TABLET | Freq: Two times a day (BID) | ORAL | Status: DC
  Administered 2023-07-26 – 2023-07-29 (×7): 3.125 mg via ORAL
  Filled 2023-07-26 (×7): qty 1

## 2023-07-26 MED ORDER — ACETAMINOPHEN 10 MG/ML IV SOLN: 15.0000 mg/kg | Freq: Once | INTRAVENOUS | Status: DC | PRN

## 2023-07-26 MED ORDER — SODIUM CHLORIDE (PF) 0.9 % IJ SOLN
INTRAMUSCULAR | Status: AC
  Filled 2023-07-26: qty 20

## 2023-07-26 MED ORDER — SERTRALINE HCL 50 MG PO TABS
50.0000 mg | ORAL_TABLET | Freq: Every day | ORAL | Status: DC
  Administered 2023-07-27 – 2023-07-31 (×5): 50 mg via ORAL
  Filled 2023-07-26 (×5): qty 1

## 2023-07-26 MED ORDER — ENSURE ENLIVE PO LIQD
237.0000 mL | Freq: Three times a day (TID) | ORAL | Status: DC
  Administered 2023-07-27 – 2023-07-31 (×11): 237 mL via ORAL
  Filled 2023-07-26 (×5): qty 237

## 2023-07-26 MED ORDER — SODIUM CHLORIDE (PF) 0.9 % IJ SOLN
INTRAMUSCULAR | Status: DC | PRN
  Administered 2023-07-26: 45 mL via INTRAMUSCULAR

## 2023-07-26 MED ORDER — BUPIVACAINE LIPOSOME 1.3 % IJ SUSP
INTRAMUSCULAR | Status: AC
Start: 2023-07-26 — End: ?
  Filled 2023-07-26: qty 20

## 2023-07-26 MED ORDER — ROCURONIUM BROMIDE 100 MG/10ML IV SOLN
INTRAVENOUS | Status: DC | PRN
  Administered 2023-07-26: 20 mg via INTRAVENOUS

## 2023-07-26 MED ORDER — SODIUM CHLORIDE 0.9 % IR SOLN
Status: DC | PRN
  Administered 2023-07-26: 100 mL

## 2023-07-26 MED ORDER — BUPIVACAINE-EPINEPHRINE (PF) 0.25% -1:200000 IJ SOLN
INTRAMUSCULAR | Status: AC
  Filled 2023-07-26: qty 30

## 2023-07-26 MED ORDER — VASOPRESSIN 20 UNIT/ML IV SOLN
INTRAVENOUS | Status: DC | PRN
  Administered 2023-07-26: 1 [IU] via INTRAVENOUS
  Administered 2023-07-26: 2 [IU] via INTRAVENOUS
  Administered 2023-07-26 (×2): 1 [IU] via INTRAVENOUS

## 2023-07-26 MED ORDER — METOCLOPRAMIDE HCL 5 MG/ML IJ SOLN
5.0000 mg | Freq: Three times a day (TID) | INTRAMUSCULAR | Status: DC | PRN
  Administered 2023-07-31: 10 mg via INTRAVENOUS
  Filled 2023-07-26: qty 2

## 2023-07-26 MED ORDER — LIDOCAINE HCL (CARDIAC) PF 100 MG/5ML IV SOSY
PREFILLED_SYRINGE | INTRAVENOUS | Status: DC | PRN
Start: 2023-07-26 — End: 2023-07-26
  Administered 2023-07-26: 40 mg via INTRAVENOUS

## 2023-07-26 MED ORDER — METOCLOPRAMIDE HCL 5 MG PO TABS
5.0000 mg | ORAL_TABLET | Freq: Three times a day (TID) | ORAL | Status: DC | PRN
  Administered 2023-07-30: 5 mg via ORAL
  Filled 2023-07-26: qty 1

## 2023-07-26 MED ORDER — SURGIPHOR WOUND IRRIGATION SYSTEM - OPTIME: TOPICAL | Status: DC | PRN

## 2023-07-26 SURGICAL SUPPLY — 71 items
BIT DRILL JC 5IN 2.0M 127 23FL (BIT) IMPLANT
BLADE SAGITTAL AGGR TOOTH XLG (BLADE) ×1 IMPLANT
BNDG COHESIVE 6X5 TAN ST LF (GAUZE/BANDAGES/DRESSINGS) ×1 IMPLANT
BOWL CEMENT MIXING ADV NOZZLE (MISCELLANEOUS) IMPLANT
CEMENT BONE 10-PACK (Cement) IMPLANT
CHLORAPREP W/TINT 26 (MISCELLANEOUS) ×2 IMPLANT
COVER SET STULBERG POSITIONER (MISCELLANEOUS) ×1 IMPLANT
DERMABOND ADVANCED .7 DNX12 (GAUZE/BANDAGES/DRESSINGS) ×1 IMPLANT
DRAPE IMP U-DRAPE 54X76 (DRAPES) ×1 IMPLANT
DRAPE INCISE IOBAN 66X60 STRL (DRAPES) ×1 IMPLANT
DRAPE POUCH INSTRU U-SHP 10X18 (DRAPES) ×1 IMPLANT
DRAPE SHEET LG 3/4 BI-LAMINATE (DRAPES) ×1 IMPLANT
DRAPE TABLE BACK 80X90 (DRAPES) ×1 IMPLANT
DRAPE U-SHAPE 47X51 STRL (DRAPES) ×1 IMPLANT
DRSG MEPILEX SACRM 8.7X9.8 (GAUZE/BANDAGES/DRESSINGS) ×1 IMPLANT
DRSG OPSITE POSTOP 4X10 (GAUZE/BANDAGES/DRESSINGS) IMPLANT
DRSG OPSITE POSTOP 4X12 (GAUZE/BANDAGES/DRESSINGS) IMPLANT
DRSG OPSITE POSTOP 4X8 (GAUZE/BANDAGES/DRESSINGS) IMPLANT
ELECT REM PT RETURN 9FT ADLT (ELECTROSURGICAL) ×1 IMPLANT
ELECTRODE REM PT RTRN 9FT ADLT (ELECTROSURGICAL) ×1 IMPLANT
FEMORAL HEAD LFIT V40 28MM PL0 (Orthopedic Implant) IMPLANT
GLOVE BIO SURGEON STRL SZ8 (GLOVE) ×1 IMPLANT
GLOVE BIOGEL PI IND STRL 8 (GLOVE) ×1 IMPLANT
GLOVE PI ORTHO PRO STRL 7.5 (GLOVE) ×2 IMPLANT
GLOVE PI ORTHO PRO STRL SZ8 (GLOVE) ×2 IMPLANT
GLOVE SURG SYN 7.5 E (GLOVE) ×2 IMPLANT
GLOVE SURG SYN 7.5 PF PI (GLOVE) ×2 IMPLANT
GOWN SRG XL LVL 3 NONREINFORCE (GOWNS) ×1 IMPLANT
GOWN STRL REUS W/ TWL LRG LVL3 (GOWN DISPOSABLE) ×1 IMPLANT
GOWN STRL REUS W/ TWL XL LVL3 (GOWN DISPOSABLE) ×1 IMPLANT
HANDLE YANKAUER SUCT OPEN TIP (MISCELLANEOUS) ×1 IMPLANT
HEAD OSTEO BIPOLAR UNIV 48X28 (Joint) IMPLANT
HOOD PEEL AWAY T7 (MISCELLANEOUS) ×2 IMPLANT
IV NS 100ML SINGLE PACK (IV SOLUTION) ×1 IMPLANT
IV NS IRRIG 3000ML ARTHROMATIC (IV SOLUTION) ×1 IMPLANT
KIT PREP HIP W/CEMENT RESTRICT (Miscellaneous) IMPLANT
KIT TURNOVER KIT A (KITS) ×1 IMPLANT
MANIFOLD NEPTUNE II (INSTRUMENTS) ×1 IMPLANT
MAT ABSORB FLUID 56X50 GRAY (MISCELLANEOUS) ×1 IMPLANT
NDL FILTER BLUNT 18X1 1/2 (NEEDLE) ×1 IMPLANT
NDL SAFETY ECLIPSE 18X1.5 (NEEDLE) ×1 IMPLANT
NDL SPNL 20GX3.5 QUINCKE YW (NEEDLE) ×1 IMPLANT
NEEDLE FILTER BLUNT 18X1 1/2 (NEEDLE) IMPLANT
NEEDLE SPNL 20GX3.5 QUINCKE YW (NEEDLE) ×1 IMPLANT
PACK HIP PROSTHESIS (MISCELLANEOUS) ×1 IMPLANT
PENCIL SMOKE EVACUATOR (MISCELLANEOUS) ×1 IMPLANT
PILLOW ABDUCTION FOAM SM (MISCELLANEOUS) ×1 IMPLANT
PULSAVAC PLUS IRRIG FAN TIP (DISPOSABLE) ×1 IMPLANT
RETRIEVER SUT HEWSON (MISCELLANEOUS) ×1 IMPLANT
SLEEVE SCD COMPRESS KNEE MED (STOCKING) ×1 IMPLANT
SOLUTION IRRIG SURGIPHOR (IV SOLUTION) ×1 IMPLANT
SPACER CENTRAL ACCOLADE 4/5X14 (Spacer) IMPLANT
STEM HIP ACCOLADE SZ5 37X145 (Stem) IMPLANT
SUT BONE WAX W31G (SUTURE) ×1 IMPLANT
SUT ETHIBOND #5 BRAIDED 30INL (SUTURE) ×1 IMPLANT
SUT STRATA 1 CT-1 DLB (SUTURE) ×1 IMPLANT
SUT STRATAFIX 14 PDO 48 VLT (SUTURE) ×1 IMPLANT
SUT STRATAFIX PDO 1 14 VIOLET (SUTURE) ×1 IMPLANT
SUT VIC AB 0 CT1 36 (SUTURE) ×1 IMPLANT
SUT VIC AB 2-0 CT2 27 (SUTURE) ×2 IMPLANT
SUT VICRYL 1-0 27IN ABS (SUTURE) ×1 IMPLANT
SUTURE STRATA SPIR 4-0 18 (SUTURE) ×1 IMPLANT
SUTURE VICRYL 1-0 27IN ABS (SUTURE) ×1 IMPLANT
SYR 20ML LL LF (SYRINGE) ×2 IMPLANT
SYR TB 1ML LL NO SAFETY (SYRINGE) ×1 IMPLANT
TIP FAN IRRIG PULSAVAC PLUS (DISPOSABLE) ×1 IMPLANT
TOWEL OR 17X26 4PK STRL BLUE (TOWEL DISPOSABLE) IMPLANT
TRAP FLUID SMOKE EVACUATOR (MISCELLANEOUS) ×1 IMPLANT
TRAY FOLEY SLVR 16FR LF STAT (SET/KITS/TRAYS/PACK) ×1 IMPLANT
WAND WEREWOLF FASTSEAL 6.0 (MISCELLANEOUS) ×1 IMPLANT
WATER STERILE IRR 1000ML POUR (IV SOLUTION) ×1 IMPLANT

## 2023-07-26 NOTE — Transfer of Care (Signed)
 Immediate Anesthesia Transfer of Care Note  Patient: Cheryl Hall  Procedure(s) Performed: ARTHROPLASTY BIPOLAR HIP (HEMIARTHROPLASTY) (Left: Hip)  Patient Location: PACU  Anesthesia Type:General  Level of Consciousness: drowsy and patient cooperative  Airway & Oxygen Therapy: Patient Spontanous Breathing and Patient connected to nasal cannula oxygen  Post-op Assessment: Report given to RN and Post -op Vital signs reviewed and stable  Post vital signs: Reviewed and stable  Last Vitals:  Vitals Value Taken Time  BP 165/82 07/26/23 1722  Temp    Pulse 83 07/26/23 1721  Resp 22 07/26/23 1724  SpO2 98 % 07/26/23 1722  Vitals shown include unfiled device data.  Last Pain:  Vitals:   07/26/23 1426  TempSrc: Temporal  PainSc:          Complications: No notable events documented.

## 2023-07-26 NOTE — Assessment & Plan Note (Addendum)
 Unable to urinate before surgery so Foley catheter placed  -Remove Foley catheter to give her a voiding trial

## 2023-07-26 NOTE — Anesthesia Preprocedure Evaluation (Signed)
 Anesthesia Evaluation  Patient identified by MRN, date of birth, ID band Patient confused    Reviewed: Allergy & Precautions, NPO status , Patient's Chart, lab work & pertinent test results  History of Anesthesia Complications Negative for: history of anesthetic complications  Airway Mallampati: IV   Neck ROM: Full    Dental  (+) Edentulous Upper, Edentulous Lower   Pulmonary Current Smoker and Patient abstained from smoking.   Pulmonary exam normal breath sounds clear to auscultation       Cardiovascular hypertension, Normal cardiovascular exam Rhythm:Regular Rate:Normal     Neuro/Psych  PSYCHIATRIC DISORDERS Anxiety     Chronic pain  Neuromuscular disease (peripheral neuropathy)    GI/Hepatic ,GERD  ,,  Endo/Other  negative endocrine ROS    Renal/GU Renal disease (stage III CKD)     Musculoskeletal   Abdominal   Peds  Hematology  (+) Blood dyscrasia, anemia   Anesthesia Other Findings   Reproductive/Obstetrics                             Anesthesia Physical Anesthesia Plan  ASA: 3  Anesthesia Plan: General   Post-op Pain Management:    Induction: Intravenous  PONV Risk Score and Plan: 2 and Ondansetron, Dexamethasone and Treatment may vary due to age or medical condition  Airway Management Planned: Oral ETT  Additional Equipment:   Intra-op Plan:   Post-operative Plan: Extubation in OR  Informed Consent: I have reviewed the patients History and Physical, chart, labs and discussed the procedure including the risks, benefits and alternatives for the proposed anesthesia with the patient or authorized representative who has indicated his/her understanding and acceptance.     Consent reviewed with POA  Plan Discussed with: CRNA  Anesthesia Plan Comments: (Patient's son at bedside consented for risks of anesthesia including but not limited to:  - adverse reactions to  medications - damage to eyes, teeth, lips or other oral mucosa - nerve damage due to positioning  - sore throat or hoarseness - damage to heart, brain, nerves, lungs, other parts of body or loss of life  Informed patient's son about role of CRNA in peri- and intra-operative care; he voiced understanding.)       Anesthesia Quick Evaluation

## 2023-07-26 NOTE — Assessment & Plan Note (Addendum)
 Continue Coreg.  Looks like she does not take aspirin or atorvastatin at home.

## 2023-07-26 NOTE — Assessment & Plan Note (Addendum)
 Creatinine today at 1.25. -Monitor renal function -Avoid nephrotoxins

## 2023-07-26 NOTE — Op Note (Signed)
 Patient Name: Cheryl Hall  NFA:[id number]  Pre-Operative Diagnosis: Left displaced femoral neck fracture  Post-Operative Diagnosis: (same)  Procedure: Left hip cemented hemiarthroplasty  Components/Implants:  Stem: Accolade C #5 x 127 deg cemented with 14mm distal spacer  Head: UHR bipolar 48/28 with 28+0 LFIT v40 head  Date of Surgery: 07/26/2023  Surgeon: Reinaldo Berber MD  Assistant: Reuel Boom RNFA (present and scrubbed throughout the case, critical for assistance with exposure, retraction, instrumentation, and closure)   Anesthesiologist: Mazzoni  Anesthesia: General   IVF:900cc  EBL: 150cc  Complications: None   Brief history: The patient is a 83 year old female with a displaced left femoral neck fracture. A thorough discussion about treatment options including alternatives to surgery were discussed with the patient and family and the risks and benefits of cemented hemiarthroplasty as definitive surgical fixation were reviewed.  The patient was admitted to the hospital by the medical team and preoperative optimization including risk stratification and clearance for surgery was performed. All preoperative films were reviewed and an appropriate surgical plan was made prior to surgery.   Description of procedure: The patient was brought to the operating room where laterality was confirmed by all those present to be the left side.  The patient was moved to the table and administered general anesthesia. Patient was given an intravenous dose of antibiotics for surgical prophylaxis and TXA . The patient was positioned in lateral decubitus position with all bony prominences well-padded.  Surgical site was prepped with alcohol and chlorhexidine.  Surgical site over the hip was draped in typical sterile fashion with multiple layers of adhesive and nonadhesive drapes.  The incision site over the greater trochanter posteriorly was marked out with a sterile marker.   Surgical timeout was then  called with participation of all staff in the room the patient was confirmed and laterality again confirmed.  An incision was made over the lateral aspect of the hip cheating posteriorly on the proximal aspect.  Careful soft tissue dissection and coagulation of all bleeders was carried out down to the level of the glut max fascia.  The fascia was carefully incised in line with the femur.  A Charnley retractor was placed deep to the fascia with care taken to ensure that there was no nerve entrapment in the retractor.  The bursa tissue was taken down over the posterior femur exposing the external rotators.  A dull Cobra retractor was placed under the abductor mechanism to protect the mechanism and fully expose the piriformis and short external rotators.  The external rotators were carefully detached from the femur with electrocautery and tagged with Ethibond sutures.  The capsule to the hip was incised and tagged with sutures.  Care was taken to ensure the labrum was not cut during the approach. The femoral neck fracture was encountered at this time and shown to be displaced.  The femur was rotated up and a retractor was placed behind it.  A template was used to mark out a femoral neck cleanup cut.  Cobra retractors were placed superiorly inferiorly along the remaining femoral neck to protect from saw excursion.  An oscillating saw was then used to perform a new femoral neck cut.   After the femoral and neck resection attention was turned back to the broken femoral head which remained in the acetabulum.  A corkscrew was used to carefully remove the remaining femoral head without penetrating into the acetabulum or damaging the cartilage.  The acetabulum was then carefully irrigated to remove any bony fragments or remaining  debris.  The femoral head was then measured and trial balls were used to assess the size of the acetabulum.  The acetabulum was found to accommodate a size 48 head with a good suction cup fit and  no overstuffing.  Attention was turned back to the femur and a femoral neck retractor was placed.  The femur was opened with a box osteotome and canal finder.  The femur was then sequentially broached up to a size 5 broach which allowed for good fit and fill.  A calcar planer was then used to smooth out the calcar.  A trial neck and head were then attached and the hip was reduced.  The hip was found to be stable on reduction with full range of motion without subluxation or dislocation and leg lengths felt equal.  The hip was carefully dislocated the head and neck trial were removed.  The femur was then exposed again.  A lap was placed within the acetabulum to protect from any cement.  A canal restrictor was then placed distally within the femoral canal.  Anesthesia was alerted that we were going to begin femoral preparation and cementation.  The canal was irrigated with copious saline via long tip pulsatile lavage.  The canal was then scrubbed with a brush reirrigated and aspirated.  A suction drying sponge was then placed within the canal and hooked to suction while the cement was being prepared.  A cement gun was prepared and the canal was carefully filled with cement and then pressurized.  The implant was pressed into the cement mantle with care taken to maintain an appropriate version of approximately 15 degrees and tapped into place.  Excess cement was carefully removed and the implant was held in place until the cement fully cured.  After full curing of the cement the acetabulum was reirrigated and ensured to be clear of any debris.  A trial ball was placed on the femoral component reduced and taken through range of motion.  The hip was found to be stable and reduction with a full range of motion without subluxation or dislocation with equal leg lengths.  The hip was brought up into 90 degrees of flexion and internal rotation and was stable up to 85 degrees.  The hip was fully stable in sleeper position  and to external rotation and extension.  The hip was then carefully dislocated the trial ball was removed and a real bipolar component was impacted into place and checked for stability after cleaning the trunnion. The acetabulum was irrigated and the hip was reduced.  The hip showed good range of motion and stability on testing with both stability in flexion internal rotation and extension external rotation.  The hip was then irrigated with betadine based surgiphor solution and pulsatile lavage with saline.  A 2 mm drill bit was then used to make 2 holes in the greater trochanter the piriformis and capsular tissues were reapproximated and passed through the drill holes and tied over the greater trochanter.  The fascia was then approximated with #1 Vicryl and #1 barbed suture.  The subcutaneous tissues and skin were closed with 0 Vicryl 2-0 Vicryl and 4-0 stratafix suture and the skin closed with Dermabond.  A sterile dressing was then applied.  Lap, sharps, and sponge counts were correct at the end of the case.   The patient was then rolled supine and an x-ray was taken in the operating room. Leg lengths were clinically equal on examination with a good distal pulse. Components  appeared in good position with no fractures noted on x-ray.  Patient was then transferred to a hospital bed and transferred to the recovery room in stable condition.

## 2023-07-26 NOTE — Progress Notes (Signed)
 Progress Note   Patient: Cheryl Hall WUJ:[id number] DOB: 1941/01/29 DOA: 07/25/2023     0 DOS: the patient was seen and examined on 07/26/2023   Brief hospital course: 83 year old female past medical history of CAD, hypertension, GERD, CKD, chronic back pain.  She is followed by hospice as outpatient.  She had a full and found to have a left hip fracture.  Patient is very hard of hearing.  2/28.  Dr. Audelia Acton to take to the operating room today.  Patient had a lot of pain this morning.  Assessment and Plan: * Closed displaced fracture of left femoral neck (HCC) Orthopedic surgery to take to the operating room today for hip repair.  Since the patient is followed by hospice patient is a high risk for cardiovascular complications.  Surgery must be done in order to give the patient a chance to walk again and also to decrease pain.  Case discussed with hospice liaison and recommended palliative care consultation to discuss goals of care.  Patient will need rehab after this.  Acute respiratory failure with hypoxia (HCC) Patient did have a pulse ox of 89% on 3 L.  Chest x-ray read as negative.  Essential hypertension Continue carvedilol  Coronary artery disease involving native coronary artery without angina pectoris Continue Coreg.  Looks like she does not take aspirin or atorvastatin at home.  Stage 3a chronic kidney disease (HCC) Last creatinine 1.04 with a GFR 54  Chronic back pain Looks like she takes oxycodone at home.  Acute urinary retention Unable to urinate this morning.  Foley catheter placed because she is going to the operating room for hip fracture anyway.  Hard of hearing I had to yell in her ear in order for her to hear me.  Even with me yelling unsure if she is able to comprehend.        Subjective: Patient very hard of hearing.  She was yelling out in pain and wanted something for pain when I saw her.  Also had trouble urinating.  Physical Exam: Vitals:   07/25/23  2330 07/26/23 0015 07/26/23 0241 07/26/23 1029  BP:   (!) 161/92 (!) 166/88  Pulse: 76 81 79 80  Resp:  16 20 16   Temp:   98.5 F (36.9 C) 98 F (36.7 C)  TempSrc:      SpO2: 99% 99% (!) 89% 97%  Weight:      Height:       Physical Exam HENT:     Head: Normocephalic.  Eyes:     General: Lids are normal.     Conjunctiva/sclera: Conjunctivae normal.  Cardiovascular:     Rate and Rhythm: Normal rate and regular rhythm.     Heart sounds: Normal heart sounds, S1 normal and S2 normal.  Pulmonary:     Breath sounds: Examination of the right-lower field reveals decreased breath sounds. Examination of the left-lower field reveals decreased breath sounds. Decreased breath sounds present. No wheezing, rhonchi or rales.  Abdominal:     Palpations: Abdomen is soft.     Tenderness: There is no abdominal tenderness.  Musculoskeletal:     Right ankle: No swelling.     Left ankle: No swelling.     Comments: Left leg shortened and externally rotated.  Skin:    General: Skin is warm.     Findings: No rash.  Neurological:     Mental Status: She is alert.     Data Reviewed: Chest x-ray negative, hip x-ray shows acute left  femoral neck fracture Creatinine 1.04 with a GFR of 54, hemoglobin 10.5, platelet count 151, white blood cell count 5.5  Disposition: Status is: Inpatient Remains inpatient appropriate because: Operating room today for hip fracture by orthopedic surgery  Planned Discharge Destination: Rehab    Time spent: 27 minutes Case discussed with orthopedic surgery and hospice liaison.  Author: Alford Highland, MD 07/26/2023 1:51 PM  For on call review [email or web address].

## 2023-07-26 NOTE — Assessment & Plan Note (Signed)
 I had to yell in her ear in order for her to hear me.  Even with me yelling unsure if she is able to comprehend.

## 2023-07-26 NOTE — Anesthesia Procedure Notes (Signed)
 Procedure Name: Intubation Date/Time: 07/26/2023 3:35 PM  Performed by: Maryla Morrow., CRNAPre-anesthesia Checklist: Patient identified, Patient being monitored, Timeout performed, Emergency Drugs available and Suction available Patient Re-evaluated:Patient Re-evaluated prior to induction Oxygen Delivery Method: Circle system utilized Preoxygenation: Pre-oxygenation with 100% oxygen Induction Type: IV induction Ventilation: Mask ventilation without difficulty Laryngoscope Size: 3 and McGrath Grade View: Grade I Tube type: Oral Tube size: 6.5 mm Number of attempts: 1 Airway Equipment and Method: Stylet Placement Confirmation: ETT inserted through vocal cords under direct vision, positive ETCO2 and breath sounds checked- equal and bilateral Secured at: 19 cm Tube secured with: Tape Dental Injury: Teeth and Oropharynx as per pre-operative assessment

## 2023-07-26 NOTE — H&P (Signed)
 History and Physical    Patient: Cheryl Hall:[id number] DOB: [birth year] DOA: 07/25/2023 DOS: the patient was seen and examined on 07/26/2023 PCP: Patrice Paradise, MD  Patient coming from: Home  Chief Complaint:  Chief Complaint  Patient presents with   Fall   HPI: Cheryl Hall is a 83 y.o. female with medical history significant of Mild to moderate nonobstructive CAD on [facility] 07/2019, as well as history of hypertension and GERD, CKD 3a, chronic back pain being admitted with left hip fracture after sustaining a mechanical fall.  She had no preceding lightheadedness, potation's, headache, visual disturbance, one-sided weakness numbness or tingling, chest pain or shortness of breath. ED course and data review: BP 170/95 with otherwise normal vitals Labs at baseline with creatinine 1.04 and hemoglobin 10.5. EKG pending Chest x-ray nonacute Hip x-ray shows acute left femoral neck fracture ED provider spoke with orthopedist, Dr. Aquilla Hacker who will take patient to the OR on 2/28 Patient treated with fentanyl and morphine for pain Hospitalist consulted for admission.  Review of Systems: As mentioned in the history of present illness. All other systems reviewed and are negative. Past Medical History:  Diagnosis Date   Altered mental status    Anemia    Anxiety    [name] colitis    Chronic back pain    CKD (chronic kidney disease), stage II    Coronary artery disease    a. 09/2014 NSTEMI/Cath: moderate 2-vessel disease;  b. 09/2016 NSTEMI/Cath: LM 30, LAD 50p, 33m, LCX nl, RCA 53m, 50d, EF 55-65%.   Diverticulitis    Enthesopathy of hip region    Essential hypertension    GERD (gastroesophageal reflux disease)    H/O Vertebral Fractures    History of colonoscopy    History of echocardiogram    a. 09/2016 Echo: EF 55-60%, no rwma, mildly dil Ao root (3.7cm) and Asc Ao (3.9cm), mild to mod TR.   History of esophagogastroduodenoscopy (EGD)    History of mania    History of sciatica     History of tobacco abuse    Hyperlipidemia    Lumbago    Osteopetrosis    Peripheral neuropathy    Vitamin D deficiency    Past Surgical History:  Procedure Laterality Date   CARDIAC CATHETERIZATION N/A 10/21/2014   Procedure: Left Heart Cath and Coronary Angiography;  Surgeon: Iran Ouch, MD;  Location: [facility] INVASIVE CV LAB;  Service: Cardiovascular;  Laterality: N/A;   CARDIAC CATHETERIZATION  10/21/2014   Dr. Kirke Corin   CHOLECYSTECTOMY     COLONOSCOPY WITH PROPOFOL N/A 02/20/2021   Procedure: COLONOSCOPY WITH PROPOFOL;  Surgeon: Regis Bill, MD;  Location: [facility] ENDOSCOPY;  Service: Endoscopy;  Laterality: N/A;   ESOPHAGOGASTRODUODENOSCOPY N/A 02/20/2021   Procedure: ESOPHAGOGASTRODUODENOSCOPY (EGD);  Surgeon: Regis Bill, MD;  Location: [facility] ENDOSCOPY;  Service: Endoscopy;  Laterality: N/A;   LEFT HEART CATH AND CORONARY ANGIOGRAPHY N/A 10/09/2016   Procedure: Left Heart Cath and Coronary Angiography;  Surgeon: Yvonne Kendall, MD;  Location: [facility] INVASIVE CV LAB;  Service: Cardiovascular;  Laterality: N/A;   LEFT HEART CATH AND CORONARY ANGIOGRAPHY N/A 08/17/2019   Procedure: LEFT HEART CATH AND CORONARY ANGIOGRAPHY;  Surgeon: Iran Ouch, MD;  Location: [facility] INVASIVE CV LAB;  Service: Cardiovascular;  Laterality: N/A;   VISCERAL ANGIOGRAPHY N/A 07/28/2020   Procedure: VISCERAL ANGIOGRAPHY;  Surgeon: Annice Needy, MD;  Location: [facility] INVASIVE CV LAB;  Service: Cardiovascular;  Laterality: N/A;   VISCERAL ANGIOGRAPHY N/A 09/29/2020  Procedure: VISCERAL ANGIOGRAPHY;  Surgeon: Annice Needy, MD;  Location: [facility] INVASIVE CV LAB;  Service: Cardiovascular;  Laterality: N/A;   Social History:  reports that she has been smoking cigarettes. She has a 12.5 pack-year smoking history. She has never used smokeless tobacco. She reports that she does not drink alcohol and does not use drugs.  No Known Allergies  Family History  Problem Relation Age of Onset   Other Mother         died @ 33.   Cancer Father        died of pancreatic cancer   CAD Brother        alive in late 27's w/ h/o stenting.   Breast cancer Neg Hx     Prior to Admission medications   Medication Sig Start Date End Date Taking? Authorizing Provider  acetaminophen (TYLENOL) 500 MG tablet Take 500 mg by mouth every 6 (six) hours as needed for mild pain (pain score 1-3) or headache.   Yes [provider]  alum & mag hydroxide-simeth (GERI-LANTA) 200-200-20 MG/5ML suspension Take 10 mLs by mouth every 4 (four) hours as needed for indigestion.   Yes [provider]  bisacodyl (DULCOLAX) 10 MG suppository Place 10 mg rectally 2 (two) times daily as needed. 04/12/23  Yes [provider]  calcium carbonate (TUMS - DOSED IN MG ELEMENTAL CALCIUM) 500 MG chewable tablet Chew 2 tablets by mouth every 6 (six) hours as needed for indigestion or heartburn.   Yes [provider]  furosemide (LASIX) 20 MG tablet Take 20 mg by mouth daily as needed for edema. 05/02/22  Yes [provider]  Guaifenesin (MUCINEX MAXIMUM STRENGTH) 1200 MG TB12 Take 1 tablet by mouth 2 (two) times daily as needed. 01/11/22  Yes [provider]  loperamide (IMODIUM A-D) 2 MG tablet Take 2 mg by mouth as directed.   Yes [provider]  ondansetron (ZOFRAN) 8 MG tablet Take 4 mg by mouth every 6 (six) hours as needed for nausea.   Yes [provider]  oxyCODONE (OXY IR/ROXICODONE) 5 MG immediate release tablet Take 5 mg by mouth every 4 (four) hours as needed.   Yes [provider]  oxyCODONE (OXY IR/ROXICODONE) 5 MG immediate release tablet Take 5 mg by mouth every 8 (eight) hours.   Yes [provider]  pantoprazole (PROTONIX) 40 MG tablet Take 1 tablet (40 mg total) by mouth daily. Patient taking differently: Take 40 mg by mouth 2 (two) times daily. 04/18/18  Yes Creig Hines, NP  polyethylene glycol (MIRALAX / GLYCOLAX) 17 g packet  Take 17 g by mouth daily as needed for mild constipation or moderate constipation.   Yes [provider]  potassium chloride (KLOR-CON) 10 MEQ tablet Take 10 mEq by mouth daily as needed. Take one tablet daily with as needed lasix.   Yes [provider]  sennosides-docusate sodium (SENOKOT-S) 8.6-50 MG tablet Take 2 tablets by mouth daily as needed for constipation.   Yes [provider]  sertraline (ZOLOFT) 50 MG tablet Take 50 mg by mouth daily.   Yes [provider]  aspirin EC 81 MG EC tablet Take 1 tablet (81 mg total) by mouth daily. Patient not taking: Reported on 01/05/2022 10/11/16   Katharina Caper, MD  atorvastatin (LIPITOR) 80 MG tablet TAKE 1 TABLET BY MOUTH ONCE DAILY AT  6PM Patient not taking: Reported on 01/05/2022 01/23/21   Iran Ouch, MD  carvedilol (COREG) 3.125 MG tablet  TAKE 1 TABLET BY MOUTH TWICE DAILY WITH MEALS (TAKE  AN  ADDITIONAL  TABLET  DAILY  FOR  SYSTOLIC  BP  OVER  180) Patient not taking: Reported on 07/25/2023 07/10/22   Iran Ouch, MD  isosorbide mononitrate (IMDUR) 30 MG 24 hr tablet Take 0.5 tablets (15 mg total) by mouth daily. Patient not taking: Reported on 01/05/2022 05/08/21 08/06/21  Sondra Barges, PA-C  lidocaine (LIDODERM) 5 % Place 1 patch onto the skin every 12 (twelve) hours as needed (As needed for pain). Remove & Discard patch within 12 hours or as directed by MD Patient not taking: Reported on 07/25/2023 05/08/21   Sondra Barges, PA-C    Physical Exam: Vitals:   07/25/23 2315 07/25/23 2330 07/26/23 0015 07/26/23 0241  BP:    (!) 161/92  Pulse: 79 76 81 79  Resp:   16 20  Temp:      TempSrc:      SpO2: 97% 99% 99% (!) 89%  Weight:      Height:       Physical Exam Vitals and nursing note reviewed.  Constitutional:      General: She is not in acute distress.    Comments: Uncomfortable from pain  HENT:     Head: Normocephalic and atraumatic.  Cardiovascular:     Rate and Rhythm: Normal rate and  regular rhythm.     Heart sounds: Normal heart sounds.  Pulmonary:     Effort: Pulmonary effort is normal.     Breath sounds: Normal breath sounds.  Abdominal:     Palpations: Abdomen is soft.     Tenderness: There is no abdominal tenderness.  Neurological:     Mental Status: Mental status is at baseline.     Data Reviewed: Relevant notes from primary care and specialist visits, past discharge summaries as available in EHR, including Care Everywhere. Prior diagnostic testing as pertinent to current admission diagnoses Updated medications and problem lists for reconciliation ED course, including vitals, labs, imaging, treatment and response to treatment Triage notes, nursing and pharmacy notes and ED provider's notes Notable results as noted in HPI   Assessment and Plan: * Fracture of femoral neck, left, closed (HCC) Pain control Orthopedic consult for further management N.p.o. SCDs for DVT prophylaxis Patient low to moderate risk of perioperative cardiopulmonary complication  Stage 3a chronic kidney disease (HCC) Renal function at baseline  Coronary artery disease involving native coronary artery without angina pectoris No complaints of chest pain Follow-up EKG Carvedilol, atorvastatin and isosorbide.  Will hold aspirin tonight  Essential hypertension Continue home carvedilol      Advance Care Planning:   Code Status: Prior   Consults: ortho, Dr Audelia Acton  Family Communication:   Severity of Illness: The appropriate patient status for this patient is INPATIENT. Inpatient status is judged to be reasonable and necessary in order to provide the required intensity of service to ensure the patient's safety. The patient's presenting symptoms, physical exam findings, and initial radiographic and laboratory data in the context of their chronic comorbidities is felt to place them at high risk for further clinical deterioration. Furthermore, it is not anticipated that the  patient will be medically stable for discharge from the hospital within 2 midnights of admission.   * I certify that at the point of admission it is my clinical judgment that the patient will require inpatient hospital care spanning beyond 2 midnights from the point of admission due to high intensity of service, high  risk for further deterioration and high frequency of surveillance required.*  Author: Andris Baumann, MD 07/26/2023 2:42 AM  For on call review [email or web address].

## 2023-07-26 NOTE — Progress Notes (Signed)
 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  Cheryl Hall suffered a fall and was admitted to [facility] 2.28.25 with diagnosis of left hip fracture with planned surgical repair.  Per Dr. Patric Dykes, hospice MD, this is a related hospital admission.  Spoke with patient who is resting with her eyes closed.  She awakes and responds to tactile stimulation.  Patient is very hard of hearing.  No family at bedside.  Patient has received multiple doses of pain medication through the night.  Planned for surgical repair today.  Called and spoke with patient's son, Trey Paula and [place], [place].  They express concerns with patient understanding surgery and that she will likely not be able to walk well and not be able to live independently.  They share how difficult Ms. Geurin is with allowing them to participate in her care. She is never oriented to day nor time.  She lived with them for a period of time, but was adamant she was going to live by herself and moved back into her home.  She has suffered multiple falls.  The hospice team has been working with Trey Paula & Beth to help facilitate safety and he desire to live independently.  Trey Paula expresses he does not want to sign consents for surgery.  Requested palliative consult which was ordered, but palliative not able to see patient today.  Discussed at length with Trey Paula and [facility] goals of care and options related to surgical vs. Non-surgical approach within hospice plan of care.  Both are meeting with surgeon and anesthesia  today to discuss further.  Did discuss surgery is also for pain control and reasonable for hospice plan of care.  Will continue to have discussions and collaboration with family and hospital medical team and help facilitate goals of care conversations and follow through hospitalization.  Discussed rehab facility with  hospice medicare benefit and Jeff/Beth aware pt/family would need to choose between hospice services vs. Short term rehab as both are paid for with the same Medicare dollars.  Discussed their option of revoking hospice services if they want to pursue short term rehab.  Patient is appropriate for GIP level of care requiring skilled inpatient care for symptom management of pain.  Vital Signs:  BP 161/92, P 79, R 20, Oxi 99%  Abnormal Labs:  Creat 1.04, GFR 54, RBC 3.44, HGB 10.5, HCT 34.1  IV Medications: Morphine 2mg - multiple doses Rocephine 1g IVPB Fentanyl x 1  Diagnostics:   EXAM: PORTABLE CHEST 1 VIEW   COMPARISON:  09/29/2019   FINDINGS: Cardiac shadow is enlarged. Aortic calcifications are noted. The lungs are well aerated bilaterally. No focal infiltrate or effusion is seen. No bony abnormality is noted.   IMPRESSION: No acute abnormality noted.    Electronically Signed   By: Alcide Clever M.D.   On: 07/26/2023 00:15  EXAM: DG HIP (WITH OR WITHOUT PELVIS) 2-3V LEFT   COMPARISON:  CT abdomen and pelvis 08/26/2021.   FINDINGS: There is an acute left femoral neck fracture with superolateral displacement of the distal fracture fragment. There is no dislocation. The bones are osteopenic. There is a healed left inferior pubic ramus fracture. There stable chronic compression deformity of L5.   IMPRESSION: Acute left femoral neck fracture.    Electronically Signed   By: Darliss Cheney M.D.   On: 07/26/2023 00:13     Assessment  and Plan: Per Dr. Lindajo Royal H&P 2.28.25 Fracture of femoral neck, left, closed (HCC) Pain control Orthopedic consult for further management N.p.o. SCDs for DVT prophylaxis Patient low to moderate risk of perioperative cardiopulmonary complication   Stage 3a chronic kidney disease (HCC) Renal function at baseline   Coronary artery disease involving native coronary artery without angina pectoris No complaints of chest  pain Follow-up EKG Carvedilol, atorvastatin and isosorbide.  Will hold aspirin tonight   Essential hypertension Continue home carvedilol  Discharge Planning:  ongoing IDT:  Updated Goals of Care:  ongoing and full code Family Contact:  as above- ongoing with [place] and Trey Paula. Medication list- on shadow chart  Hospital Liaison Team will continue to follow through final disposition.  Norris Cross, RN Nurse Liaison [phone]

## 2023-07-26 NOTE — Interval H&P Note (Signed)
 Patient history and physical updated. Consent reviewed including risks, benefits, and alternatives to surgery. Patient's sone agrees with above plan to proceed with left hip hemiarthroplasty and signed consent at bedside.

## 2023-07-26 NOTE — Progress Notes (Signed)
 Initial Nutrition Assessment  DOCUMENTATION CODES:   Underweight, Severe malnutrition in context of chronic illness  INTERVENTION:   -Once diet is advanced:   -Ensure Enlive po TID, each supplement provides 350 kcal and 20 grams of protein.  -MVI with minerals daily  NUTRITION DIAGNOSIS:   Severe Malnutrition related to social / environmental circumstances as evidenced by severe fat depletion, severe muscle depletion.  GOAL:   Patient will meet greater than or equal to 90% of their needs  MONITOR:   PO intake, Supplement acceptance, Diet advancement  REASON FOR ASSESSMENT:   Consult Assessment of nutrition requirement/status  ASSESSMENT:   Pt with medical history significant of Mild to moderate nonobstructive CAD on [facility] 07/2019, as well as history of hypertension and GERD, CKD 3a, chronic back pain being admitted with left hip fracture after sustaining a mechanical fall.  Pt admitted with lt femoral neck fracture.   Per orthopedics notes, plan for surgery today. Pt NPO.    Pt sleeping soundly, but woke up at time of visit. Pt unable to provide history. Pt hard of hearing and states she is in a lot of pain.   Reviewed wt hx; wt has been stable over the past year.   Palliative care consult pending for goals of carte.   Medications reviewed and include protonix.   Labs reviewed.  NUTRITION - FOCUSED PHYSICAL EXAM:  Flowsheet Row Most Recent Value  Orbital Region Severe depletion  Upper Arm Region Severe depletion  Thoracic and Lumbar Region Severe depletion  Buccal Region Severe depletion  Temple Region Severe depletion  Clavicle Bone Region Severe depletion  Clavicle and Acromion Bone Region Severe depletion  Scapular Bone Region Severe depletion  Dorsal Hand Severe depletion  Patellar Region Severe depletion  Anterior Thigh Region Severe depletion  Posterior Calf Region Moderate depletion  Edema (RD Assessment) Mild  Hair Reviewed  Eyes Reviewed  Mouth  Reviewed  Skin Reviewed  Nails Reviewed       Diet Order:   Diet Order             Diet NPO time specified Except for: Sips with Meds  Diet effective now                   EDUCATION NEEDS:   Not appropriate for education at this time  Skin:  Skin Assessment: Reviewed RN Assessment  Last BM:  07/24/23  Height:   Ht Readings from Last 1 Encounters:  07/25/23 5\' 3"  (1.6 m)    Weight:   Wt Readings from Last 1 Encounters:  07/25/23 44.8 kg    Ideal Body Weight:  52.3 kg  BMI:  Body mass index is 17.48 kg/m.  Estimated Nutritional Needs:   Kcal:  1600-1800  Protein:  85-100 grams  Fluid:  > 1.6 L    Levada Schilling, RD, LDN, CDCES Registered Dietitian III Certified Diabetes Care and Education Specialist If unable to reach this RD, please use "RD Inpatient" group chat on secure chat between hours of 8am-4 pm daily

## 2023-07-26 NOTE — Hospital Course (Addendum)
 83 year old female past medical history of CAD, hypertension, GERD, CKD, chronic back pain.  She is followed by hospice as outpatient.  She had a fall and found to have a left hip fracture.  Patient is very hard of hearing.  2/28.  Dr. Audelia Acton to take to the operating room today.  Patient had a lot of pain this morning.  3/1: Vital stable, s/p left hip cemented hemiarthroplasty.  Hemoglobin decreased to 9.6 postoperatively, platelet of 107, slight worsening of creatinine to 1.25.  Pending PT and OT evaluation. Foley catheter to be removed today to give her a voiding trial.  3/3: Hemodynamically stable.  Awaiting SNF placement. Patient failed voiding trial so Foley catheter was replaced-need to see a urologist as outpatient. Orthopedic is recommending Lovenox for 2 weeks on discharge and outpatient follow-up.  Hemoglobin at 8.2  3/4: Hemodynamically stable, blood pressure elevated.  Hemoglobin stable with small improvement to 8.4.  Anemia panel consistent with anemia of chronic disease with mild iron deficiency.  Increasing the dose of Coreg and also added low-dose losartan.  3/5: Remained hemodynamically stable.  Mild intermittent nausea without any vomiting.  Patient is being discharged to SNF for further rehab as recommended by our physical therapist.  Patient will continue following with hospice and they will take over once improved from current illness.  Patient will continue on current medications.  She was provided with 2 weeks of Lovenox for DVT prophylaxis.  As patient failed voiding trial and Foley catheter was replaced.  She need to see a outpatient urologist for further assistance.  She is being discharged with Foley catheter in place.  Patient will continue on current medications and need to have a close follow-up with her providers for further assistance.

## 2023-07-26 NOTE — Consult Note (Signed)
 ORTHOPAEDIC CONSULTATION  REQUESTING PHYSICIAN: Alford Highland, MD  Chief Complaint:   Left femoral neck fracture  History of Present Illness: Cheryl Hall is a 83 y.o. female  with medical history significant of Mild to moderate nonobstructive CAD on [facility] 07/2019, as well as history of hypertension and GERD, CKD 3a, chronic back pain who presented to the emergency room after a mechanical fall and was found to have a displaced left femoral neck fracture.  She denies any pre-existing hip pain prior to the fall but is extremely hard of hearing on exam and have to screen into the patient's ear in order to get any simple questions answered.  She is oriented to person and that she is in [place] at hospital but does not know the year or other topical examination.  Difficult to ascertain whether this is due to mental status or how hard of hearing she is.  I called and discussed the case with her son as well and he will be present today to assist with history and for consent purposes.  Patient denies any numbness or tingling in the leg at this time but endorses severe pain in the left hip.  Past Medical History:  Diagnosis Date   Altered mental status    Anemia    Anxiety    [name] colitis    Chronic back pain    CKD (chronic kidney disease), stage II    Coronary artery disease    a. 09/2014 NSTEMI/Cath: moderate 2-vessel disease;  b. 09/2016 NSTEMI/Cath: LM 30, LAD 50p, 39m, LCX nl, RCA 52m, 50d, EF 55-65%.   Diverticulitis    Enthesopathy of hip region    Essential hypertension    GERD (gastroesophageal reflux disease)    H/O Vertebral Fractures    History of colonoscopy    History of echocardiogram    a. 09/2016 Echo: EF 55-60%, no rwma, mildly dil Ao root (3.7cm) and Asc Ao (3.9cm), mild to mod TR.   History of esophagogastroduodenoscopy (EGD)    History of mania    History of sciatica    History of tobacco abuse     Hyperlipidemia    Lumbago    Osteopetrosis    Peripheral neuropathy    Vitamin D deficiency    Past Surgical History:  Procedure Laterality Date   CARDIAC CATHETERIZATION N/A 10/21/2014   Procedure: Left Heart Cath and Coronary Angiography;  Surgeon: Iran Ouch, MD;  Location: [facility] INVASIVE CV LAB;  Service: Cardiovascular;  Laterality: N/A;   CARDIAC CATHETERIZATION  10/21/2014   Dr. Kirke Corin   CHOLECYSTECTOMY     COLONOSCOPY WITH PROPOFOL N/A 02/20/2021   Procedure: COLONOSCOPY WITH PROPOFOL;  Surgeon: Regis Bill, MD;  Location: [facility] ENDOSCOPY;  Service: Endoscopy;  Laterality: N/A;   ESOPHAGOGASTRODUODENOSCOPY N/A 02/20/2021   Procedure: ESOPHAGOGASTRODUODENOSCOPY (EGD);  Surgeon: Regis Bill, MD;  Location: [facility] ENDOSCOPY;  Service: Endoscopy;  Laterality: N/A;   LEFT HEART CATH AND CORONARY ANGIOGRAPHY N/A 10/09/2016   Procedure: Left Heart Cath and Coronary Angiography;  Surgeon: Yvonne Kendall, MD;  Location: [facility] INVASIVE CV LAB;  Service: Cardiovascular;  Laterality: N/A;   LEFT HEART CATH AND CORONARY ANGIOGRAPHY N/A 08/17/2019   Procedure: LEFT HEART CATH AND CORONARY ANGIOGRAPHY;  Surgeon: Iran Ouch, MD;  Location: [facility] INVASIVE CV LAB;  Service: Cardiovascular;  Laterality: N/A;   VISCERAL ANGIOGRAPHY N/A 07/28/2020   Procedure: VISCERAL ANGIOGRAPHY;  Surgeon: Annice Needy, MD;  Location: [facility] INVASIVE CV LAB;  Service: Cardiovascular;  Laterality:  N/A;   VISCERAL ANGIOGRAPHY N/A 09/29/2020   Procedure: VISCERAL ANGIOGRAPHY;  Surgeon: Annice Needy, MD;  Location: [facility] INVASIVE CV LAB;  Service: Cardiovascular;  Laterality: N/A;   Social History   Socioeconomic History   Marital status: Divorced    Spouse name: Not on file   Number of children: Not on file   Years of education: Not on file   Highest education level: Not on file  Occupational History   Not on file  Tobacco Use   Smoking status: Some Days    Current packs/day: 0.25    Average  packs/day: 0.3 packs/day for 50.0 years (12.5 ttl pk-yrs)    Types: Cigarettes   Smokeless tobacco: Never   Tobacco comments:    Smoking 2 cigarettes every other day.   Vaping Use   Vaping status: Never Used  Substance and Sexual Activity   Alcohol use: No    Alcohol/week: 0.0 standard drinks of alcohol   Drug use: No   Sexual activity: Not on file  Other Topics Concern   Not on file  Social History Narrative   Lives in [place] by herself.  Does not routinely exercise.  Son nearby.   Social Drivers of Corporate investment banker Strain: Low Risk  (03/16/2021)   Received from [facility], [facility]   Overall Financial Resource Strain (CARDIA)    Difficulty of Paying Living Expenses: Not hard at all  Food Insecurity: No Food Insecurity (07/26/2023)   Hunger Vital Sign    Worried About Running Out of Food in the Last Year: Never true    Ran Out of Food in the Last Year: Never true  Transportation Needs: Patient Declined (07/26/2023)   PRAPARE - Administrator, Civil Service (Medical): Patient declined    Lack of Transportation (Non-Medical): Patient declined  Physical Activity: Not on file  Stress: Not on file  Social Connections: Unknown (07/26/2023)   Social Connection and Isolation Panel [NHANES]    Frequency of Communication with Friends and Family: Once a week    Frequency of Social Gatherings with Friends and Family: Once a week    Attends Religious Services: 1 to 4 times per year    Active Member of [employer] or Organizations: Patient declined    Attends Banker Meetings: Patient declined    Marital Status: Patient declined   Family History  Problem Relation Age of Onset   Other Mother        died @ 56.   Cancer Father        died of pancreatic cancer   CAD Brother        alive in late 2's w/ h/o stenting.   Breast cancer Neg Hx    No Known Allergies Prior to Admission medications   Medication Sig Start Date End Date Taking?  Authorizing Provider  acetaminophen (TYLENOL) 500 MG tablet Take 500 mg by mouth every 6 (six) hours as needed for mild pain (pain score 1-3) or headache.   Yes [provider]  alum & mag hydroxide-simeth (GERI-LANTA) 200-200-20 MG/5ML suspension Take 10 mLs by mouth every 4 (four) hours as needed for indigestion.   Yes [provider]  bisacodyl (DULCOLAX) 10 MG suppository Place 10 mg rectally 2 (two) times daily as needed. 04/12/23  Yes [provider]  calcium carbonate (TUMS - DOSED IN MG ELEMENTAL CALCIUM) 500 MG chewable tablet Chew 2 tablets by mouth every 6 (six) hours as needed for  indigestion or heartburn.   Yes [provider]  furosemide (LASIX) 20 MG tablet Take 20 mg by mouth daily as needed for edema. 05/02/22  Yes [provider]  Guaifenesin (MUCINEX MAXIMUM STRENGTH) 1200 MG TB12 Take 1 tablet by mouth 2 (two) times daily as needed. 01/11/22  Yes [provider]  loperamide (IMODIUM A-D) 2 MG tablet Take 2 mg by mouth as directed.   Yes [provider]  ondansetron (ZOFRAN) 8 MG tablet Take 4 mg by mouth every 6 (six) hours as needed for nausea.   Yes [provider]  oxyCODONE (OXY IR/ROXICODONE) 5 MG immediate release tablet Take 5 mg by mouth every 4 (four) hours as needed.   Yes [provider]  oxyCODONE (OXY IR/ROXICODONE) 5 MG immediate release tablet Take 5 mg by mouth every 8 (eight) hours.   Yes [provider]  pantoprazole (PROTONIX) 40 MG tablet Take 1 tablet (40 mg total) by mouth daily. Patient taking differently: Take 40 mg by mouth 2 (two) times daily. 04/18/18  Yes Creig Hines, NP  polyethylene glycol (MIRALAX / GLYCOLAX) 17 g packet Take 17 g by mouth daily as needed for mild constipation or moderate constipation.   Yes [provider]  potassium chloride (KLOR-CON) 10 MEQ tablet Take 10 mEq by mouth daily as needed. Take one tablet daily with as needed  lasix.   Yes [provider]  sennosides-docusate sodium (SENOKOT-S) 8.6-50 MG tablet Take 2 tablets by mouth daily as needed for constipation.   Yes [provider]  sertraline (ZOLOFT) 50 MG tablet Take 50 mg by mouth daily.   Yes [provider]  aspirin EC 81 MG EC tablet Take 1 tablet (81 mg total) by mouth daily. Patient not taking: Reported on 01/05/2022 10/11/16   Katharina Caper, MD  atorvastatin (LIPITOR) 80 MG tablet TAKE 1 TABLET BY MOUTH ONCE DAILY AT  6PM Patient not taking: Reported on 01/05/2022 01/23/21   Iran Ouch, MD  carvedilol (COREG) 3.125 MG tablet TAKE 1 TABLET BY MOUTH TWICE DAILY WITH MEALS (TAKE  AN  ADDITIONAL  TABLET  DAILY  FOR  SYSTOLIC  BP  OVER  180) Patient not taking: Reported on 07/25/2023 07/10/22   Iran Ouch, MD  isosorbide mononitrate (IMDUR) 30 MG 24 hr tablet Take 0.5 tablets (15 mg total) by mouth daily. Patient not taking: Reported on 01/05/2022 05/08/21 08/06/21  Sondra Barges, PA-C  lidocaine (LIDODERM) 5 % Place 1 patch onto the skin every 12 (twelve) hours as needed (As needed for pain). Remove & Discard patch within 12 hours or as directed by MD Patient not taking: Reported on 07/25/2023 05/08/21   Sondra Barges, PA-C   DG Chest 1 View Result Date: 07/26/2023 CLINICAL DATA:  Recent fall with known left hip fracture, initial encounter EXAM: PORTABLE CHEST 1 VIEW COMPARISON:  09/29/2019 FINDINGS: Cardiac shadow is enlarged. Aortic calcifications are noted. The lungs are well aerated bilaterally. No focal infiltrate or effusion is seen. No bony abnormality is noted. IMPRESSION: No acute abnormality noted. Electronically Signed   By: Alcide Clever M.D.   On: 07/26/2023 00:15   DG HIP UNILAT WITH PELVIS 2-3 VIEWS LEFT Result Date: 07/26/2023 CLINICAL DATA:  Fall EXAM: DG HIP (WITH OR WITHOUT PELVIS) 2-3V LEFT COMPARISON:  CT abdomen and pelvis 08/26/2021. FINDINGS: There is an acute left femoral neck fracture with  superolateral displacement of the distal fracture fragment. There is no dislocation. The bones are osteopenic.  There is a healed left inferior pubic ramus fracture. There stable chronic compression deformity of L5. IMPRESSION: Acute left femoral neck fracture. Electronically Signed   By: Darliss Cheney M.D.   On: 07/26/2023 00:13    Positive ROS: All other systems have been reviewed and were otherwise negative with the exception of those mentioned in the HPI and as above.  Physical Exam: General:  Alert, no acute distress Psychiatric: Patient is oriented to person the fact that she is at [facility] and out of the hospital she is not oriented to year Cardiovascular:  No pedal edema Respiratory:  No wheezing, non-labored breathing GI:  Abdomen is soft and non-tender Skin:  No lesions in the area of chief complaint Neurologic:  Sensation intact distally Lymphatic:  No axillary or cervical lymphadenopathy  Orthopedic Exam:  Lower extremity Skin intact over the hip limb is shortened and externally rotated Tender to palpation over the hip no tenderness palpation over the distal femur, knee, ankle, tibia, foot Pain with any motion of the left leg All compartments soft neurovascular intact distally able dorsiflex and plantarflex the foot with brisk capillary refill  Secondary survey No tenderness to palpation over other bony prominences in the lower extremities or bilateral upper extremities No pain with logroll or simulated axial loading of the right lower extremity All compartments soft No tenderness to palpation over the cervical or thoracic spine, no bony step-off Motor grossly intact throughout, no focal deficits Sensation grossly intact throughout, no focal deficits Good distal pulses and capillary refill on all extremities   X-rays:  X-rays of the AP pelvis and left hip images and report reviewed by myself shows an acute appearing displaced left femoral neck fracture.  Agree with  radiologist interpretation.  Assessment: Displaced left femoral neck fracture  Plan: The patient is an 83 year old female who presents with a displaced left femoral neck fracture.  I discussed the nature and clinical course and treatment options for a displaced femoral neck fracture with the patient and over the phone with her son.  Under shared decision making model discussing with both they have agreed with the plan to move forward with surgical intervention for her left hip.  She will need a left hip hemiarthroplasty.  A long discussion took place with the patient describing what a hemiarthroplasty is and what the procedure would entail. The xrays were reviewed with the patient and the implants were discussed. The ability to secure the implant utilizing cement/ or cementless (press fit) fixation was discussed. Surgical exposures were discussed with the patient.    The hospitalization and post-operative care and rehabilitation were also discussed. The use of perioperative antibiotics and DVT prophylaxis were discussed. The risk, benefits and alternatives to a surgical intervention were discussed at length with the patient. The patient was also advised of risks related to the medical comorbidities. A lengthy discussion took place to review the most common complications including but not limited to: deep vein thrombosis, pulmonary embolus, heart attack, stroke, infection, wound breakdown, dislocation, numbness, leg length in-equality, damage to nerves, intraoperative fracture, tendon,muscles, arteries or other blood vessels, death and other possible complications from anesthesia. The patient was told that we will take steps to minimize these risks by using sterile technique, antibiotics and DVT prophylaxis when appropriate and follow the patient postoperatively in the office setting to monitor progress. The possibility of recurrent pain, no improvement in pain and actual worsening of pain were also discussed  with the patient. The risk of dislocation following surgery was  discussed and potential precautions to prevent dislocation were reviewed.      The benefits of surgery were discussed with the patient and her son including the potential for improving the patient's current clinical condition through operative intervention. Alternatives to surgical intervention including conservative management were also discussed in detail. All questions were answered to the satisfaction of the patient. The patient and her son participated and agreed to the plan of care as well as the use of the recommended implants for their surgery.    Plan for surgery today 07/26/2023 N.p.o. for the operating room Hold anticoagulation  We will fill out consent in the preoperative holding area with the son present  Reinaldo Berber MD     Reinaldo Berber MD  Beeper #:  [phone]  07/26/2023 8:56 AM

## 2023-07-26 NOTE — Assessment & Plan Note (Signed)
 Looks like she takes oxycodone at home.

## 2023-07-26 NOTE — Assessment & Plan Note (Addendum)
 Blood pressure elevated. -Increasing the dose of Coreg to 6.25 mg twice daily -Add low-dose losartan -Continue to monitor

## 2023-07-26 NOTE — Assessment & Plan Note (Addendum)
 S/p hemiarthroplasty.  Patient tolerated the procedure well, small decrease in hemoglobin to 9.6 after the surgery. Palliative care was consulted as recommended by hospice earlier but they would like hospice to take over her needs. -Continue with supportive care and pain management -Pending PT and OT evaluation

## 2023-07-26 NOTE — Assessment & Plan Note (Addendum)
 Chest x-ray read as negative.  Currently saturating well on 2 L -Try weaning oxygen as tolerated

## 2023-07-26 NOTE — Anesthesia Postprocedure Evaluation (Signed)
 Anesthesia Post Note  Patient: Cheryl Hall  Procedure(s) Performed: ARTHROPLASTY BIPOLAR HIP (HEMIARTHROPLASTY) (Left: Hip)  Patient location during evaluation: PACU Anesthesia Type: General Level of consciousness: awake and alert Pain management: pain level controlled Vital Signs Assessment: post-procedure vital signs reviewed and stable Respiratory status: spontaneous breathing, nonlabored ventilation, respiratory function stable and patient connected to nasal cannula oxygen Cardiovascular status: blood pressure returned to baseline and stable Postop Assessment: no apparent nausea or vomiting Anesthetic complications: no   No notable events documented.   Last Vitals:  Vitals:   07/26/23 1745 07/26/23 1808  BP: (!) 171/97 (!) 146/100  Pulse: 84 (!) 106  Resp: 18 18  Temp: 36.7 C 36.4 C  SpO2: 96% 96%    Last Pain:  Vitals:   07/26/23 1808  TempSrc: Oral  PainSc:                  Corinda Gubler

## 2023-07-26 NOTE — Plan of Care (Signed)

## 2023-07-26 NOTE — H&P (View-Only) (Signed)
 ORTHOPAEDIC CONSULTATION  REQUESTING PHYSICIAN: Alford Highland, MD  Chief Complaint:   Left femoral neck fracture  History of Present Illness: Cheryl Hall is a 83 y.o. female  with medical history significant of Mild to moderate nonobstructive CAD on [facility] 07/2019, as well as history of hypertension and GERD, CKD 3a, chronic back pain who presented to the emergency room after a mechanical fall and was found to have a displaced left femoral neck fracture.  She denies any pre-existing hip pain prior to the fall but is extremely hard of hearing on exam and have to screen into the patient's ear in order to get any simple questions answered.  She is oriented to person and that she is in [place] at hospital but does not know the year or other topical examination.  Difficult to ascertain whether this is due to mental status or how hard of hearing she is.  I called and discussed the case with her son as well and he will be present today to assist with history and for consent purposes.  Patient denies any numbness or tingling in the leg at this time but endorses severe pain in the left hip.  Past Medical History:  Diagnosis Date   Altered mental status    Anemia    Anxiety    [name] colitis    Chronic back pain    CKD (chronic kidney disease), stage II    Coronary artery disease    a. 09/2014 NSTEMI/Cath: moderate 2-vessel disease;  b. 09/2016 NSTEMI/Cath: LM 30, LAD 50p, 39m, LCX nl, RCA 52m, 50d, EF 55-65%.   Diverticulitis    Enthesopathy of hip region    Essential hypertension    GERD (gastroesophageal reflux disease)    H/O Vertebral Fractures    History of colonoscopy    History of echocardiogram    a. 09/2016 Echo: EF 55-60%, no rwma, mildly dil Ao root (3.7cm) and Asc Ao (3.9cm), mild to mod TR.   History of esophagogastroduodenoscopy (EGD)    History of mania    History of sciatica    History of tobacco abuse     Hyperlipidemia    Lumbago    Osteopetrosis    Peripheral neuropathy    Vitamin D deficiency    Past Surgical History:  Procedure Laterality Date   CARDIAC CATHETERIZATION N/A 10/21/2014   Procedure: Left Heart Cath and Coronary Angiography;  Surgeon: Iran Ouch, MD;  Location: [facility] INVASIVE CV LAB;  Service: Cardiovascular;  Laterality: N/A;   CARDIAC CATHETERIZATION  10/21/2014   Dr. Kirke Corin   CHOLECYSTECTOMY     COLONOSCOPY WITH PROPOFOL N/A 02/20/2021   Procedure: COLONOSCOPY WITH PROPOFOL;  Surgeon: Regis Bill, MD;  Location: [facility] ENDOSCOPY;  Service: Endoscopy;  Laterality: N/A;   ESOPHAGOGASTRODUODENOSCOPY N/A 02/20/2021   Procedure: ESOPHAGOGASTRODUODENOSCOPY (EGD);  Surgeon: Regis Bill, MD;  Location: [facility] ENDOSCOPY;  Service: Endoscopy;  Laterality: N/A;   LEFT HEART CATH AND CORONARY ANGIOGRAPHY N/A 10/09/2016   Procedure: Left Heart Cath and Coronary Angiography;  Surgeon: Yvonne Kendall, MD;  Location: [facility] INVASIVE CV LAB;  Service: Cardiovascular;  Laterality: N/A;   LEFT HEART CATH AND CORONARY ANGIOGRAPHY N/A 08/17/2019   Procedure: LEFT HEART CATH AND CORONARY ANGIOGRAPHY;  Surgeon: Iran Ouch, MD;  Location: [facility] INVASIVE CV LAB;  Service: Cardiovascular;  Laterality: N/A;   VISCERAL ANGIOGRAPHY N/A 07/28/2020   Procedure: VISCERAL ANGIOGRAPHY;  Surgeon: Annice Needy, MD;  Location: [facility] INVASIVE CV LAB;  Service: Cardiovascular;  Laterality:  N/A;   VISCERAL ANGIOGRAPHY N/A 09/29/2020   Procedure: VISCERAL ANGIOGRAPHY;  Surgeon: Annice Needy, MD;  Location: [facility] INVASIVE CV LAB;  Service: Cardiovascular;  Laterality: N/A;   Social History   Socioeconomic History   Marital status: Divorced    Spouse name: Not on file   Number of children: Not on file   Years of education: Not on file   Highest education level: Not on file  Occupational History   Not on file  Tobacco Use   Smoking status: Some Days    Current packs/day: 0.25    Average  packs/day: 0.3 packs/day for 50.0 years (12.5 ttl pk-yrs)    Types: Cigarettes   Smokeless tobacco: Never   Tobacco comments:    Smoking 2 cigarettes every other day.   Vaping Use   Vaping status: Never Used  Substance and Sexual Activity   Alcohol use: No    Alcohol/week: 0.0 standard drinks of alcohol   Drug use: No   Sexual activity: Not on file  Other Topics Concern   Not on file  Social History Narrative   Lives in [place] by herself.  Does not routinely exercise.  Son nearby.   Social Drivers of Corporate investment banker Strain: Low Risk  (03/16/2021)   Received from [facility], [facility]   Overall Financial Resource Strain (CARDIA)    Difficulty of Paying Living Expenses: Not hard at all  Food Insecurity: No Food Insecurity (07/26/2023)   Hunger Vital Sign    Worried About Running Out of Food in the Last Year: Never true    Ran Out of Food in the Last Year: Never true  Transportation Needs: Patient Declined (07/26/2023)   PRAPARE - Administrator, Civil Service (Medical): Patient declined    Lack of Transportation (Non-Medical): Patient declined  Physical Activity: Not on file  Stress: Not on file  Social Connections: Unknown (07/26/2023)   Social Connection and Isolation Panel [NHANES]    Frequency of Communication with Friends and Family: Once a week    Frequency of Social Gatherings with Friends and Family: Once a week    Attends Religious Services: 1 to 4 times per year    Active Member of [employer] or Organizations: Patient declined    Attends Banker Meetings: Patient declined    Marital Status: Patient declined   Family History  Problem Relation Age of Onset   Other Mother        died @ 56.   Cancer Father        died of pancreatic cancer   CAD Brother        alive in late 2's w/ h/o stenting.   Breast cancer Neg Hx    No Known Allergies Prior to Admission medications   Medication Sig Start Date End Date Taking?  Authorizing Provider  acetaminophen (TYLENOL) 500 MG tablet Take 500 mg by mouth every 6 (six) hours as needed for mild pain (pain score 1-3) or headache.   Yes [provider]  alum & mag hydroxide-simeth (GERI-LANTA) 200-200-20 MG/5ML suspension Take 10 mLs by mouth every 4 (four) hours as needed for indigestion.   Yes [provider]  bisacodyl (DULCOLAX) 10 MG suppository Place 10 mg rectally 2 (two) times daily as needed. 04/12/23  Yes [provider]  calcium carbonate (TUMS - DOSED IN MG ELEMENTAL CALCIUM) 500 MG chewable tablet Chew 2 tablets by mouth every 6 (six) hours as needed for  indigestion or heartburn.   Yes [provider]  furosemide (LASIX) 20 MG tablet Take 20 mg by mouth daily as needed for edema. 05/02/22  Yes [provider]  Guaifenesin (MUCINEX MAXIMUM STRENGTH) 1200 MG TB12 Take 1 tablet by mouth 2 (two) times daily as needed. 01/11/22  Yes [provider]  loperamide (IMODIUM A-D) 2 MG tablet Take 2 mg by mouth as directed.   Yes [provider]  ondansetron (ZOFRAN) 8 MG tablet Take 4 mg by mouth every 6 (six) hours as needed for nausea.   Yes [provider]  oxyCODONE (OXY IR/ROXICODONE) 5 MG immediate release tablet Take 5 mg by mouth every 4 (four) hours as needed.   Yes [provider]  oxyCODONE (OXY IR/ROXICODONE) 5 MG immediate release tablet Take 5 mg by mouth every 8 (eight) hours.   Yes [provider]  pantoprazole (PROTONIX) 40 MG tablet Take 1 tablet (40 mg total) by mouth daily. Patient taking differently: Take 40 mg by mouth 2 (two) times daily. 04/18/18  Yes Creig Hines, NP  polyethylene glycol (MIRALAX / GLYCOLAX) 17 g packet Take 17 g by mouth daily as needed for mild constipation or moderate constipation.   Yes [provider]  potassium chloride (KLOR-CON) 10 MEQ tablet Take 10 mEq by mouth daily as needed. Take one tablet daily with as needed  lasix.   Yes [provider]  sennosides-docusate sodium (SENOKOT-S) 8.6-50 MG tablet Take 2 tablets by mouth daily as needed for constipation.   Yes [provider]  sertraline (ZOLOFT) 50 MG tablet Take 50 mg by mouth daily.   Yes [provider]  aspirin EC 81 MG EC tablet Take 1 tablet (81 mg total) by mouth daily. Patient not taking: Reported on 01/05/2022 10/11/16   Katharina Caper, MD  atorvastatin (LIPITOR) 80 MG tablet TAKE 1 TABLET BY MOUTH ONCE DAILY AT  6PM Patient not taking: Reported on 01/05/2022 01/23/21   Iran Ouch, MD  carvedilol (COREG) 3.125 MG tablet TAKE 1 TABLET BY MOUTH TWICE DAILY WITH MEALS (TAKE  AN  ADDITIONAL  TABLET  DAILY  FOR  SYSTOLIC  BP  OVER  180) Patient not taking: Reported on 07/25/2023 07/10/22   Iran Ouch, MD  isosorbide mononitrate (IMDUR) 30 MG 24 hr tablet Take 0.5 tablets (15 mg total) by mouth daily. Patient not taking: Reported on 01/05/2022 05/08/21 08/06/21  Sondra Barges, PA-C  lidocaine (LIDODERM) 5 % Place 1 patch onto the skin every 12 (twelve) hours as needed (As needed for pain). Remove & Discard patch within 12 hours or as directed by MD Patient not taking: Reported on 07/25/2023 05/08/21   Sondra Barges, PA-C   DG Chest 1 View Result Date: 07/26/2023 CLINICAL DATA:  Recent fall with known left hip fracture, initial encounter EXAM: PORTABLE CHEST 1 VIEW COMPARISON:  09/29/2019 FINDINGS: Cardiac shadow is enlarged. Aortic calcifications are noted. The lungs are well aerated bilaterally. No focal infiltrate or effusion is seen. No bony abnormality is noted. IMPRESSION: No acute abnormality noted. Electronically Signed   By: Alcide Clever M.D.   On: 07/26/2023 00:15   DG HIP UNILAT WITH PELVIS 2-3 VIEWS LEFT Result Date: 07/26/2023 CLINICAL DATA:  Fall EXAM: DG HIP (WITH OR WITHOUT PELVIS) 2-3V LEFT COMPARISON:  CT abdomen and pelvis 08/26/2021. FINDINGS: There is an acute left femoral neck fracture with  superolateral displacement of the distal fracture fragment. There is no dislocation. The bones are osteopenic.  There is a healed left inferior pubic ramus fracture. There stable chronic compression deformity of L5. IMPRESSION: Acute left femoral neck fracture. Electronically Signed   By: Darliss Cheney M.D.   On: 07/26/2023 00:13    Positive ROS: All other systems have been reviewed and were otherwise negative with the exception of those mentioned in the HPI and as above.  Physical Exam: General:  Alert, no acute distress Psychiatric: Patient is oriented to person the fact that she is at [facility] and out of the hospital she is not oriented to year Cardiovascular:  No pedal edema Respiratory:  No wheezing, non-labored breathing GI:  Abdomen is soft and non-tender Skin:  No lesions in the area of chief complaint Neurologic:  Sensation intact distally Lymphatic:  No axillary or cervical lymphadenopathy  Orthopedic Exam:  Lower extremity Skin intact over the hip limb is shortened and externally rotated Tender to palpation over the hip no tenderness palpation over the distal femur, knee, ankle, tibia, foot Pain with any motion of the left leg All compartments soft neurovascular intact distally able dorsiflex and plantarflex the foot with brisk capillary refill  Secondary survey No tenderness to palpation over other bony prominences in the lower extremities or bilateral upper extremities No pain with logroll or simulated axial loading of the right lower extremity All compartments soft No tenderness to palpation over the cervical or thoracic spine, no bony step-off Motor grossly intact throughout, no focal deficits Sensation grossly intact throughout, no focal deficits Good distal pulses and capillary refill on all extremities   X-rays:  X-rays of the AP pelvis and left hip images and report reviewed by myself shows an acute appearing displaced left femoral neck fracture.  Agree with  radiologist interpretation.  Assessment: Displaced left femoral neck fracture  Plan: The patient is an 83 year old female who presents with a displaced left femoral neck fracture.  I discussed the nature and clinical course and treatment options for a displaced femoral neck fracture with the patient and over the phone with her son.  Under shared decision making model discussing with both they have agreed with the plan to move forward with surgical intervention for her left hip.  She will need a left hip hemiarthroplasty.  A long discussion took place with the patient describing what a hemiarthroplasty is and what the procedure would entail. The xrays were reviewed with the patient and the implants were discussed. The ability to secure the implant utilizing cement/ or cementless (press fit) fixation was discussed. Surgical exposures were discussed with the patient.    The hospitalization and post-operative care and rehabilitation were also discussed. The use of perioperative antibiotics and DVT prophylaxis were discussed. The risk, benefits and alternatives to a surgical intervention were discussed at length with the patient. The patient was also advised of risks related to the medical comorbidities. A lengthy discussion took place to review the most common complications including but not limited to: deep vein thrombosis, pulmonary embolus, heart attack, stroke, infection, wound breakdown, dislocation, numbness, leg length in-equality, damage to nerves, intraoperative fracture, tendon,muscles, arteries or other blood vessels, death and other possible complications from anesthesia. The patient was told that we will take steps to minimize these risks by using sterile technique, antibiotics and DVT prophylaxis when appropriate and follow the patient postoperatively in the office setting to monitor progress. The possibility of recurrent pain, no improvement in pain and actual worsening of pain were also discussed  with the patient. The risk of dislocation following surgery was  discussed and potential precautions to prevent dislocation were reviewed.      The benefits of surgery were discussed with the patient and her son including the potential for improving the patient's current clinical condition through operative intervention. Alternatives to surgical intervention including conservative management were also discussed in detail. All questions were answered to the satisfaction of the patient. The patient and her son participated and agreed to the plan of care as well as the use of the recommended implants for their surgery.    Plan for surgery today 07/26/2023 N.p.o. for the operating room Hold anticoagulation  We will fill out consent in the preoperative holding area with the son present  Reinaldo Berber MD     Reinaldo Berber MD  Beeper #:  [phone]  07/26/2023 8:56 AM

## 2023-07-27 DIAGNOSIS — I2510 Atherosclerotic heart disease of native coronary artery without angina pectoris: Secondary | ICD-10-CM | POA: Diagnosis not present

## 2023-07-27 DIAGNOSIS — I10 Essential (primary) hypertension: Secondary | ICD-10-CM | POA: Diagnosis not present

## 2023-07-27 DIAGNOSIS — E43 Unspecified severe protein-calorie malnutrition: Secondary | ICD-10-CM

## 2023-07-27 DIAGNOSIS — J9601 Acute respiratory failure with hypoxia: Secondary | ICD-10-CM | POA: Diagnosis not present

## 2023-07-27 DIAGNOSIS — S72002A Fracture of unspecified part of neck of left femur, initial encounter for closed fracture: Secondary | ICD-10-CM | POA: Diagnosis not present

## 2023-07-27 LAB — CBC
HCT: 30 % — ABNORMAL LOW (ref 36.0–46.0)
Hemoglobin: 9.6 g/dL — ABNORMAL LOW (ref 12.0–15.0)
MCH: 30.1 pg (ref 26.0–34.0)
MCHC: 32 g/dL (ref 30.0–36.0)
MCV: 94 fL (ref 80.0–100.0)
Platelets: 107 10*3/uL — ABNORMAL LOW (ref 150–400)
RBC: 3.19 MIL/uL — ABNORMAL LOW (ref 3.87–5.11)
RDW: 15.2 % (ref 11.5–15.5)
WBC: 7.7 10*3/uL (ref 4.0–10.5)
nRBC: 0 % (ref 0.0–0.2)

## 2023-07-27 LAB — BASIC METABOLIC PANEL
Anion gap: 8 (ref 5–15)
BUN: 23 mg/dL (ref 8–23)
CO2: 24 mmol/L (ref 22–32)
Calcium: 8.6 mg/dL — ABNORMAL LOW (ref 8.9–10.3)
Chloride: 105 mmol/L (ref 98–111)
Creatinine, Ser: 1.25 mg/dL — ABNORMAL HIGH (ref 0.44–1.00)
GFR, Estimated: 44 mL/min — ABNORMAL LOW (ref >60)
Glucose, Bld: 90 mg/dL (ref 70–99)
Potassium: 5.1 mmol/L (ref 3.5–5.1)
Sodium: 137 mmol/L (ref 135–145)

## 2023-07-27 MED ORDER — CHLORHEXIDINE GLUCONATE CLOTH 2 % EX PADS
6.0000 | MEDICATED_PAD | Freq: Every day | CUTANEOUS | Status: DC
  Administered 2023-07-27 – 2023-07-31 (×5): 6 via TOPICAL

## 2023-07-27 MED ORDER — ENOXAPARIN SODIUM 30 MG/0.3ML IJ SOSY
30.0000 mg | PREFILLED_SYRINGE | INTRAMUSCULAR | Status: DC
  Administered 2023-07-28 – 2023-07-31 (×4): 30 mg via SUBCUTANEOUS
  Filled 2023-07-27 (×4): qty 0.3

## 2023-07-27 MED ORDER — LACTATED RINGERS IV SOLN: INTRAVENOUS | Status: DC

## 2023-07-27 MED ORDER — LACTATED RINGERS IV SOLN: INTRAVENOUS | Status: AC

## 2023-07-27 NOTE — Progress Notes (Signed)
 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  Ms. Spring suffered a fall and was admitted to [facility] 2.28.25 with diagnosis of left hip fracture with planned surgical repair.  Per Dr. Patric Dykes, hospice MD, this is a related hospital admission.   Met with Ms. Books at bedside today and had a nice conversation with her.  She endorses having a lot of pain.  Said PT came to work with her, but she was not able to walk.  She said she doesn't really want to go to [facility]- and wants to go home.  I discussed with her that her going home and not being able to walk and live alone was not currently an option unless she has a 24 hour person with her.  She said we would just have to see how she does.  She was very pleasant to talk to and is oriented with conversations.  She uses her call bell and asked for more pain meds during my visit.   Patient is appropriate for GIP level of care requiring skilled inpatient care for symptom management of pain.   Vital Signs:  BP 125/94, P 82, R 20, Oxi 97% 3L/[place]   Abnormal Labs:  RBC 3.19, HGB  9.6, HCT 30.0, Plat 107, Creat 1.25, Ca 8.6, GFR 44   IV Medications: Morphine 2mg -  x1 Ancef 2g every 6 hours Oxycodone IR 5mg  tab every 4 hours prn   Diagnostics:   No new   Assessment and Plan: Per Dr. Arnetha Courser progress note on 3.1.25   Closed displaced fracture of left femoral neck (HCC) S/p hemiarthroplasty.  Patient tolerated the procedure well, small decrease in hemoglobin to 9.6 after the surgery. Palliative care was consulted as recommended by hospice earlier but they would like hospice to take over her needs. -Continue with supportive care and pain management -Pending PT and OT evaluation   Acute respiratory failure with hypoxia (HCC)  Chest x-ray read as negative.  Currently saturating well on 2 L -Try  weaning oxygen as tolerated   Essential hypertension Continue carvedilol   Coronary artery disease involving native coronary artery without angina pectoris Continue Coreg.  Looks like she does not take aspirin or atorvastatin at home.   Stage 3a chronic kidney disease (HCC) Creatinine today at 1.25. -Monitor renal function -Avoid nephrotoxins   Chronic back pain Looks like she takes oxycodone at home.   Acute urinary retention Unable to urinate before surgery so Foley catheter placed  -Remove Foley catheter to give her a voiding trial   Protein-calorie malnutrition, severe Estimated body mass index is 17.48 kg/m as calculated from the following:   Height as of this encounter: 5\' 3"  (1.6 m).   Weight as of this encounter: 44.8 kg.    -Dietitian consult   Discharge Planning:  ongoing IDT:  Updated Goals of Care:  ongoing and full code Family Contact:     Hospital Liaison Team will continue to follow through final disposition.   Norris Cross, RN Nurse Liaison [phone]

## 2023-07-27 NOTE — Progress Notes (Signed)
 Subjective: 1 Day Post-Op Procedure(s) (LRB): ARTHROPLASTY BIPOLAR HIP (HEMIARTHROPLASTY) (Left) Patient reports pain as mild.   Patient is well, and has had no acute complaints or problems Plan is to go Rehab after hospital stay. Negative for chest pain and shortness of breath Fever: no Gastrointestinal: Negative for nausea and vomiting  Objective: Vital signs in last 24 hours: Temp:  [97.1 F (36.2 C)-98.1 F (36.7 C)] 98 F (36.7 C) (03/01 0407) Pulse Rate:  [71-110] 100 (03/01 0407) Resp:  [12-21] 20 (03/01 0407) BP: (126-175)/(79-100) 132/90 (03/01 0407) SpO2:  [95 %-100 %] 98 % (03/01 0407)  Intake/Output from previous day:  Intake/Output Summary (Last 24 hours) at 07/27/2023 0708 Last data filed at 07/27/2023 0407 Gross per 24 hour  Intake 1300 ml  Output 240 ml  Net 1060 ml    Intake/Output this shift: No intake/output data recorded.  Labs: Recent Labs    07/25/23 2341  HGB 10.5*   Recent Labs    07/25/23 2341  WBC 5.5  RBC 3.44*  HCT 34.1*  PLT 151   Recent Labs    07/25/23 2341  NA 139  K 4.2  CL 103  CO2 27  BUN 16  CREATININE 1.04*  GLUCOSE 96  CALCIUM 9.0   No results for input(s): "LABPT", "INR" in the last 72 hours.   EXAM General - Patient is Alert and Oriented Extremity - Neurovascular intact Sensation intact distally Dorsiflexion/Plantar flexion intact Compartment soft Dressing/Incision - clean, dry, no drainage Motor Function - intact, moving foot and toes well on exam.   Past Medical History:  Diagnosis Date   Altered mental status    Anemia    Anxiety    [name] colitis    Chronic back pain    CKD (chronic kidney disease), stage II    Coronary artery disease    a. 09/2014 NSTEMI/Cath: moderate 2-vessel disease;  b. 09/2016 NSTEMI/Cath: LM 30, LAD 50p, 26m, LCX nl, RCA 35m, 50d, EF 55-65%.   Diverticulitis    Enthesopathy of hip region    Essential hypertension    GERD (gastroesophageal reflux disease)    H/O  Vertebral Fractures    History of colonoscopy    History of echocardiogram    a. 09/2016 Echo: EF 55-60%, no rwma, mildly dil Ao root (3.7cm) and Asc Ao (3.9cm), mild to mod TR.   History of esophagogastroduodenoscopy (EGD)    History of mania    History of sciatica    History of tobacco abuse    Hyperlipidemia    Lumbago    Osteopetrosis    Peripheral neuropathy    Vitamin D deficiency     Assessment/Plan: 1 Day Post-Op Procedure(s) (LRB): ARTHROPLASTY BIPOLAR HIP (HEMIARTHROPLASTY) (Left) Principal Problem:   Closed displaced fracture of left femoral neck (HCC) Active Problems:   Essential hypertension   Coronary artery disease involving native coronary artery without angina pectoris   Stage 3a chronic kidney disease (HCC)   Chronic back pain   Acute urinary retention   Acute respiratory failure with hypoxia (HCC)   Hard of hearing   Protein-calorie malnutrition, severe  Estimated body mass index is 17.48 kg/m as calculated from the following:   Height as of this encounter: 5\' 3"  (1.6 m).   Weight as of this encounter: 44.8 kg. Advance diet Up with therapy Work on bowel movement  Discharge planning.  Follow-up with [facility] clinic orthopedics in 2 weeks for staple removal and x-rays of the left hip.  DVT Prophylaxis -  Lovenox Weight-Bearing as tolerated to left leg  Dedra Skeens, PA-C Orthopaedic Surgery 07/27/2023, 7:08 AM

## 2023-07-27 NOTE — Plan of Care (Signed)
 ?  Problem: Clinical Measurements: ?Goal: Will remain free from infection ?Outcome: Progressing ?  ?

## 2023-07-27 NOTE — Progress Notes (Signed)
  Progress Note   Patient: Cheryl Hall UJW:[id number] DOB: 1941/02/06 DOA: 07/25/2023     1 DOS: the patient was seen and examined on 07/27/2023   Brief hospital course: 83 year old female past medical history of CAD, hypertension, GERD, CKD, chronic back pain.  She is followed by hospice as outpatient.  She had a fall and found to have a left hip fracture.  Patient is very hard of hearing.  2/28.  Dr. Audelia Acton to take to the operating room today.  Patient had a lot of pain this morning.  3/1: Vital stable, s/p left hip cemented hemiarthroplasty.  Hemoglobin decreased to 9.6 postoperatively, platelet of 107, slight worsening of creatinine to 1.25.  Pending PT and OT evaluation. Foley catheter to be removed today to give her a voiding trial.  Assessment and Plan: * Closed displaced fracture of left femoral neck (HCC) S/p hemiarthroplasty.  Patient tolerated the procedure well, small decrease in hemoglobin to 9.6 after the surgery. Palliative care was consulted as recommended by hospice earlier but they would like hospice to take over her needs. -Continue with supportive care and pain management -Pending PT and OT evaluation  Acute respiratory failure with hypoxia (HCC)  Chest x-ray read as negative.  Currently saturating well on 2 L -Try weaning oxygen as tolerated  Essential hypertension Continue carvedilol  Coronary artery disease involving native coronary artery without angina pectoris Continue Coreg.  Looks like she does not take aspirin or atorvastatin at home.  Stage 3a chronic kidney disease (HCC) Creatinine today at 1.25. -Monitor renal function -Avoid nephrotoxins  Chronic back pain Looks like she takes oxycodone at home.  Acute urinary retention Unable to urinate before surgery so Foley catheter placed  -Remove Foley catheter to give her a voiding trial  Protein-calorie malnutrition, severe Estimated body mass index is 17.48 kg/m as calculated from the following:    Height as of this encounter: 5\' 3"  (1.6 m).   Weight as of this encounter: 44.8 kg.   -Dietitian consult  Hard of hearing I had to yell in her ear in order for her to hear me.  Even with me yelling unsure if she is able to comprehend.      Subjective: Patient was seen and examined today.  Quite hard of hearing but denies any significant pain.  Physical Exam: Vitals:   07/26/23 2017 07/27/23 0000 07/27/23 0407 07/27/23 0810  BP: (!) 175/92 126/87 (!) 132/90 (!) 125/94  Pulse: 78 100 100 82  Resp: 20 20 20 20   Temp: 98 F (36.7 C) 98.1 F (36.7 C) 98 F (36.7 C) 98.5 F (36.9 C)  TempSrc:  Oral Oral Oral  SpO2: 97% 99% 98% 97%  Weight:      Height:       General.  Frail and malnourished elderly lady, in no acute distress.  Very hard of hearing Pulmonary.  Lungs clear bilaterally, normal respiratory effort. CV.  Regular rate and rhythm, no JVD, rub or murmur. Abdomen.  Soft, nontender, nondistended, BS positive. CNS.  Alert and oriented .  No focal neurologic deficit. Extremities.  No edema, no cyanosis, pulses intact and symmetrical.    Data Reviewed: Prior data reviewed  Disposition: Status is: Inpatient Remains inpatient appropriate because: Severity of illness. Talked with daughter-in-law on phone.  Planned Discharge Destination: Rehab  DVT prophylaxis.  Lovenox Time spent: 42 minutes  Author: Arnetha Courser, MD 07/27/2023 12:37 PM  For on call review [email or web address].

## 2023-07-27 NOTE — Evaluation (Signed)
 Physical Therapy Evaluation Patient Details Name: Cheryl Hall MRN: [id number] DOB: 11-02-1940 Today's Date: 07/27/2023  History of Present Illness  Cheryl Hall is a 83 y.o. female  with medical history significant of Mild to moderate nonobstructive CAD on [facility] 07/2019, as well as history of hypertension and GERD, CKD 3a, chronic back pain who presented to the emergency room after a mechanical fall and was found to have a displaced left femoral neck fracture. She is POD#1 from Left hip cemented hemiarthroplasty. Pt is WBAT per MD note.  Clinical Impression  83 yo Female presents to hospital s/p fall with subsequent left hip fracture. She is POD#1 left hip hemiarthroplasty. Patient is very hard of hearing making history intake and education difficult. PT initially went in room and patient denied any pain in left hip. However upon trying to sit edge of bed she started crying out in excruciating pain. RN was notified and she medicated patient. PT went in approximately 1 hour later to complete PT evaluation. Patient requires mod A +2 for bed mobility with difficulty achieving neutral upright position (increased posterior lean). She was unable to initiate moving LLE due to hip discomfort requiring assistance for lower extremity management/positioning. Pt stood edge of bed with mod A +1 with RW with flexed posture. She was able to stand for approximately 10-15 sec before sitting back edge of bed. Patient unable to shift weight to take a step towards bedside chair. She was on 4L O2 throughout evaluation. At rest, SPo2 95%, however after standing edge of bed, SPo2 dropped to 88%. It quickly rebounded to 90% with short sitting rest break. Patient would benefit from skilled PT intervention to improve strength, balance and overall mobility.         If plan is discharge home, recommend the following: A lot of help with walking and/or transfers;A lot of help with bathing/dressing/bathroom;Direct supervision/assist for  financial management;Assist for transportation;Help with stairs or ramp for entrance;Direct supervision/assist for medications management;Assistance with cooking/housework   Can travel by private vehicle   No    Equipment Recommendations None recommended by PT  Recommendations for Other Services       Functional Status Assessment Patient has had a recent decline in their functional status and demonstrates the ability to make significant improvements in function in a reasonable and predictable amount of time.     Precautions / Restrictions Precautions Precautions: Fall Precaution/Restrictions Comments: recent fall Restrictions Weight Bearing Restrictions Per Provider Order: Yes LLE Weight Bearing Per Provider Order: Weight bearing as tolerated Other Position/Activity Restrictions: hip abduction wedge pillow when in bed      Mobility  Bed Mobility Overal bed mobility: Needs Assistance Bed Mobility: Supine to Sit, Sit to Supine     Supine to sit: Mod assist, HOB elevated Sit to supine: Mod assist, +2 for physical assistance   General bed mobility comments: with extra time/effort x1 rep; Pt required max A +2 for scooting up in bed    Transfers Overall transfer level: Needs assistance Equipment used: Rolling walker (2 wheels) Transfers: Sit to/from Stand Sit to Stand: Mod assist, From elevated surface           General transfer comment: with cues for hand placement; able to stand for approximately 10-15 sec with flexed posture; unable to shift weight to take a step    Ambulation/Gait               General Gait Details: unable at this time  Stairs  Wheelchair Mobility     Tilt Bed    Modified Rankin (Stroke Patients Only)       Balance Overall balance assessment: Needs assistance Sitting-balance support: Bilateral upper extremity supported, Feet supported Sitting balance-Leahy Scale: Poor Sitting balance - Comments: pt consistently  loses balance posteriorly requiring physical assistance to re-orient to midline Postural control: Posterior lean Standing balance support: Bilateral upper extremity supported, Reliant on assistive device for balance Standing balance-Leahy Scale: Poor Standing balance comment: Requires mod A +1 for sit to stand transfer edge of bed with RW for support; Stands with heavy flexed posture                             Pertinent Vitals/Pain Pain Assessment Pain Assessment: 0-10 Pain Score: 8  Pain Location: left hip Pain Descriptors / Indicators: Aching, Sore Pain Intervention(s): Limited activity within patient's tolerance, Monitored during session, Premedicated before session, Repositioned    Home Living Family/patient expects to be discharged to:: Private residence Living Arrangements: Alone Available Help at Discharge: Family;Available PRN/intermittently (son/daughter in law and neighbor check on patient daily; has home aide 2x a week for assistance with bathing/dressing) Type of Home: House Home Access: Stairs to enter Entrance Stairs-Rails: Right;Left;Can reach both Entrance Stairs-Number of Steps: 4   Home Layout: One level Home Equipment: Agricultural consultant (2 wheels);Cane - single point;Shower seat Additional Comments: Pt reports using walk in shower most of the time with shower seat; Has a home aide that comes on T/Th who assists her with bathing/dressing; Neighbor checks on her daily; Son/daughter in law come by 1-2x a week    Prior Function Prior Level of Function : Needs assist             Mobility Comments: would use RW for ambulation; does report 1 recent fall; not currently driving; does not cook- son or neighbor will bring her food ADLs Comments: has home aide 2x a week to help with bathing/dressing; She reports she can bathe and dress on her own but her son insisted she get a home aide to help some as well     Extremity/Trunk Assessment   Upper Extremity  Assessment Upper Extremity Assessment: Overall WFL for tasks assessed;Generalized weakness    Lower Extremity Assessment Lower Extremity Assessment: RLE deficits/detail;LLE deficits/detail RLE Deficits / Details: ROM is WFL, strength not formally assessed RLE Sensation: WNL LLE Deficits / Details: unable to initiate movement- passive is somewhat functional, very painful to move; LLE: Unable to fully assess due to pain LLE Sensation: WNL    Cervical / Trunk Assessment Cervical / Trunk Assessment: Kyphotic  Communication   Communication Communication: Impaired Factors Affecting Communication: Hearing impaired    Cognition Arousal: Alert Behavior During Therapy: WFL for tasks assessed/performed   PT - Cognitive impairments: Difficult to assess Difficult to assess due to: Hard of hearing/deaf                     PT - Cognition Comments: pt very hard of hearing; able to answer most questions; oriented to situation,         Cueing Cueing Techniques: Verbal cues, Tactile cues     General Comments General comments (skin integrity, edema, etc.): no significant swelling noted; vitals monitored throughout session; Pt's SPo2 95% at rest on 4L O2; after standing dropped to 88% while on 4L with quick rebound to 90%    Exercises Other Exercises Other Exercises: Pt educated on role  of PT and discharge recommendations; She is very hard of hearing and had difficulty understanding therapy recommendations   Assessment/Plan    PT Assessment Patient needs continued PT services  PT Problem List Decreased strength;Pain;Decreased range of motion;Decreased activity tolerance;Decreased balance;Decreased mobility       PT Treatment Interventions DME instruction;Balance training;Gait training;Neuromuscular re-education;Functional mobility training;Patient/family education;Therapeutic activities;Therapeutic exercise    PT Goals (Current goals can be found in the Care Plan section)   Acute Rehab PT Goals Patient Stated Goal: to get better PT Goal Formulation: With patient Time For Goal Achievement: 08/10/23 Potential to Achieve Goals: Fair    Frequency BID     Co-evaluation               AM-PAC PT "6 Clicks" Mobility  Outcome Measure Help needed turning from your back to your side while in a flat bed without using bedrails?: A Lot Help needed moving from lying on your back to sitting on the side of a flat bed without using bedrails?: A Lot Help needed moving to and from a bed to a chair (including a wheelchair)?: Total Help needed standing up from a chair using your arms (e.g., wheelchair or bedside chair)?: A Lot Help needed to walk in hospital room?: Total Help needed climbing 3-5 steps with a railing? : Total 6 Click Score: 9    End of Session Equipment Utilized During Treatment: Gait belt Activity Tolerance: Patient limited by pain Patient left: in bed;with bed alarm set;with call bell/phone within reach Nurse Communication: Mobility status PT Visit Diagnosis: Pain;Muscle weakness (generalized) (M62.81);Other abnormalities of gait and mobility (R26.89) Pain - Right/Left: Left Pain - part of body: Hip    Time: Initially evaluation started 10:38-11; PT left so that RN could provide pain meds; Returned at 12:30 after pain meds given for better tolerance of session;  1230-1300 PT Time Calculation (min) (ACUTE ONLY): 30 min   Charges:   PT Evaluation $PT Eval Moderate Complexity: 1 Mod   PT General Charges $$ ACUTE PT VISIT: 1 Visit          Miriah Maruyama PT, DPT 07/27/2023, 3:27 PM

## 2023-07-27 NOTE — Assessment & Plan Note (Signed)
 Estimated body mass index is 17.48 kg/m as calculated from the following:   Height as of this encounter: 5\' 3"  (1.6 m).   Weight as of this encounter: 44.8 kg.   -Dietitian consult

## 2023-07-27 NOTE — Progress Notes (Signed)
 Physical Therapy Treatment Patient Details Name: Cheryl Hall MRN: [id number] DOB: Mar 27, 1941 Today's Date: 07/27/2023   History of Present Illness Cheryl Hall is a 83 y.o. female  with medical history significant of Mild to moderate nonobstructive CAD on [facility] 07/2019, as well as history of hypertension and GERD, CKD 3a, chronic back pain who presented to the emergency room after a mechanical fall and was found to have a displaced left femoral neck fracture. She is POD#1 from Left hip cemented hemiarthroplasty. Pt is WBAT per MD note.    PT Comments  Pt tolerated session fair. She continues to be hard of hearing requiring repeated instruction. Vitals monitored this afternoon. At rest Spo2 95% on 4L, however after transferring to bedside chair, dropped to 88%, quickly recovered to 90%. Pt demonstrates better weight shift and mobility progressing to min A +1 for bed mobility and transfers. She continues to be limited with left hip pain, however was able to shift weight and take a few steps to bedside chair. Pt would benefit from additional skilled PT intervention to improve LE strength and mobility.     If plan is discharge home, recommend the following: A lot of help with walking and/or transfers;A lot of help with bathing/dressing/bathroom;Direct supervision/assist for financial management;Assist for transportation;Help with stairs or ramp for entrance;Direct supervision/assist for medications management;Assistance with cooking/housework   Can travel by private vehicle     No  Equipment Recommendations  None recommended by PT    Recommendations for Other Services       Precautions / Restrictions Precautions Precautions: Fall Precaution/Restrictions Comments: recent fall Restrictions Weight Bearing Restrictions Per Provider Order: Yes LLE Weight Bearing Per Provider Order: Weight bearing as tolerated Other Position/Activity Restrictions: hip abduction wedge pillow when in bed      Mobility  Bed Mobility Overal bed mobility: Needs Assistance Bed Mobility: Supine to Sit     Supine to sit: HOB elevated, Min assist Sit to supine: Mod assist, +2 for physical assistance   General bed mobility comments: with better initiation of LE movement this session; Does require min A for trunk positioning to scoot edge of bed and right to midline.    Transfers Overall transfer level: Needs assistance Equipment used: Rolling walker (2 wheels) Transfers: Sit to/from Stand Sit to Stand: Mod assist, From elevated surface           General transfer comment: with cues for hand placement;Pt able to shift weight forward for better upright posture/weight bearing this afternoon, x1 rep    Ambulation/Gait Ambulation/Gait assistance: Min assist Gait Distance (Feet): 3 Feet Assistive device: Rolling walker (2 wheels)   Gait velocity: decreased     General Gait Details: able to take 2-3 steps for stand pivot transfer to bedside chair. Required min A for balance and walker management   Stairs             Wheelchair Mobility     Tilt Bed    Modified Rankin (Stroke Patients Only)       Balance Overall balance assessment: Needs assistance Sitting-balance support: Bilateral upper extremity supported, Feet supported Sitting balance-Leahy Scale: Fair Sitting balance - Comments: Able to sit edge of bed without posterior loss of balance this afternoon with hands resting on side of bed. Postural control: Posterior lean Standing balance support: Bilateral upper extremity supported, Reliant on assistive device for balance Standing balance-Leahy Scale: Poor Standing balance comment: Requires min A +1 for sit to stand transfer edge of bed with RW for  support;                            Communication Communication Communication: Impaired Factors Affecting Communication: Hearing impaired  Cognition Arousal: Alert Behavior During Therapy: WFL for tasks  assessed/performed   PT - Cognitive impairments: Difficult to assess Difficult to assess due to: Hard of hearing/deaf                     PT - Cognition Comments: pt very hard of hearing; able to answer most questions; oriented to situation,        Cueing Cueing Techniques: Verbal cues, Tactile cues  Exercises Total Joint Exercises Long Arc Quad: AROM, Both, 10 reps Other Exercises Other Exercises: Pt instructed in seated LAQ. she fatigued quickly with decreased motor control, but able to achieve full ROM x10 reps;    General Comments General comments (skin integrity, edema, etc.): Spo2 monitored throughout session, at rest 95% on 4L o2, after transferring to bedside chair, dropped to 88% while on 4L; quickly recovered to 90% with cues for breathing through nose.      Pertinent Vitals/Pain Pain Assessment Pain Assessment: 0-10 Pain Score: 7  Pain Location: headache Pain Descriptors / Indicators: Headache Pain Intervention(s): Patient requesting pain meds-RN notified    Home Living Family/patient expects to be discharged to:: Private residence Living Arrangements: Alone Available Help at Discharge: Family;Available PRN/intermittently (son/daughter in law and neighbor check on patient daily; has home aide 2x a week for assistance with bathing/dressing) Type of Home: House Home Access: Stairs to enter Entrance Stairs-Rails: Right;Left;Can reach both Entrance Stairs-Number of Steps: 4   Home Layout: One level Home Equipment: Agricultural consultant (2 wheels);Cane - single point;Shower seat Additional Comments: Pt reports using walk in shower most of the time with shower seat; Has a home aide that comes on T/Th who assists her with bathing/dressing; Neighbor checks on her daily; Son/daughter in law come by 1-2x a week    Prior Function            PT Goals (current goals can now be found in the care plan section) Acute Rehab PT Goals Patient Stated Goal: to get better PT  Goal Formulation: With patient Time For Goal Achievement: 08/10/23 Potential to Achieve Goals: Fair Progress towards PT goals: Progressing toward goals    Frequency    BID      PT Plan      Co-evaluation              AM-PAC PT "6 Clicks" Mobility   Outcome Measure  Help needed turning from your back to your side while in a flat bed without using bedrails?: A Lot Help needed moving from lying on your back to sitting on the side of a flat bed without using bedrails?: A Lot Help needed moving to and from a bed to a chair (including a wheelchair)?: Total Help needed standing up from a chair using your arms (e.g., wheelchair or bedside chair)?: A Lot Help needed to walk in hospital room?: A Lot Help needed climbing 3-5 steps with a railing? : Total 6 Click Score: 10    End of Session Equipment Utilized During Treatment: Gait belt Activity Tolerance: Patient limited by pain Patient left: with call bell/phone within reach;in chair;with chair alarm set Nurse Communication: Mobility status PT Visit Diagnosis: Pain;Muscle weakness (generalized) (M62.81);Other abnormalities of gait and mobility (R26.89) Pain - Right/Left: Left Pain - part of body:  Hip     Time: 1540-1605 PT Time Calculation (min) (ACUTE ONLY): 25 min  Charges:    $Therapeutic Exercise: 8-22 mins $Therapeutic Activity: 8-22 mins PT General Charges $$ ACUTE PT VISIT: 1 Visit                     Alane Hanssen PT, DPT 07/27/2023, 5:15 PM

## 2023-07-27 NOTE — Progress Notes (Signed)
 PHARMACIST - PHYSICIAN COMMUNICATION  CONCERNING:  Enoxaparin (Lovenox) for DVT Prophylaxis    RECOMMENDATION: Patient was prescribed enoxaprin 40mg  q24 hours for VTE prophylaxis.   Filed Weights   07/25/23 2206  Weight: 44.8 kg (98 lb 11.2 oz)    Body mass index is 17.48 kg/m.  Estimated Creatinine Clearance: 25.4 mL/min (A) (by C-G formula based on SCr of 1.25 mg/dL (H)).   Based on [facility] policy patient is candidate for enoxaparin 30mg  every 24 hours based on CrCl <9ml/min   DESCRIPTION: Pharmacy has adjusted enoxaparin dose per [facility] policy.  Patient is now receiving enoxaparin 30 mg every 24 hours    Lowella Bandy, PharmD Clinical Pharmacist  07/27/2023 1:18 PM

## 2023-07-27 NOTE — Progress Notes (Signed)
 Communicated with primary team and Renea Ee, [facility] Liaison. Ms. Glacken currently active with [facility]. Hospice liaison will follow along each day through hospitalization. PMT will sign off at this time. Encouraged primary team to engage if plans/goals change.  No Charge.  Leeanne Deed, DNP, AGNP-C Palliative Medicine  Please call Palliative Medicine team phone with any questions [phone]. For individual providers please see AMION.

## 2023-07-28 DIAGNOSIS — S72002A Fracture of unspecified part of neck of left femur, initial encounter for closed fracture: Secondary | ICD-10-CM | POA: Diagnosis not present

## 2023-07-28 LAB — BASIC METABOLIC PANEL
Anion gap: 5 (ref 5–15)
BUN: 30 mg/dL — ABNORMAL HIGH (ref 8–23)
CO2: 27 mmol/L (ref 22–32)
Calcium: 8.4 mg/dL — ABNORMAL LOW (ref 8.9–10.3)
Chloride: 104 mmol/L (ref 98–111)
Creatinine, Ser: 1.08 mg/dL — ABNORMAL HIGH (ref 0.44–1.00)
GFR, Estimated: 52 mL/min — ABNORMAL LOW (ref >60)
Glucose, Bld: 79 mg/dL (ref 70–99)
Potassium: 4.2 mmol/L (ref 3.5–5.1)
Sodium: 136 mmol/L (ref 135–145)

## 2023-07-28 LAB — CBC
HCT: 27.2 % — ABNORMAL LOW (ref 36.0–46.0)
Hemoglobin: 8.6 g/dL — ABNORMAL LOW (ref 12.0–15.0)
MCH: 30.4 pg (ref 26.0–34.0)
MCHC: 31.6 g/dL (ref 30.0–36.0)
MCV: 96.1 fL (ref 80.0–100.0)
Platelets: 98 10*3/uL — ABNORMAL LOW (ref 150–400)
RBC: 2.83 MIL/uL — ABNORMAL LOW (ref 3.87–5.11)
RDW: 15.3 % (ref 11.5–15.5)
WBC: 7.4 10*3/uL (ref 4.0–10.5)
nRBC: 0 % (ref 0.0–0.2)

## 2023-07-28 NOTE — Progress Notes (Signed)
 Nutrition Follow-up  DOCUMENTATION CODES:   Underweight, Severe malnutrition in context of chronic illness  INTERVENTION:  Continue with:  Ensure Enlive po TID, each supplement provides 350 kcal and 20 grams of protein.  -MVI with minerals daily - MD to adjust Bowel regimen do to no BM x 3 days. Magic cup TID with meals, each supplement provides 290 kcal and 9 grams of protein      NUTRITION DIAGNOSIS:   Severe Malnutrition related to social / environmental circumstances as evidenced by severe fat depletion, severe muscle depletion.    GOAL:   Patient will meet greater than or equal to 90% of their needs    MONITOR:   PO intake, Supplement acceptance, Diet advancement  REASON FOR ASSESSMENT:   Consult Assessment of nutrition requirement/status  ASSESSMENT:   Pt with medical history significant of Mild to moderate nonobstructive CAD on [facility] 07/2019, as well as history of hypertension and GERD, CKD 3a, chronic back pain being admitted with left hip fracture after sustaining a mechanical fall.  No current meal documentation. Patient intervention of Ensures appears appropriate at this time.  Nutrition has been following since 07/26/2023. Meds and labs reviewed.  RN reports that she refused breakfast, unsure on lunch %, and not really drinking ensures. No BM since Friday addressed with MD.   Weight history:  07/25/23 44.8 kg  06/03/22 41.5 kg  01/05/22 41.5 kg  09/15/21 44.5 kg  08/26/21 50 kg  07/12/21 47.3 kg  06/21/21 53.8 kg  05/08/21 53.8 kg  05/04/21 54 kg  04/28/21 52.6 kg   NUTRITION - FOCUSED PHYSICAL EXAM:  Flowsheet Row Most Recent Value  Orbital Region Severe depletion  Upper Arm Region Severe depletion  Thoracic and Lumbar Region Severe depletion  Buccal Region Severe depletion  Temple Region Severe depletion  Clavicle Bone Region Severe depletion  Clavicle and Acromion Bone Region Severe depletion  Scapular Bone Region Severe depletion  Dorsal  Hand Severe depletion  Patellar Region Severe depletion  Anterior Thigh Region Severe depletion  Posterior Calf Region Moderate depletion  Edema (RD Assessment) Mild  Hair Reviewed  Eyes Reviewed  Mouth Reviewed  Skin Reviewed  Nails Reviewed       Diet Order:   Diet Order             Diet regular Room service appropriate? Yes; Fluid consistency: Thin  Diet effective now                   EDUCATION NEEDS:   Not appropriate for education at this time  Skin:  Skin Assessment: Reviewed RN Assessment  Last BM:  07/24/23  Height:   Ht Readings from Last 1 Encounters:  07/25/23 5\' 3"  (1.6 m)    Weight:   Wt Readings from Last 1 Encounters:  07/25/23 44.8 kg    Ideal Body Weight:  52.3 kg  BMI:  Body mass index is 17.48 kg/m.  Estimated Nutritional Needs:   Kcal:  1600-1800  Protein:  85-100 grams  Fluid:  > 1.6 L    Jamelle Haring RDN, LDN Clinical Dietitian   If unable to reach, please contact "RD Inpatient" secure chat group between 8 am-4 pm daily"

## 2023-07-28 NOTE — Progress Notes (Signed)
 Physical Therapy Treatment Patient Details Name: Cheryl Hall MRN: [id number] DOB: 04/10/1941 Today's Date: 07/28/2023   History of Present Illness Cheryl Hall is a 83 y.o. female  with medical history significant of Mild to moderate nonobstructive CAD on [facility] 07/2019, as well as history of hypertension and GERD, CKD 3a, chronic back pain who presented to the emergency room after a mechanical fall and was found to have a displaced left femoral neck fracture. She is POD#1 from Left hip cemented hemiarthroplasty. Pt is WBAT per MD note.    PT Comments  Pt ready for session.  Premedicated.  She tolerates limited AAROM in supine prior to mod/max a x 1 to EOB.  She is generally steady in sitting but close supervision is provided for safety.  Stands to RW with mod a x 1 for standing RLE AROM but she is unable to take any steps or pivot to chair with RW assist today.  Pt takes a short seated rest and is able to stand pivot to recliner with mod/max a x 1.  Remained up with needs met after session.   If plan is discharge home, recommend the following: A lot of help with walking and/or transfers;A lot of help with bathing/dressing/bathroom;Direct supervision/assist for financial management;Assist for transportation;Help with stairs or ramp for entrance;Direct supervision/assist for medications management;Assistance with cooking/housework   Can travel by private vehicle        Equipment Recommendations       Recommendations for Other Services       Precautions / Restrictions Precautions Precautions: Fall Precaution/Restrictions Comments: recent fall Restrictions Weight Bearing Restrictions Per Provider Order: Yes LLE Weight Bearing Per Provider Order: Weight bearing as tolerated Other Position/Activity Restrictions: hip abduction wedge pillow when in bed     Mobility  Bed Mobility Overal bed mobility: Needs Assistance Bed Mobility: Supine to Sit     Supine to sit: Mod assist, Max assist, HOB  elevated       Patient Response: Cooperative  Transfers Overall transfer level: Needs assistance Equipment used: Rolling walker (2 wheels), None Transfers: Sit to/from Stand, Bed to chair/wheelchair/BSC Sit to Stand: Mod assist, From elevated surface Stand pivot transfers: Mod assist, Max assist              Ambulation/Gait         Gait velocity: decreased     General Gait Details: unable to take any quality steps today   Stairs             Wheelchair Mobility     Tilt Bed Tilt Bed Patient Response: Cooperative  Modified Rankin (Stroke Patients Only)       Balance Overall balance assessment: Needs assistance Sitting-balance support: Bilateral upper extremity supported, Feet supported Sitting balance-Leahy Scale: Fair Sitting balance - Comments: close supervision provided   Standing balance support: Bilateral upper extremity supported, Reliant on assistive device for balance Standing balance-Leahy Scale: Poor Standing balance comment: +1 hands on assist at all times for support and balance                            Communication    Cognition Arousal: Alert Behavior During Therapy: [facility] for tasks assessed/performed   PT - Cognitive impairments: No apparent impairments                                Cueing  Exercises Other Exercises Other Exercises: standing AROM with walker support    General Comments        Pertinent Vitals/Pain Pain Assessment Pain Assessment: Faces Faces Pain Scale: Hurts even more Pain Location: hip Pain Descriptors / Indicators: Sore, Grimacing Pain Intervention(s): Monitored during session, Premedicated before session, Repositioned, Limited activity within patient's tolerance    Home Living                          Prior Function            PT Goals (current goals can now be found in the care plan section) Progress towards PT goals: Progressing toward goals     Frequency    7X/week      PT Plan      Co-evaluation              AM-PAC PT "6 Clicks" Mobility   Outcome Measure  Help needed turning from your back to your side while in a flat bed without using bedrails?: A Lot Help needed moving from lying on your back to sitting on the side of a flat bed without using bedrails?: A Lot Help needed moving to and from a bed to a chair (including a wheelchair)?: Total Help needed standing up from a chair using your arms (e.g., wheelchair or bedside chair)?: A Lot Help needed to walk in hospital room?: Total Help needed climbing 3-5 steps with a railing? : Total 6 Click Score: 9    End of Session Equipment Utilized During Treatment: Gait belt Activity Tolerance: Patient limited by pain;Patient limited by fatigue Patient left: with call bell/phone within reach;in chair;with chair alarm set Nurse Communication: Mobility status PT Visit Diagnosis: Pain;Muscle weakness (generalized) (M62.81);Other abnormalities of gait and mobility (R26.89) Pain - Right/Left: Left Pain - part of body: Hip     Time: 0102-7253 PT Time Calculation (min) (ACUTE ONLY): 16 min  Charges:    $Therapeutic Exercise: 8-22 mins PT General Charges $$ ACUTE PT VISIT: 1 Visit                   Danielle Dess, PTA 07/28/23, 10:07 AM

## 2023-07-28 NOTE — Progress Notes (Signed)
 Pt had not voided since yesterday post foley removal. The following message was sent to the provider Pt had L hip hemiarthroplasty done. Pt had difficulty voiding at baseline so she had foley prior to her sx. Foley was removed yesterday afternoon.  Her first bladder scan showed volume of 30 so they gave her LR for 20 hrs 100 ml /hr. RN bladder scanned her around 12:30am and it was 186 and again around 4:50 am which showed volume of 354 ml. Pt is distended on L side. There are not very clear orders as to how to proceed. RN tried to do [facility] with her but she was very frail. She did deposit 20ml post her last bladder scan. Please kindly advise.Per on-call provider do bladder scan a few hours later. At this point the volume is not enough.

## 2023-07-28 NOTE — Plan of Care (Signed)

## 2023-07-28 NOTE — Progress Notes (Signed)
 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  Ms. Leas suffered a fall and was admitted to [facility] 2.28.25 with diagnosis of left hip fracture with planned surgical repair.  Per Dr. Patric Dykes, hospice MD, this is a related hospital admission.  Patient awake, talkative and in good mood.  She has been up and worked with PT today.  She was able to bear some weight and take a couple of steps.  Patient will likely need STR.  Decision not made yet.  No family present.  She has dietician consult ordered.     Patient is appropriate for GIP level of care requiring skilled inpatient post op care, requiring PT/OT and pain management.  Vital Signs:  BP 178/92, P 88, R 18, Oxi 97% 3L/Gracey   Abnormal Labs:  BUN 30, Creat 1.08, Ca 8.4, GFR 52, RBC 2.83, HGB 8.6, HCT 27.2, Plat 98,    IV Medications: No IV meds Oxycodone IR 5mg  tab every 4 hours prn   Diagnostics:   No new   Assessment and Plan: Per progress noteDr. Leandro Reasoner 3.2.25   Acute urinary retention Patient failed voiding trial Foley replaced today   Goals of care Protein calorie malnutrition, severe Disposition Difficulty communicating secondary to [facility] Of note patient was seen by hospice nurse who notes that she is under the care of hospice at home Also of note patient is still full code here, I was unable to have a meaningful conversation with her about this She is very hard of hearing and I am not sure she understood what I was saying, this will need to be clarified Continue Ensure, dietary consult is ordered pending   Decreasing H&H Hemoglobin decreased by 1 point since yesterday Hemodynamically stable Repeat H&H in a.m.   Copied and pasted from previous note, continue therapeutic plan as per below    Closed displaced fracture of left femoral neck (HCC) S/p  hemiarthroplasty.  Patient tolerated the procedure well, small decrease in hemoglobin to 9.6 after the surgery. Palliative care was consulted as recommended by hospice earlier but they would like hospice to take over her needs. -Continue with supportive care and pain management -Pending PT and OT evaluation   Acute respiratory failure with hypoxia (HCC)  Chest x-ray read as negative.  Currently saturating well on 2 L -Try weaning oxygen as tolerated   Essential hypertension Continue carvedilol   Coronary artery disease involving native coronary artery without angina pectoris Continue Coreg.  Looks like she does not take aspirin or atorvastatin at home.   Stage 3a chronic kidney disease (HCC) Creatinine today at 1.25. -Monitor renal function -Avoid nephrotoxins   Chronic back pain Looks like she takes oxycodone at home.   Discharge Planning:  ongoing IDT:  Updated Goals of Care:  ongoing and full code Family Contact:  Spoke with patient's son Trey Paula.  Still plan for rehab at d/c.  He understands that patient will need to choose to revoke her hospice benefit if they choose to move forward with rehab.   Hospital Liaison Team will continue to follow through final disposition.   Norris Cross, RN Nurse Liaison [phone]

## 2023-07-28 NOTE — Progress Notes (Signed)
 Subjective: 2 Days Post-Op Procedure(s) (LRB): ARTHROPLASTY BIPOLAR HIP (HEMIARTHROPLASTY) (Left) Patient reports pain as mild.  Complaining of abdominal pain with urinary retention. Patient is well, and has had no acute complaints or problems Plan is to go Rehab after hospital stay. Negative for chest pain and shortness of breath Fever: no Gastrointestinal: Negative for nausea and vomiting  Objective: Vital signs in last 24 hours: Temp:  [97.9 F (36.6 C)-100 F (37.8 C)] 98.3 F (36.8 C) (03/01 2350) Pulse Rate:  [76-82] 80 (03/01 2350) Resp:  [17-20] 17 (03/01 2350) BP: (125-163)/(73-94) 140/82 (03/01 2350) SpO2:  [88 %-100 %] 100 % (03/01 2350)  Intake/Output from previous day:  Intake/Output Summary (Last 24 hours) at 07/28/2023 8469 Last data filed at 07/27/2023 2111 Gross per 24 hour  Intake 217.68 ml  Output 200 ml  Net 17.68 ml    Intake/Output this shift: Total I/O In: 200 [P.O.:200] Out: -   Labs: Recent Labs    07/25/23 2341 07/27/23 0559 07/28/23 0434  HGB 10.5* 9.6* 8.6*   Recent Labs    07/27/23 0559 07/28/23 0434  WBC 7.7 7.4  RBC 3.19* 2.83*  HCT 30.0* 27.2*  PLT 107* 98*   Recent Labs    07/27/23 0559 07/28/23 0434  NA 137 136  K 5.1 4.2  CL 105 104  CO2 24 27  BUN 23 30*  CREATININE 1.25* 1.08*  GLUCOSE 90 79  CALCIUM 8.6* 8.4*   No results for input(s): "LABPT", "INR" in the last 72 hours.   EXAM General - Patient is Alert and Oriented Extremity - Neurovascular intact Sensation intact distally Dorsiflexion/Plantar flexion intact Compartment soft Dressing/Incision - clean, dry, no drainage Motor Function - intact, moving foot and toes well on exam.  Ambulated 3 feet.  Past Medical History:  Diagnosis Date   Altered mental status    Anemia    Anxiety    [name] colitis    Chronic back pain    CKD (chronic kidney disease), stage II    Coronary artery disease    a. 09/2014 NSTEMI/Cath: moderate 2-vessel disease;   b. 09/2016 NSTEMI/Cath: LM 30, LAD 50p, 39m, LCX nl, RCA 35m, 50d, EF 55-65%.   Diverticulitis    Enthesopathy of hip region    Essential hypertension    GERD (gastroesophageal reflux disease)    H/O Vertebral Fractures    History of colonoscopy    History of echocardiogram    a. 09/2016 Echo: EF 55-60%, no rwma, mildly dil Ao root (3.7cm) and Asc Ao (3.9cm), mild to mod TR.   History of esophagogastroduodenoscopy (EGD)    History of mania    History of sciatica    History of tobacco abuse    Hyperlipidemia    Lumbago    Osteopetrosis    Peripheral neuropathy    Vitamin D deficiency     Assessment/Plan: 2 Days Post-Op Procedure(s) (LRB): ARTHROPLASTY BIPOLAR HIP (HEMIARTHROPLASTY) (Left) Principal Problem:   Closed displaced fracture of left femoral neck (HCC) Active Problems:   Essential hypertension   Coronary artery disease involving native coronary artery without angina pectoris   Stage 3a chronic kidney disease (HCC)   Chronic back pain   Acute urinary retention   Acute respiratory failure with hypoxia (HCC)   Hard of hearing   Protein-calorie malnutrition, severe  Estimated body mass index is 17.48 kg/m as calculated from the following:   Height as of this encounter: 5\' 3"  (1.6 m).   Weight as of this encounter: 44.8  kg. Advance diet Up with therapy Work on bowel movement.  And possible In-N-Out catheterization.  Discharge planning.  Follow-up with [facility] clinic orthopedics in 2 weeks for staple removal and x-rays of the left hip.  DVT Prophylaxis - Lovenox Weight-Bearing as tolerated to left leg  Dedra Skeens, PA-C Orthopaedic Surgery 07/28/2023, 6:38 AM

## 2023-07-28 NOTE — Progress Notes (Addendum)
 PROGRESS NOTE    Cheryl Hall  NWG:[id number]  DOB: 20-Jul-1940  DOA: 07/25/2023 PCP: Patrice Paradise, MD Outpatient Specialists:   Hospital course:  83 year old female past medical history of CAD, hypertension, GERD, CKD, chronic back pain.  She is followed by hospice as outpatient.  She had a fall and found to have a left hip fracture.  Patient is very hard of hearing.   2/28.  Dr. Audelia Acton to take to the operating room today.  Patient had a lot of pain this morning.   3/1: Vital stable, s/p left hip cemented hemiarthroplasty.  Hemoglobin decreased to 9.6 postoperatively, platelet of 107, slight worsening of creatinine to 1.25.  Pending PT and OT evaluation. Foley catheter to be removed today to give her a voiding trial.  3/2: Patient without complaints, was sitting up in chair ostensibly talking on the phone however she had difficulty understanding due to [facility].  Patient states she does [facility] that [facility].   Objective: Vitals:   07/27/23 1255 07/27/23 1642 07/27/23 2350 07/28/23 0820  BP:  (!) 163/73 (!) 140/82 (!) 178/92  Pulse:  76 80 88  Resp:  20 17 18   Temp:  97.9 F (36.6 C) 98.3 F (36.8 C) 98.5 F (36.9 C)  TempSrc:  Oral Oral Oral  SpO2: (!) 88% 100% 100% 97%  Weight:      Height:        Intake/Output Summary (Last 24 hours) at 07/28/2023 1403 Last data filed at 07/28/2023 5784 Gross per 24 hour  Intake 217.68 ml  Output 845 ml  Net -627.32 ml   Filed Weights   07/25/23 2206  Weight: 44.8 kg     Exam:  General: Frail elderly female sitting up in recliner apparently talking on the phone Eyes: sclera anicteric, conjuctiva mild injection bilaterally CVS: S1-S2, regular  Respiratory:  decreased air entry bilaterally secondary to decreased inspiratory effort, rales at bases  GI: NABS, soft, NT  LE: Warm and well-perfused  Data Reviewed:  Basic Metabolic Panel: Recent Labs  Lab 07/25/23 2341 07/27/23 0559 07/28/23 0434  NA  139 137 136  K 4.2 5.1 4.2  CL 103 105 104  CO2 27 24 27   GLUCOSE 96 90 79  BUN 16 23 30*  CREATININE 1.04* 1.25* 1.08*  CALCIUM 9.0 8.6* 8.4*    CBC: Recent Labs  Lab 07/25/23 2341 07/27/23 0559 07/28/23 0434  WBC 5.5 7.7 7.4  HGB 10.5* 9.6* 8.6*  HCT 34.1* 30.0* 27.2*  MCV 99.1 94.0 96.1  PLT 151 107* 98*     Scheduled Meds:  carvedilol  3.125 mg Oral BID WC   Chlorhexidine Gluconate Cloth  6 each Topical Daily   docusate sodium  100 mg Oral BID   enoxaparin (LOVENOX) injection  30 mg Subcutaneous Q24H   feeding supplement  237 mL Oral TID BM   multivitamin with minerals  1 tablet Oral Daily   pantoprazole  40 mg Oral Daily   sertraline  50 mg Oral Daily   Continuous Infusions:   Assessment & Plan:    Acute urinary retention Patient failed voiding trial Foley replaced today  Goals of care Protein calorie malnutrition, severe Disposition Difficulty communicating secondary to [facility] Of note patient was seen by hospice nurse who notes that she is under the care of hospice at home Also of note patient is still full code here, I was unable to have a meaningful conversation with her about this She is very  hard of hearing and I am not sure she understood what I was saying, this will need to be clarified Continue Ensure, dietary consult is ordered pending  Decreasing H&H Hemoglobin decreased by 1 point since yesterday Hemodynamically stable Repeat H&H in a.m.  Copied and pasted from previous note, continue therapeutic plan as per below   Closed displaced fracture of left femoral neck (HCC) S/p hemiarthroplasty.  Patient tolerated the procedure well, small decrease in hemoglobin to 9.6 after the surgery. Palliative care was consulted as recommended by hospice earlier but they would like hospice to take over her needs. -Continue with supportive care and pain management -Pending PT and OT evaluation   Acute respiratory failure with hypoxia (HCC)  Chest x-ray  read as negative.  Currently saturating well on 2 L -Try weaning oxygen as tolerated   Essential hypertension Continue carvedilol   Coronary artery disease involving native coronary artery without angina pectoris Continue Coreg.  Looks like she does not take aspirin or atorvastatin at home.   Stage 3a chronic kidney disease (HCC) Creatinine today at 1.25. -Monitor renal function -Avoid nephrotoxins   Chronic back pain Looks like she takes oxycodone at home.      DVT prophylaxis: Lovenox Code Status: Full, this will need to be clarified as patient is on hospice Family Communication: None today     Studies: DG Pelvis Portable Result Date: 07/26/2023 CLINICAL DATA:  Elective surgery.  Left hip hemiarthroplasty. EXAM: PORTABLE PELVIS 1-2 VIEWS COMPARISON:  Preoperative radiograph FINDINGS: Frontal view of the pelvis submitted. Left hip arthroplasty in expected alignment. There is no periprosthetic lucency or fracture. Recent postsurgical change includes air and edema in the soft tissues and joint space. The bones are subjectively under mineralized. IMPRESSION: Left hip arthroplasty. Electronically Signed   By: Narda Rutherford M.D.   On: 07/26/2023 18:36    Principal Problem:   Closed displaced fracture of left femoral neck (HCC) Active Problems:   Acute respiratory failure with hypoxia (HCC)   Essential hypertension   Coronary artery disease involving native coronary artery without angina pectoris   Stage 3a chronic kidney disease (HCC)   Chronic back pain   Acute urinary retention   Hard of hearing   Protein-calorie malnutrition, severe     Remington Highbaugh Tublu Vina Byrd, [facility]  If 7PM-7AM, please contact night-coverage [email or web address]   LOS: 2 days

## 2023-07-29 DIAGNOSIS — I2510 Atherosclerotic heart disease of native coronary artery without angina pectoris: Secondary | ICD-10-CM | POA: Diagnosis not present

## 2023-07-29 DIAGNOSIS — I10 Essential (primary) hypertension: Secondary | ICD-10-CM | POA: Diagnosis not present

## 2023-07-29 DIAGNOSIS — J9601 Acute respiratory failure with hypoxia: Secondary | ICD-10-CM | POA: Diagnosis not present

## 2023-07-29 DIAGNOSIS — S72002A Fracture of unspecified part of neck of left femur, initial encounter for closed fracture: Secondary | ICD-10-CM | POA: Diagnosis not present

## 2023-07-29 LAB — CBC
HCT: 26 % — ABNORMAL LOW (ref 36.0–46.0)
Hemoglobin: 8.2 g/dL — ABNORMAL LOW (ref 12.0–15.0)
MCH: 30.6 pg (ref 26.0–34.0)
MCHC: 31.5 g/dL (ref 30.0–36.0)
MCV: 97 fL (ref 80.0–100.0)
Platelets: 100 10*3/uL — ABNORMAL LOW (ref 150–400)
RBC: 2.68 MIL/uL — ABNORMAL LOW (ref 3.87–5.11)
RDW: 15.1 % (ref 11.5–15.5)
WBC: 5.8 10*3/uL (ref 4.0–10.5)
nRBC: 0 % (ref 0.0–0.2)

## 2023-07-29 LAB — BASIC METABOLIC PANEL
Anion gap: 7 (ref 5–15)
BUN: 30 mg/dL — ABNORMAL HIGH (ref 8–23)
CO2: 27 mmol/L (ref 22–32)
Calcium: 8.4 mg/dL — ABNORMAL LOW (ref 8.9–10.3)
Chloride: 103 mmol/L (ref 98–111)
Creatinine, Ser: 0.88 mg/dL (ref 0.44–1.00)
GFR, Estimated: >60 mL/min (ref >60)
Glucose, Bld: 76 mg/dL (ref 70–99)
Potassium: 4.3 mmol/L (ref 3.5–5.1)
Sodium: 137 mmol/L (ref 135–145)

## 2023-07-29 MED ORDER — ENOXAPARIN SODIUM 30 MG/0.3ML IJ SOSY: 30.0000 mg | PREFILLED_SYRINGE | INTRAMUSCULAR | 0 refills | Status: AC

## 2023-07-29 MED ORDER — OXYCODONE HCL 5 MG PO TABS
5.0000 mg | ORAL_TABLET | ORAL | 0 refills | Status: AC | PRN
Start: 2023-07-29 — End: ?

## 2023-07-29 MED ORDER — FE FUM-VIT C-VIT B12-FA 460-60-0.01-1 MG PO CAPS
1.0000 | ORAL_CAPSULE | Freq: Two times a day (BID) | ORAL | Status: DC
  Administered 2023-07-29 – 2023-07-31 (×4): 1 via ORAL
  Filled 2023-07-29 (×5): qty 1

## 2023-07-29 MED ORDER — LACTULOSE 10 GM/15ML PO SOLN
20.0000 g | Freq: Two times a day (BID) | ORAL | Status: DC
  Administered 2023-07-29 – 2023-07-31 (×3): 20 g via ORAL
  Filled 2023-07-29 (×5): qty 30

## 2023-07-29 NOTE — Progress Notes (Signed)
 Physical Therapy Treatment Patient Details Name: Cheryl Hall MRN: [id number] DOB: [birth year] Today's Date: 07/29/2023   History of Present Illness Cheryl Hall is a 83 y.o. female  with medical history significant of Mild to moderate nonobstructive CAD on [facility] 07/2019, as well as history of hypertension and GERD, CKD 3a, chronic back pain who presented to the emergency room after a mechanical fall and was found to have a displaced left femoral neck fracture. She is POD#1 from Left hip cemented hemiarthroplasty. Pt is WBAT per MD note.    PT Comments  Techs in to provide care for loose BM.  She is transitioned to/from sitting with max a x 1.  Close CGA in sitting.  Transfers to/from [facility] for continued BM and tech assist for pericare.  She opts to return to bed due to fatigue and pain from activity this AM and anticipated continued BM's as she has been given laxatives this morning.  Pt requesting pain medication and relayed to nursing.   If plan is discharge home, recommend the following: A lot of help with walking and/or transfers;A lot of help with bathing/dressing/bathroom;Direct supervision/assist for financial management;Assist for transportation;Help with stairs or ramp for entrance;Direct supervision/assist for medications management;Assistance with cooking/housework   Can travel by private vehicle        Equipment Recommendations  None recommended by PT    Recommendations for Other Services       Precautions / Restrictions Precautions Precautions: Fall Precaution/Restrictions Comments: recent fall Restrictions Weight Bearing Restrictions Per Provider Order: Yes LLE Weight Bearing Per Provider Order: Weight bearing as tolerated Other Position/Activity Restrictions: hip abduction wedge pillow when in bed     Mobility  Bed Mobility Overal bed mobility: Needs Assistance Bed Mobility: Supine to Sit, Sit to Supine     Supine to sit: Mod assist, Max assist, HOB elevated Sit to  supine: Max assist     Patient Response: Cooperative  Transfers Overall transfer level: Needs assistance Equipment used: None Transfers: Bed to chair/wheelchair/BSC   Stand pivot transfers: Mod assist, Max assist         General transfer comment: does bear weight but needs extensive assist to remain standing and pivot to/from [facility]    Ambulation/Gait                   Stairs             Wheelchair Mobility     Tilt Bed Tilt Bed Patient Response: Cooperative  Modified Rankin (Stroke Patients Only)       Balance Overall balance assessment: Needs assistance Sitting-balance support: Bilateral upper extremity supported, Feet supported Sitting balance-Leahy Scale: Fair Sitting balance - Comments: close supervision provided   Standing balance support: Bilateral upper extremity supported, Reliant on assistive device for balance Standing balance-Leahy Scale: Poor Standing balance comment: +1 hands on assist at all times for support and balance                            Communication    Cognition Arousal: Alert Behavior During Therapy: [facility] for tasks assessed/performed   PT - Cognitive impairments: No apparent impairments                                Cueing    Exercises      General Comments        Pertinent Vitals/Pain Pain  Assessment Pain Assessment: Faces Faces Pain Scale: Hurts even more Pain Location: hip Pain Descriptors / Indicators: Sore, Grimacing Pain Intervention(s): Limited activity within patient's tolerance, Monitored during session, Patient requesting pain meds-RN notified    Home Living                          Prior Function            PT Goals (current goals can now be found in the care plan section) Progress towards PT goals: Progressing toward goals    Frequency    7X/week      PT Plan      Co-evaluation              AM-PAC PT "6 Clicks" Mobility   Outcome  Measure  Help needed turning from your back to your side while in a flat bed without using bedrails?: A Lot Help needed moving from lying on your back to sitting on the side of a flat bed without using bedrails?: A Lot Help needed moving to and from a bed to a chair (including a wheelchair)?: Total Help needed standing up from a chair using your arms (e.g., wheelchair or bedside chair)?: A Lot Help needed to walk in hospital room?: Total Help needed climbing 3-5 steps with a railing? : Total 6 Click Score: 9    End of Session Equipment Utilized During Treatment: Gait belt   Patient left: in bed;with call bell/phone within reach;with bed alarm set Nurse Communication: Mobility status PT Visit Diagnosis: Pain;Muscle weakness (generalized) (M62.81);Other abnormalities of gait and mobility (R26.89) Pain - Right/Left: Left Pain - part of body: Hip     Time: 1046-1100 PT Time Calculation (min) (ACUTE ONLY): 14 min  Charges:    $Therapeutic Activity: 8-22 mins PT General Charges $$ ACUTE PT VISIT: 1 Visit                    Danielle Dess, PTA 07/29/23, 11:07 AM

## 2023-07-29 NOTE — [place] FL2 (Signed)
  MEDICAID [id number] LEVEL OF CARE FORM     IDENTIFICATION  Patient Name: Cheryl Hall Birthdate: 1940-09-28 Sex: female Admission Date (Current Location): 07/25/2023  [place] and [place] Number:  Chiropodist and Address:  [facility], [address] [place], [place] [address]      Provider Number: [id number]  Attending Physician Name and Address:  Arnetha Courser, MD  Relative Name and Phone Number:  Frederica Kuster  Son  Emergency Contact  [phone]  [address]  [place] [address]    Current Level of Care: Hospital Recommended Level of Care: Skilled Nursing Facility Prior Approval Number:    Date Approved/Denied:   PASRR Number:    Discharge Plan: SNF    Current Diagnoses: Patient Active Problem List   Diagnosis Date Noted   Closed displaced fracture of left femoral neck (HCC) 07/26/2023   Stage 3a chronic kidney disease (HCC) 07/26/2023   Chronic back pain 07/26/2023   Acute urinary retention 07/26/2023   Acute respiratory failure with hypoxia (HCC) 07/26/2023   Hard of hearing 07/26/2023   Protein-calorie malnutrition, severe 07/26/2023   Swelling of limb 04/28/2021   Delirium 03/20/2021   Diverticulosis 08/19/2020   Anxiety 08/19/2020   Osteoporosis, 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/19/2020   Celiac artery stenosis (HCC) 07/01/2020   Moderate tricuspid regurgitation 10/10/2016   Acute renal insufficiency 10/10/2016   Hyperglycemia 10/10/2016   Thrombocytopenia (HCC) 10/10/2016   CAD in native artery 10/09/2016   Aortic atherosclerosis (HCC) 09/18/2016   Colitis    Lower GI bleed    Acute colitis 08/18/2016   Coronary artery disease involving native coronary artery without angina pectoris 11/15/2014   Hyperlipidemia    Essential hypertension    Tobacco abuse    CKD (chronic kidney disease), stage II    GERD (gastroesophageal reflux disease)    NSTEMI (non-ST elevated myocardial infarction) (HCC) 10/20/2014   Salicylate  poisoning 10/23/2013   Altered mental status 10/16/2013    Orientation RESPIRATION BLADDER Height & Weight     Self, Time, Situation, Place  O2, Normal Continent Weight: 44.8 kg Height:  5\' 3"  (160 cm)  BEHAVIORAL SYMPTOMS/MOOD NEUROLOGICAL BOWEL NUTRITION STATUS      Continent Diet (Regular)  AMBULATORY STATUS COMMUNICATION OF NEEDS Skin   Extensive Assist Verbally Surgical wounds                       Personal Care Assistance Level of Assistance  Bathing, Dressing Bathing Assistance: Maximum assistance   Dressing Assistance: Limited assistance     Functional Limitations Info  Sight, Hearing, Speech Sight Info: Impaired Hearing Info: Impaired Speech Info: Adequate    SPECIAL CARE FACTORS FREQUENCY  PT (By licensed PT), OT (By licensed OT)     PT Frequency: 5 times per week OT Frequency: 5 times per week            Contractures Contractures Info: Not present    Additional Factors Info  Code Status, Allergies Code Status Info: full code Allergies Info: NKDA           Current Medications (07/29/2023):  This is the current hospital active medication list Current Facility-Administered Medications  Medication Dose Route Frequency Provider Last Rate Last Admin   carvedilol (COREG) tablet 3.125 mg  3.125 mg Oral BID WC Andris Baumann, MD   3.125 mg at 07/29/23 2725   Chlorhexidine Gluconate Cloth 2 % PADS 6 each  6 each Topical Daily Wieting, Richard,  MD   6 each at 07/29/23 0934   docusate sodium (COLACE) capsule 100 mg  100 mg Oral BID Reinaldo Berber, MD   100 mg at 07/29/23 0933   enoxaparin (LOVENOX) injection 30 mg  30 mg Subcutaneous Q24H Lowella Bandy, RPH   30 mg at 07/29/23 0933   Fe Fum-Vit C-Vit B12-FA (TRIGELS-F FORTE) capsule 1 capsule  1 capsule Oral BID Arnetha Courser, MD   1 capsule at 07/29/23 1115   feeding supplement (ENSURE ENLIVE / ENSURE PLUS) liquid 237 mL  237 mL Oral TID BM Alford Highland, MD   237 mL at 07/29/23 0934   lactulose  (CHRONULAC) 10 GM/15ML solution 20 g  20 g Oral BID Arnetha Courser, MD   20 g at 07/29/23 6213   menthol-cetylpyridinium (CEPACOL) lozenge 3 mg  1 lozenge Oral PRN Reinaldo Berber, MD       Or   phenol (CHLORASEPTIC) mouth spray 1 spray  1 spray Mouth/Throat PRN Reinaldo Berber, MD       methocarbamol (ROBAXIN) tablet 500 mg  500 mg Oral Q6H PRN Andris Baumann, MD       Or   methocarbamol (ROBAXIN) injection 500 mg  500 mg Intravenous Q6H PRN Andris Baumann, MD       metoCLOPramide (REGLAN) tablet 5-10 mg  5-10 mg Oral Q8H PRN Reinaldo Berber, MD       Or   metoCLOPramide (REGLAN) injection 5-10 mg  5-10 mg Intravenous Q8H PRN Reinaldo Berber, MD       morphine (PF) 2 MG/ML injection 2 mg  2 mg Intravenous Q2H PRN Andris Baumann, MD   2 mg at 07/27/23 1357   multivitamin with minerals tablet 1 tablet  1 tablet Oral Daily Alford Highland, MD   1 tablet at 07/29/23 0934   ondansetron (ZOFRAN) injection 4 mg  4 mg Intravenous Q6H PRN Andris Baumann, MD   4 mg at 07/26/23 1504   oxyCODONE (Oxy IR/ROXICODONE) immediate release tablet 5 mg  5 mg Oral Q4H PRN Andris Baumann, MD   5 mg at 07/29/23 1115   pantoprazole (PROTONIX) EC tablet 40 mg  40 mg Oral Daily Lindajo Royal V, MD   40 mg at 07/29/23 0934   sertraline (ZOLOFT) tablet 50 mg  50 mg Oral Daily Andris Baumann, MD   50 mg at 07/29/23 0865     Discharge Medications: Please see discharge summary for a list of discharge medications.  Relevant Imaging Results:  Relevant Lab Results:   Additional Information #[id number]  Marlowe Sax, RN

## 2023-07-29 NOTE — Progress Notes (Signed)
 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  Ms. Goza suffered a fall and was admitted to [facility] 2.28.25 with diagnosis of left hip fracture with planned surgical repair.  Per Dr. Patric Dykes, hospice MD, this is a related hospital admission.   Patient lying in bed, alert, speaking with hospital staff.  HL went by room several times today, but clinical staff were working with patient.  Spoke with patient's son today.     Patient is appropriate for GIP level of care requiring skilled inpatient post op care, requiring PT/OT and pain management.   Vital Signs:  BP 158/68, P 81, R 16, Oxi 96% 2L/[place]  I&0:  120/1220   Abnormal Labs:  BUN 30, Calcium 8.4, RBC 2.68, Hemoglobin 8.2, HCT 26.0, Platelets 100  IV Medications: No IV meds Oxycodone IR 5mg  tab every 4 hours prn x2   Diagnostics:   No new   Assessment and Plan: Per progress note Dr. Nelson Chimes  3.3.25  Closed displaced fracture of left femoral neck (HCC) S/p hemiarthroplasty.  Patient tolerated the procedure well,  Palliative care was consulted as recommended by hospice earlier but they would like hospice to take over her needs. Hemoglobin continue to decrease slowly -Anemia panel -Continue to monitor -Patient was started on supplement -Continue with supportive care and pain management -PT and OT are recommending SNF   Acute respiratory failure with hypoxia (HCC)  Chest x-ray read as negative.  Currently saturating well on 2 L -Try weaning oxygen as tolerated   Essential hypertension Continue carvedilol   Coronary artery disease involving native coronary artery without angina pectoris Continue Coreg.  Looks like she does not take aspirin or atorvastatin at home.   Stage 3a chronic kidney disease (HCC) Creatinine today at 1.25. -Monitor renal function -Avoid  nephrotoxins   Chronic back pain Looks like she takes oxycodone at home.   Acute urinary retention Unable to urinate before surgery so Foley catheter placed  -Remove Foley catheter to give her a voiding trial   Protein-calorie malnutrition, severe Estimated body mass index is 17.48 kg/m as calculated from the following:   Height as of this encounter: 5\' 3"  (1.6 m).   Weight as of this encounter: 44.8 kg.    -Dietitian consult   Hard of hearing I had to yell in her ear in order for her to hear me.  Even with me yelling unsure if she is able to comprehend.   Discharge Planning:  ongoing IDT:  Updated Goals of Care:  ongoing and full code Family Contact:  Spoke with patient's son today.  He said that patient does not want to go to rehab.  If she changed her mind, he reported, then  she would need to sign the Hospice Medicare Benefit revocation because he does not have any POA documentation.    Waiting for TOC to evaluate for SNF rehab.     Hospital Liaison Team will continue to follow through final disposition.  [facility] Liaison [phone]

## 2023-07-29 NOTE — Progress Notes (Signed)
 Progress Note   Patient: Cheryl Hall WUJ:[id number] DOB: 18-Oct-1940 DOA: 07/25/2023     3 DOS: the patient was seen and examined on 07/29/2023   Brief hospital course: 82 year old female past medical history of CAD, hypertension, GERD, CKD, chronic back pain.  She is followed by hospice as outpatient.  She had a fall and found to have a left hip fracture.  Patient is very hard of hearing.  2/28.  Dr. Audelia Acton to take to the operating room today.  Patient had a lot of pain this morning.  3/1: Vital stable, s/p left hip cemented hemiarthroplasty.  Hemoglobin decreased to 9.6 postoperatively, platelet of 107, slight worsening of creatinine to 1.25.  Pending PT and OT evaluation. Foley catheter to be removed today to give her a voiding trial.  3/3: Hemodynamically stable.  Awaiting SNF placement. Patient failed voiding trial so Foley catheter was replaced-need to see a urologist as outpatient. Orthopedic is recommending Lovenox for 2 weeks on discharge and outpatient follow-up.  Hemoglobin at 8.2  Assessment and Plan: * Closed displaced fracture of left femoral neck (HCC) S/p hemiarthroplasty.  Patient tolerated the procedure well,  Palliative care was consulted as recommended by hospice earlier but they would like hospice to take over her needs. Hemoglobin continue to decrease slowly -Anemia panel -Continue to monitor -Patient was started on supplement -Continue with supportive care and pain management -PT and OT are recommending SNF  Acute respiratory failure with hypoxia (HCC)  Chest x-ray read as negative.  Currently saturating well on 2 L -Try weaning oxygen as tolerated  Essential hypertension Continue carvedilol  Coronary artery disease involving native coronary artery without angina pectoris Continue Coreg.  Looks like she does not take aspirin or atorvastatin at home.  Stage 3a chronic kidney disease (HCC) Creatinine today at 1.25. -Monitor renal function -Avoid  nephrotoxins  Chronic back pain Looks like she takes oxycodone at home.  Acute urinary retention Unable to urinate before surgery so Foley catheter placed  -Remove Foley catheter to give her a voiding trial  Protein-calorie malnutrition, severe Estimated body mass index is 17.48 kg/m as calculated from the following:   Height as of this encounter: 5\' 3"  (1.6 m).   Weight as of this encounter: 44.8 kg.   -Dietitian consult  Hard of hearing I had to yell in her ear in order for her to hear me.  Even with me yelling unsure if she is able to comprehend.      Subjective: Patient was resting comfortably when seen today.  No new concern.  Physical Exam: Vitals:   07/28/23 1536 07/28/23 2131 07/29/23 0829 07/29/23 1251  BP: (!) 140/83 (!) 169/89 (!) 158/68 132/71  Pulse: 81 79 81 71  Resp: 16 20 16 16   Temp: 98.2 F (36.8 C) 99 F (37.2 C) 98.5 F (36.9 C) 98.3 F (36.8 C)  TempSrc:  Oral Oral Oral  SpO2: 100% 97% 96% 95%  Weight:      Height:       General.  Frail and malnourished elderly lady, in no acute distress. Pulmonary.  Lungs clear bilaterally, normal respiratory effort. CV.  Regular rate and rhythm, no JVD, rub or murmur. Abdomen.  Soft, nontender, nondistended, BS positive. CNS.  Alert and oriented .  No focal neurologic deficit. Extremities.  No edema, no cyanosis, pulses intact and symmetrical.     Data Reviewed: Prior data reviewed  Disposition: Status is: Inpatient Remains inpatient appropriate because: Severity of illness.  Planned Discharge Destination: Rehab  DVT prophylaxis.  Lovenox Time spent: 40 minutes  Author: Arnetha Courser, MD 07/29/2023 3:27 PM  For on call review [email or web address].

## 2023-07-29 NOTE — Progress Notes (Signed)
 Subjective: 3 Days Post-Op Procedure(s) (LRB): ARTHROPLASTY BIPOLAR HIP (HEMIARTHROPLASTY) (Left) Patient reports pain as mild.   Patient is well, and has had no acute complaints or problems We will continue therapy today.    Objective: Vital signs in last 24 hours: Temp:  [98.2 F (36.8 C)-99 F (37.2 C)] 98.5 F (36.9 C) (03/03 0829) Pulse Rate:  [79-81] 81 (03/03 0829) Resp:  [16-20] 16 (03/03 0829) BP: (140-169)/(68-89) 158/68 (03/03 0829) SpO2:  [96 %-100 %] 96 % (03/03 0829)  Intake/Output from previous day: 03/02 0701 - 03/03 0700 In: 120 [P.O.:120] Out: 1220 [Urine:1220] Intake/Output this shift: No intake/output data recorded.  Recent Labs    07/27/23 0559 07/28/23 0434 07/29/23 0132  HGB 9.6* 8.6* 8.2*   Recent Labs    07/28/23 0434 07/29/23 0132  WBC 7.4 5.8  RBC 2.83* 2.68*  HCT 27.2* 26.0*  PLT 98* 100*   Recent Labs    07/28/23 0434 07/29/23 0132  NA 136 137  K 4.2 4.3  CL 104 103  CO2 27 27  BUN 30* 30*  CREATININE 1.08* 0.88  GLUCOSE 79 76  CALCIUM 8.4* 8.4*   No results for input(s): "LABPT", "INR" in the last 72 hours.  EXAM General - Patient is Alert, Appropriate, and Oriented Extremity - Neurovascular intact Sensation intact distally Intact pulses distally Dorsiflexion/Plantar flexion intact No cellulitis present Compartment soft Dressing - dressing C/D/I and no drainage Motor Function - intact, moving foot and toes well on exam.   Past Medical History:  Diagnosis Date   Altered mental status    Anemia    Anxiety    [name] colitis    Chronic back pain    CKD (chronic kidney disease), stage II    Coronary artery disease    a. 09/2014 NSTEMI/Cath: moderate 2-vessel disease;  b. 09/2016 NSTEMI/Cath: LM 30, LAD 50p, 26m, LCX nl, RCA 19m, 50d, EF 55-65%.   Diverticulitis    Enthesopathy of hip region    Essential hypertension    GERD (gastroesophageal reflux disease)    H/O Vertebral Fractures    History of  colonoscopy    History of echocardiogram    a. 09/2016 Echo: EF 55-60%, no rwma, mildly dil Ao root (3.7cm) and Asc Ao (3.9cm), mild to mod TR.   History of esophagogastroduodenoscopy (EGD)    History of mania    History of sciatica    History of tobacco abuse    Hyperlipidemia    Lumbago    Osteopetrosis    Peripheral neuropathy    Vitamin D deficiency     Assessment/Plan:   3 Days Post-Op Procedure(s) (LRB): ARTHROPLASTY BIPOLAR HIP (HEMIARTHROPLASTY) (Left) Principal Problem:   Closed displaced fracture of left femoral neck (HCC) Active Problems:   Essential hypertension   Coronary artery disease involving native coronary artery without angina pectoris   Stage 3a chronic kidney disease (HCC)   Chronic back pain   Acute urinary retention   Acute respiratory failure with hypoxia (HCC)   Hard of hearing   Protein-calorie malnutrition, severe  Estimated body mass index is 17.48 kg/m as calculated from the following:   Height as of this encounter: 5\' 3"  (1.6 m).   Weight as of this encounter: 44.8 kg. Advance diet Up with therapy Pain well controlled VSS Labs are stable, Hgb 8.2 trending down. Continue to monitor. Recheck Hgb in the am CM to assist with discharge  Follow up with [facility] in 2 weeks Lovenox 30 mg SQ daily  x 14 days at discharge  DVT Prophylaxis - Lovenox Weight-Bearing as tolerated to left leg   T. Cranston Neighbor, PA-C [facility] 07/29/2023, 10:01 AM

## 2023-07-29 NOTE — Care Management Important Message (Signed)
 Important Message  Patient Details  Name: Cheryl Hall MRN: [id number] Date of Birth: 1940-09-01   Important Message Given:  Yes - Medicare IM     Cristela Blue, CMA 07/29/2023, 10:41 AM

## 2023-07-30 ENCOUNTER — Encounter: Payer: Self-pay | Admitting: Orthopedic Surgery

## 2023-07-30 DIAGNOSIS — I2510 Atherosclerotic heart disease of native coronary artery without angina pectoris: Secondary | ICD-10-CM | POA: Diagnosis not present

## 2023-07-30 DIAGNOSIS — J9601 Acute respiratory failure with hypoxia: Secondary | ICD-10-CM | POA: Diagnosis not present

## 2023-07-30 DIAGNOSIS — S72002A Fracture of unspecified part of neck of left femur, initial encounter for closed fracture: Secondary | ICD-10-CM | POA: Diagnosis not present

## 2023-07-30 DIAGNOSIS — I10 Essential (primary) hypertension: Secondary | ICD-10-CM | POA: Diagnosis not present

## 2023-07-30 LAB — FERRITIN: Ferritin: 15 ng/mL (ref 11–307)

## 2023-07-30 LAB — CBC
HCT: 26.6 % — ABNORMAL LOW (ref 36.0–46.0)
Hemoglobin: 8.4 g/dL — ABNORMAL LOW (ref 12.0–15.0)
MCH: 30.1 pg (ref 26.0–34.0)
MCHC: 31.6 g/dL (ref 30.0–36.0)
MCV: 95.3 fL (ref 80.0–100.0)
Platelets: 115 10*3/uL — ABNORMAL LOW (ref 150–400)
RBC: 2.79 MIL/uL — ABNORMAL LOW (ref 3.87–5.11)
RDW: 14.8 % (ref 11.5–15.5)
WBC: 5 10*3/uL (ref 4.0–10.5)
nRBC: 0 % (ref 0.0–0.2)

## 2023-07-30 LAB — IRON AND TIBC
Iron: 26 ug/dL — ABNORMAL LOW (ref 28–170)
Saturation Ratios: 11 % (ref 10.4–31.8)
TIBC: 228 ug/dL — ABNORMAL LOW (ref 250–450)
UIBC: 202 ug/dL

## 2023-07-30 LAB — RETICULOCYTES
Immature Retic Fract: 20.3 % — ABNORMAL HIGH (ref 2.3–15.9)
RBC.: 2.73 MIL/uL — ABNORMAL LOW (ref 3.87–5.11)
Retic Count, Absolute: 58.1 10*3/uL (ref 19.0–186.0)
Retic Ct Pct: 2.1 % (ref 0.4–3.1)

## 2023-07-30 LAB — VITAMIN B12: Vitamin B-12: 669 pg/mL (ref 180–914)

## 2023-07-30 LAB — FOLATE: Folate: 28 ng/mL (ref >5.9)

## 2023-07-30 MED ORDER — CARVEDILOL 3.125 MG PO TABS
6.2500 mg | ORAL_TABLET | Freq: Two times a day (BID) | ORAL | Status: DC
  Administered 2023-07-30 – 2023-07-31 (×2): 6.25 mg via ORAL
  Filled 2023-07-30 (×2): qty 2

## 2023-07-30 MED ORDER — LOSARTAN POTASSIUM 25 MG PO TABS
25.0000 mg | ORAL_TABLET | Freq: Every day | ORAL | Status: DC
  Administered 2023-07-30 – 2023-07-31 (×2): 25 mg via ORAL
  Filled 2023-07-30 (×2): qty 1

## 2023-07-30 NOTE — Plan of Care (Signed)
 ?  Problem: Clinical Measurements: ?Goal: Will remain free from infection ?Outcome: Progressing ?  ?

## 2023-07-30 NOTE — TOC Progression Note (Signed)
 Transition of Care [facility]) - Progression Note    Patient Details  Name: Cheryl Hall MRN: [id number] Date of Birth: 1940/11/27  [facility]) CM/SW Contact  Marlowe Sax, RN Phone Number: 07/30/2023, 2:40 PM  Clinical Narrative:     Met with the patient at the bedside along with Misti from Hospice, Had a conversation aout going to STR prior to going home, she lives alone with her cat and reports that her son works all the time, she is agreeable to going to STR and chose [facility] when reviewing the bed offers, I notified Tammy at [facility]  Expected Discharge Plan: Skilled Nursing Facility Barriers to Discharge: SNF Pending bed offer, Insurance Authorization  Expected Discharge Plan and Services   Discharge Planning Services: CM Consult   Living arrangements for the past 2 months: Single Family Home                   DME Agency: NA       HH Arranged: NA           Social Determinants of Health (SDOH) Interventions SDOH Screenings   Food Insecurity: No Food Insecurity (07/26/2023)  Housing: Unknown (07/26/2023)  Transportation Needs: Patient Declined (07/26/2023)  Utilities: Patient Unable To Answer (07/26/2023)  Financial Resource Strain: Low Risk  (03/16/2021)   Received from [facility], [facility]  Social Connections: Unknown (07/26/2023)  Tobacco Use: High Risk (07/26/2023)    Readmission Risk Interventions     No data to display

## 2023-07-30 NOTE — Progress Notes (Addendum)
 Physical Therapy Treatment Patient Details Name: Cheryl Hall MRN: [id number] DOB: 21-Sep-1940 Today's Date: 07/30/2023   History of Present Illness Cheryl Hall is a 83 y.o. female  with medical history significant of Mild to moderate nonobstructive CAD on [facility] 07/2019, as well as history of hypertension and GERD, CKD 3a, chronic back pain who presented to the emergency room after a mechanical fall and was found to have a displaced left femoral neck fracture. She is POD#1 from Left hip cemented hemiarthroplasty. Pt is WBAT per MD note.    PT Comments  Pt willing to participate despite nausea and generally feeling poorly.  Supine AAROM LLE.  To EOB with max a x 1.  She sits with supervision but leans head on walker and arm due to malaise and nausea.  She is able to stand to RW with mod a x 1 with overall improved standing quality today.  Once sitting she stated she needed to lay back down.  "I don't think I can sit in the chair right now".  She is assisted back to bed.  RN in room for meds and aware pt requesting nausea meds.  Given emesis bag to use if needed.       If plan is discharge home, recommend the following: A lot of help with walking and/or transfers;A lot of help with bathing/dressing/bathroom;Direct supervision/assist for financial management;Assist for transportation;Help with stairs or ramp for entrance;Direct supervision/assist for medications management;Assistance with cooking/housework   Can travel by private vehicle        Equipment Recommendations  Rolling walker (2 wheels);BSC/3in1;Wheelchair (measurements PT);Wheelchair cushion (measurements PT);Hospital bed    Recommendations for Other Services       Precautions / Restrictions Precautions Precautions: Fall Precaution/Restrictions Comments: recent fall Restrictions Weight Bearing Restrictions Per Provider Order: Yes LLE Weight Bearing Per Provider Order: Weight bearing as tolerated Other Position/Activity Restrictions:  hip abduction wedge pillow when in bed     Mobility  Bed Mobility Overal bed mobility: Needs Assistance Bed Mobility: Supine to Sit, Sit to Supine     Supine to sit: Mod assist, Max assist, HOB elevated Sit to supine: Max assist     Patient Response: Cooperative  Transfers Overall transfer level: Needs assistance Equipment used: Rolling walker (2 wheels) Transfers: Sit to/from Stand Sit to Stand: Mod assist, From elevated surface           General transfer comment: overall improved standing quality today but limited by nausea and general malaise    Ambulation/Gait                   Stairs             Wheelchair Mobility     Tilt Bed Tilt Bed Patient Response: Cooperative  Modified Rankin (Stroke Patients Only)       Balance Overall balance assessment: Needs assistance Sitting-balance support: Bilateral upper extremity supported, Feet supported Sitting balance-Leahy Scale: Fair Sitting balance - Comments: close supervision provided, pt leaning head on walker due to malaise and nausea but no LOB notes   Standing balance support: Bilateral upper extremity supported, Reliant on assistive device for balance Standing balance-Leahy Scale: Poor Standing balance comment: +1 hands on assist at all times for support and balance                            Communication Communication Communication: Impaired Factors Affecting Communication: Hearing impaired  Cognition Arousal: Alert Behavior During  Therapy: WFL for tasks assessed/performed   PT - Cognitive impairments: No apparent impairments                       PT - Cognition Comments: pt very hard of hearing; able to answer most questions; oriented to situation,        Cueing Cueing Techniques: Verbal cues, Tactile cues  Exercises Other Exercises Other Exercises: supine AAROM LLE prior to mobility    General Comments        Pertinent Vitals/Pain Pain  Assessment Pain Assessment: Faces Faces Pain Scale: Hurts even more Pain Location: hip Pain Descriptors / Indicators: Sore, Grimacing Pain Intervention(s): Limited activity within patient's tolerance, Monitored during session, Repositioned    Home Living                          Prior Function            PT Goals (current goals can now be found in the care plan section) Progress towards PT goals: Not progressing toward goals - comment    Frequency    7X/week      PT Plan      Co-evaluation              AM-PAC PT "6 Clicks" Mobility   Outcome Measure  Help needed turning from your back to your side while in a flat bed without using bedrails?: A Lot Help needed moving from lying on your back to sitting on the side of a flat bed without using bedrails?: A Lot Help needed moving to and from a bed to a chair (including a wheelchair)?: Total Help needed standing up from a chair using your arms (e.g., wheelchair or bedside chair)?: A Lot Help needed to walk in hospital room?: Total Help needed climbing 3-5 steps with a railing? : Total 6 Click Score: 9    End of Session Equipment Utilized During Treatment: Oxygen Activity Tolerance: Patient limited by pain;Patient limited by fatigue Patient left: in bed;with call bell/phone within reach;with bed alarm set;with nursing/sitter in room Nurse Communication: Mobility status PT Visit Diagnosis: Pain;Muscle weakness (generalized) (M62.81);Other abnormalities of gait and mobility (R26.89) Pain - Right/Left: Left Pain - part of body: Hip     Time: 7829-5621 PT Time Calculation (min) (ACUTE ONLY): 14 min  Charges:    $Therapeutic Exercise: 8-22 mins PT General Charges $$ ACUTE PT VISIT: 1 Visit                  Danielle Dess, PTA 07/30/23, 10:52 AM

## 2023-07-30 NOTE — TOC Progression Note (Signed)
 Transition of Care [facility]) - Progression Note    Patient Details  Name: Cheryl Hall MRN: [id number] Date of Birth: [birth year]  [facility]) CM/SW Contact  Marlowe Sax, RN Phone Number: 07/30/2023, 9:28 AM  Clinical Narrative:    [name] [phone]   Expected Discharge Plan: Skilled Nursing Facility Barriers to Discharge: SNF Pending bed offer, Insurance Authorization  Expected Discharge Plan and Services   Discharge Planning Services: CM Consult   Living arrangements for the past 2 months: Single Family Home                   DME Agency: NA       HH Arranged: NA           Social Determinants of Health (SDOH) Interventions SDOH Screenings   Food Insecurity: No Food Insecurity (07/26/2023)  Housing: Unknown (07/26/2023)  Transportation Needs: Patient Declined (07/26/2023)  Utilities: Patient Unable To Answer (07/26/2023)  Financial Resource Strain: Low Risk  (03/16/2021)   Received from [facility], [facility]  Social Connections: Unknown (07/26/2023)  Tobacco Use: High Risk (07/26/2023)    Readmission Risk Interventions     No data to display

## 2023-07-30 NOTE — Progress Notes (Signed)
 Progress Note   Patient: Cheryl Hall MVH:[id number] DOB: 06-29-1940 DOA: 07/25/2023     4 DOS: the patient was seen and examined on 07/30/2023   Brief hospital course: 83 year old female past medical history of CAD, hypertension, GERD, CKD, chronic back pain.  She is followed by hospice as outpatient.  She had a fall and found to have a left hip fracture.  Patient is very hard of hearing.  2/28.  Dr. Audelia Acton to take to the operating room today.  Patient had a lot of pain this morning.  3/1: Vital stable, s/p left hip cemented hemiarthroplasty.  Hemoglobin decreased to 9.6 postoperatively, platelet of 107, slight worsening of creatinine to 1.25.  Pending PT and OT evaluation. Foley catheter to be removed today to give her a voiding trial.  3/3: Hemodynamically stable.  Awaiting SNF placement. Patient failed voiding trial so Foley catheter was replaced-need to see a urologist as outpatient. Orthopedic is recommending Lovenox for 2 weeks on discharge and outpatient follow-up.  Hemoglobin at 8.2  3/4: Hemodynamically stable, blood pressure elevated.  Hemoglobin stable with small improvement to 8.4.  Anemia panel consistent with anemia of chronic disease with mild iron deficiency.  Increasing the dose of Coreg and also added low-dose losartan.  Assessment and Plan: * Closed displaced fracture of left femoral neck (HCC) S/p hemiarthroplasty.  Patient tolerated the procedure well,  Palliative care was consulted as recommended by hospice earlier but they would like hospice to take over her needs. Hemoglobin now stable at 8.4, anemia panel consistent with anemia of chronic disease and mild iron deficiency -Continue to monitor -Patient was started on supplement -Continue with supportive care and pain management -PT and OT are recommending SNF  Acute respiratory failure with hypoxia (HCC)  Chest x-ray read as negative.  Currently saturating well on 2 L -Try weaning oxygen as tolerated  Essential  hypertension Blood pressure elevated. -Increasing the dose of Coreg to 6.25 mg twice daily -Add low-dose losartan -Continue to monitor  Coronary artery disease involving native coronary artery without angina pectoris Continue Coreg.  Looks like she does not take aspirin or atorvastatin at home.  Stage 3a chronic kidney disease (HCC) Creatinine today at 1.25. -Monitor renal function -Avoid nephrotoxins  Chronic back pain Looks like she takes oxycodone at home.  Acute urinary retention Unable to urinate before surgery so Foley catheter placed  -Remove Foley catheter to give her a voiding trial  Protein-calorie malnutrition, severe Estimated body mass index is 17.48 kg/m as calculated from the following:   Height as of this encounter: 5\' 3"  (1.6 m).   Weight as of this encounter: 44.8 kg.   -Dietitian consult  Hard of hearing I had to yell in her ear in order for her to hear me.  Even with me yelling unsure if she is able to comprehend.      Subjective: Patient was having mild nausea when seen today.  No vomiting.  She was upset that when she complained about mild nausea that took away her breakfast tray.  She was feeling hungry.  Physical Exam: Vitals:   07/29/23 1251 07/29/23 1545 07/30/23 0002 07/30/23 0757  BP: 132/71 (!) 155/84 (!) 157/78 (!) 164/94  Pulse: 71 88 77 74  Resp: 16 16 18 16   Temp: 98.3 F (36.8 C) 98.5 F (36.9 C) 98.1 F (36.7 C) (!) 97.4 F (36.3 C)  TempSrc: Oral Oral  Oral  SpO2: 95% 90% 96% 99%  Weight:      Height:  General.  Frail and malnourished elderly lady, in no acute distress. Pulmonary.  Lungs clear bilaterally, normal respiratory effort. CV.  Regular rate and rhythm, no JVD, rub or murmur. Abdomen.  Soft, nontender, nondistended, BS positive. CNS.  Alert and oriented .  No focal neurologic deficit. Extremities.  No edema, no cyanosis, pulses intact and symmetrical.    Data Reviewed: Prior data  reviewed  Disposition: Status is: Inpatient Remains inpatient appropriate because: Severity of illness.  Planned Discharge Destination: SNF  DVT prophylaxis.  Lovenox Time spent: 39 minutes  Author: Arnetha Courser, MD 07/30/2023 2:12 PM  For on call review [email or web address].

## 2023-07-30 NOTE — Progress Notes (Signed)
 Subjective: 4 Days Post-Op Procedure(s) (LRB): HEMIARTHROPLASTY, HIP, DIRECT ANTERIOR APPROACH, FOR FRACTURE (Left) Patient reports pain as mild.   Patient is well, and has had no acute complaints or problems We will continue therapy today.    Objective: Vital signs in last 24 hours: Temp:  [97.4 F (36.3 C)-98.5 F (36.9 C)] 97.4 F (36.3 C) (03/04 0757) Pulse Rate:  [71-88] 74 (03/04 0757) Resp:  [16-18] 16 (03/04 0757) BP: (132-164)/(71-94) 164/94 (03/04 0757) SpO2:  [90 %-99 %] 99 % (03/04 0757)  Intake/Output from previous day: 03/03 0701 - 03/04 0700 In: -  Out: 450 [Urine:450] Intake/Output this shift: No intake/output data recorded.  Recent Labs    07/28/23 0434 07/29/23 0132 07/30/23 0505  HGB 8.6* 8.2* 8.4*   Recent Labs    07/29/23 0132 07/30/23 0505  WBC 5.8 5.0  RBC 2.68* 2.79*  2.73*  HCT 26.0* 26.6*  PLT 100* 115*   Recent Labs    07/28/23 0434 07/29/23 0132  NA 136 137  K 4.2 4.3  CL 104 103  CO2 27 27  BUN 30* 30*  CREATININE 1.08* 0.88  GLUCOSE 79 76  CALCIUM 8.4* 8.4*   No results for input(s): "LABPT", "INR" in the last 72 hours.  EXAM General - Patient is Alert, Appropriate, and Oriented Extremity - Neurovascular intact Sensation intact distally Intact pulses distally Dorsiflexion/Plantar flexion intact No cellulitis present Compartment soft Dressing - dressing C/D/I and no drainage Motor Function - intact, moving foot and toes well on exam.   Past Medical History:  Diagnosis Date   Altered mental status    Anemia    Anxiety    [name] colitis    Chronic back pain    CKD (chronic kidney disease), stage II    Coronary artery disease    a. 09/2014 NSTEMI/Cath: moderate 2-vessel disease;  b. 09/2016 NSTEMI/Cath: LM 30, LAD 50p, 60m, LCX nl, RCA 48m, 50d, EF 55-65%.   Diverticulitis    Enthesopathy of hip region    Essential hypertension    GERD (gastroesophageal reflux disease)    H/O Vertebral Fractures     History of colonoscopy    History of echocardiogram    a. 09/2016 Echo: EF 55-60%, no rwma, mildly dil Ao root (3.7cm) and Asc Ao (3.9cm), mild to mod TR.   History of esophagogastroduodenoscopy (EGD)    History of mania    History of sciatica    History of tobacco abuse    Hyperlipidemia    Lumbago    Osteopetrosis    Peripheral neuropathy    Vitamin D deficiency     Assessment/Plan:   4 Days Post-Op Procedure(s) (LRB): HEMIARTHROPLASTY, HIP, DIRECT ANTERIOR APPROACH, FOR FRACTURE (Left) Principal Problem:   Closed displaced fracture of left femoral neck (HCC) Active Problems:   Essential hypertension   Coronary artery disease involving native coronary artery without angina pectoris   Stage 3a chronic kidney disease (HCC)   Chronic back pain   Acute urinary retention   Acute respiratory failure with hypoxia (HCC)   Hard of hearing   Protein-calorie malnutrition, severe  Estimated body mass index is 17.48 kg/m as calculated from the following:   Height as of this encounter: 5\' 3"  (1.6 m).   Weight as of this encounter: 44.8 kg. Advance diet Up with therapy Pain well controlled VSS Labs are stable, Hgb 8.4 trending up. stable CM to assist with discharge  Follow up with [facility] in 2 weeks Lovenox 30 mg SQ  daily x 14 days at discharge  DVT Prophylaxis - Lovenox Weight-Bearing as tolerated to left leg   T. Cranston Neighbor, PA-C [facility] 07/30/2023, 11:50 AM

## 2023-07-30 NOTE — Progress Notes (Signed)
 [facility] liaison note:   Patient revoked hospice services today in order to seek skilled rehab.   An outpatient palliative referral will be initiated per request of the family.   Thank you for the opportunity to participate in this patient's plan of care.  Thea Gist, BSN, [facility] liaison note [phone]

## 2023-07-30 NOTE — Plan of Care (Signed)
  Problem: Education: Goal: Knowledge of General Education information will improve Description: Including pain rating scale, medication(s)/side effects and non-pharmacologic comfort measures Outcome: Progressing   Problem: Nutrition: Goal: Adequate nutrition will be maintained Outcome: Progressing   Problem: Elimination: Goal: Will not experience complications related to bowel motility Outcome: Progressing   Problem: Pain Management: Goal: Pain level will decrease Outcome: Progressing

## 2023-07-31 DIAGNOSIS — I2510 Atherosclerotic heart disease of native coronary artery without angina pectoris: Secondary | ICD-10-CM | POA: Diagnosis not present

## 2023-07-31 DIAGNOSIS — J9601 Acute respiratory failure with hypoxia: Secondary | ICD-10-CM | POA: Diagnosis not present

## 2023-07-31 DIAGNOSIS — M545 Low back pain, unspecified: Secondary | ICD-10-CM

## 2023-07-31 DIAGNOSIS — S72002A Fracture of unspecified part of neck of left femur, initial encounter for closed fracture: Secondary | ICD-10-CM | POA: Diagnosis not present

## 2023-07-31 DIAGNOSIS — I10 Essential (primary) hypertension: Secondary | ICD-10-CM | POA: Diagnosis not present

## 2023-07-31 MED ORDER — FE FUM-VIT C-VIT B12-FA 460-60-0.01-1 MG PO CAPS: 1.0000 | ORAL_CAPSULE | Freq: Two times a day (BID) | ORAL | Status: AC

## 2023-07-31 MED ORDER — LOSARTAN POTASSIUM 25 MG PO TABS: 25.0000 mg | ORAL_TABLET | Freq: Every day | ORAL | Status: AC

## 2023-07-31 MED ORDER — LACTULOSE 10 GM/15ML PO SOLN: 20.0000 g | Freq: Two times a day (BID) | ORAL | Status: AC

## 2023-07-31 MED ORDER — ENSURE ENLIVE PO LIQD: 237.0000 mL | Freq: Three times a day (TID) | ORAL | Status: AC

## 2023-07-31 MED ORDER — CARVEDILOL 6.25 MG PO TABS: 6.2500 mg | ORAL_TABLET | Freq: Two times a day (BID) | ORAL | Status: AC

## 2023-07-31 MED ORDER — ADULT MULTIVITAMIN W/MINERALS CH: 1.0000 | ORAL_TABLET | Freq: Every day | ORAL | Status: AC

## 2023-07-31 NOTE — TOC Progression Note (Addendum)
 Transition of Care [facility]) - Progression Note    Patient Details  Name: Cheryl Hall MRN: [id number] Date of Birth: 1940-08-10  [facility]) CM/SW Contact  Marlowe Sax, RN Phone Number: 07/31/2023, 11:58 AM  Clinical Narrative:    Spoke with Fayrene Fearing the son, and notified him of the bed number 709 at [facility], Will need EMS to go to [facility], [facility] called  [id number] medicare number  Expected Discharge Plan: Skilled Nursing Facility Barriers to Discharge: SNF Pending bed offer, Insurance Authorization  Expected Discharge Plan and Services   Discharge Planning Services: CM Consult   Living arrangements for the past 2 months: Single Family Home Expected Discharge Date: 07/31/23                 DME Agency: NA       HH Arranged: NA           Social Determinants of Health (SDOH) Interventions SDOH Screenings   Food Insecurity: No Food Insecurity (07/26/2023)  Housing: Unknown (07/26/2023)  Transportation Needs: Patient Declined (07/26/2023)  Utilities: Patient Unable To Answer (07/26/2023)  Financial Resource Strain: Low Risk  (03/16/2021)   Received from [facility], [facility]  Social Connections: Unknown (07/26/2023)  Tobacco Use: High Risk (07/26/2023)    Readmission Risk Interventions     No data to display

## 2023-07-31 NOTE — TOC Progression Note (Signed)
 Transition of Care [facility]) - Progression Note    Patient Details  Name: Cheryl Hall MRN: [id number] Date of Birth: [birth year]  Transition of Care [facility]) CM/SW Contact  Marlowe Sax, RN Phone Number: 07/31/2023, 9:42 AM  Clinical Narrative:     Called and left a secure VM for Gina at [facility] asking if the patient can come there today, waiting on a response  Expected Discharge Plan: Skilled Nursing Facility Barriers to Discharge: SNF Pending bed offer, Insurance Authorization  Expected Discharge Plan and Services   Discharge Planning Services: CM Consult   Living arrangements for the past 2 months: Single Family Home                   DME Agency: NA       HH Arranged: NA           Social Determinants of Health (SDOH) Interventions SDOH Screenings   Food Insecurity: No Food Insecurity (07/26/2023)  Housing: Unknown (07/26/2023)  Transportation Needs: Patient Declined (07/26/2023)  Utilities: Patient Unable To Answer (07/26/2023)  Financial Resource Strain: Low Risk  (03/16/2021)   Received from [facility], [facility]  Social Connections: Unknown (07/26/2023)  Tobacco Use: High Risk (07/26/2023)    Readmission Risk Interventions     No data to display

## 2023-07-31 NOTE — Discharge Summary (Addendum)
 Physician Discharge Summary   Patient: Cheryl Hall MRN: [id number] DOB: [birth year]  Admit date:     07/25/2023  Discharge date: 07/31/23  Discharge Physician: Arnetha Courser   PCP: Patrice Paradise, MD   Recommendations at discharge:  Please obtain CBC and BMP on follow-up Patient is being discharged with Foley catheter and need outpatient urology evaluation as she failed voiding trial with [place]. She is being discharged on 2 weeks of Lovenox for DVT prophylaxis Follow-up with primary care provider Follow-up with orthopedic surgery Follow-up with outpatient urology  Discharge Diagnoses: Principal Problem:   Closed displaced fracture of left femoral neck (HCC) Active Problems:   Acute respiratory failure with hypoxia (HCC)   Essential hypertension   Coronary artery disease involving native coronary artery without angina pectoris   Stage 3a chronic kidney disease (HCC)   Chronic back pain   Acute urinary retention   Hard of hearing   Protein-calorie malnutrition, severe   Hospital Course: 83 year old female past medical history of CAD, hypertension, GERD, CKD, chronic back pain.  She is followed by hospice as outpatient.  She had a fall and found to have a left hip fracture.  Patient is very hard of hearing.  2/28.  Dr. Audelia Acton to take to the operating room today.  Patient had a lot of pain this morning.  3/1: Vital stable, s/p left hip cemented hemiarthroplasty.  Hemoglobin decreased to 9.6 postoperatively, platelet of 107, slight worsening of creatinine to 1.25.  Pending PT and OT evaluation. Foley catheter to be removed today to give her a voiding trial.  3/3: Hemodynamically stable.  Awaiting SNF placement. Patient failed voiding trial so Foley catheter was replaced-need to see a urologist as outpatient. Orthopedic is recommending Lovenox for 2 weeks on discharge and outpatient follow-up.  Hemoglobin at 8.2  3/4: Hemodynamically stable, blood pressure elevated.  Hemoglobin  stable with small improvement to 8.4.  Anemia panel consistent with anemia of chronic disease with mild iron deficiency.  Increasing the dose of Coreg and also added low-dose losartan.  3/5: Remained hemodynamically stable.  Mild intermittent nausea without any vomiting.  Patient is being discharged to SNF for further rehab as recommended by our physical therapist.  Patient will continue following with hospice and they will take over once improved from current illness.  Patient will continue on current medications.  She was provided with 2 weeks of Lovenox for DVT prophylaxis.  As patient failed voiding trial and Foley catheter was replaced.  She need to see a outpatient urologist for further assistance.  She is being discharged with Foley catheter in place.  Patient also required 2 L of oxygen while sleeping.  Patient will continue on current medications and need to have a close follow-up with her providers for further assistance.  Assessment and Plan: * Closed displaced fracture of left femoral neck (HCC) S/p hemiarthroplasty.  Patient tolerated the procedure well,  Palliative care was consulted as recommended by hospice earlier but they would like hospice to take over her needs. Hemoglobin now stable at 8.4, anemia panel consistent with anemia of chronic disease and mild iron deficiency -Continue to monitor -Patient was started on supplement -Continue with supportive care and pain management -PT and OT are recommending SNF  Acute respiratory failure with hypoxia (HCC)  Chest x-ray read as negative.  Currently saturating well on 2 L -Try weaning oxygen as tolerated  Essential hypertension Blood pressure elevated. -Increasing the dose of Coreg to 6.25 mg twice daily -Add low-dose losartan -Continue  to monitor  Coronary artery disease involving native coronary artery without angina pectoris Continue Coreg.  Looks like she does not take aspirin or atorvastatin at home.  Stage 3a  chronic kidney disease (HCC) Creatinine today at 1.25. -Monitor renal function -Avoid nephrotoxins  Chronic back pain Looks like she takes oxycodone at home.  Acute urinary retention Unable to urinate before surgery so Foley catheter placed  Patient failed voiding trial after removal of Foley catheter after the surgery so it was replaced.  Patient need outpatient urology evaluation and is being discharged with Foley in place.  Protein-calorie malnutrition, severe Estimated body mass index is 17.48 kg/m as calculated from the following:   Height as of this encounter: 5\' 3"  (1.6 m).   Weight as of this encounter: 44.8 kg.   -Dietitian consult  Hard of hearing I had to yell in her ear in order for her to hear me.  Even with me yelling unsure if she is able to comprehend.      Pain control - [employer] Controlled Substance Reporting System database was reviewed. and patient was instructed, not to drive, operate heavy machinery, perform activities at heights, swimming or participation in water activities or provide baby-sitting services while on Pain, Sleep and Anxiety Medications; until their outpatient Physician has advised to do so again. Also recommended to not to take more than prescribed Pain, Sleep and Anxiety Medications.  Consultants: Orthopedic surgery Procedures performed: Hemiarthroplasty Disposition: Skilled nursing facility Diet recommendation:  Discharge Diet Orders (From admission, onward)     Start     Ordered   07/31/23 0000  Diet - low sodium heart healthy        07/31/23 1043           Regular diet DISCHARGE MEDICATION: Allergies as of 07/31/2023   No Known Allergies      Medication List     STOP taking these medications    aspirin EC 81 MG tablet   atorvastatin 80 MG tablet Commonly known as: LIPITOR   isosorbide mononitrate 30 MG 24 hr tablet Commonly known as: IMDUR   lidocaine 5 % Commonly known as: Lidoderm   polyethylene glycol  17 g packet Commonly known as: MIRALAX / GLYCOLAX       TAKE these medications    acetaminophen 500 MG tablet Commonly known as: TYLENOL Take 500 mg by mouth every 6 (six) hours as needed for mild pain (pain score 1-3) or headache.   bisacodyl 10 MG suppository Commonly known as: DULCOLAX Place 10 mg rectally 2 (two) times daily as needed.   calcium carbonate 500 MG chewable tablet Commonly known as: TUMS - dosed in mg elemental calcium Chew 2 tablets by mouth every 6 (six) hours as needed for indigestion or heartburn.   carvedilol 6.25 MG tablet Commonly known as: COREG Take 1 tablet (6.25 mg total) by mouth 2 (two) times daily with a meal. What changed:  medication strength See the new instructions.   enoxaparin 30 MG/0.3ML injection Commonly known as: LOVENOX Inject 0.3 mLs (30 mg total) into the skin daily for 14 days.   Fe Fum-Vit C-Vit B12-FA Caps capsule Commonly known as: TRIGELS-F FORTE Take 1 capsule by mouth 2 (two) times daily.   feeding supplement Liqd Take 237 mLs by mouth 3 (three) times daily between meals.   furosemide 20 MG tablet Commonly known as: LASIX Take 20 mg by mouth daily as needed for edema.   Geri-Lanta 200-200-20 MG/5ML suspension Generic drug: alum &  mag hydroxide-simeth Take 10 mLs by mouth every 4 (four) hours as needed for indigestion.   lactulose 10 GM/15ML solution Commonly known as: CHRONULAC Take 30 mLs (20 g total) by mouth 2 (two) times daily.   loperamide 2 MG tablet Commonly known as: IMODIUM A-D Take 2 mg by mouth as directed.   losartan 25 MG tablet Commonly known as: COZAAR Take 1 tablet (25 mg total) by mouth daily. Start taking on: August 01, 2023   Mucinex Maximum Strength 1200 MG Tb12 Generic drug: Guaifenesin Take 1 tablet by mouth 2 (two) times daily as needed.   multivitamin with minerals Tabs tablet Take 1 tablet by mouth daily. Start taking on: August 01, 2023   ondansetron 8 MG tablet Commonly known  as: ZOFRAN Take 4 mg by mouth every 6 (six) hours as needed for nausea.   oxyCODONE 5 MG immediate release tablet Commonly known as: Oxy IR/ROXICODONE Take 1 tablet (5 mg total) by mouth every 4 (four) hours as needed. What changed: Another medication with the same name was removed. Continue taking this medication, and follow the directions you see here.   pantoprazole 40 MG tablet Commonly known as: PROTONIX Take 1 tablet (40 mg total) by mouth daily. What changed: when to take this   potassium chloride 10 MEQ tablet Commonly known as: KLOR-CON Take 10 mEq by mouth daily as needed. Take one tablet daily with as needed lasix.   sennosides-docusate sodium 8.6-50 MG tablet Commonly known as: SENOKOT-S Take 2 tablets by mouth daily as needed for constipation.   sertraline 50 MG tablet Commonly known as: ZOLOFT Take 50 mg by mouth daily.        Contact information for follow-up providers     Evon Slack, PA-C Follow up in 2 week(s).   Specialties: Orthopedic Surgery, Emergency Medicine Contact information: [address] [place] [address] [phone]         Patrice Paradise, MD. Schedule an appointment as soon as possible for a visit in 1 week(s).   Specialty: Physician Assistant Contact information: [phone] [facility] [address] [facility] [place] [address] [phone]              Contact information for after-discharge care     Destination     [facility] Preferred SNF .   Service: Skilled Nursing Contact information: [address] [place] [address] [phone]                    Discharge Exam: Ceasar Mons Weights   07/25/23 2206  Weight: 44.8 kg   General.  Frail and malnourished elderly lady, in no acute distress.  Very hard of hearing Pulmonary.  Lungs clear bilaterally, normal respiratory effort. CV.  Regular rate and rhythm, no JVD, rub or murmur. Abdomen.  Soft,  nontender, nondistended, BS positive. CNS.  Alert and oriented .  No focal neurologic deficit. Extremities.  No edema, no cyanosis, pulses intact and symmetrical.   Condition at discharge: stable  The results of significant diagnostics from this hospitalization (including imaging, microbiology, ancillary and laboratory) are listed below for reference.   Imaging Studies: DG Pelvis Portable Result Date: 07/26/2023 CLINICAL DATA:  Elective surgery.  Left hip hemiarthroplasty. EXAM: PORTABLE PELVIS 1-2 VIEWS COMPARISON:  Preoperative radiograph FINDINGS: Frontal view of the pelvis submitted. Left hip arthroplasty in expected alignment. There is no periprosthetic lucency or fracture. Recent postsurgical change includes air and edema in the soft tissues and  joint space. The bones are subjectively under mineralized. IMPRESSION: Left hip arthroplasty. Electronically Signed   By: Narda Rutherford M.D.   On: 07/26/2023 18:36   DG Chest 1 View Result Date: 07/26/2023 CLINICAL DATA:  Recent fall with known left hip fracture, initial encounter EXAM: PORTABLE CHEST 1 VIEW COMPARISON:  09/29/2019 FINDINGS: Cardiac shadow is enlarged. Aortic calcifications are noted. The lungs are well aerated bilaterally. No focal infiltrate or effusion is seen. No bony abnormality is noted. IMPRESSION: No acute abnormality noted. Electronically Signed   By: Alcide Clever M.D.   On: 07/26/2023 00:15   DG HIP UNILAT WITH PELVIS 2-3 VIEWS LEFT Result Date: 07/26/2023 CLINICAL DATA:  Fall EXAM: DG HIP (WITH OR WITHOUT PELVIS) 2-3V LEFT COMPARISON:  CT abdomen and pelvis 08/26/2021. FINDINGS: There is an acute left femoral neck fracture with superolateral displacement of the distal fracture fragment. There is no dislocation. The bones are osteopenic. There is a healed left inferior pubic ramus fracture. There stable chronic compression deformity of L5. IMPRESSION: Acute left femoral neck fracture. Electronically Signed   By: Darliss Cheney M.D.   On: 07/26/2023 00:13    Microbiology: Results for orders placed or performed during the hospital encounter of 08/26/21  Resp Panel by RT-PCR (Flu A&B, Covid) Nasopharyngeal Swab     Status: None   Collection Time: 08/26/21  9:00 AM   Specimen: Nasopharyngeal Swab; Nasopharyngeal(NP) swabs in vial transport medium  Result Value Ref Range Status   SARS Coronavirus 2 by RT PCR NEGATIVE NEGATIVE Final    Comment: (NOTE) SARS-CoV-2 target nucleic acids are NOT DETECTED.  The SARS-CoV-2 RNA is generally detectable in upper respiratory specimens during the acute phase of infection. The lowest concentration of SARS-CoV-2 viral copies this assay can detect is 138 copies/mL. A negative result does not preclude SARS-Cov-2 infection and should not be used as the sole basis for treatment or other patient management decisions. A negative result may occur with  improper specimen collection/handling, submission of specimen other than nasopharyngeal swab, presence of viral mutation(s) within the areas targeted by this assay, and inadequate number of viral copies(<138 copies/mL). A negative result must be combined with clinical observations, patient history, and epidemiological information. The expected result is Negative.  Fact Sheet for Patients:  BloggerCourse.com  Fact Sheet for Healthcare Providers:  SeriousBroker.it  This test is no t yet approved or cleared by the [place] FDA and  has been authorized for detection and/or diagnosis of SARS-CoV-2 by FDA under an Emergency Use Authorization (EUA). This EUA will remain  in effect (meaning this test can be used) for the duration of the COVID-19 declaration under Section 564(b)(1) of the Act, 21 U.S.C.section 360bbb-3(b)(1), unless the authorization is terminated  or revoked sooner.       Influenza A by PCR NEGATIVE NEGATIVE Final   Influenza B by PCR NEGATIVE NEGATIVE  Final    Comment: (NOTE) The Xpert Xpress SARS-CoV-2/FLU/RSV plus assay is intended as an aid in the diagnosis of influenza from Nasopharyngeal swab specimens and should not be used as a sole basis for treatment. Nasal washings and aspirates are unacceptable for Xpert Xpress SARS-CoV-2/FLU/RSV testing.  Fact Sheet for Patients: BloggerCourse.com  Fact Sheet for Healthcare Providers: SeriousBroker.it  This test is not yet approved or cleared by the [place] FDA and has been authorized for detection and/or diagnosis of SARS-CoV-2 by FDA under an Emergency Use Authorization (EUA). This EUA will remain in effect (meaning this test can be used) for  the duration of the COVID-19 declaration under Section 564(b)(1) of the Act, 21 U.S.C. section 360bbb-3(b)(1), unless the authorization is terminated or revoked.  Performed at [facility], [address] [place], [place] [address]     Labs: CBC: Recent Labs  Lab 07/25/23 2341 07/27/23 0559 07/28/23 0434 07/29/23 0132 07/30/23 0505  WBC 5.5 7.7 7.4 5.8 5.0  HGB 10.5* 9.6* 8.6* 8.2* 8.4*  HCT 34.1* 30.0* 27.2* 26.0* 26.6*  MCV 99.1 94.0 96.1 97.0 95.3  PLT 151 107* 98* 100* 115*   Basic Metabolic Panel: Recent Labs  Lab 07/25/23 2341 07/27/23 0559 07/28/23 0434 07/29/23 0132  NA 139 137 136 137  K 4.2 5.1 4.2 4.3  CL 103 105 104 103  CO2 27 24 27 27   GLUCOSE 96 90 79 76  BUN 16 23 30* 30*  CREATININE 1.04* 1.25* 1.08* 0.88  CALCIUM 9.0 8.6* 8.4* 8.4*   Liver Function Tests: Recent Labs  Lab 07/25/23 2341  AST 25  ALT 15  ALKPHOS 82  BILITOT 0.6  PROT 6.5  ALBUMIN 3.7   CBG: No results for input(s): "GLUCAP" in the last 168 hours.  Discharge time spent: greater than 30 minutes.  This record has been created using Conservation officer, historic buildings. Errors have been sought and corrected,but may not always be located. Such creation errors do not  reflect on the standard of care.   Signed: Arnetha Courser, MD [facility] 07/31/2023

## 2023-07-31 NOTE — Progress Notes (Signed)
 Called [facility] and spoke to Nurse Marisue Humble, report was given. Pt showing no signs of distress or pain at this time. Will continue to monitor.

## 2023-08-01 DIAGNOSIS — S72002D Fracture of unspecified part of neck of left femur, subsequent encounter for closed fracture with routine healing: Secondary | ICD-10-CM | POA: Diagnosis not present

## 2023-08-05 DIAGNOSIS — Z515 Encounter for palliative care: Secondary | ICD-10-CM | POA: Diagnosis not present

## 2023-08-05 DIAGNOSIS — J9602 Acute respiratory failure with hypercapnia: Secondary | ICD-10-CM | POA: Diagnosis not present

## 2023-08-05 DIAGNOSIS — D649 Anemia, unspecified: Secondary | ICD-10-CM | POA: Diagnosis not present

## 2023-08-05 DIAGNOSIS — S72002D Fracture of unspecified part of neck of left femur, subsequent encounter for closed fracture with routine healing: Secondary | ICD-10-CM | POA: Diagnosis not present

## 2023-08-08 DIAGNOSIS — R197 Diarrhea, unspecified: Secondary | ICD-10-CM | POA: Diagnosis not present

## 2023-08-08 DIAGNOSIS — S72002D Fracture of unspecified part of neck of left femur, subsequent encounter for closed fracture with routine healing: Secondary | ICD-10-CM | POA: Diagnosis not present

## 2023-08-12 DIAGNOSIS — J9602 Acute respiratory failure with hypercapnia: Secondary | ICD-10-CM | POA: Diagnosis not present

## 2023-08-12 DIAGNOSIS — S72002D Fracture of unspecified part of neck of left femur, subsequent encounter for closed fracture with routine healing: Secondary | ICD-10-CM | POA: Diagnosis not present

## 2023-08-12 DIAGNOSIS — R197 Diarrhea, unspecified: Secondary | ICD-10-CM | POA: Diagnosis not present

## 2023-08-13 DIAGNOSIS — M6281 Muscle weakness (generalized): Secondary | ICD-10-CM | POA: Diagnosis not present

## 2023-08-13 DIAGNOSIS — S51011A Laceration without foreign body of right elbow, initial encounter: Secondary | ICD-10-CM | POA: Diagnosis not present

## 2023-08-14 DIAGNOSIS — R52 Pain, unspecified: Secondary | ICD-10-CM | POA: Diagnosis not present

## 2023-08-14 DIAGNOSIS — S72002D Fracture of unspecified part of neck of left femur, subsequent encounter for closed fracture with routine healing: Secondary | ICD-10-CM | POA: Diagnosis not present

## 2023-08-14 DIAGNOSIS — I10 Essential (primary) hypertension: Secondary | ICD-10-CM | POA: Diagnosis not present

## 2023-08-15 DIAGNOSIS — S72002D Fracture of unspecified part of neck of left femur, subsequent encounter for closed fracture with routine healing: Secondary | ICD-10-CM | POA: Diagnosis not present

## 2023-08-15 DIAGNOSIS — Z515 Encounter for palliative care: Secondary | ICD-10-CM | POA: Diagnosis not present

## 2023-08-15 DIAGNOSIS — R52 Pain, unspecified: Secondary | ICD-10-CM | POA: Diagnosis not present

## 2023-08-16 DIAGNOSIS — Z96642 Presence of left artificial hip joint: Secondary | ICD-10-CM | POA: Diagnosis not present

## 2023-08-19 DIAGNOSIS — J9602 Acute respiratory failure with hypercapnia: Secondary | ICD-10-CM | POA: Diagnosis not present

## 2023-08-19 DIAGNOSIS — S72002D Fracture of unspecified part of neck of left femur, subsequent encounter for closed fracture with routine healing: Secondary | ICD-10-CM | POA: Diagnosis not present

## 2023-08-22 DIAGNOSIS — Z515 Encounter for palliative care: Secondary | ICD-10-CM | POA: Diagnosis not present

## 2023-08-23 DIAGNOSIS — R52 Pain, unspecified: Secondary | ICD-10-CM | POA: Diagnosis not present

## 2023-08-23 DIAGNOSIS — J9602 Acute respiratory failure with hypercapnia: Secondary | ICD-10-CM | POA: Diagnosis not present

## 2023-08-23 DIAGNOSIS — I10 Essential (primary) hypertension: Secondary | ICD-10-CM | POA: Diagnosis not present

## 2023-08-23 DIAGNOSIS — S72002D Fracture of unspecified part of neck of left femur, subsequent encounter for closed fracture with routine healing: Secondary | ICD-10-CM | POA: Diagnosis not present

## 2024-01-10 DIAGNOSIS — H6061 Unspecified chronic otitis externa, right ear: Secondary | ICD-10-CM | POA: Diagnosis not present

## 2024-01-10 DIAGNOSIS — H903 Sensorineural hearing loss, bilateral: Secondary | ICD-10-CM | POA: Diagnosis not present

## 2024-01-10 DIAGNOSIS — H6123 Impacted cerumen, bilateral: Secondary | ICD-10-CM | POA: Diagnosis not present

## 2024-01-12 IMAGING — US US ART/VEN ABD/PELV/SCROTUM DOPPLER LTD
1 series · 13 of 25 positions shown · non-contrast
Comparison: 02/25/2008 ultrasound.  06/19/2021 and prior CTs

CLINICAL DATA: 80-year-old female with pelvic pain for 1 week.



[Series 1: us pelvic complete w transvaginal and torsion righ · 40 acquisitions, 13 frames shown]
[im 1/40]
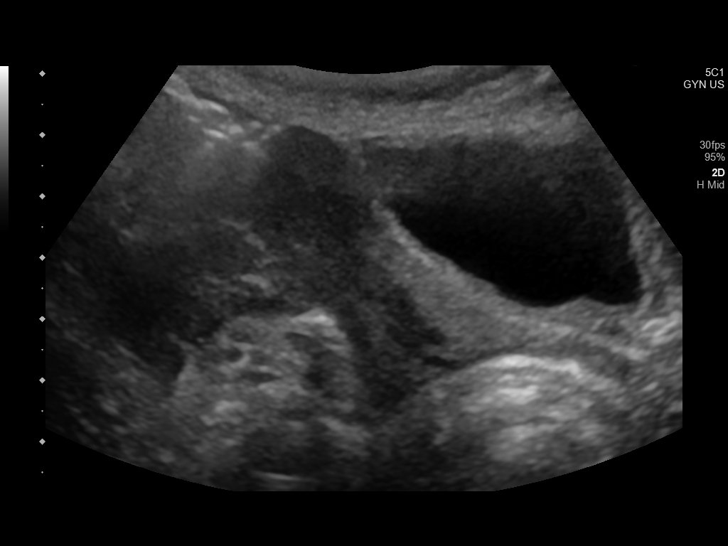
[im 4/40]
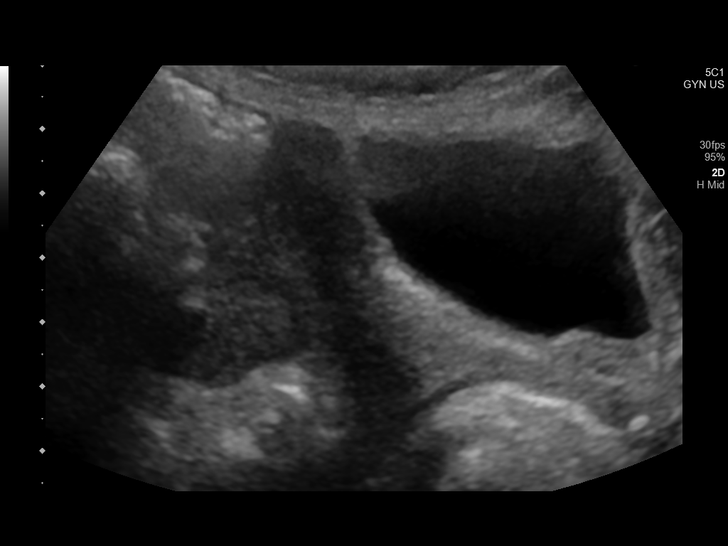
[im 7/40]
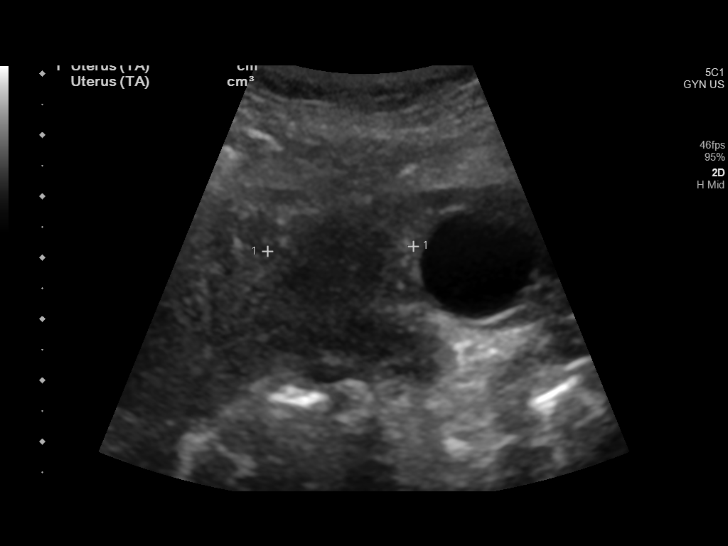
[im 10/40]
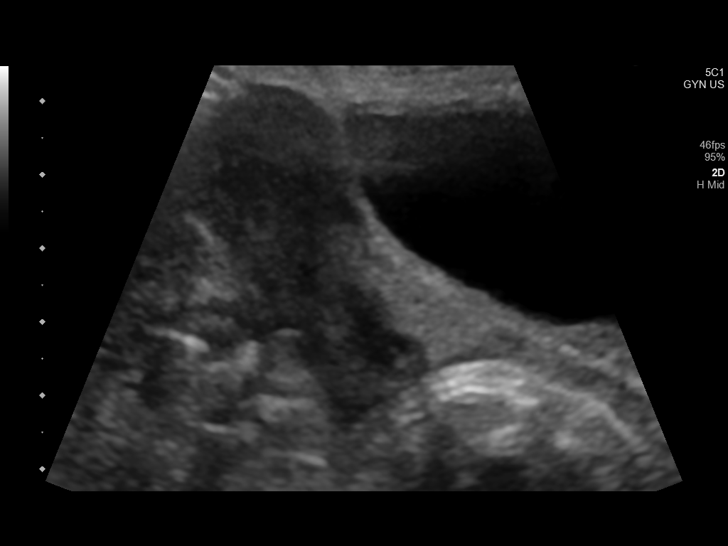
[im 14/40]
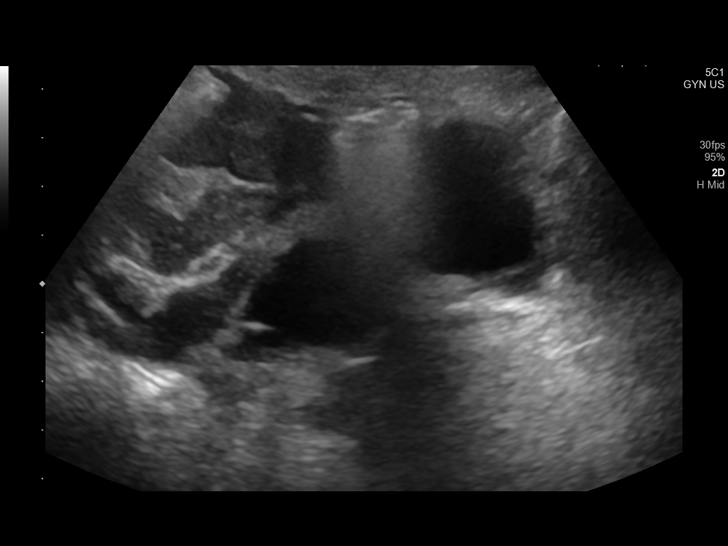
[im 17/40]
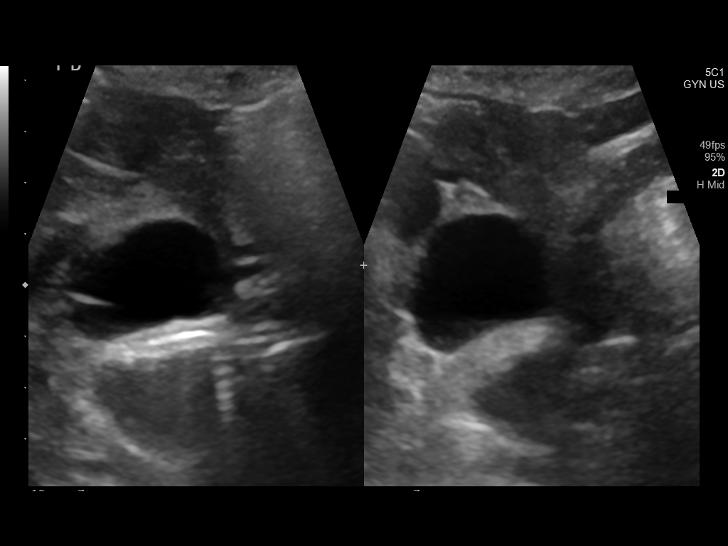
[im 20/40]
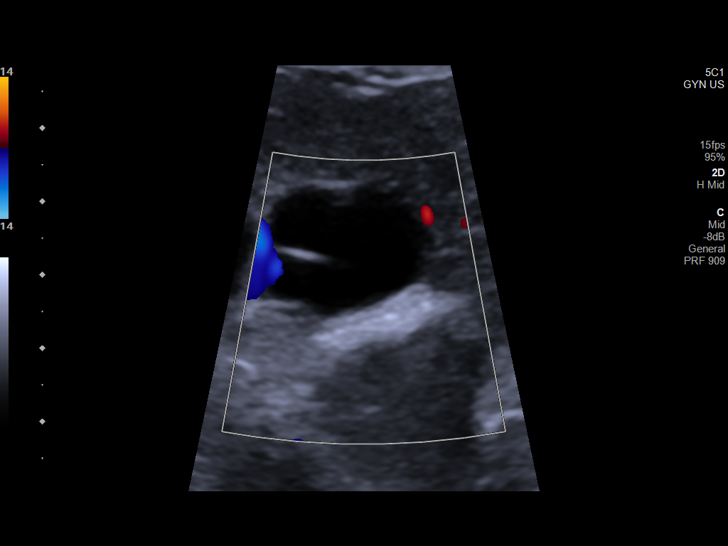
[im 23/40]
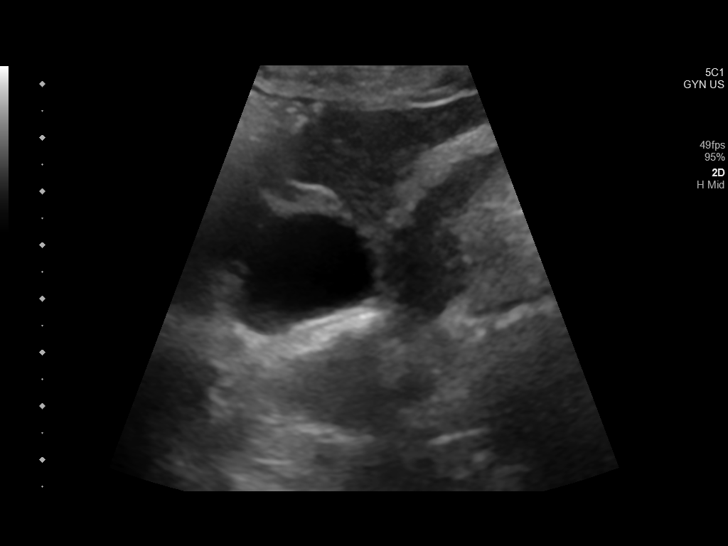
[im 27/40]
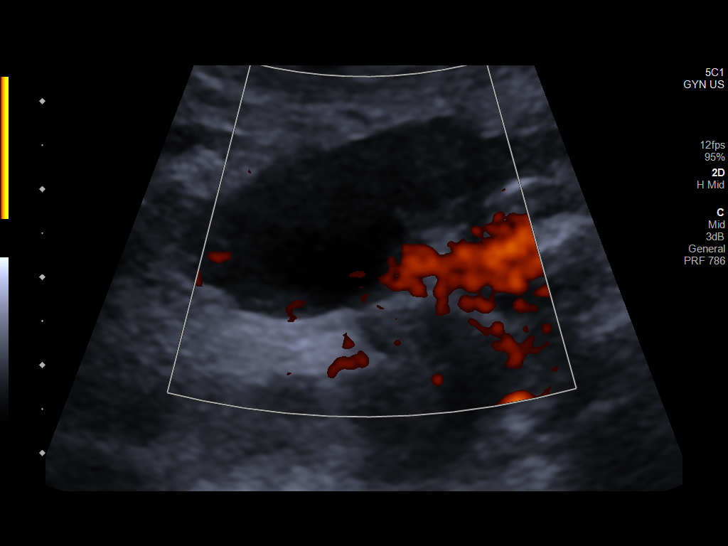
[im 30/40]
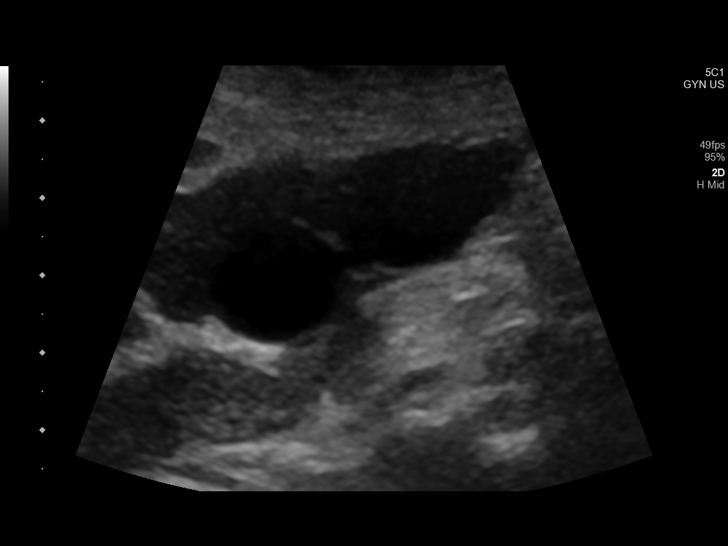
[im 33/40]
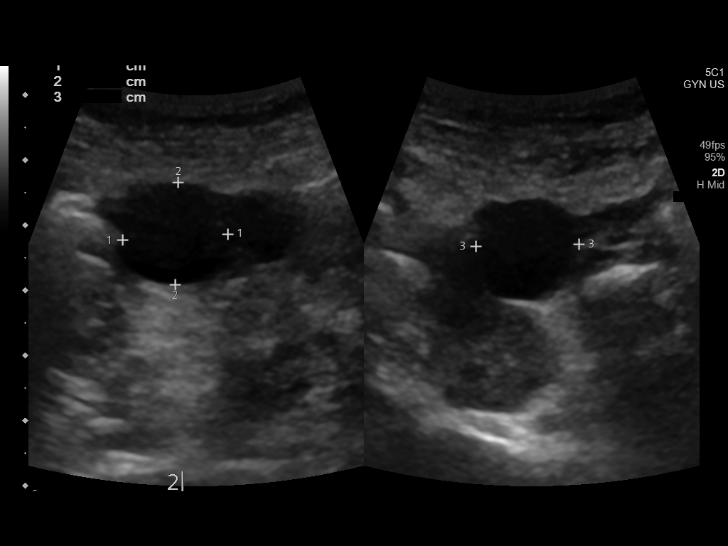
[im 36/40]
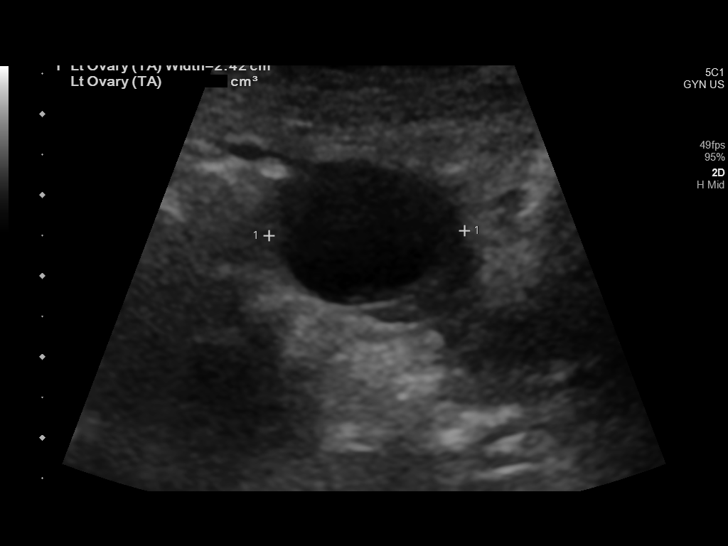
[im 40/40]
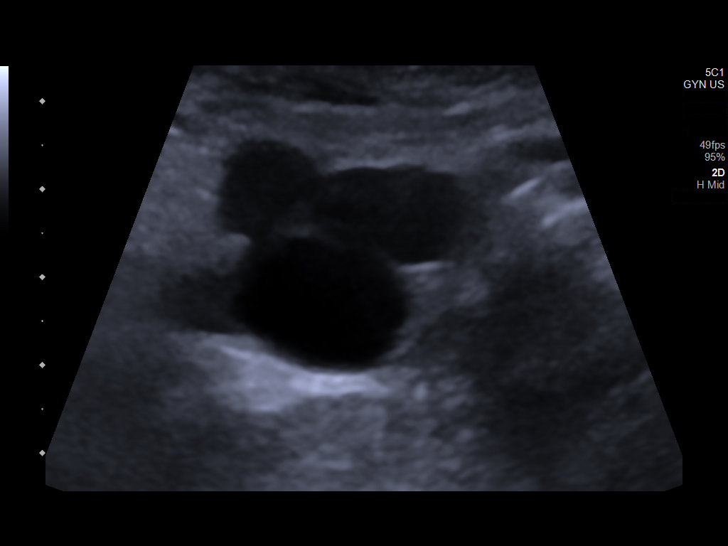

[13 of 25 positions shown; findings below may reference images not displayed]

FINDINGS: Uterus

Measurements: 4.7 x 2.0 x 2.4 cm = volume: 11.8 mL. No fibroids or
other mass visualized.

Endometrium

Thickness: 3.1 mm.  No focal abnormality visualized.

Right ovary

Measurements: 3.6 x 2.4 x 2.8 cm = volume: 12.7 mL. Simple appearing
cysts are present and no imaging follow-up recommended.

Left ovary

Measurements: 3.8 x 1.7 x 2.4 cm = volume: 8 mL. Small simple
appearing cysts are present and no imaging follow-up recommended.

Pulsed Doppler evaluation of both ovaries demonstrates normal
low-resistance arterial waveforms and color Doppler flow. Venous
waveforms were not able to be obtained, likely technically related.

Other findings

No abnormal free fluid.
IMPRESSION: 1. Normal arterial waveforms and color Doppler flow within both
ovaries and without morphologic changes of ovarian torsion. Venous
waveforms were not able to be obtained, likely technical.
2. No acute or significant abnormalities.

## 2024-01-12 IMAGING — US US PELVIS COMPLETE
1 series · 13 of 25 positions shown · non-contrast
Comparison: 02/25/2008 ultrasound.  06/19/2021 and prior CTs

CLINICAL DATA: 80-year-old female with pelvic pain for 1 week.



[Series 1: us pelvic complete w transvaginal and torsion righ · 40 acquisitions, 13 frames shown]
[im 1/40]
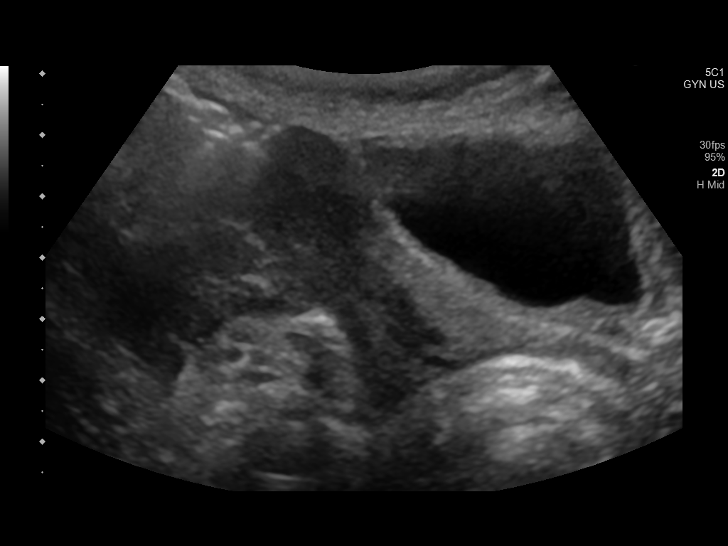
[im 4/40]
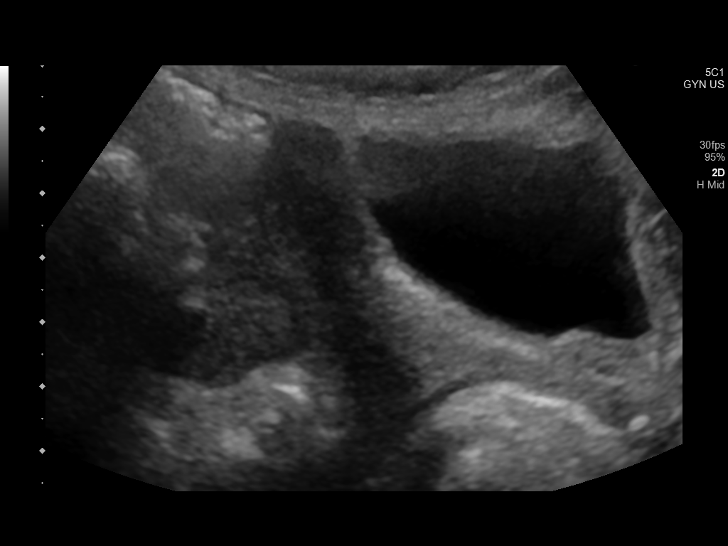
[im 7/40]
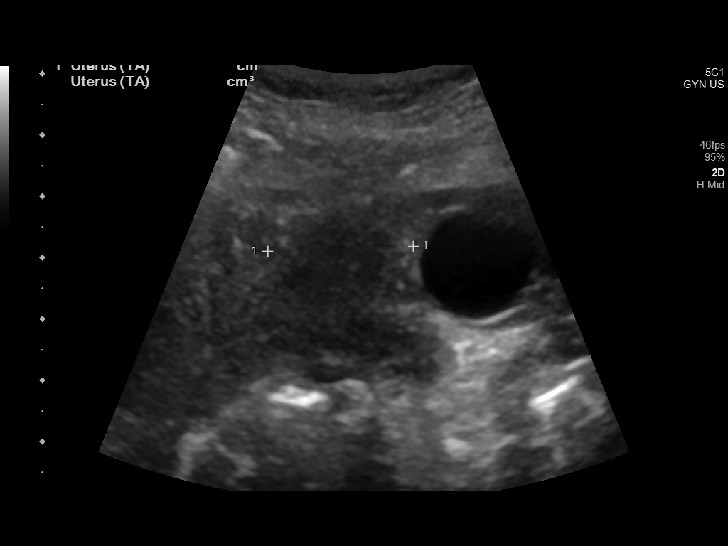
[im 10/40]
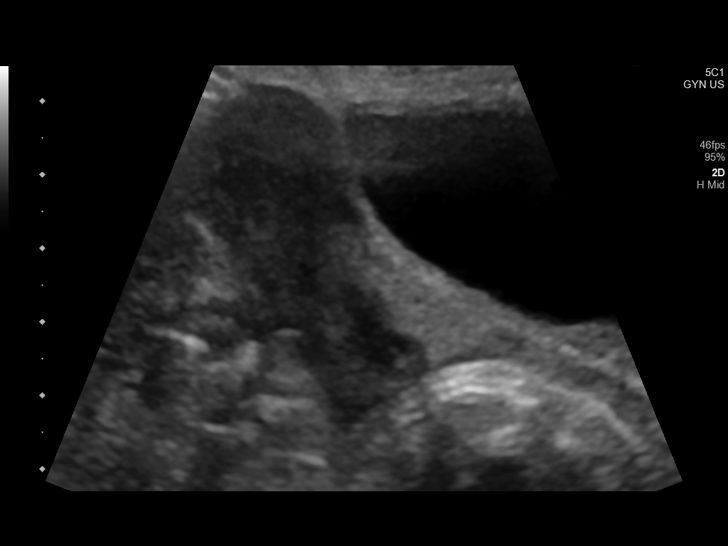
[im 14/40]
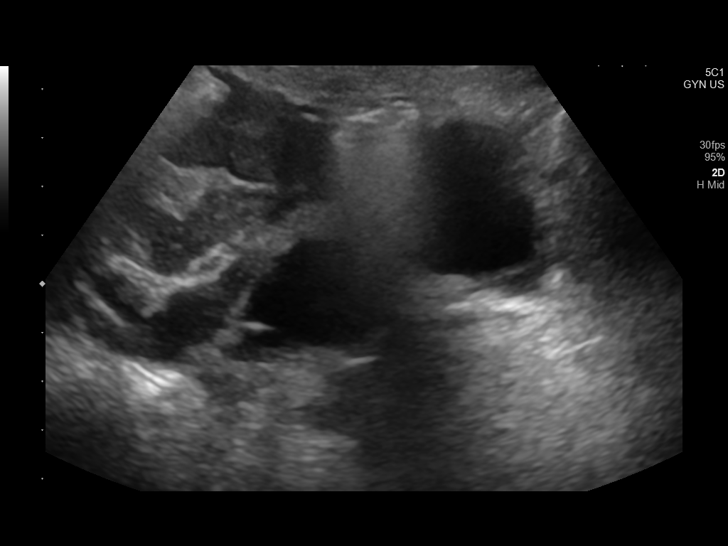
[im 17/40]
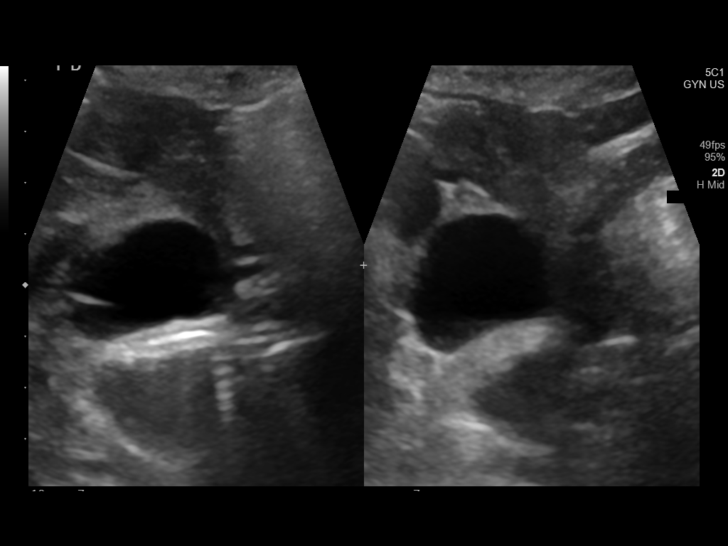
[im 20/40]
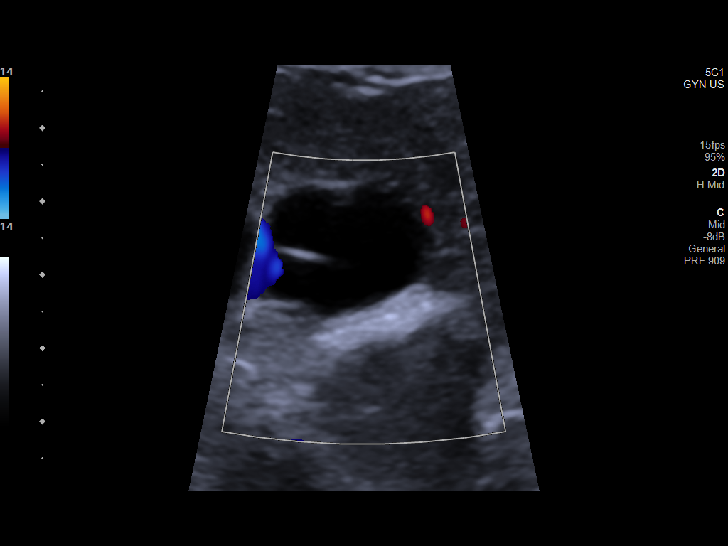
[im 23/40]
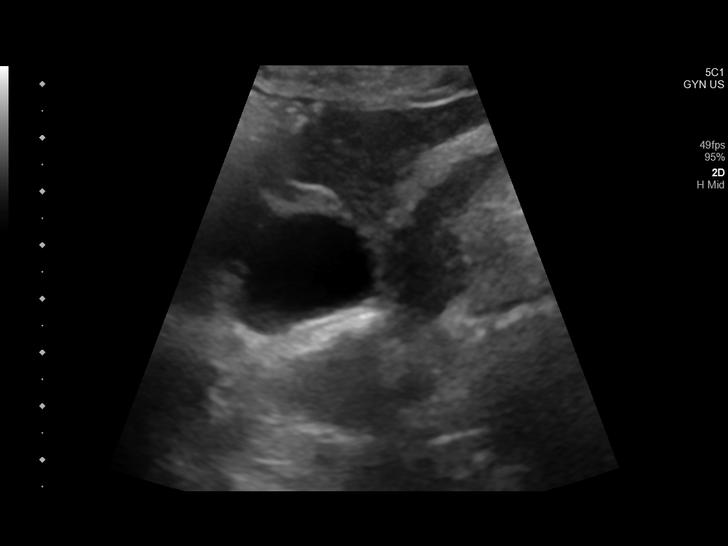
[im 27/40]
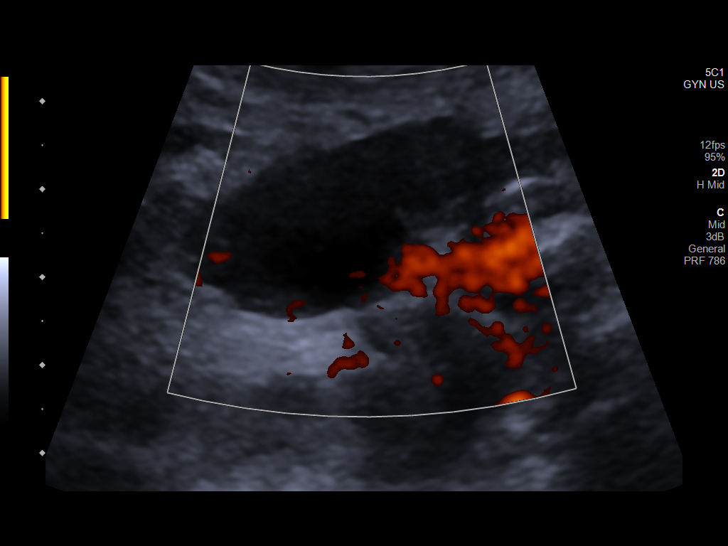
[im 30/40]
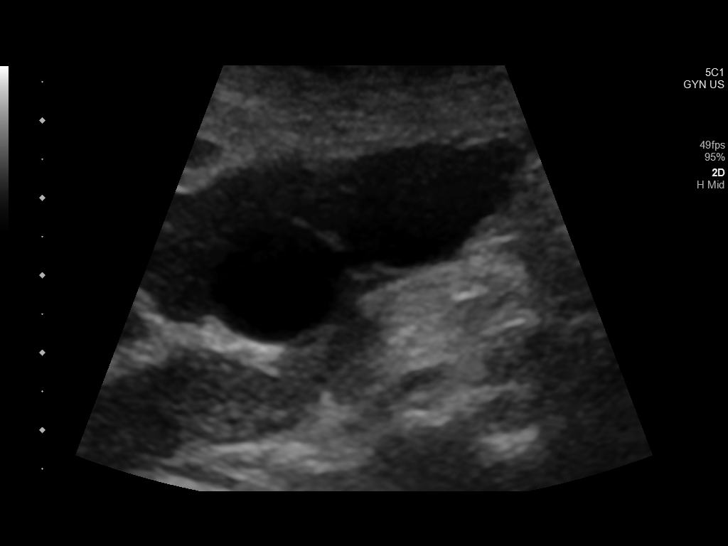
[im 33/40]
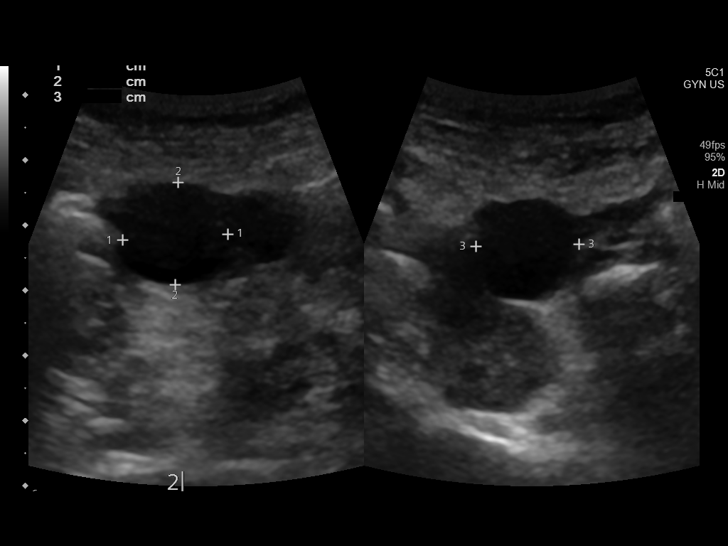
[im 36/40]
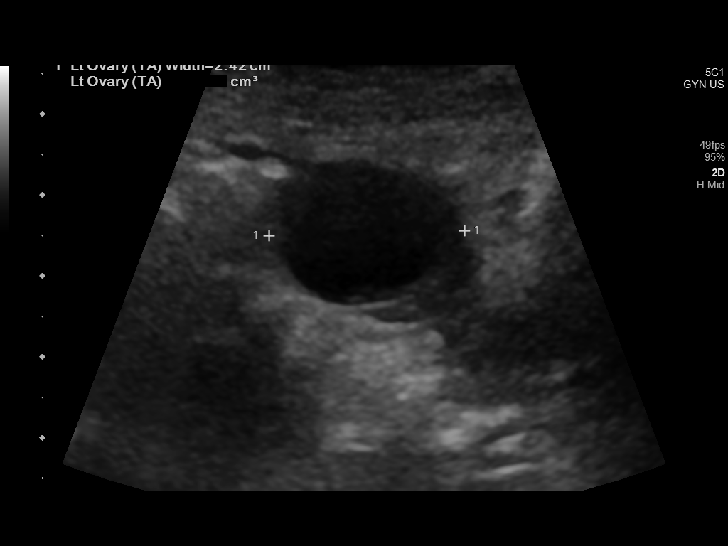
[im 40/40]
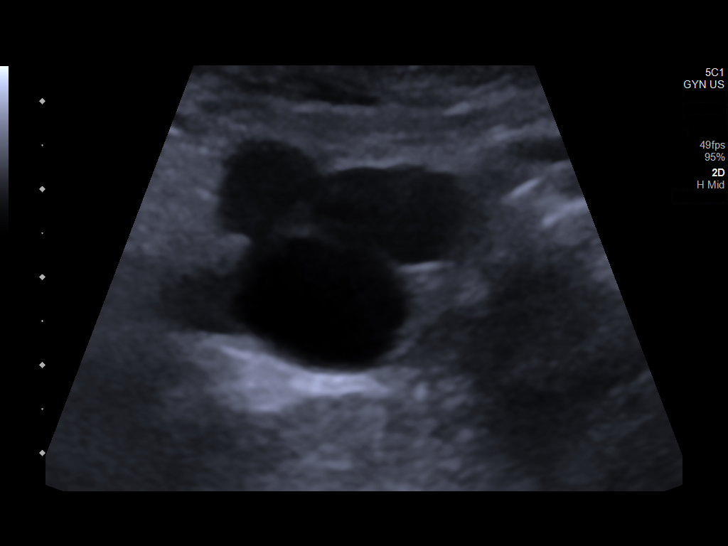

[13 of 25 positions shown; findings below may reference images not displayed]

FINDINGS: Uterus

Measurements: 4.7 x 2.0 x 2.4 cm = volume: 11.8 mL. No fibroids or
other mass visualized.

Endometrium

Thickness: 3.1 mm.  No focal abnormality visualized.

Right ovary

Measurements: 3.6 x 2.4 x 2.8 cm = volume: 12.7 mL. Simple appearing
cysts are present and no imaging follow-up recommended.

Left ovary

Measurements: 3.8 x 1.7 x 2.4 cm = volume: 8 mL. Small simple
appearing cysts are present and no imaging follow-up recommended.

Pulsed Doppler evaluation of both ovaries demonstrates normal
low-resistance arterial waveforms and color Doppler flow. Venous
waveforms were not able to be obtained, likely technically related.

Other findings

No abnormal free fluid.
IMPRESSION: 1. Normal arterial waveforms and color Doppler flow within both
ovaries and without morphologic changes of ovarian torsion. Venous
waveforms were not able to be obtained, likely technical.
2. No acute or significant abnormalities.
